# Patient Record
Sex: Male | Born: 1945 | Race: White | Hispanic: No | Marital: Married | Smoking: Former smoker
Health system: Southern US, Community
[De-identification: ages and names within clinical notes are randomized; demographics above are authoritative.]

## PROBLEM LIST (undated history)

## (undated) DIAGNOSIS — H409 Unspecified glaucoma: Secondary | ICD-10-CM

## (undated) DIAGNOSIS — Z5189 Encounter for other specified aftercare: Secondary | ICD-10-CM

## (undated) DIAGNOSIS — H9191 Unspecified hearing loss, right ear: Secondary | ICD-10-CM

## (undated) DIAGNOSIS — K219 Gastro-esophageal reflux disease without esophagitis: Secondary | ICD-10-CM

## (undated) DIAGNOSIS — M1990 Unspecified osteoarthritis, unspecified site: Secondary | ICD-10-CM

## (undated) DIAGNOSIS — M069 Rheumatoid arthritis, unspecified: Secondary | ICD-10-CM

## (undated) DIAGNOSIS — R413 Other amnesia: Secondary | ICD-10-CM

## (undated) DIAGNOSIS — W3400XA Accidental discharge from unspecified firearms or gun, initial encounter: Secondary | ICD-10-CM

## (undated) DIAGNOSIS — I10 Essential (primary) hypertension: Secondary | ICD-10-CM

## (undated) DIAGNOSIS — F419 Anxiety disorder, unspecified: Secondary | ICD-10-CM

## (undated) DIAGNOSIS — J61 Pneumoconiosis due to asbestos and other mineral fibers: Secondary | ICD-10-CM

## (undated) DIAGNOSIS — A150 Tuberculosis of lung: Secondary | ICD-10-CM

## (undated) DIAGNOSIS — E785 Hyperlipidemia, unspecified: Secondary | ICD-10-CM

## (undated) DIAGNOSIS — J45909 Unspecified asthma, uncomplicated: Secondary | ICD-10-CM

## (undated) DIAGNOSIS — J8410 Pulmonary fibrosis, unspecified: Secondary | ICD-10-CM

## (undated) DIAGNOSIS — C61 Malignant neoplasm of prostate: Secondary | ICD-10-CM

## (undated) HISTORY — DX: Unspecified asthma, uncomplicated: J45.909

## (undated) HISTORY — DX: Unspecified glaucoma: H40.9

## (undated) HISTORY — DX: Hyperlipidemia, unspecified: E78.5

## (undated) HISTORY — DX: Gastro-esophageal reflux disease without esophagitis: K21.9

## (undated) HISTORY — PX: PROSTATE BIOPSY: SHX241

## (undated) HISTORY — DX: Unspecified osteoarthritis, unspecified site: M19.90

## (undated) HISTORY — PX: TONSILLECTOMY: SUR1361

## (undated) HISTORY — DX: Malignant neoplasm of prostate: C61

## (undated) HISTORY — PX: ABDOMINAL SURGERY: SHX537

## (undated) HISTORY — DX: Encounter for other specified aftercare: Z51.89

## (undated) HISTORY — DX: Anxiety disorder, unspecified: F41.9

---

## 2006-04-25 DIAGNOSIS — A150 Tuberculosis of lung: Secondary | ICD-10-CM

## 2006-04-25 HISTORY — DX: Tuberculosis of lung: A15.0

## 2013-09-18 ENCOUNTER — Emergency Department (HOSPITAL_BASED_OUTPATIENT_CLINIC_OR_DEPARTMENT_OTHER)
Admission: EM | Admit: 2013-09-18 | Discharge: 2013-09-18 | Disposition: A | Discharge status: Home / Self Care | Payer: Medicare HMO | Attending: Emergency Medicine | Admitting: Emergency Medicine

## 2013-09-18 ENCOUNTER — Emergency Department (HOSPITAL_BASED_OUTPATIENT_CLINIC_OR_DEPARTMENT_OTHER): Payer: Medicare HMO

## 2013-09-18 ENCOUNTER — Encounter (HOSPITAL_BASED_OUTPATIENT_CLINIC_OR_DEPARTMENT_OTHER): Payer: Self-pay | Admitting: Emergency Medicine

## 2013-09-18 DIAGNOSIS — Z8709 Personal history of other diseases of the respiratory system: Secondary | ICD-10-CM | POA: Insufficient documentation

## 2013-09-18 DIAGNOSIS — Z8611 Personal history of tuberculosis: Secondary | ICD-10-CM | POA: Insufficient documentation

## 2013-09-18 DIAGNOSIS — I10 Essential (primary) hypertension: Secondary | ICD-10-CM

## 2013-09-18 DIAGNOSIS — H348122 Central retinal vein occlusion, left eye, stable: Secondary | ICD-10-CM

## 2013-09-18 DIAGNOSIS — H348192 Central retinal vein occlusion, unspecified eye, stable: Secondary | ICD-10-CM | POA: Insufficient documentation

## 2013-09-18 DIAGNOSIS — R0602 Shortness of breath: Secondary | ICD-10-CM | POA: Insufficient documentation

## 2013-09-18 DIAGNOSIS — Z87828 Personal history of other (healed) physical injury and trauma: Secondary | ICD-10-CM | POA: Insufficient documentation

## 2013-09-18 DIAGNOSIS — H547 Unspecified visual loss: Secondary | ICD-10-CM | POA: Insufficient documentation

## 2013-09-18 DIAGNOSIS — Z87891 Personal history of nicotine dependence: Secondary | ICD-10-CM | POA: Insufficient documentation

## 2013-09-18 DIAGNOSIS — H9190 Unspecified hearing loss, unspecified ear: Secondary | ICD-10-CM | POA: Insufficient documentation

## 2013-09-18 DIAGNOSIS — R51 Headache: Secondary | ICD-10-CM | POA: Insufficient documentation

## 2013-09-18 HISTORY — DX: Pneumoconiosis due to asbestos and other mineral fibers: J61

## 2013-09-18 HISTORY — DX: Essential (primary) hypertension: I10

## 2013-09-18 HISTORY — DX: Accidental discharge from unspecified firearms or gun, initial encounter: W34.00XA

## 2013-09-18 HISTORY — DX: Tuberculosis of lung: A15.0

## 2013-09-18 HISTORY — DX: Unspecified hearing loss, right ear: H91.91

## 2013-09-18 LAB — BASIC METABOLIC PANEL
BUN: 24 mg/dL — ABNORMAL HIGH (ref 6–23)
CALCIUM: 9.8 mg/dL (ref 8.4–10.5)
CO2: 24 mEq/L (ref 19–32)
Chloride: 101 mEq/L (ref 96–112)
Creatinine, Ser: 1.5 mg/dL — ABNORMAL HIGH (ref 0.50–1.35)
GFR calc Af Amer: 53 mL/min — ABNORMAL LOW (ref >90)
GFR calc non Af Amer: 46 mL/min — ABNORMAL LOW (ref >90)
Glucose, Bld: 100 mg/dL — ABNORMAL HIGH (ref 70–99)
Potassium: 3.6 mEq/L — ABNORMAL LOW (ref 3.7–5.3)
Sodium: 142 mEq/L (ref 137–147)

## 2013-09-18 LAB — CBC WITH DIFFERENTIAL/PLATELET
BASOS ABS: 0.1 10*3/uL (ref 0.0–0.1)
BASOS PCT: 1 % (ref 0–1)
Eosinophils Absolute: 0.4 10*3/uL (ref 0.0–0.7)
Eosinophils Relative: 4 % (ref 0–5)
HEMATOCRIT: 39.4 % (ref 39.0–52.0)
Hemoglobin: 13.9 g/dL (ref 13.0–17.0)
Lymphocytes Relative: 26 % (ref 12–46)
Lymphs Abs: 2.8 10*3/uL (ref 0.7–4.0)
MCH: 29.9 pg (ref 26.0–34.0)
MCHC: 35.3 g/dL (ref 30.0–36.0)
MCV: 84.7 fL (ref 78.0–100.0)
Monocytes Absolute: 0.8 10*3/uL (ref 0.1–1.0)
Monocytes Relative: 7 % (ref 3–12)
Neutro Abs: 6.9 10*3/uL (ref 1.7–7.7)
Neutrophils Relative %: 63 % (ref 43–77)
Platelets: 285 10*3/uL (ref 150–400)
RBC: 4.65 MIL/uL (ref 4.22–5.81)
RDW: 14.3 % (ref 11.5–15.5)
WBC: 10.9 10*3/uL — ABNORMAL HIGH (ref 4.0–10.5)

## 2013-09-18 MED ORDER — LISINOPRIL 10 MG PO TABS: 10.0000 mg | ORAL_TABLET | Freq: Every day | ORAL | Status: DC

## 2013-09-18 MED ORDER — LISINOPRIL 10 MG PO TABS
10.0000 mg | ORAL_TABLET | Freq: Once | ORAL | Status: AC
  Administered 2013-09-18: 10 mg via ORAL
  Filled 2013-09-18: qty 1

## 2013-09-18 MED ORDER — AMLODIPINE BESYLATE 10 MG PO TABS: 10.0000 mg | ORAL_TABLET | Freq: Every day | ORAL | Status: DC

## 2013-09-18 MED ORDER — TIMOLOL MALEATE 0.5 % OP SOLN
1.0000 [drp] | Freq: Two times a day (BID) | OPHTHALMIC | Status: DC
  Administered 2013-09-18: 1 [drp] via OPHTHALMIC
  Filled 2013-09-18: qty 5

## 2013-09-18 MED ORDER — TETRACAINE HCL 0.5 % OP SOLN
OPHTHALMIC | Status: DC
Start: 2013-09-18 — End: 2013-09-19
  Filled 2013-09-18: qty 2

## 2013-09-18 MED ORDER — AMLODIPINE BESYLATE 5 MG PO TABS
5.0000 mg | ORAL_TABLET | Freq: Once | ORAL | Status: AC
  Administered 2013-09-18: 5 mg via ORAL
  Filled 2013-09-18: qty 1
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
## 2013-09-18 NOTE — Discharge Instructions (Signed)
Arterial Hypertension °Arterial hypertension (high blood pressure) is a condition of elevated pressure in your blood vessels. Hypertension over a long period of time is a risk factor for strokes, heart attacks, and heart failure. It is also the leading cause of kidney (renal) failure.  °CAUSES  °· In Adults -- Over 90% of all hypertension has no known cause. This is called essential or primary hypertension. In the other 10% of people with hypertension, the increase in blood pressure is caused by another disorder. This is called secondary hypertension. Important causes of secondary hypertension are: °· Heavy alcohol use. °· Obstructive sleep apnea. °· Hyperaldosterosim (Conn's syndrome). °· Steroid use. °· Chronic kidney failure. °· Hyperparathyroidism. °· Medications. °· Renal artery stenosis. °· Pheochromocytoma. °· Cushing's disease. °· Coarctation of the aorta. °· Scleroderma renal crisis. °· Licorice (in excessive amounts). °· Drugs (cocaine, methamphetamine). °Your caregiver can explain any items above that apply to you. °· In Children -- Secondary hypertension is more common and should always be considered. °· Pregnancy -- Few women of childbearing age have high blood pressure. However, up to 10% of them develop hypertension of pregnancy. Generally, this will not harm the woman. It may be a sign of 3 complications of pregnancy: preeclampsia, HELLP syndrome, and eclampsia. Follow up and control with medication is necessary. °SYMPTOMS  °· This condition normally does not produce any noticeable symptoms. It is usually found during a routine exam. °· Malignant hypertension is a late problem of high blood pressure. It may have the following symptoms: °· Headaches. °· Blurred vision. °· End-organ damage (this means your kidneys, heart, lungs, and other organs are being damaged). °· Stressful situations can increase the blood pressure. If a person with normal blood pressure has their blood pressure go up while being  seen by their caregiver, this is often termed "white coat hypertension." Its importance is not known. It may be related with eventually developing hypertension or complications of hypertension. °· Hypertension is often confused with mental tension, stress, and anxiety. °DIAGNOSIS  °The diagnosis is made by 3 separate blood pressure measurements. They are taken at least 1 week apart from each other. If there is organ damage from hypertension, the diagnosis may be made without repeat measurements. °Hypertension is usually identified by having blood pressure readings: °· Above 140/90 mmHg measured in both arms, at 3 separate times, over a couple weeks. °· Over 130/80 mmHg should be considered a risk factor and may require treatment in patients with diabetes. °Blood pressure readings over 120/80 mmHg are called "pre-hypertension" even in non-diabetic patients. °To get a true blood pressure measurement, use the following guidelines. Be aware of the factors that can alter blood pressure readings. °· Take measurements at least 1 hour after caffeine. °· Take measurements 30 minutes after smoking and without any stress. This is another reason to quit smoking  it raises your blood pressure. °· Use a proper cuff size. Ask your caregiver if you are not sure about your cuff size. °· Most home blood pressure cuffs are automatic. They will measure systolic and diastolic pressures. The systolic pressure is the pressure reading at the start of sounds. Diastolic pressure is the pressure at which the sounds disappear. If you are elderly, measure pressures in multiple postures. Try sitting, lying or standing. °· Sit at rest for a minimum of 5 minutes before taking measurements. °· You should not be on any medications like decongestants. These are found in many cold medications. °· Record your blood pressure readings and review   them with your caregiver. °If you have hypertension: °· Your caregiver may do tests to be sure you do not have  secondary hypertension (see "causes" above). °· Your caregiver may also look for signs of metabolic syndrome. This is also called Syndrome X or Insulin Resistance Syndrome. You may have this syndrome if you have type 2 diabetes, abdominal obesity, and abnormal blood lipids in addition to hypertension. °· Your caregiver will take your medical and family history and perform a physical exam. °· Diagnostic tests may include blood tests (for glucose, cholesterol, potassium, and kidney function), a urinalysis, or an EKG. Other tests may also be necessary depending on your condition. °PREVENTION  °There are important lifestyle issues that you can adopt to reduce your chance of developing hypertension: °· Maintain a normal weight. °· Limit the amount of salt (sodium) in your diet. °· Exercise often. °· Limit alcohol intake. °· Get enough potassium in your diet. Discuss specific advice with your caregiver. °· Follow a DASH diet (dietary approaches to stop hypertension). This diet is rich in fruits, vegetables, and low-fat dairy products, and avoids certain fats. °PROGNOSIS  °Essential hypertension cannot be cured. Lifestyle changes and medical treatment can lower blood pressure and reduce complications. The prognosis of secondary hypertension depends on the underlying cause. Many people whose hypertension is controlled with medicine or lifestyle changes can live a normal, healthy life.  °RISKS AND COMPLICATIONS  °While high blood pressure alone is not an illness, it often requires treatment due to its short- and long-term effects on many organs. Hypertension increases your risk for: °· CVAs or strokes (cerebrovascular accident). °· Heart failure due to chronically high blood pressure (hypertensive cardiomyopathy). °· Heart attack (myocardial infarction). °· Damage to the retina (hypertensive retinopathy). °· Kidney failure (hypertensive nephropathy). °Your caregiver can explain list items above that apply to you. Treatment  of hypertension can significantly reduce the risk of complications. °TREATMENT  °· For overweight patients, weight loss and regular exercise are recommended. Physical fitness lowers blood pressure. °· Mild hypertension is usually treated with diet and exercise. A diet rich in fruits and vegetables, fat-free dairy products, and foods low in fat and salt (sodium) can help lower blood pressure. Decreasing salt intake decreases blood pressure in a 1/3 of people. °· Stop smoking if you are a smoker. °The steps above are highly effective in reducing blood pressure. While these actions are easy to suggest, they are difficult to achieve. Most patients with moderate or severe hypertension end up requiring medications to bring their blood pressure down to a normal level. There are several classes of medications for treatment. Blood pressure pills (antihypertensives) will lower blood pressure by their different actions. Lowering the blood pressure by 10 mmHg may decrease the risk of complications by as much as 25%. °The goal of treatment is effective blood pressure control. This will reduce your risk for complications. Your caregiver will help you determine the best treatment for you according to your lifestyle. What is excellent treatment for one person, may not be for you. °HOME CARE INSTRUCTIONS  °· Do not smoke. °· Follow the lifestyle changes outlined in the "Prevention" section. °· If you are on medications, follow the directions carefully. Blood pressure medications must be taken as prescribed. Skipping doses reduces their benefit. It also puts you at risk for problems. °· Follow up with your caregiver, as directed. °· If you are asked to monitor your blood pressure at home, follow the guidelines in the "Diagnosis" section above. °SEEK MEDICAL CARE   IF:   You think you are having medication side effects.  You have recurrent headaches or lightheadedness.  You have swelling in your ankles.  You have trouble with  your vision. SEEK IMMEDIATE MEDICAL CARE IF:   You have sudden onset of chest pain or pressure, difficulty breathing, or other symptoms of a heart attack.  You have a severe headache.  You have symptoms of a stroke (such as sudden weakness, difficulty speaking, difficulty walking). MAKE SURE YOU:   Understand these instructions.  Will watch your condition.  Will get help right away if you are not doing well or get worse. Document Released: 04/11/2005 Document Revised: 07/04/2011 Document Reviewed: 11/09/2006 [facility] Patient Information 2014 [place].  Central Retinal Vein Occlusion Central retinal vein occlusion is when the blood supply from the retina is blocked. The retina is the layer of cells at the back of the eye. It enables [place] to see light and color. CAUSES The blockage of this vein is often a complication of:   Glaucoma.  Diabetes mellitus.  High blood pressure (hypertension).  Certain blood disorders. There are many possible reasons for the central retinal vein to become blocked. However, on many occasions, a specific cause is not identified. SYMPTOMS   If the central retinal vein is completely blocked, it causes a gradual, painless, complete or partial loss of vision in one eye.  If only a branch of the vein is blocked, there is just a partial loss of vision. This may show up as a loss of a quarter of a field of vision (quadrant). This type of blockage is referred to as a "branch retinal vein occlusion." TREATMENT  There is no generally accepted medical treatment for this condition. Usually, people with normal retinal vessels and blood supply do well. Sometimes, new retinal vessels will begin to grow in the injured area. When this happens, laser treatments may be helpful. They can decrease bleeding (hemorrhages) or prevent new vessel formation (neovascular), glaucoma and bleeding into the inside of the eye. HOME CARE INSTRUCTIONS  There is no treatment or  change in activities. However, your caregiver may want you to be evaluated for high blood pressure or other medical conditions. If any are present, they may require that you take medications or other treatment. SEEK MEDICAL CARE IF:   You notice any change in your vision.  You have loss of vision off to the side in one eye. SEEK IMMEDIATE MEDICAL CARE IF:   For any sudden change in vision. Document Released: 07/24/2000 Document Revised: 07/04/2011 Document Reviewed: 08/07/2008 [facility] Patient Information 2014 [place], [place].

## 2013-09-18 NOTE — ED Notes (Signed)
Headaches since last week.  Checked bp at home today by family member and it was 279/143. Sts he has been off of his bp med for 1 1/2 years due to loss of insurance.

## 2013-09-18 NOTE — ED Notes (Signed)
Pt states that he forgot to tell the triage nurse that 2 weeks ago he experienced a sudden blindness in his left eye. Pt states he has no vision in his left eye, pupil is non reactive. Pt states he did not mention this problem to anyone until today.

## 2013-09-24 ENCOUNTER — Ambulatory Visit (INDEPENDENT_AMBULATORY_CARE_PROVIDER_SITE_OTHER): Payer: Medicare HMO | Admitting: Physician Assistant

## 2013-09-24 ENCOUNTER — Encounter: Payer: Self-pay | Admitting: Physician Assistant

## 2013-09-24 VITALS — BP 160/104 | HR 73 | Temp 97.7°F | Ht 67.5 in | Wt 164.0 lb

## 2013-09-24 DIAGNOSIS — H409 Unspecified glaucoma: Secondary | ICD-10-CM

## 2013-09-24 DIAGNOSIS — I10 Essential (primary) hypertension: Secondary | ICD-10-CM

## 2013-09-24 DIAGNOSIS — Z1211 Encounter for screening for malignant neoplasm of colon: Secondary | ICD-10-CM

## 2013-09-24 DIAGNOSIS — R351 Nocturia: Secondary | ICD-10-CM

## 2013-09-24 LAB — POCT URINALYSIS DIPSTICK
BILIRUBIN UA: NEGATIVE
Glucose, UA: NEGATIVE
Ketones, UA: NEGATIVE
LEUKOCYTES UA: NEGATIVE
NITRITE UA: NEGATIVE
PH UA: 6.5
Spec Grav, UA: <=1.005
UROBILINOGEN UA: NEGATIVE

## 2013-09-24 MED ORDER — LISINOPRIL 20 MG PO TABS: 20.0000 mg | ORAL_TABLET | Freq: Every day | ORAL | Status: DC

## 2013-09-24 MED ORDER — LISINOPRIL 20 MG PO TABS: 10.0000 mg | ORAL_TABLET | Freq: Every day | ORAL | Status: DC
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
## 2013-09-24 NOTE — Patient Instructions (Addendum)
Please increase lisinopril to 20 mg daily.  This will be two tablets of your current 10 mg prescription.  Once you pick up the new prescription, you will take 1 20 mg tablet daily.  Follow-up in 2 weeks for Medicare Wellness Exam.  Come fasting at that visit so we can obtain blood work. Try using Saw Palmetto to help with nighttime urinary symptoms.

## 2013-09-24 NOTE — Progress Notes (Signed)
Pre visit review using our clinic review tool, if applicable. No additional management support is needed unless otherwise documented below in the visit note. 

## 2013-09-25 ENCOUNTER — Telehealth: Payer: Self-pay | Admitting: Physician Assistant

## 2013-09-25 LAB — URINALYSIS, MICROSCOPIC ONLY
BACTERIA UA: NONE SEEN
CASTS: NONE SEEN
Crystals: NONE SEEN
Squamous Epithelial / LPF: NONE SEEN

## 2013-09-25 NOTE — Telephone Encounter (Signed)
Relevant patient education mailed to patient.  

## 2013-09-26 LAB — CULTURE, URINE COMPREHENSIVE
COLONY COUNT: NO GROWTH
Organism ID, Bacteria: NO GROWTH

## 2013-09-27 DIAGNOSIS — Z1211 Encounter for screening for malignant neoplasm of colon: Secondary | ICD-10-CM | POA: Insufficient documentation

## 2013-09-27 DIAGNOSIS — R351 Nocturia: Secondary | ICD-10-CM | POA: Insufficient documentation

## 2013-09-27 DIAGNOSIS — H409 Unspecified glaucoma: Secondary | ICD-10-CM | POA: Insufficient documentation

## 2013-09-27 DIAGNOSIS — I10 Essential (primary) hypertension: Secondary | ICD-10-CM | POA: Insufficient documentation

## 2013-09-27 NOTE — Assessment & Plan Note (Signed)
Continue Timolol OP. Referral placed to Ophthalmology.

## 2013-09-27 NOTE — Assessment & Plan Note (Signed)
Continue current dose of HCTZ. Increase lisinopril to 20 mg daily.  DASH handout given.  Follow-up 2 weeks for BP recheck and for Medicare Wellness with fasting labs.

## 2013-09-27 NOTE — Assessment & Plan Note (Signed)
Urine dip reveals trace hgb.  Negative for LE or nitirites.  Will send for micro and culture. Patient is returning for [facility].  Will obtain fasting labs to include PSA at that time.  Encourage use of Saw Palmetto to help with symptoms until workup is complete.

## 2013-09-27 NOTE — Assessment & Plan Note (Addendum)
Overdue for repeat screening. Endorses last colonoscopy in 2011 with polyps noted.  Was told to return in 3 years.  Referral placed to [facility]I for screening colonoscopy.

## 2013-10-09 ENCOUNTER — Ambulatory Visit: Payer: Medicare HMO | Admitting: Physician Assistant

## 2013-10-17 ENCOUNTER — Ambulatory Visit (INDEPENDENT_AMBULATORY_CARE_PROVIDER_SITE_OTHER): Payer: Medicare HMO | Admitting: Physician Assistant

## 2013-10-17 ENCOUNTER — Encounter: Payer: Self-pay | Admitting: Physician Assistant

## 2013-10-17 VITALS — BP 124/74 | HR 74 | Temp 98.2°F | Resp 18 | Ht 67.5 in | Wt 163.8 lb

## 2013-10-17 DIAGNOSIS — H9193 Unspecified hearing loss, bilateral: Secondary | ICD-10-CM

## 2013-10-17 DIAGNOSIS — N400 Enlarged prostate without lower urinary tract symptoms: Secondary | ICD-10-CM

## 2013-10-17 DIAGNOSIS — Z136 Encounter for screening for cardiovascular disorders: Secondary | ICD-10-CM

## 2013-10-17 DIAGNOSIS — F329 Major depressive disorder, single episode, unspecified: Secondary | ICD-10-CM

## 2013-10-17 DIAGNOSIS — Z0000 Encounter for general adult medical examination without abnormal findings: Secondary | ICD-10-CM

## 2013-10-17 DIAGNOSIS — I252 Old myocardial infarction: Secondary | ICD-10-CM

## 2013-10-17 DIAGNOSIS — F3289 Other specified depressive episodes: Secondary | ICD-10-CM

## 2013-10-17 DIAGNOSIS — H9190 Unspecified hearing loss, unspecified ear: Secondary | ICD-10-CM

## 2013-10-17 DIAGNOSIS — I10 Essential (primary) hypertension: Secondary | ICD-10-CM

## 2013-10-17 DIAGNOSIS — F32A Depression, unspecified: Secondary | ICD-10-CM

## 2013-10-17 DIAGNOSIS — R972 Elevated prostate specific antigen [PSA]: Secondary | ICD-10-CM

## 2013-10-17 LAB — LIPID PANEL
CHOL/HDL RATIO: 6.6 ratio
Cholesterol: 225 mg/dL — ABNORMAL HIGH (ref 0–200)
HDL: 34 mg/dL — AB (ref >39)
LDL CALC: 143 mg/dL — AB (ref 0–99)
Triglycerides: 238 mg/dL — ABNORMAL HIGH (ref <150)
VLDL: 48 mg/dL — ABNORMAL HIGH (ref 0–40)

## 2013-10-17 LAB — CBC
HCT: 39.3 % (ref 39.0–52.0)
HEMOGLOBIN: 14 g/dL (ref 13.0–17.0)
MCH: 29.7 pg (ref 26.0–34.0)
MCHC: 35.6 g/dL (ref 30.0–36.0)
MCV: 83.4 fL (ref 78.0–100.0)
PLATELETS: 363 10*3/uL (ref 150–400)
RBC: 4.71 MIL/uL (ref 4.22–5.81)
RDW: 15.6 % — AB (ref 11.5–15.5)
WBC: 10.3 10*3/uL (ref 4.0–10.5)

## 2013-10-17 LAB — COMPREHENSIVE METABOLIC PANEL
ALBUMIN: 4.4 g/dL (ref 3.5–5.2)
ALT: 13 U/L (ref 0–53)
AST: 12 U/L (ref 0–37)
Alkaline Phosphatase: 57 U/L (ref 39–117)
BILIRUBIN TOTAL: 0.5 mg/dL (ref 0.2–1.2)
BUN: 23 mg/dL (ref 6–23)
CO2: 28 mEq/L (ref 19–32)
Calcium: 9.8 mg/dL (ref 8.4–10.5)
Chloride: 103 mEq/L (ref 96–112)
Creat: 1.5 mg/dL — ABNORMAL HIGH (ref 0.50–1.35)
GLUCOSE: 94 mg/dL (ref 70–99)
POTASSIUM: 4.3 meq/L (ref 3.5–5.3)
SODIUM: 141 meq/L (ref 135–145)
TOTAL PROTEIN: 7.7 g/dL (ref 6.0–8.3)

## 2013-10-17 MED ORDER — LISINOPRIL-HYDROCHLOROTHIAZIDE 10-12.5 MG PO TABS
1.0000 | ORAL_TABLET | Freq: Every day | ORAL | Status: DC
Start: 2013-10-17 — End: 2013-11-20

## 2013-10-17 MED ORDER — ASPIRIN EC 81 MG PO TBEC: 81.0000 mg | DELAYED_RELEASE_TABLET | Freq: Every day | ORAL | Status: DC

## 2013-10-17 NOTE — Progress Notes (Signed)
Subjective:   Patient here for Medicare annual wellness visit and management of other chronic and acute problems.   Risk factors: Age, Hypertension, Hyperlipidemia  Roster of Physicians Providing Medical Care to Patient: [name], PA-C -- Family Medicine Dr. Phineas Real -- Ophthalmology  Activities of Daily Living  In your present state of health, do you have any difficulty performing the following activities? Preparing food and eating?: No  Bathing yourself: No  Getting dressed: No  Using the toilet:No  Moving around from place to place: No  In the past year have you fallen or had a near fall?:No     Home Safety: Has smoke detector and wears seat belts. No firearms. No excess sun exposure.  Diet and Exercise  Current exercise habits: walks 15 mins 1 x per week. Mow grass.  Dietary issues discussed: healthy diet   Depression Screen  (Note: if answer to either of the following is "Yes", then a more complete depression screening is indicated)  Q1: Over the past two weeks, have you felt down, depressed or hopeless? Feels worried about mother. Denies SI/HI.  Q2: Over the past two weeks, have you felt little interest or pleasure in doing things? no   The following portions of the patient's history were reviewed and updated as appropriate: allergies, current medications, past family history, past medical history, past social history, past surgical history and problem list.    Review of Systems  Review of Systems  Constitutional: Negative for fever and weight loss.  HENT: Negative for ear discharge, ear pain, hearing loss and tinnitus.   Eyes: Positive for blurred vision. Negative for double vision, photophobia and pain.  Respiratory: Negative for cough and shortness of breath.   Cardiovascular: Negative for chest pain and palpitations.  Gastrointestinal: Negative.   Genitourinary: Negative.   Neurological: Negative for dizziness, loss of consciousness and headaches.   Endo/Heme/Allergies: Negative for environmental allergies.  Psychiatric/Behavioral: Negative.     Objective:   Vision: L - legally blind; R - 20/30; B - 20/25 Hearing: Patient cannot hear forced whisper.  Will need formal audiology evaluation. Body mass index: Body mass index is 25.25 kg/(m^2). Cognitive Impairment Assessment: cognition, memory and judgment appear normal.   Physical Exam  Vitals reviewed. Constitutional: He is oriented to person, place, and time and well-developed, well-nourished, and in no distress.  HENT:  Head: Normocephalic and atraumatic.  Right Ear: External ear normal.  Left Ear: External ear normal.  Nose: Nose normal.  Mouth/Throat: Oropharynx is clear and moist. No oropharyngeal exudate.  TM within normal limits bilaterally.  Eyes: Conjunctivae are normal. Pupils are equal, round, and reactive to light.  Neck: Neck supple. No thyromegaly present.  Cardiovascular: Normal rate, regular rhythm, normal heart sounds and intact distal pulses.   Pulmonary/Chest: Effort normal and breath sounds normal. No respiratory distress. He has no wheezes. He has no rales. He exhibits no tenderness.  Abdominal: Soft. Bowel sounds are normal. He exhibits no distension and no mass. There is no tenderness. There is no rebound and no guarding.  Lymphadenopathy:    He has no cervical adenopathy.  Neurological: He is alert and oriented to person, place, and time.  Skin: Skin is warm and dry. No rash noted.  Psychiatric: Affect normal.    Assessment:   (1)Medicare wellness utd on preventive parameters (2)Hypertension (3)Hyperlipidemia (4)Glaucoma (5)Abnormal EKG  Plan:   (1) During the course of the visit the patient was educated and counseled about appropriate screening and preventive services including: Fall  prevention, Diabetes screening, Nutrition counseling.  Will obtain fasting labs to include PSA (2) Continue Prinzide and amlodipine. DASH diet. (3) Continue current  regimen.  Will obtain lipid panel and CMP. 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  Asymptomatic.  BP and lipids controlled.  Start ASA 81 mg. Referral placed to Cardiology.  Patient is aware of when to proceed to ER if indicated.   Patient Instructions (the written plan) was given to the patient.

## 2013-10-17 NOTE — Patient Instructions (Signed)
Please obtain labs.  I will call you with your results. Please take medications as directed.  Start a [employer] daily for prostate health. Make sure to take your baby aspirin.  You will be contacted by Cardiology for an appointment. You need to call GI for your colonoscopy as they have been trying to contact you.  The number is [phone].  Follow-up in 2 weeks for blood pressure recheck.  Preventive Care for Adults A healthy lifestyle and preventive care can promote health and wellness. Preventive health guidelines for men include the following key practices:  A routine yearly physical is a good way to check with your health care Antoinett Dorman about your health and preventative screening. It is a chance to share any concerns and updates on your health and to receive a thorough exam.  Visit your dentist for a routine exam and preventative care every 6 months. Brush your teeth twice a day and floss once a day. Good oral hygiene prevents tooth decay and gum disease.  The frequency of eye exams is based on your age, health, family medical history, use of contact lenses, and other factors. Follow your health care Shaketta Rill's recommendations for frequency of eye exams.  Eat a healthy diet. Foods such as vegetables, fruits, whole grains, low-fat dairy products, and lean protein foods contain the nutrients you need without too many calories. Decrease your intake of foods high in solid fats, added sugars, and salt. Eat the right amount of calories for you.Get information about a proper diet from your health care Aleaha Fickling, if necessary.  Regular physical exercise is one of the most important things you can do for your health. Most adults should get at least 150 minutes of moderate-intensity exercise (any activity that increases your heart rate and causes you to sweat) each week. In addition, most adults need muscle-strengthening exercises on 2 or more days a week.  Maintain a healthy weight. The body mass  index (BMI) is a screening tool to identify possible weight problems. It provides an estimate of body fat based on height and weight. Your health care Tytianna Greenley can find your BMI and can help you achieve or maintain a healthy weight.For adults 20 years and older:  A BMI below 18.5 is considered underweight.  A BMI of 18.5 to 24.9 is normal.  A BMI of 25 to 29.9 is considered overweight.  A BMI of 30 and above is considered obese.  Maintain normal blood lipids and cholesterol levels by exercising and minimizing your intake of saturated fat. Eat a balanced diet with plenty of fruit and vegetables. Blood tests for lipids and cholesterol should begin at age 69 and be repeated every 5 years. If your lipid or cholesterol levels are high, you are over 50, or you are at high risk for heart disease, you may need your cholesterol levels checked more frequently.Ongoing high lipid and cholesterol levels should be treated with medicines if diet and exercise are not working.  If you smoke, find out from your health care Jamaiya Tunnell how to quit. If you do not use tobacco, do not start.  Lung cancer screening is recommended for adults aged 19-80 years who are at high risk for developing lung cancer because of a history of smoking. A yearly low-dose CT scan of the lungs is recommended for people who have at least a 30-pack-year history of smoking and are a current smoker or have quit within the past 15 years. A pack year of smoking is smoking an average of 1  pack of cigarettes a day for 1 year (for example: 1 pack a day for 30 years or 2 packs a day for 15 years). Yearly screening should continue until the smoker has stopped smoking for at least 15 years. Yearly screening should be stopped for people who develop a health problem that would prevent them from having lung cancer treatment.  If you choose to drink alcohol, do not have more than 2 drinks per day. One drink is considered to be 12 ounces (355 mL) of beer, 5  ounces (148 mL) of wine, or 1.5 ounces (44 mL) of liquor.  Avoid use of street drugs. Do not share needles with anyone. Ask for help if you need support or instructions about stopping the use of drugs.  High blood pressure causes heart disease and increases the risk of stroke. Your blood pressure should be checked at least every 1-2 years. Ongoing high blood pressure should be treated with medicines, if weight loss and exercise are not effective.  If you are 50-50 years old, ask your health care Mashanda Ishibashi if you should take aspirin to prevent heart disease.  Diabetes screening involves taking a blood sample to check your fasting blood sugar level. This should be done once every 3 years, after age 6, if you are within normal weight and without risk factors for diabetes. Testing should be considered at a younger age or be carried out more frequently if you are overweight and have at least 1 risk factor for diabetes.  Colorectal cancer can be detected and often prevented. Most routine colorectal cancer screening begins at the age of 45 and continues through age 37. However, your health care Evamaria Detore may recommend screening at an earlier age if you have risk factors for colon cancer. On a yearly basis, your health care Lindsey Hommel may provide home test kits to check for hidden blood in the stool. Use of a small camera at the end of a tube to directly examine the colon (sigmoidoscopy or colonoscopy) can detect the earliest forms of colorectal cancer. Talk to your health care Ranveer Wahlstrom about this at age 55, when routine screening begins. Direct exam of the colon should be repeated every 5-10 years through age 13, unless early forms of precancerous polyps or small growths are found.  People who are at an increased risk for hepatitis B should be screened for this virus. You are considered at high risk for hepatitis B if:  You were born in a country where hepatitis B occurs often. Talk with your health care  Anavey Coombes about which countries are considered high risk.  Your parents were born in a high-risk country and you have not received a shot to protect against hepatitis B (hepatitis B vaccine).  You have HIV or AIDS.  You use needles to inject street drugs.  You live with, or have sex with, someone who has hepatitis B.  You are a man who has sex with other men (MSM).  You get hemodialysis treatment.  You take certain medicines for conditions such as cancer, organ transplantation, and autoimmune conditions.  Hepatitis C blood testing is recommended for all people born from [birth year] through 1965 and any individual with known risks for hepatitis C.  Practice safe sex. Use condoms and avoid high-risk sexual practices to reduce the spread of sexually transmitted infections (STIs). STIs include gonorrhea, chlamydia, syphilis, trichomonas, herpes, HPV, and human immunodeficiency virus (HIV). Herpes, HIV, and HPV are viral illnesses that have no cure. They can result in disability, cancer,  and death.  If you are at risk of being infected with HIV, it is recommended that you take a prescription medicine daily to prevent HIV infection. This is called preexposure prophylaxis (PrEP). You are considered at risk if:  You are a man who has sex with other men (MSM) and have other risk factors.  You are a heterosexual man, are sexually active, and are at increased risk for HIV infection.  You take drugs by injection.  You are sexually active with a partner who has HIV.  Talk with your health care Sreenidhi Ganson about whether you are at high risk of being infected with HIV. If you choose to begin PrEP, you should first be tested for HIV. You should then be tested every 3 months for as long as you are taking PrEP.  A one-time screening for abdominal aortic aneurysm (AAA) and surgical repair of large AAAs by ultrasound are recommended for men ages 54 to 23 years who are current or former smokers.  Healthy men  should no longer receive prostate-specific antigen (PSA) blood tests as part of routine cancer screening. Talk with your health care Jalyah Weinheimer about prostate cancer screening.  Testicular cancer screening is not recommended for adult males who have no symptoms. Screening includes self-exam, a health care Chermaine Schnyder exam, and other screening tests. Consult with your health care Alic Hilburn about any symptoms you have or any concerns you have about testicular cancer.  Use sunscreen. Apply sunscreen liberally and repeatedly throughout the day. You should seek shade when your shadow is shorter than you. Protect yourself by wearing long sleeves, pants, a wide-brimmed hat, and sunglasses year round, whenever you are outdoors.  Once a month, do a whole-body skin exam, using a mirror to look at the skin on your back. Tell your health care Armel Rabbani about new moles, moles that have irregular borders, moles that are larger than a pencil eraser, or moles that have changed in shape or color.  Stay current with required vaccines (immunizations).  Influenza vaccine. All adults should be immunized every year.  Tetanus, diphtheria, and acellular pertussis (Td, Tdap) vaccine. An adult who has not previously received Tdap or who does not know his vaccine status should receive 1 dose of Tdap. This initial dose should be followed by tetanus and diphtheria toxoids (Td) booster doses every 10 years. Adults with an unknown or incomplete history of completing a 3-dose immunization series with Td-containing vaccines should begin or complete a primary immunization series including a Tdap dose. Adults should receive a Td booster every 10 years.  Varicella vaccine. An adult without evidence of immunity to varicella should receive 2 doses or a second dose if he has previously received 1 dose.  Human papillomavirus (HPV) vaccine. Males aged 38-21 years who have not received the vaccine previously should receive the 3-dose series. Males  aged 22-26 years may be immunized. Immunization is recommended through the age of 91 years for any male who has sex with males and did not get any or all doses earlier. Immunization is recommended for any person with an immunocompromised condition through the age of 42 years if he did not get any or all doses earlier. During the 3-dose series, the second dose should be obtained 4-8 weeks after the first dose. The third dose should be obtained 24 weeks after the first dose and 16 weeks after the second dose.  Zoster vaccine. One dose is recommended for adults aged 19 years or older unless certain conditions are present.  Measles, mumps,  and rubella (MMR) vaccine. Adults born before [birth year] generally are considered immune to measles and mumps. Adults born in [birth year] or later should have 1 or more doses of MMR vaccine unless there is a contraindication to the vaccine or there is laboratory evidence of immunity to each of the three diseases. A routine second dose of MMR vaccine should be obtained at least 28 days after the first dose for students attending postsecondary schools, health care workers, or international travelers. People who received inactivated measles vaccine or an unknown type of measles vaccine during 1963-1967 should receive 2 doses of MMR vaccine. People who received inactivated mumps vaccine or an unknown type of mumps vaccine before 1979 and are at high risk for mumps infection should consider immunization with 2 doses of MMR vaccine. Unvaccinated health care workers born before [birth year] who lack laboratory evidence of measles, mumps, or rubella immunity or laboratory confirmation of disease should consider measles and mumps immunization with 2 doses of MMR vaccine or rubella immunization with 1 dose of MMR vaccine.  Pneumococcal 13-valent conjugate (PCV13) vaccine. When indicated, a person who is uncertain of his immunization history and has no record of immunization should receive the PCV13 vaccine.  An adult aged 52 years or older who has certain medical conditions and has not been previously immunized should receive 1 dose of PCV13 vaccine. This PCV13 should be followed with a dose of pneumococcal polysaccharide (PPSV23) vaccine. The PPSV23 vaccine dose should be obtained at least 8 weeks after the dose of PCV13 vaccine. An adult aged 32 years or older who has certain medical conditions and previously received 1 or more doses of PPSV23 vaccine should receive 1 dose of PCV13. The PCV13 vaccine dose should be obtained 1 or more years after the last PPSV23 vaccine dose.  Pneumococcal polysaccharide (PPSV23) vaccine. When PCV13 is also indicated, PCV13 should be obtained first. All adults aged 49 years and older should be immunized. An adult younger than age 29 years who has certain medical conditions should be immunized. Any person who resides in a nursing home or long-term care facility should be immunized. An adult smoker should be immunized. People with an immunocompromised condition and certain other conditions should receive both PCV13 and PPSV23 vaccines. People with human immunodeficiency virus (HIV) infection should be immunized as soon as possible after diagnosis. Immunization during chemotherapy or radiation therapy should be avoided. Routine use of PPSV23 vaccine is not recommended for American Indians, [place] Natives, or people younger than 65 years unless there are medical conditions that require PPSV23 vaccine. When indicated, people who have unknown immunization and have no record of immunization should receive PPSV23 vaccine. One-time revaccination 5 years after the first dose of PPSV23 is recommended for people aged 19-64 years who have chronic kidney failure, nephrotic syndrome, asplenia, or immunocompromised conditions. People who received 1-2 doses of PPSV23 before age 63 years should receive another dose of PPSV23 vaccine at age 33 years or later if at least 5 years have passed since the  previous dose. Doses of PPSV23 are not needed for people immunized with PPSV23 at or after age 79 years.  Meningococcal vaccine. Adults with asplenia or persistent complement component deficiencies should receive 2 doses of quadrivalent meningococcal conjugate (MenACWY-D) vaccine. The doses should be obtained at least 2 months apart. Microbiologists working with certain meningococcal bacteria, [place] recruits, people at risk during an outbreak, and people who travel to or live in countries with a high rate of meningitis should be immunized. A first-year  college student up through age 44 years who is living in a residence hall should receive a dose if he did not receive a dose on or after his 16th birthday. Adults who have certain high-risk conditions should receive one or more doses of vaccine.  Hepatitis A vaccine. Adults who wish to be protected from this disease, have certain high-risk conditions, work with hepatitis A-infected animals, work in hepatitis A research labs, or travel to or work in countries with a high rate of hepatitis A should be immunized. Adults who were previously unvaccinated and who anticipate close contact with an international adoptee during the first 60 days after arrival in the [place] from a country with a high rate of hepatitis A should be immunized.  Hepatitis B vaccine. Adults should be immunized if they wish to be protected from this disease, have certain high-risk conditions, may be exposed to blood or other infectious body fluids, are household contacts or sex partners of hepatitis B positive people, are clients or workers in certain care facilities, or travel to or work in countries with a high rate of hepatitis B.  Haemophilus influenzae type b (Hib) vaccine. A previously unvaccinated person with asplenia or sickle cell disease or having a scheduled splenectomy should receive 1 dose of Hib vaccine. Regardless of previous immunization, a recipient of a  hematopoietic stem cell transplant should receive a 3-dose series 6-12 months after his successful transplant. Hib vaccine is not recommended for adults with HIV infection. Preventive Service / Frequency Ages 75 to 71  Blood pressure check.** / Every 1 to 2 years.  Lipid and cholesterol check.** / Every 5 years beginning at age 27.  Hepatitis C blood test.** / For any individual with known risks for hepatitis C.  Skin self-exam. / Monthly.  Influenza vaccine. / Every year.  Tetanus, diphtheria, and acellular pertussis (Tdap, Td) vaccine.** / Consult your health care Kordae Buonocore. 1 dose of Td every 10 years.  Varicella vaccine.** / Consult your health care Denis Carreon.  HPV vaccine. / 3 doses over 6 months, if 24 or younger.  Measles, mumps, rubella (MMR) vaccine.** / You need at least 1 dose of MMR if you were born in 1957 or later. You may also need a second dose.  Pneumococcal 13-valent conjugate (PCV13) vaccine.** / Consult your health care Louetta Hollingshead.  Pneumococcal polysaccharide (PPSV23) vaccine.** / 1 to 2 doses if you smoke cigarettes or if you have certain conditions.  Meningococcal vaccine.** / 1 dose if you are age 53 to 97 years and a Market researcher living in a residence hall, or have one of several medical conditions. You may also need additional booster doses.  Hepatitis A vaccine.** / Consult your health care Zaray Gatchel.  Hepatitis B vaccine.** / Consult your health care Nysha Koplin.  Haemophilus influenzae type b (Hib) vaccine.** / Consult your health care Tyja Gortney. Ages 94 to 42  Blood pressure check.** / Every 1 to 2 years.  Lipid and cholesterol check.** / Every 5 years beginning at age 66.  Lung cancer screening. / Every year if you are aged 84-80 years and have a 30-pack-year history of smoking and currently smoke or have quit within the past 15 years. Yearly screening is stopped once you have quit smoking for at least 15 years or develop a health problem  that would prevent you from having lung cancer treatment.  Fecal occult blood test (FOBT) of stool. / Every year beginning at age 55 and continuing until age 39. You may not have  to do this test if you get a colonoscopy every 10 years.  Flexible sigmoidoscopy** or colonoscopy.** / Every 5 years for a flexible sigmoidoscopy or every 10 years for a colonoscopy beginning at age 38 and continuing until age 66.  Hepatitis C blood test.** / For all people born from [birth year] through 1966 and any individual with known risks for hepatitis C.  Skin self-exam. / Monthly.  Influenza vaccine. / Every year.  Tetanus, diphtheria, and acellular pertussis (Tdap/Td) vaccine.** / Consult your health care Huzaifa Viney. 1 dose of Td every 10 years.  Varicella vaccine.** / Consult your health care Julizza Sassone.  Zoster vaccine.** / 1 dose for adults aged 28 years or older.  Measles, mumps, rubella (MMR) vaccine.** / You need at least 1 dose of MMR if you were born in 1957 or later. You may also need a second dose.  Pneumococcal 13-valent conjugate (PCV13) vaccine.** / Consult your health care Alaia Lordi.  Pneumococcal polysaccharide (PPSV23) vaccine.** / 1 to 2 doses if you smoke cigarettes or if you have certain conditions.  Meningococcal vaccine.** / Consult your health care Donnica Jarnagin.  Hepatitis A vaccine.** / Consult your health care Paton Crum.  Hepatitis B vaccine.** / Consult your health care Aleksandr Pellow.  Haemophilus influenzae type b (Hib) vaccine.** / Consult your health care Layann Bluett. Ages 84 and over  Blood pressure check.** / Every 1 to 2 years.  Lipid and cholesterol check.**/ Every 5 years beginning at age 95.  Lung cancer screening. / Every year if you are aged 103-80 years and have a 30-pack-year history of smoking and currently smoke or have quit within the past 15 years. Yearly screening is stopped once you have quit smoking for at least 15 years or develop a health problem that would prevent you from  having lung cancer treatment.  Fecal occult blood test (FOBT) of stool. / Every year beginning at age 39 and continuing until age 21. You may not have to do this test if you get a colonoscopy every 10 years.  Flexible sigmoidoscopy** or colonoscopy.** / Every 5 years for a flexible sigmoidoscopy or every 10 years for a colonoscopy beginning at age 78 and continuing until age 24.  Hepatitis C blood test.** / For all people born from [birth year] through 1966 and any individual with known risks for hepatitis C.  Abdominal aortic aneurysm (AAA) screening.** / A one-time screening for ages 75 to 28 years who are current or former smokers.  Skin self-exam. / Monthly.  Influenza vaccine. / Every year.  Tetanus, diphtheria, and acellular pertussis (Tdap/Td) vaccine.** / 1 dose of Td every 10 years.  Varicella vaccine.** / Consult your health care Henlee Donovan.  Zoster vaccine.** / 1 dose for adults aged 47 years or older.  Pneumococcal 13-valent conjugate (PCV13) vaccine.** / Consult your health care Jamela Cumbo.  Pneumococcal polysaccharide (PPSV23) vaccine.** / 1 dose for all adults aged 47 years and older.  Meningococcal vaccine.** / Consult your health care Angellina Ferdinand.  Hepatitis A vaccine.** / Consult your health care Annette Bertelson.  Hepatitis B vaccine.** / Consult your health care Almond Fitzgibbon.  Haemophilus influenzae type b (Hib) vaccine.** / Consult your health care Rommel Hogston. **Family history and personal history of risk and conditions may change your health care Alese Furniss's recommendations. Document Released: 06/07/2001 Document Revised: 04/16/2013 Document Reviewed: 09/06/2010 [facility] Patient Information 2015 [place], [place]. This information is not intended to replace advice given to you by your health care Rajan Burgard. Make sure you discuss any questions you have with your health care Elara Cocke.

## 2013-10-17 NOTE — Progress Notes (Signed)
Pre visit review using our clinic review tool, if applicable. No additional management support is needed unless otherwise documented below in the visit note/SLS  

## 2013-10-18 ENCOUNTER — Telehealth: Payer: Self-pay | Admitting: Physician Assistant

## 2013-10-18 DIAGNOSIS — E785 Hyperlipidemia, unspecified: Secondary | ICD-10-CM

## 2013-10-18 LAB — URINALYSIS, MICROSCOPIC ONLY
Bacteria, UA: NONE SEEN
Casts: NONE SEEN
Crystals: NONE SEEN
Squamous Epithelial / HPF: NONE SEEN

## 2013-10-18 LAB — URINALYSIS, ROUTINE W REFLEX MICROSCOPIC
BILIRUBIN URINE: NEGATIVE
Glucose, UA: NEGATIVE mg/dL
HGB URINE DIPSTICK: NEGATIVE
KETONES UR: NEGATIVE mg/dL
Leukocytes, UA: NEGATIVE
Nitrite: NEGATIVE
PROTEIN: 100 mg/dL — AB
Specific Gravity, Urine: 1.02 (ref 1.005–1.030)
Urobilinogen, UA: 0.2 mg/dL (ref 0.0–1.0)
pH: 6.5 (ref 5.0–8.0)

## 2013-10-18 LAB — TSH: TSH: 2.144 u[IU]/mL (ref 0.350–4.500)

## 2013-10-18 LAB — PSA: PSA: 7.83 ng/mL — ABNORMAL HIGH (ref <=4.00)

## 2013-10-18 MED ORDER — ATORVASTATIN CALCIUM 20 MG PO TABS: 20.0000 mg | ORAL_TABLET | Freq: Every day | ORAL | Status: DC

## 2013-10-18 NOTE — Telephone Encounter (Signed)
Labs are in.  PSA is elevated.  I am sending him to urology for further evaluation.  It is important he sees this specialist. Also his cholesterol and triglycerides are high, increasing his risk of stroke and heart attack.  I would like for him to begin taking atorvastatin (lipitor) 20 mg daily.  Follow-up with Cardiology as scheduled.  They should be contacting him.  Follow-up with me as already scheduled.

## 2013-10-18 NOTE — Telephone Encounter (Signed)
Patient Unavailable; spouse [[name]] informed, understood & agreed/SLS

## 2013-10-22 ENCOUNTER — Telehealth: Payer: Self-pay | Admitting: *Deleted

## 2013-10-22 NOTE — Telephone Encounter (Signed)
Received call from pt's spouse that she was contacted re: her referral to GI for a screening colonoscopy but her husband has not been contacted and GI told her they did not receive his referral. Advised spouse that referral is still open in EPIC and was placed on 09/27/13 and to contact GI to schedule an appt.   Delsa Sale, do you know if pt's insurance requires additional authorization?

## 2013-10-23 ENCOUNTER — Institutional Professional Consult (permissible substitution): Payer: Medicare HMO | Admitting: Cardiology

## 2013-10-23 NOTE — Telephone Encounter (Signed)
Pt [facility] notes, they have contacted pt numerous time and keeps getting hung up on. Pt will need referral, insurance referral can not be done till we know which MD he will see

## 2013-10-23 NOTE — Telephone Encounter (Signed)
Advised spouse to contact GI office on 10/22/13 to arrange appt.

## 2013-10-23 NOTE — Telephone Encounter (Signed)
Joel Valdez, pt was having a problem with his phones and they had them replaced. I appears the original referral we placed is still open in EPIC "ready for initial scheduling" so it does not look like Provider needs to place another referral, correct?Marland Kitchen

## 2013-10-23 NOTE — Telephone Encounter (Signed)
No, the patient will just need to call GI

## 2013-10-31 ENCOUNTER — Ambulatory Visit (INDEPENDENT_AMBULATORY_CARE_PROVIDER_SITE_OTHER): Payer: Medicare HMO | Admitting: Physician Assistant

## 2013-10-31 ENCOUNTER — Encounter: Payer: Self-pay | Admitting: Physician Assistant

## 2013-10-31 VITALS — BP 132/86 | HR 97 | Temp 98.0°F | Resp 16 | Ht 67.5 in | Wt 165.2 lb

## 2013-10-31 DIAGNOSIS — I10 Essential (primary) hypertension: Secondary | ICD-10-CM

## 2013-10-31 DIAGNOSIS — K047 Periapical abscess without sinus: Secondary | ICD-10-CM

## 2013-10-31 DIAGNOSIS — K044 Acute apical periodontitis of pulpal origin: Secondary | ICD-10-CM | POA: Diagnosis not present

## 2013-10-31 MED ORDER — AMOXICILLIN 875 MG PO TABS: 875.0000 mg | ORAL_TABLET | Freq: Two times a day (BID) | ORAL | Status: DC

## 2013-10-31 NOTE — Patient Instructions (Addendum)
Please take antibiotic as directed with food. Use a soft-bristled toothbrush.  Avoid hard foods. Increase fluid intake.  Please schedule an appointment with a dentist.   Follow-up with specialists as directed.  It is very important that you go to the Cardiology and Urology appointments.

## 2013-10-31 NOTE — Progress Notes (Signed)
Pre visit review using our clinic review tool, if applicable. No additional management support is needed unless otherwise documented below in the visit note/SLS  

## 2013-11-03 DIAGNOSIS — K047 Periapical abscess without sinus: Secondary | ICD-10-CM | POA: Insufficient documentation

## 2013-11-03 NOTE — Assessment & Plan Note (Addendum)
Rx Amoxicillin.  Continue dental hygiene.  Switch to a soft-bristled toothbrush.  See dentist.
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
## 2013-11-03 NOTE — Assessment & Plan Note (Signed)
Continue current regimen

## 2013-11-13 ENCOUNTER — Encounter: Payer: Self-pay | Admitting: Cardiology

## 2013-11-13 ENCOUNTER — Ambulatory Visit (INDEPENDENT_AMBULATORY_CARE_PROVIDER_SITE_OTHER): Payer: Commercial Managed Care - HMO | Admitting: Physician Assistant

## 2013-11-13 ENCOUNTER — Encounter: Payer: Self-pay | Admitting: Physician Assistant

## 2013-11-13 ENCOUNTER — Encounter: Payer: Self-pay | Admitting: *Deleted

## 2013-11-13 ENCOUNTER — Ambulatory Visit (INDEPENDENT_AMBULATORY_CARE_PROVIDER_SITE_OTHER): Payer: Commercial Managed Care - HMO | Admitting: Cardiology

## 2013-11-13 VITALS — BP 128/82 | HR 82 | Temp 98.0°F | Resp 16 | Ht 67.5 in | Wt 162.5 lb

## 2013-11-13 VITALS — BP 110/64 | HR 84 | Ht 67.0 in | Wt 162.1 lb

## 2013-11-13 DIAGNOSIS — R9431 Abnormal electrocardiogram [ECG] [EKG]: Secondary | ICD-10-CM | POA: Diagnosis not present

## 2013-11-13 DIAGNOSIS — R0989 Other specified symptoms and signs involving the circulatory and respiratory systems: Secondary | ICD-10-CM | POA: Diagnosis not present

## 2013-11-13 DIAGNOSIS — M26629 Arthralgia of temporomandibular joint, unspecified side: Secondary | ICD-10-CM

## 2013-11-13 DIAGNOSIS — M2669 Other specified disorders of temporomandibular joint: Secondary | ICD-10-CM

## 2013-11-13 DIAGNOSIS — I10 Essential (primary) hypertension: Secondary | ICD-10-CM | POA: Diagnosis not present

## 2013-11-13 DIAGNOSIS — H65192 Other acute nonsuppurative otitis media, left ear: Secondary | ICD-10-CM

## 2013-11-13 DIAGNOSIS — H65199 Other acute nonsuppurative otitis media, unspecified ear: Secondary | ICD-10-CM

## 2013-11-13 MED ORDER — MELOXICAM 7.5 MG PO TABS: 7.5000 mg | ORAL_TABLET | Freq: Every day | ORAL | Status: DC

## 2013-11-13 MED ORDER — AZITHROMYCIN 250 MG PO TABS: ORAL_TABLET | ORAL | Status: DC

## 2013-11-13 NOTE — Patient Instructions (Signed)
Your physician recommends that you schedule a follow-up appointment in: AS NEEDED PENDING TEST RESULTS  Your physician has requested that you have en exercise stress myoview. For further information please visit [email or web address]. Please follow instruction sheet, as given.  Your physician has requested that you have an abdominal aorta duplex. During this test, an ultrasound is used to evaluate the aorta. Allow 30 minutes for this exam. Do not eat after midnight the day before and avoid carbonated beverages

## 2013-11-13 NOTE — Assessment & Plan Note (Signed)
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 He has had occasional indigestion in the past but denies any exertional chest pain. I will arrange a stress nuclear study to exclude ischemia and to rule out previous infarct. If normal we'll not pursue further cardiac evaluation.
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
## 2013-11-13 NOTE — Assessment & Plan Note (Signed)
Patient has occasional dizziness with standing. He may need reduction of his blood pressure medications in the future. He will discuss this with primary care. He has some pain under his left ear and he will also have this evaluated by primary care.

## 2013-11-13 NOTE — Assessment & Plan Note (Signed)
Given age and previous tobacco history will arrange abdominal ultrasound to exclude an aneurysm as recommended by guidelines.

## 2013-11-13 NOTE — Progress Notes (Signed)
Pre visit review using our clinic review tool, if applicable. No additional management support is needed unless otherwise documented below in the visit note/SLS  

## 2013-11-13 NOTE — Patient Instructions (Signed)
For ear infection, take antibiotic as directed.  Stay well hydrated and take a multivitamin.   For TMJ pain, take Meloxicam daily as directed.  Apply Aspercreme topically to affected area.  Apply ice in 15 minute intervals, a few times per day. Avoid gum chewing or chewing on the affected side.
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
## 2013-11-14 NOTE — Assessment & Plan Note (Signed)
Rx Azithromycin.

## 2013-11-14 NOTE — Assessment & Plan Note (Signed)
Rx Mobic to take daily with food.  ICE.  Topical Aspercreme.  Avoid chewing on affected side. No gum chewing.  Call or return to clinic if symptoms are not improving.

## 2013-11-18 ENCOUNTER — Telehealth: Payer: Self-pay | Admitting: Physician Assistant

## 2013-11-18 MED ORDER — AMLODIPINE BESYLATE 10 MG PO TABS: 10.0000 mg | ORAL_TABLET | Freq: Every day | ORAL | Status: DC

## 2013-11-18 NOTE — Telephone Encounter (Signed)
Rx request to pharmacy/[facility]  

## 2013-11-18 NOTE — Telephone Encounter (Signed)
Requesting refill on Amlodipine Besylate 10mg , take 1 tab by mouth daily Please call into [facility] [phone] fax [phone]

## 2013-11-20 ENCOUNTER — Telehealth: Payer: Self-pay | Admitting: *Deleted

## 2013-11-20 DIAGNOSIS — I10 Essential (primary) hypertension: Secondary | ICD-10-CM

## 2013-11-20 MED ORDER — LISINOPRIL-HYDROCHLOROTHIAZIDE 10-12.5 MG PO TABS: 1.0000 | ORAL_TABLET | Freq: Every day | ORAL | Status: DC

## 2013-11-20 NOTE — Telephone Encounter (Signed)
Rx request to pharmacy/[facility]  

## 2013-11-25 ENCOUNTER — Encounter (HOSPITAL_COMMUNITY): Payer: Medicare HMO

## 2013-11-26 ENCOUNTER — Ambulatory Visit (HOSPITAL_BASED_OUTPATIENT_CLINIC_OR_DEPARTMENT_OTHER): Payer: Medicare HMO | Admitting: Cardiology

## 2013-11-26 ENCOUNTER — Ambulatory Visit (HOSPITAL_COMMUNITY)
Admission: RE | Admit: 2013-11-26 | Discharge: 2013-11-26 | Disposition: A | Discharge status: Home / Self Care | Payer: Medicare HMO | Source: Ambulatory Visit | Attending: Internal Medicine | Admitting: Internal Medicine

## 2013-11-26 ENCOUNTER — Encounter (HOSPITAL_COMMUNITY): Payer: Medicare HMO

## 2013-11-26 DIAGNOSIS — I700 Atherosclerosis of aorta: Secondary | ICD-10-CM

## 2013-11-26 DIAGNOSIS — R0989 Other specified symptoms and signs involving the circulatory and respiratory systems: Secondary | ICD-10-CM

## 2013-11-26 DIAGNOSIS — R9431 Abnormal electrocardiogram [ECG] [EKG]: Secondary | ICD-10-CM | POA: Insufficient documentation

## 2013-11-26 MED ORDER — TECHNETIUM TC 99M SESTAMIBI GENERIC - CARDIOLITE
29.8000 | Freq: Once | INTRAVENOUS | Status: AC | PRN
  Administered 2013-11-26: 29.8 via INTRAVENOUS

## 2013-11-26 MED ORDER — TECHNETIUM TC 99M SESTAMIBI GENERIC - CARDIOLITE
10.6000 | Freq: Once | INTRAVENOUS | Status: AC | PRN
  Administered 2013-11-26: 11 via INTRAVENOUS

## 2013-11-26 NOTE — Progress Notes (Signed)
Duplex aorta performed

## 2013-11-26 NOTE — Procedures (Addendum)
[place] [facility] [address] [place] [address] [phone]  Cardiology Nuclear Med Study  Joel Valdez is a 68 y.o. male     MRN : [id number]     DOB: 10-Mar-1946  Procedure Date: 11/26/2013  Nuclear Med Background Indication for Stress Test:  Abnormal EKG History:  No prior NUC MPI for comparison;No cardiac or respiratory history reported. Cardiac Risk Factors: Family History - CAD, History of Smoking, Hypertension, Lipids and ABD bruit  Symptoms:  Dizziness, DOE and Light-Headedness   Nuclear Pre-Procedure Caffeine/Decaff Intake:  9:00pm NPO After: 7:00am   IV Site: R Forearm  IV 0.9% NS with Angio Cath:  22g  Chest Size (in):  44"  IV Started by: Rolene Course, RN  Height: 5\' 7"  (1.702 m)  Cup Size: n/a  BMI:  Body mass index is 25.37 kg/(m^2). Weight:  162 lb (73.483 kg)   Tech Comments:  n/a    Nuclear Med Study 1 or 2 day study: 1 day  Stress Test Type:  Stress  Order Authorizing Provider:  Kirk Ruths, MD   Resting Radionuclide: Technetium 70m Sestamibi  Resting Radionuclide Dose: 10.6 mCi   Stress Radionuclide:  Technetium 3m Sestamibi  Stress Radionuclide Dose: 29.8 mCi           Stress Protocol Rest HR: 66 Stress HR: 131  Rest BP: 113/79 Stress BP: 168/82  Exercise Time (min): 6:21 METS: 7.00   Predicted Max HR: 151 bpm % Max HR: 86.75 bpm Rate Pressure Product: 22008  Dose of Adenosine (mg):  n/a Dose of Lexiscan: n/a mg  Dose of Atropine (mg): n/a Dose of Dobutamine: n/a mcg/kg/min (at max HR)  Stress Test Technologist: Mellody Memos, CCT Nuclear Technologist: Imagene Riches, CNMT   Rest Procedure:  Myocardial perfusion imaging was performed at rest 45 minutes following the intravenous administration of Technetium 14m Sestamibi. Stress Procedure:  The patient performed treadmill exercise using a Bruce  Protocol for 6 minutes 21 seconds. The patient stopped due to leg fatigue, leg, hip and knee  pain.Patient denied any chest pain.  There were no significant ST-T wave changes.  Technetium 69m Sestamibi was injected IV at peak exercise and myocardial perfusion imaging was performed after a brief delay.  Transient Ischemic Dilatation (Normal <1.22):  0.93  QGS EDV:  84 ml QGS ESV:  41 ml LV Ejection Fraction: 52%      Rest ECG: NSR - Normal EKG  Stress ECG: No significant ST segment change suggestive of ischemia. and there is a marked increase in PVC frequency with exercise, occasionally with ventricular couplets. The rhythm normalizes in recovery.  QPS Raw Data Images:  Normal; no motion artifact; normal heart/lung ratio. Stress Images:  There is decreased uptake in the inferior wall. Rest Images:  Comparison with the stress images reveals no significant change. Subtraction (SDS):  There is a fixed defect that is most consistent with a previous infarction. LV Wall Motion:  mild inferior wall hypokinesis, borderline depressed overall LV function, EF 52%  Impression Exercise Capacity:  Fair exercise capacity. BP Response:  Normal blood pressure response. Clinical Symptoms:  There is dyspnea. ECG Impression:  No significant ST segment change suggestive of ischemia. Comparison with Prior Nuclear Study: No previous nuclear study performed   Overall Impression:  Low risk stress nuclear study with a small inferoseptal scar. There is no evidence of reversible ischemia. LV systolic function is borderline reduced. There is marked increase in ventricular ectopy with exercise.Marland Kitchen  Sanda Klein, MD  11/26/2013 12:33 PM

## 2013-11-28 ENCOUNTER — Encounter (HOSPITAL_COMMUNITY): Payer: Medicare HMO

## 2014-01-06 ENCOUNTER — Telehealth: Payer: Self-pay | Admitting: Physician Assistant

## 2014-01-06 DIAGNOSIS — H409 Unspecified glaucoma: Secondary | ICD-10-CM

## 2014-01-06 NOTE — Telephone Encounter (Signed)
See below. Referral has been placed.

## 2014-01-06 NOTE — Telephone Encounter (Signed)
Pt was seen at [facility] retina specialist on 9-9/15, needing referral faxed asap  Fax to [phone], pt has [employer].Joel Valdez

## 2014-01-09 ENCOUNTER — Ambulatory Visit
Admission: RE | Admit: 2014-01-09 | Discharge: 2014-01-09 | Disposition: A | Discharge status: Home / Self Care | Payer: Commercial Managed Care - HMO | Source: Ambulatory Visit | Attending: Ophthalmology | Admitting: Ophthalmology

## 2014-01-09 ENCOUNTER — Other Ambulatory Visit: Payer: Self-pay | Admitting: Ophthalmology

## 2014-01-09 DIAGNOSIS — A150 Tuberculosis of lung: Secondary | ICD-10-CM

## 2014-03-04 ENCOUNTER — Other Ambulatory Visit: Payer: Self-pay | Admitting: Urology

## 2014-03-04 DIAGNOSIS — C61 Malignant neoplasm of prostate: Secondary | ICD-10-CM

## 2014-03-17 ENCOUNTER — Encounter (HOSPITAL_COMMUNITY)
Admission: RE | Admit: 2014-03-17 | Discharge: 2014-03-17 | Disposition: A | Discharge status: Home / Self Care | Payer: Medicare HMO | Source: Ambulatory Visit | Attending: Urology | Admitting: Urology

## 2014-03-17 DIAGNOSIS — C61 Malignant neoplasm of prostate: Secondary | ICD-10-CM | POA: Diagnosis present

## 2014-03-17 MED ORDER — TECHNETIUM TC 99M MEDRONATE IV KIT
26.3000 | PACK | Freq: Once | INTRAVENOUS | Status: AC | PRN
  Administered 2014-03-17: 26.3 via INTRAVENOUS

## 2014-03-25 ENCOUNTER — Other Ambulatory Visit: Payer: Self-pay

## 2014-03-25 DIAGNOSIS — I10 Essential (primary) hypertension: Secondary | ICD-10-CM

## 2014-03-25 MED ORDER — LISINOPRIL-HYDROCHLOROTHIAZIDE 10-12.5 MG PO TABS: 1.0000 | ORAL_TABLET | Freq: Every day | ORAL | Status: DC

## 2014-04-24 ENCOUNTER — Encounter: Payer: Self-pay | Admitting: Radiation Oncology

## 2014-04-24 NOTE — Progress Notes (Signed)
GU Location of Tumor / Histology: prostatic adenocarcinoma  If Prostate Cancer, Gleason Score is (4 + 4) and PSA is (9.58)  Biopsies of prostate (if applicable) revealed:    Past/Anticipated interventions by urology, if any: biopsy and referral to Dr. Tammi Klippel; Dahlstedt believes based on his age and high risk group the patient would be best served by having radiotherapy in combination with androgen deprivation for at least 1 year  Past/Anticipated interventions by medical oncology, if any: no  Weight changes, if any: no  Bowel/Bladder complaints, if any:    Nausea/Vomiting, if any: no  Pain issues, if any:  no  SAFETY ISSUES:  Prior radiation? no  Pacemaker/ICD? no  Possible current pregnancy? no  Is the patient on methotrexate? no  Current Complaints / other details:  68 year old male. Prostatic volume 40.22 cc. IPSS 4 on 03/24/2014. Married with one daughter. Retired.

## 2014-04-28 ENCOUNTER — Ambulatory Visit
Admission: RE | Admit: 2014-04-28 | Discharge: 2014-04-28 | Disposition: A | Discharge status: Home / Self Care | Payer: Commercial Managed Care - HMO | Source: Ambulatory Visit | Attending: Radiation Oncology | Admitting: Radiation Oncology

## 2014-04-28 ENCOUNTER — Encounter: Payer: Self-pay | Admitting: Radiation Oncology

## 2014-04-28 ENCOUNTER — Other Ambulatory Visit: Payer: Self-pay | Admitting: *Deleted

## 2014-04-28 VITALS — BP 119/71 | HR 72 | Resp 16 | Ht 67.0 in | Wt 164.8 lb

## 2014-04-28 DIAGNOSIS — C61 Malignant neoplasm of prostate: Secondary | ICD-10-CM | POA: Diagnosis not present

## 2014-04-28 DIAGNOSIS — Z87891 Personal history of nicotine dependence: Secondary | ICD-10-CM | POA: Insufficient documentation

## 2014-04-28 DIAGNOSIS — E785 Hyperlipidemia, unspecified: Secondary | ICD-10-CM | POA: Diagnosis not present

## 2014-04-28 DIAGNOSIS — Z7982 Long term (current) use of aspirin: Secondary | ICD-10-CM | POA: Diagnosis not present

## 2014-04-28 DIAGNOSIS — I10 Essential (primary) hypertension: Secondary | ICD-10-CM | POA: Insufficient documentation

## 2014-04-28 MED ORDER — AMLODIPINE BESYLATE 10 MG PO TABS: 10.0000 mg | ORAL_TABLET | Freq: Every day | ORAL | Status: DC

## 2014-04-28 NOTE — Progress Notes (Signed)
See progress note under physician encounter. 

## 2014-04-28 NOTE — Addendum Note (Signed)
Addended by: Rockwell Germany on: 04/28/2014 03:10 PM   Modules accepted: Orders
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
## 2014-04-28 NOTE — Progress Notes (Signed)
Retired. Married with two kids. Reports he has lost six pounds over the last month. 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 Reports nocturia x3. Denies dysuria or hematuria. Describes a strong steady urine stream while voiding. Reports occasional difficulty emptying his bladder. Denies urgency. Denies leakage or incontinence.Denies night sweats. Vitals stable.

## 2014-04-28 NOTE — Telephone Encounter (Signed)
Rx request to pharmacy/[facility]  

## 2014-05-06 ENCOUNTER — Encounter: Payer: Self-pay | Admitting: *Deleted

## 2014-05-06 NOTE — Progress Notes (Signed)
[place] Psychosocial Distress Screening Clinical Social Work  Clinical Social Work was referred by distress screening protocol.  The patient scored a 8 on the Psychosocial Distress Thermometer which indicates severe distress. Clinical Social Worker attempted to contact patient by phone to assess for distress and other psychosocial needs. CSW unable to leave voicemail at only phone number listed in patient's demographics.  Currently no follow up appointment scheduled at [facility].  ON[facility]N DISTRESS SCREENING 04/28/2014  Screening Type Initial Screening  Distress experienced in past week (1-10) 8  Family Problem type Partner  Emotional problem type Depression;Nervousness/Anxiety;Adjusting to illness;Isolation/feeling alone;Feeling hopeless;Adjusting to appearance changes  Spiritual/Religous concerns type Facing my mortality;Loss of sense of purpose  Physical Problem type Pain;Sleep/insomnia;Breathing;Changes in urination;Swollen arms/legs  Physician notified of physical symptoms Yes  Referral to clinical psychology No  Referral to clinical social work Yes  Referral to dietition No  Referral to financial advocate No  Referral to support programs No  Referral to palliative care No    Clinical Social Worker follow up needed: Yes.    If yes, follow up plan: CSW will attempt to follow up at a later time by phone.  Polo Riley, MSW, LCSW, OSW-C Clinical Social Worker [facility] [phone]

## 2014-05-20 DIAGNOSIS — H34812 Central retinal vein occlusion, left eye: Secondary | ICD-10-CM | POA: Diagnosis not present

## 2014-05-20 DIAGNOSIS — H211X2 Other vascular disorders of iris and ciliary body, left eye: Secondary | ICD-10-CM | POA: Diagnosis not present

## 2014-05-20 DIAGNOSIS — H3581 Retinal edema: Secondary | ICD-10-CM | POA: Diagnosis not present

## 2014-05-20 DIAGNOSIS — H40052 Ocular hypertension, left eye: Secondary | ICD-10-CM | POA: Diagnosis not present

## 2014-06-03 ENCOUNTER — Other Ambulatory Visit: Payer: Self-pay | Admitting: *Deleted

## 2014-06-03 DIAGNOSIS — I10 Essential (primary) hypertension: Secondary | ICD-10-CM

## 2014-06-03 MED ORDER — LISINOPRIL-HYDROCHLOROTHIAZIDE 10-12.5 MG PO TABS: 1.0000 | ORAL_TABLET | Freq: Every day | ORAL | Status: DC

## 2014-06-03 NOTE — Telephone Encounter (Signed)
Rx request to pharmacy/[facility]  

## 2014-06-25 ENCOUNTER — Ambulatory Visit (INDEPENDENT_AMBULATORY_CARE_PROVIDER_SITE_OTHER): Payer: Commercial Managed Care - HMO | Admitting: Physician Assistant

## 2014-06-25 ENCOUNTER — Encounter: Payer: Self-pay | Admitting: Physician Assistant

## 2014-06-25 VITALS — BP 104/69 | HR 81 | Temp 98.0°F | Resp 16 | Ht 67.0 in | Wt 161.0 lb

## 2014-06-25 DIAGNOSIS — J069 Acute upper respiratory infection, unspecified: Secondary | ICD-10-CM

## 2014-06-25 DIAGNOSIS — I10 Essential (primary) hypertension: Secondary | ICD-10-CM | POA: Diagnosis not present

## 2014-06-25 DIAGNOSIS — B9789 Other viral agents as the cause of diseases classified elsewhere: Secondary | ICD-10-CM

## 2014-06-25 MED ORDER — LISINOPRIL-HYDROCHLOROTHIAZIDE 10-12.5 MG PO TABS: 1.0000 | ORAL_TABLET | Freq: Every day | ORAL | Status: DC

## 2014-06-25 MED ORDER — BENZONATATE 100 MG PO CAPS: 100.0000 mg | ORAL_CAPSULE | Freq: Three times a day (TID) | ORAL | Status: DC | PRN

## 2014-06-25 MED ORDER — AMLODIPINE BESYLATE 10 MG PO TABS: 5.0000 mg | ORAL_TABLET | Freq: Every day | ORAL | Status: DC
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
## 2014-06-25 NOTE — Patient Instructions (Signed)
Please continue medications with the following exception -- the new prescription for amlodipine will be for 5 mg daily.  This will be 1/2 tablet daily.  For your respiratory symptoms, these seem viral in nature.  Stay well hydrated. Use plain Mucinex for congestion.  Use the Tessalon as directed for cough.  Follow-up in 3 months for your physical.  Viral Infections A virus is a type of germ. Viruses can cause:  Minor sore throats.  Aches and pains.  Headaches.  Runny nose.  Rashes.  Watery eyes.  Tiredness.  Coughs.  Loss of appetite.  Feeling sick to your stomach (nausea).  Throwing up (vomiting).  Watery poop (diarrhea). HOME CARE   Only take medicines as told by your doctor.  Drink enough water and fluids to keep your pee (urine) clear or pale yellow. Sports drinks are a good choice.  Get plenty of rest and eat healthy. Soups and broths with crackers or rice are fine. GET HELP RIGHT AWAY IF:   You have a very bad headache.  You have shortness of breath.  You have chest pain or neck pain.  You have an unusual rash.  You cannot stop throwing up.  You have watery poop that does not stop.  You cannot keep fluids down.  You or your child has a temperature by mouth above 102 F (38.9 C), not controlled by medicine.  Your baby is older than 3 months with a rectal temperature of 102 F (38.9 C) or higher.  Your baby is 69 months old or younger with a rectal temperature of 100.4 F (38 C) or higher. MAKE SURE YOU:   Understand these instructions.  Will watch this condition.  Will get help right away if you are not doing well or get worse. Document Released: 03/24/2008 Document Revised: 07/04/2011 Document Reviewed: 08/17/2010 [facility] Patient Information 2015 [place], [place]. This information is not intended to replace advice given to you by your health care provider. Make sure you discuss any questions you have with your health care provider.

## 2014-06-25 NOTE — Progress Notes (Signed)
Pre visit review using our clinic review tool, if applicable. No additional management support is needed unless otherwise documented below in the visit note/SLS  

## 2014-06-25 NOTE — Assessment & Plan Note (Signed)
Continue Lisinopril-HCTZ 10-12.5 daily.  Will decrease Amlodipine to 5 mg daily. Continue DASH diet.  Follow-up 3 months.

## 2014-06-25 NOTE — Assessment & Plan Note (Signed)
Exam unremarkable.  Symptoms are improving.  Supportive measures discussed.  Tessalon per orders.

## 2014-07-22 ENCOUNTER — Other Ambulatory Visit: Payer: Self-pay | Admitting: *Deleted

## 2014-07-22 NOTE — Telephone Encounter (Signed)
Received request from pharmacy for refill on Atorvastatin, last Rx to pharmacy 06.25.15, #90 with 3 refills. Phoned pharmacy to verify that patient should have 90-day supply ready and should not be due for new Rx until June 2016; pharmacy looked at patient's chart and apologized for the early request/SLS

## 2014-08-13 ENCOUNTER — Telehealth: Payer: Self-pay | Admitting: Physician Assistant

## 2014-08-13 DIAGNOSIS — M2560 Stiffness of unspecified joint, not elsewhere classified: Secondary | ICD-10-CM

## 2014-08-13 NOTE — Telephone Encounter (Signed)
Caller name: Burdo,[name] Relation to pt: spouse  Call back number: [phone]  Reason for call:  Spouse requesting a referral for [facility]: Hennie Duos MD

## 2014-08-13 NOTE — Telephone Encounter (Signed)
Done

## 2014-08-13 NOTE — Telephone Encounter (Signed)
Need a diagnosis to attach to referral -- having hand pain still or something new?

## 2014-08-13 NOTE — Telephone Encounter (Signed)
Hands

## 2014-09-19 ENCOUNTER — Other Ambulatory Visit: Payer: Self-pay | Admitting: Physician Assistant

## 2014-09-19 MED ORDER — AMLODIPINE BESYLATE 5 MG PO TABS: 5.0000 mg | ORAL_TABLET | Freq: Every day | ORAL | Status: DC

## 2014-09-24 ENCOUNTER — Telehealth: Payer: Self-pay | Admitting: Physician Assistant

## 2014-09-24 NOTE — Telephone Encounter (Signed)
Pre Visit letter sent  °

## 2014-09-29 DIAGNOSIS — M2550 Pain in unspecified joint: Secondary | ICD-10-CM | POA: Diagnosis not present

## 2014-09-29 DIAGNOSIS — M7989 Other specified soft tissue disorders: Secondary | ICD-10-CM | POA: Diagnosis not present

## 2014-09-29 DIAGNOSIS — R5383 Other fatigue: Secondary | ICD-10-CM | POA: Diagnosis not present

## 2014-09-29 DIAGNOSIS — A150 Tuberculosis of lung: Secondary | ICD-10-CM | POA: Diagnosis not present

## 2014-10-13 ENCOUNTER — Encounter: Payer: Commercial Managed Care - HMO | Admitting: Physician Assistant

## 2014-10-20 ENCOUNTER — Telehealth: Payer: Self-pay | Admitting: Behavioral Health

## 2014-10-20 NOTE — Telephone Encounter (Signed)
Unable to reach patient at time of Pre-Visit Call.  Patient's voicemail has not been activated, therefore a message could not be left for the patient to return the call.

## 2014-10-21 ENCOUNTER — Ambulatory Visit (INDEPENDENT_AMBULATORY_CARE_PROVIDER_SITE_OTHER): Payer: Commercial Managed Care - HMO | Admitting: Physician Assistant

## 2014-10-21 ENCOUNTER — Encounter: Payer: Self-pay | Admitting: Physician Assistant

## 2014-10-21 VITALS — BP 123/74 | HR 74 | Temp 98.0°F | Ht 67.5 in | Wt 155.0 lb

## 2014-10-21 DIAGNOSIS — Z136 Encounter for screening for cardiovascular disorders: Secondary | ICD-10-CM | POA: Diagnosis not present

## 2014-10-21 DIAGNOSIS — C61 Malignant neoplasm of prostate: Secondary | ICD-10-CM

## 2014-10-21 DIAGNOSIS — Z1211 Encounter for screening for malignant neoplasm of colon: Secondary | ICD-10-CM | POA: Diagnosis not present

## 2014-10-21 DIAGNOSIS — I10 Essential (primary) hypertension: Secondary | ICD-10-CM

## 2014-10-21 DIAGNOSIS — F329 Major depressive disorder, single episode, unspecified: Secondary | ICD-10-CM

## 2014-10-21 DIAGNOSIS — Z0000 Encounter for general adult medical examination without abnormal findings: Secondary | ICD-10-CM

## 2014-10-21 DIAGNOSIS — F32A Depression, unspecified: Secondary | ICD-10-CM

## 2014-10-21 LAB — URINALYSIS, ROUTINE W REFLEX MICROSCOPIC
BILIRUBIN URINE: NEGATIVE
Ketones, ur: NEGATIVE
Leukocytes, UA: NEGATIVE
NITRITE: NEGATIVE
PH: 6 (ref 5.0–8.0)
Specific Gravity, Urine: 1.01 (ref 1.000–1.030)
Total Protein, Urine: NEGATIVE
Urine Glucose: NEGATIVE
Urobilinogen, UA: 0.2 (ref 0.0–1.0)

## 2014-10-21 LAB — BASIC METABOLIC PANEL
BUN: 30 mg/dL — ABNORMAL HIGH (ref 6–23)
CHLORIDE: 99 meq/L (ref 96–112)
CO2: 28 meq/L (ref 19–32)
Calcium: 9.7 mg/dL (ref 8.4–10.5)
Creatinine, Ser: 1.45 mg/dL (ref 0.40–1.50)
GFR: 51.14 mL/min — ABNORMAL LOW (ref >60.00)
Glucose, Bld: 94 mg/dL (ref 70–99)
Potassium: 3.8 mEq/L (ref 3.5–5.1)
Sodium: 136 mEq/L (ref 135–145)

## 2014-10-21 LAB — CBC
HCT: 37.9 % — ABNORMAL LOW (ref 39.0–52.0)
Hemoglobin: 12.8 g/dL — ABNORMAL LOW (ref 13.0–17.0)
MCHC: 33.7 g/dL (ref 30.0–36.0)
MCV: 87.9 fl (ref 78.0–100.0)
PLATELETS: 285 10*3/uL (ref 150.0–400.0)
RBC: 4.31 Mil/uL (ref 4.22–5.81)
RDW: 15.1 % (ref 11.5–15.5)
WBC: 12.7 10*3/uL — AB (ref 4.0–10.5)

## 2014-10-21 LAB — LIPID PANEL
Cholesterol: 166 mg/dL (ref 0–200)
HDL: 56.7 mg/dL (ref >39.00)
LDL CALC: 96 mg/dL (ref 0–99)
NONHDL: 109.3
Total CHOL/HDL Ratio: 3
Triglycerides: 67 mg/dL (ref 0.0–149.0)
VLDL: 13.4 mg/dL (ref 0.0–40.0)

## 2014-10-21 LAB — HEPATIC FUNCTION PANEL
ALK PHOS: 55 U/L (ref 39–117)
ALT: 17 U/L (ref 0–53)
AST: 14 U/L (ref 0–37)
Albumin: 4.3 g/dL (ref 3.5–5.2)
BILIRUBIN DIRECT: 0 mg/dL (ref 0.0–0.3)
TOTAL PROTEIN: 7.6 g/dL (ref 6.0–8.3)
Total Bilirubin: 0.5 mg/dL (ref 0.2–1.2)

## 2014-10-21 MED ORDER — ESCITALOPRAM OXALATE 10 MG PO TABS: 10.0000 mg | ORAL_TABLET | Freq: Every day | ORAL | Status: DC

## 2014-10-21 NOTE — Progress Notes (Signed)
Pre visit review using our clinic review tool, if applicable. No additional management support is needed unless otherwise documented below in the visit note. 

## 2014-10-21 NOTE — Patient Instructions (Signed)
Please go to the lab for blood work. I will call you with your results. Continue medications as directed. Start the Lexapro night as directed.  Consider letting me set you up with a counselor.  Follow-up 4-6 weeks.  You will be contacted by a new urologist for a second opinion regarding Prostate Cancer treatment.

## 2014-10-22 NOTE — Progress Notes (Signed)
Subjective:    Joel Valdez is a 69 y.o. male who presents for Medicare Annual/Subsequent preventive examination.   Preventive Screening-Counseling & Management  Tobacco History  Smoking status  . Former Smoker -- 1.00 packs/day for 15 years  . Quit date: 09/25/2006  Smokeless tobacco  . Former Systems developer    Problems Prior to Visit 1. Patient endorses feeling depressed most days of the week over the past several months.  Endorses lack of interest in activities he normally wound enjoy. Notes mild anorexia.  Denies change in sleep. Denies SI/HI. Denies anxiety or panic attack.  2. Patient recently diagnosed with Prostate Cancer by his Urologist. Was told he needed radiation. Is requesting a second opinion regarding treatment options for this diagnosis.  Current Problems (verified) Patient Active Problem List   Diagnosis Date Noted  . Viral URI with cough 06/25/2014  . Prostate cancer 04/28/2014  . Bruit 11/13/2013  . Abnormal electrocardiogram 11/13/2013  . Glaucoma 09/27/2013  . Essential hypertension, benign 09/27/2013  . Nocturia 09/27/2013  . Colon cancer screening 09/27/2013    Medications Prior to Visit Current Outpatient Prescriptions on File Prior to Visit  Medication Sig Dispense Refill  . acetaminophen (TYLENOL) 325 MG tablet Take 650 mg by mouth every 6 (six) hours as needed.    Marland Kitchen amLODipine (NORVASC) 5 MG tablet Take 1 tablet (5 mg total) by mouth daily. 90 tablet 1  . aspirin EC 81 MG tablet Take 1 tablet (81 mg total) by mouth daily. 90 tablet 3  . atorvastatin (LIPITOR) 20 MG tablet Take 1 tablet (20 mg total) by mouth daily. 90 tablet 3  . Cholecalciferol (VITAMIN D3) 5000 UNITS TABS Take 2 each by mouth daily.    Marland Kitchen lisinopril-hydrochlorothiazide (PRINZIDE,ZESTORETIC) 10-12.5 MG per tablet Take 1 tablet by mouth daily. 90 tablet 1   No current facility-administered medications on file prior to visit.    Current Medications (verified) Current Outpatient  Prescriptions  Medication Sig Dispense Refill  . acetaminophen (TYLENOL) 325 MG tablet Take 650 mg by mouth every 6 (six) hours as needed.    Marland Kitchen amLODipine (NORVASC) 5 MG tablet Take 1 tablet (5 mg total) by mouth daily. 90 tablet 1  . aspirin EC 81 MG tablet Take 1 tablet (81 mg total) by mouth daily. 90 tablet 3  . atorvastatin (LIPITOR) 20 MG tablet Take 1 tablet (20 mg total) by mouth daily. 90 tablet 3  . Cholecalciferol (VITAMIN D3) 5000 UNITS TABS Take 2 each by mouth daily.    Marland Kitchen lisinopril-hydrochlorothiazide (PRINZIDE,ZESTORETIC) 10-12.5 MG per tablet Take 1 tablet by mouth daily. 90 tablet 1  . escitalopram (LEXAPRO) 10 MG tablet Take 1 tablet (10 mg total) by mouth daily. 30 tablet 1   No current facility-administered medications for this visit.     Allergies (verified) Review of patient's allergies indicates no known allergies.   PAST HISTORY  Family History Family History  Problem Relation Age of Onset  . Heart disease Mother   . Heart disease Father     Died of MI in his 56s  . Rheum arthritis Father     Social History History  Substance Use Topics  . Smoking status: Former Smoker -- 1.00 packs/day for 15 years    Quit date: 09/25/2006  . Smokeless tobacco: Former Systems developer  . Alcohol Use: No   Are there smokers in your home (other than you)?  No  Risk Factors Current exercise habits: Home exercise routine includes walking 1 hrs per day.  Dietary issues discussed: Body mass index is 23.9 kg/(m^2). Well-balanced diet.   Cardiac risk factors: advanced age (older than 71 for men, 19 for women), dyslipidemia and male gender.  Depression Screen (Note: if answer to either of the following is "Yes", a more complete depression screening is indicated)   Q1: Over the past two weeks, have you felt down, depressed or hopeless? Yes  Q2: Over the past two weeks, have you felt little interest or pleasure in doing things? Yes  Have you lost interest or pleasure in daily life?  No  Do you often feel hopeless? No  Do you cry easily over simple problems? No  Activities of Daily Living In your present state of health, do you have any difficulty performing the following activities?:  Driving? No Managing money?  No Feeding yourself? No Getting from bed to chair? No Climbing a flight of stairs? No Preparing food and eating?: No Bathing or showering? No Getting dressed: No Getting to the toilet? No Using the toilet:No Moving around from place to place: No In the past year have you fallen or had a near fall?:No   Are you sexually active?  No  Do you have more than one partner?  N/A  Hearing Difficulties: Patient wears hearing aid. Do you often ask people to speak up or repeat themselves? Yes Do you experience ringing or noises in your ears? No Do you have difficulty understanding soft or whispered voices? Yes   Do you feel that you have a problem with memory? No  Do you often misplace items? No  Do you feel safe at home?  Yes  Cognitive Testing  Alert? Yes  Normal Appearance?Yes  Oriented to person? Yes  Place? Yes   Time? Yes  Recall of three objects?  Yes  Can perform simple calculations? Yes  Displays appropriate judgment?Yes  Can read the correct time from a watch face?Yes   Advanced Directives have been discussed with the patient? Yes   List the Names of Other Physician/Practitioners you currently use: 1.  Dr.   Stanton Kidney any recent Medical Services you may have received from other than [facility] providers in the past year (date may be approximate).   There is no immunization history on file for this patient.  Screening Tests Health Maintenance  Topic Date Due  . TETANUS/TDAP  10/15/1963  . ZOSTAVAX  10/14/2004  . COLONOSCOPY  10/21/2019 (Originally 10/15/1994)  . Hepatitis C Screening  10/21/2019 (Originally June 15, 1945)  . PNA vac Low Risk Adult (1 of 2 - PCV13) 10/21/2019 (Originally 10/14/2009)  . INFLUENZA VACCINE  11/24/2014    All  answers were reviewed with the patient and necessary referrals were made:  Leeanne Rio, PA-C   10/22/2014   History reviewed: allergies, current medications, past family history, past medical history, past social history, past surgical history and problem list  Review of Systems A comprehensive review of systems was negative except for: Behavioral/Psych: positive for depression    Objective:     Blood pressure 123/74, pulse 74, temperature 98 F (36.7 C), temperature source Oral, height 5' 7.5" (1.715 m), weight 155 lb (70.308 kg), SpO2 100 %. Body mass index is 23.9 kg/(m^2).  General appearance: alert, cooperative, appears stated age and no distress Head: Normocephalic, without obvious abnormality, atraumatic Eyes: conjunctivae/corneas clear. PERRL, EOM's intact. Fundi benign. Ears: normal TM's and external ear canals both ears Nose: Nares normal. Septum midline. Mucosa normal. No drainage or sinus tenderness. Throat: lips, mucosa, and tongue normal; teeth  and gums normal Lungs: clear to auscultation bilaterally Chest wall: no tenderness Heart: regular rate and rhythm, S1, S2 normal, no murmur, click, rub or gallop Abdomen: soft, non-tender; bowel sounds normal; no masses,  no organomegaly Male genitalia: normal, deferred Extremities: extremities normal, atraumatic, no cyanosis or edema Pulses: 2+ and symmetric Skin: Skin color, texture, turgor normal. No rashes or lesions Lymph nodes: Cervical, supraclavicular, and axillary nodes normal. Neurologic: Alert and oriented X 3, normal strength and tone. Normal symmetric reflexes. Normal coordination and gait     Assessment:     (1) Medicare Wellness, Subsequent (2) Annual Physical Examination (3) Screening for Ischemic Heart Disease (4) Colon Cancer Screening (5) Hypertension (6) Prostate Cancer (7) Depression      Plan:     (1) During the course of the visit the patient was educated and counseled about  appropriate screening and preventive services including:    Td vaccine  Screening electrocardiogram  Colorectal cancer screening  Advanced directives: Forms given to patient and wife to review so decisions can be made  Diet review for nutrition referral? Yes ____  Not Indicated _x___  Patient refuses immunizations and colorectal cancer screening against medical advice.  (2) Examination without concerning findings. Will obtain fasting labs today.  (3) EKG reveals sinus ryhthm with rate of 72 bpm. Will obtain lipid panel today.  (4) Declines IFOB, Cologuard or Colonoscopy AMA.  (5) Well controlled. Asymptomatic. Continue current regimen  (6) Referral placed to outside Urology for second opinion regarding treatment options. Patient to obtain records from current Urologist to take to appointment.  (7) Will begin Lexapro 10 mg nightly. Potential side effects discussed with patient and wife. Counseling recommended but patient declines. Handout on [place] counseling services given to patient in case of change on heart. Follow-up 1 month. Return immediately or ER if anything worsens.  Patient Instructions (the written plan) was given to the patient.  Medicare Attestation I have personally reviewed: The patient's medical and social history Their use of alcohol, tobacco or illicit drugs Their current medications and supplements The patient's functional ability including ADLs,fall risks, home safety risks, cognitive, and hearing and visual impairment Diet and physical activities Evidence for depression or mood disorders  The patient's weight, height, BMI, and visual acuity have been recorded in the chart.  I have made referrals, counseling, and provided education to the patient based on review of the above and I have provided the patient with a written personalized care plan for preventive services.     Raiford Noble [place], [place]   10/22/2014

## 2014-10-28 ENCOUNTER — Telehealth: Payer: Self-pay | Admitting: *Deleted

## 2014-10-28 DIAGNOSIS — D72829 Elevated white blood cell count, unspecified: Secondary | ICD-10-CM

## 2014-10-28 NOTE — Telephone Encounter (Signed)
Called and spoke with the pt's wife and informed her of the pt's recent lab results and note.  The wife verbalized understanding and agreed.  Lab appt scheduled for Tues. (11-04-14 @ 10:15am), and future lab ordered and sent.//AB/CMA

## 2014-10-28 NOTE — Telephone Encounter (Signed)
-----   Message from [name], PA-C sent at 10/22/2014  7:50 AM EDT ----- Labs look great overall. WBC count slightly elevated. Want to recheck CBC in 2 weeks. He will be contacted by New Urologist -- absolutely needs to go see them giving recent diagnosis of prostate cancer.

## 2014-10-29 ENCOUNTER — Encounter: Payer: Commercial Managed Care - HMO | Admitting: Physician Assistant

## 2014-11-03 DIAGNOSIS — A150 Tuberculosis of lung: Secondary | ICD-10-CM | POA: Diagnosis not present

## 2014-11-03 DIAGNOSIS — R5383 Other fatigue: Secondary | ICD-10-CM | POA: Diagnosis not present

## 2014-11-03 DIAGNOSIS — M2550 Pain in unspecified joint: Secondary | ICD-10-CM | POA: Diagnosis not present

## 2014-11-03 DIAGNOSIS — M7989 Other specified soft tissue disorders: Secondary | ICD-10-CM | POA: Diagnosis not present

## 2014-11-03 DIAGNOSIS — M0589 Other rheumatoid arthritis with rheumatoid factor of multiple sites: Secondary | ICD-10-CM | POA: Diagnosis not present

## 2014-11-04 ENCOUNTER — Other Ambulatory Visit (INDEPENDENT_AMBULATORY_CARE_PROVIDER_SITE_OTHER): Payer: Commercial Managed Care - HMO

## 2014-11-04 DIAGNOSIS — D72829 Elevated white blood cell count, unspecified: Secondary | ICD-10-CM | POA: Diagnosis not present

## 2014-11-04 LAB — CBC WITH DIFFERENTIAL/PLATELET
Basophils Absolute: 0.1 10*3/uL (ref 0.0–0.1)
Basophils Relative: 0.6 % (ref 0.0–3.0)
Eosinophils Absolute: 0.2 10*3/uL (ref 0.0–0.7)
Eosinophils Relative: 1.7 % (ref 0.0–5.0)
HCT: 36.7 % — ABNORMAL LOW (ref 39.0–52.0)
Hemoglobin: 12.5 g/dL — ABNORMAL LOW (ref 13.0–17.0)
LYMPHS PCT: 21.4 % (ref 12.0–46.0)
Lymphs Abs: 2.5 10*3/uL (ref 0.7–4.0)
MCHC: 34 g/dL (ref 30.0–36.0)
MCV: 86.1 fl (ref 78.0–100.0)
MONOS PCT: 6.3 % (ref 3.0–12.0)
Monocytes Absolute: 0.7 10*3/uL (ref 0.1–1.0)
NEUTROS ABS: 8.3 10*3/uL — AB (ref 1.4–7.7)
NEUTROS PCT: 70 % (ref 43.0–77.0)
PLATELETS: 433 10*3/uL — AB (ref 150.0–400.0)
RBC: 4.26 Mil/uL (ref 4.22–5.81)
RDW: 14.6 % (ref 11.5–15.5)
WBC: 11.9 10*3/uL — ABNORMAL HIGH (ref 4.0–10.5)

## 2014-11-05 ENCOUNTER — Encounter: Payer: Self-pay | Admitting: *Deleted

## 2014-11-06 ENCOUNTER — Telehealth: Payer: Self-pay | Admitting: Physician Assistant

## 2014-11-06 DIAGNOSIS — E785 Hyperlipidemia, unspecified: Secondary | ICD-10-CM

## 2014-11-06 NOTE — Telephone Encounter (Signed)
Caller name: Sharee Pimple from [place] Relation to pt: Call back number: [phone] Pharmacy:  Reason for call:   Requesting atorvastatin refill for patient. Patient has been out for awhile

## 2014-11-07 MED ORDER — ATORVASTATIN CALCIUM 20 MG PO TABS: 20.0000 mg | ORAL_TABLET | Freq: Every day | ORAL | Status: DC

## 2014-11-07 NOTE — Telephone Encounter (Signed)
Rx sent to the pharmacy by e-script.//AB/CMA 

## 2014-11-18 ENCOUNTER — Ambulatory Visit: Payer: Commercial Managed Care - HMO | Admitting: Physician Assistant

## 2014-11-18 DIAGNOSIS — H211X2 Other vascular disorders of iris and ciliary body, left eye: Secondary | ICD-10-CM | POA: Diagnosis not present

## 2014-11-18 DIAGNOSIS — H40052 Ocular hypertension, left eye: Secondary | ICD-10-CM | POA: Diagnosis not present

## 2014-11-18 DIAGNOSIS — H34812 Central retinal vein occlusion, left eye: Secondary | ICD-10-CM | POA: Diagnosis not present

## 2014-11-21 ENCOUNTER — Ambulatory Visit (INDEPENDENT_AMBULATORY_CARE_PROVIDER_SITE_OTHER): Payer: Commercial Managed Care - HMO | Admitting: Physician Assistant

## 2014-11-21 ENCOUNTER — Encounter: Payer: Self-pay | Admitting: Physician Assistant

## 2014-11-21 VITALS — BP 104/56 | HR 71 | Temp 98.1°F | Ht 67.5 in | Wt 153.8 lb

## 2014-11-21 DIAGNOSIS — F329 Major depressive disorder, single episode, unspecified: Secondary | ICD-10-CM | POA: Insufficient documentation

## 2014-11-21 DIAGNOSIS — F32A Depression, unspecified: Secondary | ICD-10-CM

## 2014-11-21 NOTE — Progress Notes (Signed)
Pre visit review using our clinic review tool, if applicable. No additional management support is needed unless otherwise documented below in the visit note. 

## 2014-11-21 NOTE — Patient Instructions (Addendum)
Please call your Rheumatologist regarding the side effects you noted from the Methotrexate.  They will decide where to proceed. Do not take any further doses of medication.  Please take the Lexapro daily as directed. You have to take it daily before it will start to help with symptoms. Follow-up in 2 months after you have been taking daily. It will also hopefully help you get more restful sleep as it will help with stress/anxiety levels.

## 2014-11-21 NOTE — Assessment & Plan Note (Signed)
Reiterated to patient that SSRIs are not "as needed" medications. Patient to begin taking daily as directed. Supportive measures reviewed. Follow-up in 2 months.
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
## 2014-12-09 ENCOUNTER — Telehealth: Payer: Self-pay | Admitting: Physician Assistant

## 2014-12-09 DIAGNOSIS — H409 Unspecified glaucoma: Secondary | ICD-10-CM

## 2014-12-09 NOTE — Telephone Encounter (Signed)
Pt wife called asking for referral to [facility], Ph# [phone], Fax# [phone], for Glaucoma. She said she was told we need to include code [id number]

## 2014-12-10 NOTE — Telephone Encounter (Signed)
Referral placed.

## 2014-12-10 NOTE — Telephone Encounter (Signed)
Please advise.//AB/CMA 

## 2014-12-19 ENCOUNTER — Telehealth: Payer: Self-pay | Admitting: *Deleted

## 2014-12-19 DIAGNOSIS — I10 Essential (primary) hypertension: Secondary | ICD-10-CM

## 2014-12-19 MED ORDER — LISINOPRIL-HYDROCHLOROTHIAZIDE 10-12.5 MG PO TABS: 1.0000 | ORAL_TABLET | Freq: Every day | ORAL | Status: DC

## 2014-12-19 NOTE — Telephone Encounter (Signed)
Rx request to pharmacy/[facility]  

## 2014-12-30 ENCOUNTER — Other Ambulatory Visit: Payer: Self-pay | Admitting: Physician Assistant

## 2014-12-30 MED ORDER — AMLODIPINE BESYLATE 5 MG PO TABS: 2.5000 mg | ORAL_TABLET | Freq: Every day | ORAL | Status: DC

## 2015-01-23 ENCOUNTER — Ambulatory Visit: Payer: Self-pay | Admitting: Physician Assistant

## 2015-01-26 ENCOUNTER — Encounter: Payer: Self-pay | Admitting: Physician Assistant

## 2015-01-26 ENCOUNTER — Ambulatory Visit (INDEPENDENT_AMBULATORY_CARE_PROVIDER_SITE_OTHER): Payer: Commercial Managed Care - HMO | Admitting: Physician Assistant

## 2015-01-26 VITALS — BP 120/76 | HR 73 | Temp 98.1°F | Resp 16 | Ht 68.0 in | Wt 160.2 lb

## 2015-01-26 DIAGNOSIS — F32A Depression, unspecified: Secondary | ICD-10-CM

## 2015-01-26 DIAGNOSIS — M069 Rheumatoid arthritis, unspecified: Secondary | ICD-10-CM

## 2015-01-26 DIAGNOSIS — F329 Major depressive disorder, single episode, unspecified: Secondary | ICD-10-CM

## 2015-01-26 MED ORDER — ESCITALOPRAM OXALATE 20 MG PO TABS: 20.0000 mg | ORAL_TABLET | Freq: Every day | ORAL | Status: DC

## 2015-01-26 MED ORDER — HYDROCODONE-ACETAMINOPHEN 10-325 MG PO TABS: 1.0000 | ORAL_TABLET | Freq: Three times a day (TID) | ORAL | Status: DC | PRN

## 2015-01-26 NOTE — Assessment & Plan Note (Signed)
Rx short course of Norco for pain relief. Patient given number for his specialist to set up follow-up appointment.

## 2015-01-26 NOTE — Progress Notes (Signed)
Pre visit review using our clinic review tool, if applicable. No additional management support is needed unless otherwise documented below in the visit note/SLS  

## 2015-01-26 NOTE — Assessment & Plan Note (Signed)
Will increase Lexapro to 20 mg daily. Follow-up 3 months.
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
## 2015-01-26 NOTE — Patient Instructions (Signed)
Please start the new dose of Lexapro daily. Please continue the Mobic once daily only. Use the Norco given as directed for breakthrough pain.  Please call and schedule a follow-up visit with your Rheumatologist. Their number is [phone]  You will be contacted by another Urologist for assessment.

## 2015-02-25 ENCOUNTER — Ambulatory Visit (INDEPENDENT_AMBULATORY_CARE_PROVIDER_SITE_OTHER): Payer: Commercial Managed Care - HMO | Admitting: Physician Assistant

## 2015-02-25 VITALS — Ht 68.0 in | Wt 158.1 lb

## 2015-02-25 DIAGNOSIS — F329 Major depressive disorder, single episode, unspecified: Secondary | ICD-10-CM

## 2015-02-25 DIAGNOSIS — C61 Malignant neoplasm of prostate: Secondary | ICD-10-CM | POA: Diagnosis not present

## 2015-02-25 DIAGNOSIS — F32A Depression, unspecified: Secondary | ICD-10-CM

## 2015-02-25 DIAGNOSIS — M05741 Rheumatoid arthritis with rheumatoid factor of right hand without organ or systems involvement: Secondary | ICD-10-CM | POA: Diagnosis not present

## 2015-02-25 DIAGNOSIS — M05742 Rheumatoid arthritis with rheumatoid factor of left hand without organ or systems involvement: Secondary | ICD-10-CM

## 2015-02-25 DIAGNOSIS — I10 Essential (primary) hypertension: Secondary | ICD-10-CM

## 2015-02-25 MED ORDER — HYDROCODONE-ACETAMINOPHEN 10-325 MG PO TABS: 1.0000 | ORAL_TABLET | Freq: Three times a day (TID) | ORAL | Status: DC | PRN

## 2015-02-25 NOTE — Patient Instructions (Signed)
Please go to the lab for blood work. Continue medications as directed, including the pain medication. Call and schedule an appointment with your Rheumatologist ASAP. The RA is worsening.  You will be contacted for an urgent referral to Urology for 2nd opinion.

## 2015-02-25 NOTE — Progress Notes (Signed)
Pre visit review using our clinic review tool, if applicable. No additional management support is needed unless otherwise documented below in the visit note/SLS  
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
## 2015-02-26 LAB — BASIC METABOLIC PANEL
BUN: 20 mg/dL (ref 6–23)
CO2: 29 mEq/L (ref 19–32)
Calcium: 10.2 mg/dL (ref 8.4–10.5)
Chloride: 97 mEq/L (ref 96–112)
Creatinine, Ser: 1.45 mg/dL (ref 0.40–1.50)
GFR: 51.08 mL/min — AB (ref >60.00)
GLUCOSE: 93 mg/dL (ref 70–99)
POTASSIUM: 3.9 meq/L (ref 3.5–5.1)
SODIUM: 137 meq/L (ref 135–145)

## 2015-02-26 LAB — CBC
HCT: 40.2 % (ref 39.0–52.0)
Hemoglobin: 13.4 g/dL (ref 13.0–17.0)
MCHC: 33.2 g/dL (ref 30.0–36.0)
MCV: 86.9 fl (ref 78.0–100.0)
Platelets: 334 10*3/uL (ref 150.0–400.0)
RBC: 4.63 Mil/uL (ref 4.22–5.81)
RDW: 15.3 % (ref 11.5–15.5)
WBC: 13.7 10*3/uL — ABNORMAL HIGH (ref 4.0–10.5)

## 2015-03-02 ENCOUNTER — Encounter: Payer: Self-pay | Admitting: Physician Assistant

## 2015-03-02 NOTE — Assessment & Plan Note (Signed)
Well controlled. Asymptomatic °Continue current regimen °

## 2015-03-02 NOTE — Assessment & Plan Note (Signed)
Doing well overall. RA is a big factor due to pain. Will work on pain control. Patient to follow-up with Rheumatologist. Continue Lexapro.

## 2015-03-13 ENCOUNTER — Other Ambulatory Visit: Payer: Self-pay | Admitting: Physician Assistant

## 2015-03-13 ENCOUNTER — Other Ambulatory Visit (INDEPENDENT_AMBULATORY_CARE_PROVIDER_SITE_OTHER): Payer: Commercial Managed Care - HMO

## 2015-03-13 DIAGNOSIS — D72829 Elevated white blood cell count, unspecified: Secondary | ICD-10-CM

## 2015-03-13 LAB — CBC WITH DIFFERENTIAL/PLATELET
BASOS PCT: 1 % (ref 0–1)
Basophils Absolute: 0.1 10*3/uL (ref 0.0–0.1)
EOS ABS: 0.3 10*3/uL (ref 0.0–0.7)
EOS PCT: 3 % (ref 0–5)
HCT: 33.9 % — ABNORMAL LOW (ref 39.0–52.0)
Hemoglobin: 11.2 g/dL — ABNORMAL LOW (ref 13.0–17.0)
LYMPHS ABS: 2.6 10*3/uL (ref 0.7–4.0)
Lymphocytes Relative: 25 % (ref 12–46)
MCH: 27.7 pg (ref 26.0–34.0)
MCHC: 33 g/dL (ref 30.0–36.0)
MCV: 83.9 fL (ref 78.0–100.0)
MONOS PCT: 9 % (ref 3–12)
MPV: 9.5 fL (ref 8.6–12.4)
Monocytes Absolute: 0.9 10*3/uL (ref 0.1–1.0)
NEUTROS PCT: 62 % (ref 43–77)
Neutro Abs: 6.3 10*3/uL (ref 1.7–7.7)
PLATELETS: 432 10*3/uL — AB (ref 150–400)
RBC: 4.04 MIL/uL — ABNORMAL LOW (ref 4.22–5.81)
RDW: 14.4 % (ref 11.5–15.5)
WBC: 10.2 10*3/uL (ref 4.0–10.5)

## 2015-03-16 DIAGNOSIS — C61 Malignant neoplasm of prostate: Secondary | ICD-10-CM | POA: Diagnosis not present

## 2015-03-25 DIAGNOSIS — C61 Malignant neoplasm of prostate: Secondary | ICD-10-CM | POA: Diagnosis not present

## 2015-03-26 ENCOUNTER — Other Ambulatory Visit: Payer: Self-pay | Admitting: Urology

## 2015-03-26 DIAGNOSIS — C61 Malignant neoplasm of prostate: Secondary | ICD-10-CM

## 2015-04-10 ENCOUNTER — Encounter (HOSPITAL_COMMUNITY)
Admission: RE | Admit: 2015-04-10 | Discharge: 2015-04-10 | Disposition: A | Discharge status: Home / Self Care | Payer: Commercial Managed Care - HMO | Source: Ambulatory Visit | Attending: Urology | Admitting: Urology

## 2015-04-10 DIAGNOSIS — C61 Malignant neoplasm of prostate: Secondary | ICD-10-CM | POA: Diagnosis not present

## 2015-04-10 MED ORDER — TECHNETIUM TC 99M MEDRONATE IV KIT
27.2000 | PACK | Freq: Once | INTRAVENOUS | Status: AC | PRN
  Administered 2015-04-10: 27.2 via INTRAVENOUS

## 2015-04-14 ENCOUNTER — Other Ambulatory Visit: Payer: Self-pay | Admitting: Urology

## 2015-04-14 DIAGNOSIS — R590 Localized enlarged lymph nodes: Secondary | ICD-10-CM

## 2015-04-22 ENCOUNTER — Other Ambulatory Visit: Payer: Self-pay | Admitting: Radiology

## 2015-04-23 ENCOUNTER — Ambulatory Visit (HOSPITAL_COMMUNITY)
Admission: RE | Admit: 2015-04-23 | Discharge: 2015-04-23 | Disposition: A | Discharge status: Home / Self Care | Payer: Commercial Managed Care - HMO | Source: Ambulatory Visit | Attending: Urology | Admitting: Urology

## 2015-04-23 ENCOUNTER — Encounter (HOSPITAL_COMMUNITY): Payer: Self-pay

## 2015-04-23 DIAGNOSIS — C775 Secondary and unspecified malignant neoplasm of intrapelvic lymph nodes: Secondary | ICD-10-CM | POA: Insufficient documentation

## 2015-04-23 DIAGNOSIS — C61 Malignant neoplasm of prostate: Secondary | ICD-10-CM | POA: Diagnosis not present

## 2015-04-23 DIAGNOSIS — R590 Localized enlarged lymph nodes: Secondary | ICD-10-CM | POA: Diagnosis not present

## 2015-04-23 DIAGNOSIS — C801 Malignant (primary) neoplasm, unspecified: Secondary | ICD-10-CM | POA: Diagnosis not present

## 2015-04-23 DIAGNOSIS — R591 Generalized enlarged lymph nodes: Secondary | ICD-10-CM | POA: Diagnosis present

## 2015-04-23 LAB — CBC
HEMATOCRIT: 35.4 % — AB (ref 39.0–52.0)
HEMOGLOBIN: 12.1 g/dL — AB (ref 13.0–17.0)
MCH: 28.1 pg (ref 26.0–34.0)
MCHC: 34.2 g/dL (ref 30.0–36.0)
MCV: 82.1 fL (ref 78.0–100.0)
Platelets: 419 10*3/uL — ABNORMAL HIGH (ref 150–400)
RBC: 4.31 MIL/uL (ref 4.22–5.81)
RDW: 14.4 % (ref 11.5–15.5)
WBC: 10.6 10*3/uL — ABNORMAL HIGH (ref 4.0–10.5)

## 2015-04-23 LAB — PROTIME-INR
INR: 1.11 (ref 0.00–1.49)
Prothrombin Time: 14.4 seconds (ref 11.6–15.2)

## 2015-04-23 LAB — APTT: aPTT: 29 seconds (ref 24–37)

## 2015-04-23 MED ORDER — SODIUM CHLORIDE 0.9 % IV SOLN
Freq: Once | INTRAVENOUS | Status: AC
  Administered 2015-04-23: 14:00:00 via INTRAVENOUS

## 2015-04-23 MED ORDER — MIDAZOLAM HCL 2 MG/2ML IJ SOLN
INTRAMUSCULAR | Status: AC
  Filled 2015-04-23: qty 2

## 2015-04-23 MED ORDER — HYDROCODONE-ACETAMINOPHEN 5-325 MG PO TABS
1.0000 | ORAL_TABLET | ORAL | Status: DC | PRN
Start: 2015-04-23 — End: 2015-04-24

## 2015-04-23 MED ORDER — MIDAZOLAM HCL 2 MG/2ML IJ SOLN
INTRAMUSCULAR | Status: AC | PRN
  Administered 2015-04-23 (×2): 0.5 mg via INTRAVENOUS
  Administered 2015-04-23: 1 mg via INTRAVENOUS

## 2015-04-23 MED ORDER — FENTANYL CITRATE (PF) 100 MCG/2ML IJ SOLN
INTRAMUSCULAR | Status: AC
  Filled 2015-04-23: qty 2

## 2015-04-23 MED ORDER — LIDOCAINE HCL 1 % IJ SOLN
INTRAMUSCULAR | Status: AC
  Filled 2015-04-23: qty 20

## 2015-04-23 MED ORDER — FENTANYL CITRATE (PF) 100 MCG/2ML IJ SOLN
INTRAMUSCULAR | Status: AC | PRN
  Administered 2015-04-23: 25 ug via INTRAVENOUS
  Administered 2015-04-23: 50 ug via INTRAVENOUS
  Administered 2015-04-23 (×2): 25 ug via INTRAVENOUS

## 2015-04-23 NOTE — Discharge Instructions (Signed)
Needle Biopsy, Care After °These instructions give you information about caring for yourself after your procedure. Your doctor may also give you more specific instructions. Call your doctor if you have any problems or questions after your procedure. °HOME CARE °· Rest as told by your doctor. °· Take medicines only as told by your doctor. °· There are many different ways to close and cover the biopsy site, including stitches (sutures), skin glue, and adhesive strips. Follow instructions from your doctor about: °¨ How to take care of your biopsy site. °¨ When and how you should change your bandage (dressing). °¨ When you should remove your dressing. °¨ Removing whatever was used to close your biopsy site. °· Check your biopsy site every day for signs of infection. Watch for: °¨ Redness, swelling, or pain. °¨ Fluid, blood, or pus. °GET HELP IF: °· You have a fever. °· You have redness, swelling, or pain at the biopsy site, and it lasts longer than a few days. °· You have fluid, blood, or pus coming from the biopsy site. °· You feel sick to your stomach (nauseous). °· You throw up (vomit). °GET HELP RIGHT AWAY IF: °· You are short of breath. °· You have trouble breathing. °· Your chest hurts. °· You feel dizzy or you pass out (faint). °· You have bleeding that does not stop with pressure or a bandage. °· You cough up blood. °· Your belly (abdomen) hurts. °  °This information is not intended to replace advice given to you by your health care provider. Make sure you discuss any questions you have with your health care provider. °  °Document Released: 03/24/2008 Document Revised: 08/26/2014 Document Reviewed: 04/07/2014 °Elsevier Interactive Patient Education ©2016 [employer] ° °

## 2015-04-23 NOTE — Procedures (Signed)
CT guided core biopsy of right external iliac lymph node. 4 cores obtained and placed in saline.  No immediate complication.

## 2015-04-23 NOTE — Sedation Documentation (Signed)
Patient is resting comfortably. 
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
## 2015-05-11 ENCOUNTER — Other Ambulatory Visit: Payer: Self-pay | Admitting: *Deleted

## 2015-05-11 DIAGNOSIS — R591 Generalized enlarged lymph nodes: Secondary | ICD-10-CM | POA: Diagnosis not present

## 2015-05-11 DIAGNOSIS — C61 Malignant neoplasm of prostate: Secondary | ICD-10-CM | POA: Diagnosis not present

## 2015-05-11 DIAGNOSIS — E291 Testicular hypofunction: Secondary | ICD-10-CM | POA: Diagnosis not present

## 2015-05-11 MED ORDER — AMLODIPINE BESYLATE 5 MG PO TABS: 2.5000 mg | ORAL_TABLET | Freq: Every day | ORAL | Status: DC

## 2015-05-11 NOTE — Telephone Encounter (Signed)
Rx sent to the pharmacy by e-script.//AB/CMA 

## 2015-05-12 ENCOUNTER — Other Ambulatory Visit: Payer: Self-pay | Admitting: Urology

## 2015-05-12 DIAGNOSIS — Z7952 Long term (current) use of systemic steroids: Secondary | ICD-10-CM

## 2015-05-12 DIAGNOSIS — Z8546 Personal history of malignant neoplasm of prostate: Secondary | ICD-10-CM

## 2015-05-21 DIAGNOSIS — A150 Tuberculosis of lung: Secondary | ICD-10-CM | POA: Diagnosis not present

## 2015-05-21 DIAGNOSIS — M0579 Rheumatoid arthritis with rheumatoid factor of multiple sites without organ or systems involvement: Secondary | ICD-10-CM | POA: Diagnosis not present

## 2015-05-21 DIAGNOSIS — C61 Malignant neoplasm of prostate: Secondary | ICD-10-CM | POA: Diagnosis not present

## 2015-06-04 ENCOUNTER — Ambulatory Visit
Admission: RE | Admit: 2015-06-04 | Discharge: 2015-06-04 | Disposition: A | Discharge status: Home / Self Care | Payer: Commercial Managed Care - HMO | Source: Ambulatory Visit | Attending: Urology | Admitting: Urology

## 2015-06-04 DIAGNOSIS — Z8546 Personal history of malignant neoplasm of prostate: Secondary | ICD-10-CM

## 2015-06-04 DIAGNOSIS — Z13820 Encounter for screening for osteoporosis: Secondary | ICD-10-CM | POA: Diagnosis not present

## 2015-06-04 DIAGNOSIS — Z7952 Long term (current) use of systemic steroids: Secondary | ICD-10-CM

## 2015-06-11 DIAGNOSIS — M0579 Rheumatoid arthritis with rheumatoid factor of multiple sites without organ or systems involvement: Secondary | ICD-10-CM | POA: Diagnosis not present

## 2015-06-11 DIAGNOSIS — C61 Malignant neoplasm of prostate: Secondary | ICD-10-CM | POA: Diagnosis not present

## 2015-06-11 DIAGNOSIS — A150 Tuberculosis of lung: Secondary | ICD-10-CM | POA: Diagnosis not present

## 2015-06-11 DIAGNOSIS — Z79899 Other long term (current) drug therapy: Secondary | ICD-10-CM | POA: Diagnosis not present

## 2015-06-12 DIAGNOSIS — Z0000 Encounter for general adult medical examination without abnormal findings: Secondary | ICD-10-CM | POA: Diagnosis not present

## 2015-06-12 DIAGNOSIS — R591 Generalized enlarged lymph nodes: Secondary | ICD-10-CM | POA: Diagnosis not present

## 2015-06-12 DIAGNOSIS — Z79899 Other long term (current) drug therapy: Secondary | ICD-10-CM | POA: Diagnosis not present

## 2015-06-12 DIAGNOSIS — E291 Testicular hypofunction: Secondary | ICD-10-CM | POA: Diagnosis not present

## 2015-06-12 DIAGNOSIS — C61 Malignant neoplasm of prostate: Secondary | ICD-10-CM | POA: Diagnosis not present

## 2015-06-26 DIAGNOSIS — C61 Malignant neoplasm of prostate: Secondary | ICD-10-CM | POA: Diagnosis not present

## 2015-06-26 DIAGNOSIS — Z5111 Encounter for antineoplastic chemotherapy: Secondary | ICD-10-CM | POA: Diagnosis not present

## 2015-06-29 ENCOUNTER — Other Ambulatory Visit: Payer: Self-pay | Admitting: Physician Assistant

## 2015-06-29 MED ORDER — AMLODIPINE BESYLATE 2.5 MG PO TABS: 2.5000 mg | ORAL_TABLET | Freq: Every day | ORAL | Status: DC

## 2015-06-30 ENCOUNTER — Other Ambulatory Visit: Payer: Self-pay | Admitting: Physician Assistant

## 2015-06-30 ENCOUNTER — Other Ambulatory Visit: Payer: Self-pay | Admitting: *Deleted

## 2015-06-30 DIAGNOSIS — I10 Essential (primary) hypertension: Secondary | ICD-10-CM

## 2015-06-30 MED ORDER — LISINOPRIL-HYDROCHLOROTHIAZIDE 10-12.5 MG PO TABS: 1.0000 | ORAL_TABLET | Freq: Every day | ORAL | Status: DC

## 2015-06-30 MED ORDER — ESCITALOPRAM OXALATE 20 MG PO TABS: 10.0000 mg | ORAL_TABLET | Freq: Every day | ORAL | Status: DC

## 2015-06-30 NOTE — Telephone Encounter (Signed)
Rx's sent to the pharmacy by e-script.//AB/CMA 

## 2015-08-10 DIAGNOSIS — Z79899 Other long term (current) drug therapy: Secondary | ICD-10-CM | POA: Diagnosis not present

## 2015-08-10 DIAGNOSIS — C61 Malignant neoplasm of prostate: Secondary | ICD-10-CM | POA: Diagnosis not present

## 2015-08-10 DIAGNOSIS — M0579 Rheumatoid arthritis with rheumatoid factor of multiple sites without organ or systems involvement: Secondary | ICD-10-CM | POA: Diagnosis not present

## 2015-08-10 DIAGNOSIS — A150 Tuberculosis of lung: Secondary | ICD-10-CM | POA: Diagnosis not present

## 2015-08-10 DIAGNOSIS — Z7952 Long term (current) use of systemic steroids: Secondary | ICD-10-CM | POA: Diagnosis not present

## 2015-08-19 DIAGNOSIS — C61 Malignant neoplasm of prostate: Secondary | ICD-10-CM | POA: Diagnosis not present

## 2015-08-26 DIAGNOSIS — R591 Generalized enlarged lymph nodes: Secondary | ICD-10-CM | POA: Diagnosis not present

## 2015-08-26 DIAGNOSIS — Z79899 Other long term (current) drug therapy: Secondary | ICD-10-CM | POA: Diagnosis not present

## 2015-08-26 DIAGNOSIS — C61 Malignant neoplasm of prostate: Secondary | ICD-10-CM | POA: Diagnosis not present

## 2015-08-26 DIAGNOSIS — E291 Testicular hypofunction: Secondary | ICD-10-CM | POA: Diagnosis not present

## 2015-08-26 DIAGNOSIS — Z0000 Encounter for general adult medical examination without abnormal findings: Secondary | ICD-10-CM | POA: Diagnosis not present

## 2015-08-28 ENCOUNTER — Telehealth: Payer: Self-pay | Admitting: Physician Assistant

## 2015-08-28 DIAGNOSIS — Z7689 Persons encountering health services in other specified circumstances: Secondary | ICD-10-CM

## 2015-08-28 NOTE — Telephone Encounter (Signed)
Referrals pending/SLS 05/05

## 2015-08-28 NOTE — Telephone Encounter (Signed)
Caller name: [name] Relationship to patient: sister Can be reached: [phone] Pharmacy:  Reason for call: Pt will need new referrals/auth from [facility] for Urology Dr. Luberta Robertson, next OV 10/21/15, last OV 08/26/15  Also needs referral/auth from [facility] for Rheumatology Dr. Amil Amen, next OV 10/12/15

## 2015-08-30 NOTE — Telephone Encounter (Signed)
Signed.

## 2015-09-09 DIAGNOSIS — C61 Malignant neoplasm of prostate: Secondary | ICD-10-CM | POA: Diagnosis not present

## 2015-09-09 DIAGNOSIS — A150 Tuberculosis of lung: Secondary | ICD-10-CM | POA: Diagnosis not present

## 2015-09-09 DIAGNOSIS — Z79899 Other long term (current) drug therapy: Secondary | ICD-10-CM | POA: Diagnosis not present

## 2015-09-09 DIAGNOSIS — M0579 Rheumatoid arthritis with rheumatoid factor of multiple sites without organ or systems involvement: Secondary | ICD-10-CM | POA: Diagnosis not present

## 2015-09-09 DIAGNOSIS — R05 Cough: Secondary | ICD-10-CM | POA: Diagnosis not present

## 2015-09-09 DIAGNOSIS — Z7952 Long term (current) use of systemic steroids: Secondary | ICD-10-CM | POA: Diagnosis not present

## 2015-09-15 ENCOUNTER — Ambulatory Visit (INDEPENDENT_AMBULATORY_CARE_PROVIDER_SITE_OTHER): Payer: Commercial Managed Care - HMO | Admitting: Physician Assistant

## 2015-09-15 ENCOUNTER — Encounter: Payer: Self-pay | Admitting: Physician Assistant

## 2015-09-15 ENCOUNTER — Ambulatory Visit: Payer: Commercial Managed Care - HMO | Admitting: Physician Assistant

## 2015-09-15 VITALS — BP 100/60 | HR 75 | Temp 98.2°F | Ht 68.0 in | Wt 164.0 lb

## 2015-09-15 DIAGNOSIS — J189 Pneumonia, unspecified organism: Secondary | ICD-10-CM | POA: Diagnosis not present

## 2015-09-15 LAB — BASIC METABOLIC PANEL
BUN: 26 mg/dL — ABNORMAL HIGH (ref 6–23)
CALCIUM: 9.7 mg/dL (ref 8.4–10.5)
CO2: 28 mEq/L (ref 19–32)
Chloride: 101 mEq/L (ref 96–112)
Creatinine, Ser: 1.28 mg/dL (ref 0.40–1.50)
GFR: 58.9 mL/min — ABNORMAL LOW (ref >60.00)
GLUCOSE: 83 mg/dL (ref 70–99)
Potassium: 3.7 mEq/L (ref 3.5–5.1)
SODIUM: 137 meq/L (ref 135–145)

## 2015-09-15 LAB — CBC WITH DIFFERENTIAL/PLATELET
Basophils Absolute: 0.1 10*3/uL (ref 0.0–0.1)
Basophils Relative: 0.5 % (ref 0.0–3.0)
EOS PCT: 2.6 % (ref 0.0–5.0)
Eosinophils Absolute: 0.4 10*3/uL (ref 0.0–0.7)
HCT: 35.6 % — ABNORMAL LOW (ref 39.0–52.0)
HEMOGLOBIN: 11.8 g/dL — AB (ref 13.0–17.0)
LYMPHS PCT: 20.5 % (ref 12.0–46.0)
Lymphs Abs: 2.9 10*3/uL (ref 0.7–4.0)
MCHC: 33.2 g/dL (ref 30.0–36.0)
MCV: 86.6 fl (ref 78.0–100.0)
MONOS PCT: 7 % (ref 3.0–12.0)
Monocytes Absolute: 1 10*3/uL (ref 0.1–1.0)
Neutro Abs: 10 10*3/uL — ABNORMAL HIGH (ref 1.4–7.7)
Neutrophils Relative %: 69.4 % (ref 43.0–77.0)
Platelets: 395 10*3/uL (ref 150.0–400.0)
RBC: 4.11 Mil/uL — AB (ref 4.22–5.81)
RDW: 15.2 % (ref 11.5–15.5)
WBC: 14.4 10*3/uL — AB (ref 4.0–10.5)

## 2015-09-15 LAB — URINALYSIS, ROUTINE W REFLEX MICROSCOPIC
BILIRUBIN URINE: NEGATIVE
HGB URINE DIPSTICK: NEGATIVE
Ketones, ur: NEGATIVE
LEUKOCYTES UA: NEGATIVE
Nitrite: NEGATIVE
RBC / HPF: NONE SEEN (ref 0–?)
Specific Gravity, Urine: 1.01 (ref 1.000–1.030)
Total Protein, Urine: NEGATIVE
Urine Glucose: NEGATIVE
Urobilinogen, UA: 0.2 (ref 0.0–1.0)
WBC, UA: NONE SEEN (ref 0–?)
pH: 6 (ref 5.0–8.0)

## 2015-09-15 NOTE — Progress Notes (Signed)
Pre visit review using our clinic review tool, if applicable. No additional management support is needed unless otherwise documented below in the visit note. 

## 2015-09-15 NOTE — Patient Instructions (Signed)
I have reviewed your x-ray results from Rheumatology. No evidence of acute pneumonia. Shows chronic changes from previous lung history. I do feel they were right to treat as a pneumonia. Your exam today is great. Lungs sound clear.   Please stay well hydrated and get plenty of rest. Use mucinex to help with any lingering congestion. See how you do over the next 3-4 days. If symptoms are not continuing to resolve, please let me know.  We will recheck labs today to include rechecking kidney function.
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
## 2015-09-17 ENCOUNTER — Telehealth: Payer: Self-pay | Admitting: Physician Assistant

## 2015-09-17 NOTE — Telephone Encounter (Signed)
Caller name; Carlis Abbott (noted on DPR) Relation to [id number]  Call back [phone]    Reason for call:   Sister requesting most recent labs results please send to [facility]. [facility] and Dr. Leigh Aurora, Rheumatologist office.

## 2015-09-17 NOTE — Telephone Encounter (Signed)
Labs faxed as requested

## 2015-09-28 ENCOUNTER — Encounter: Payer: Self-pay | Admitting: Physician Assistant

## 2015-09-28 ENCOUNTER — Ambulatory Visit (INDEPENDENT_AMBULATORY_CARE_PROVIDER_SITE_OTHER): Payer: Commercial Managed Care - HMO | Admitting: Physician Assistant

## 2015-09-28 VITALS — BP 102/62 | HR 76 | Temp 98.2°F | Resp 16 | Ht 68.0 in | Wt 166.2 lb

## 2015-09-28 DIAGNOSIS — D72829 Elevated white blood cell count, unspecified: Secondary | ICD-10-CM | POA: Diagnosis not present

## 2015-09-28 LAB — CBC WITH DIFFERENTIAL/PLATELET
Basophils Absolute: 0.1 10*3/uL (ref 0.0–0.1)
Basophils Relative: 0.6 % (ref 0.0–3.0)
EOS ABS: 0.4 10*3/uL (ref 0.0–0.7)
Eosinophils Relative: 2.9 % (ref 0.0–5.0)
HCT: 33.1 % — ABNORMAL LOW (ref 39.0–52.0)
Hemoglobin: 11.1 g/dL — ABNORMAL LOW (ref 13.0–17.0)
LYMPHS ABS: 3.1 10*3/uL (ref 0.7–4.0)
Lymphocytes Relative: 22.8 % (ref 12.0–46.0)
MCHC: 33.4 g/dL (ref 30.0–36.0)
MCV: 87 fl (ref 78.0–100.0)
MONO ABS: 1.2 10*3/uL — AB (ref 0.1–1.0)
MONOS PCT: 8.6 % (ref 3.0–12.0)
NEUTROS ABS: 8.9 10*3/uL — AB (ref 1.4–7.7)
NEUTROS PCT: 65.1 % (ref 43.0–77.0)
PLATELETS: 390 10*3/uL (ref 150.0–400.0)
RBC: 3.81 Mil/uL — AB (ref 4.22–5.81)
RDW: 15.8 % — ABNORMAL HIGH (ref 11.5–15.5)
WBC: 13.7 10*3/uL — ABNORMAL HIGH (ref 4.0–10.5)

## 2015-09-28 NOTE — Patient Instructions (Signed)
Please go to the lab for blood work. I will cal you with your results.  Please continue chronic medications as directed but decrease lipitor to 1/2 tablet every other day. See how this helps with muscle/joint aches.  Continue your probiotic. Eat a bland diet. Let me know if nausea is not improving.

## 2015-09-28 NOTE — Progress Notes (Signed)
Pre visit review using our clinic review tool, if applicable. No additional management support is needed unless otherwise documented below in the visit note/SLS  
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
## 2015-10-02 ENCOUNTER — Other Ambulatory Visit: Payer: Self-pay | Admitting: Physician Assistant

## 2015-10-02 MED ORDER — AMLODIPINE BESYLATE 2.5 MG PO TABS: 2.5000 mg | ORAL_TABLET | Freq: Every day | ORAL | Status: DC

## 2015-10-12 DIAGNOSIS — C61 Malignant neoplasm of prostate: Secondary | ICD-10-CM | POA: Diagnosis not present

## 2015-10-12 DIAGNOSIS — M0579 Rheumatoid arthritis with rheumatoid factor of multiple sites without organ or systems involvement: Secondary | ICD-10-CM | POA: Diagnosis not present

## 2015-10-12 DIAGNOSIS — Z7952 Long term (current) use of systemic steroids: Secondary | ICD-10-CM | POA: Diagnosis not present

## 2015-10-12 DIAGNOSIS — A150 Tuberculosis of lung: Secondary | ICD-10-CM | POA: Diagnosis not present

## 2015-10-12 DIAGNOSIS — Z79899 Other long term (current) drug therapy: Secondary | ICD-10-CM | POA: Diagnosis not present

## 2015-10-12 DIAGNOSIS — R05 Cough: Secondary | ICD-10-CM | POA: Diagnosis not present

## 2015-10-21 DIAGNOSIS — C61 Malignant neoplasm of prostate: Secondary | ICD-10-CM | POA: Diagnosis not present

## 2015-10-28 DIAGNOSIS — C61 Malignant neoplasm of prostate: Secondary | ICD-10-CM | POA: Diagnosis not present

## 2015-10-30 ENCOUNTER — Ambulatory Visit (INDEPENDENT_AMBULATORY_CARE_PROVIDER_SITE_OTHER): Payer: Commercial Managed Care - HMO | Admitting: Physician Assistant

## 2015-10-30 ENCOUNTER — Ambulatory Visit (HOSPITAL_BASED_OUTPATIENT_CLINIC_OR_DEPARTMENT_OTHER)
Admission: RE | Admit: 2015-10-30 | Discharge: 2015-10-30 | Disposition: A | Discharge status: Home / Self Care | Payer: Commercial Managed Care - HMO | Source: Ambulatory Visit | Attending: Physician Assistant | Admitting: Physician Assistant

## 2015-10-30 ENCOUNTER — Encounter: Payer: Self-pay | Admitting: Physician Assistant

## 2015-10-30 VITALS — BP 131/77 | HR 87 | Temp 98.3°F | Resp 20 | Ht 68.0 in | Wt 172.2 lb

## 2015-10-30 DIAGNOSIS — E785 Hyperlipidemia, unspecified: Secondary | ICD-10-CM | POA: Diagnosis not present

## 2015-10-30 DIAGNOSIS — M25552 Pain in left hip: Secondary | ICD-10-CM | POA: Insufficient documentation

## 2015-10-30 MED ORDER — ATORVASTATIN CALCIUM 20 MG PO TABS: 20.0000 mg | ORAL_TABLET | ORAL | Status: DC

## 2015-10-30 MED ORDER — HYDROCODONE-ACETAMINOPHEN 10-325 MG PO TABS: 1.0000 | ORAL_TABLET | Freq: Three times a day (TID) | ORAL | Status: DC | PRN

## 2015-10-30 NOTE — Progress Notes (Signed)
Pre visit review using our clinic review tool, if applicable. No additional management support is needed unless otherwise documented below in the visit note/SLS  

## 2015-10-30 NOTE — Patient Instructions (Signed)
  Take the pain medication as directed. Topical OTC lidocaine patch will also offer some relief. The shingles rash is healing over and I do not feel it is related to current pain, giving the pain occurs with motion. No heavy lifting or overexertion.   We will call with CT results once available.
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
## 2015-11-03 ENCOUNTER — Other Ambulatory Visit: Payer: Self-pay | Admitting: Urology

## 2015-11-03 DIAGNOSIS — C61 Malignant neoplasm of prostate: Secondary | ICD-10-CM

## 2015-11-04 ENCOUNTER — Ambulatory Visit (INDEPENDENT_AMBULATORY_CARE_PROVIDER_SITE_OTHER): Payer: Commercial Managed Care - HMO | Admitting: Physician Assistant

## 2015-11-04 ENCOUNTER — Encounter: Payer: Self-pay | Admitting: Physician Assistant

## 2015-11-04 VITALS — BP 126/80 | HR 68 | Temp 98.1°F | Resp 16 | Ht 68.0 in | Wt 174.0 lb

## 2015-11-04 DIAGNOSIS — I10 Essential (primary) hypertension: Secondary | ICD-10-CM

## 2015-11-04 DIAGNOSIS — Z0000 Encounter for general adult medical examination without abnormal findings: Secondary | ICD-10-CM

## 2015-11-04 DIAGNOSIS — E785 Hyperlipidemia, unspecified: Secondary | ICD-10-CM | POA: Diagnosis not present

## 2015-11-04 LAB — CBC
HCT: 34.3 % — ABNORMAL LOW (ref 39.0–52.0)
Hemoglobin: 11.5 g/dL — ABNORMAL LOW (ref 13.0–17.0)
MCHC: 33.4 g/dL (ref 30.0–36.0)
MCV: 89.8 fl (ref 78.0–100.0)
PLATELETS: 365 10*3/uL (ref 150.0–400.0)
RBC: 3.82 Mil/uL — ABNORMAL LOW (ref 4.22–5.81)
RDW: 18.3 % — AB (ref 11.5–15.5)
WBC: 13.7 10*3/uL — ABNORMAL HIGH (ref 4.0–10.5)

## 2015-11-04 LAB — COMPREHENSIVE METABOLIC PANEL
ALT: 16 U/L (ref 0–53)
AST: 13 U/L (ref 0–37)
Albumin: 3.9 g/dL (ref 3.5–5.2)
Alkaline Phosphatase: 39 U/L (ref 39–117)
BILIRUBIN TOTAL: 0.4 mg/dL (ref 0.2–1.2)
BUN: 34 mg/dL — ABNORMAL HIGH (ref 6–23)
CALCIUM: 9.4 mg/dL (ref 8.4–10.5)
CHLORIDE: 96 meq/L (ref 96–112)
CO2: 30 meq/L (ref 19–32)
Creatinine, Ser: 1.31 mg/dL (ref 0.40–1.50)
GFR: 57.32 mL/min — AB (ref >60.00)
GLUCOSE: 86 mg/dL (ref 70–99)
Potassium: 3.4 mEq/L — ABNORMAL LOW (ref 3.5–5.1)
Sodium: 144 mEq/L (ref 135–145)
Total Protein: 6.6 g/dL (ref 6.0–8.3)

## 2015-11-04 LAB — URINALYSIS, ROUTINE W REFLEX MICROSCOPIC
Bilirubin Urine: NEGATIVE
Ketones, ur: NEGATIVE
Leukocytes, UA: NEGATIVE
Nitrite: NEGATIVE
PH: 5.5 (ref 5.0–8.0)
SPECIFIC GRAVITY, URINE: 1.02 (ref 1.000–1.030)
TOTAL PROTEIN, URINE-UPE24: NEGATIVE
URINE GLUCOSE: NEGATIVE
Urobilinogen, UA: 0.2 (ref 0.0–1.0)
WBC, UA: NONE SEEN (ref 0–?)

## 2015-11-04 LAB — LIPID PANEL
CHOL/HDL RATIO: 3
Cholesterol: 193 mg/dL (ref 0–200)
HDL: 65.3 mg/dL (ref >39.00)
LDL CALC: 101 mg/dL — AB (ref 0–99)
NONHDL: 127.39
TRIGLYCERIDES: 130 mg/dL (ref 0.0–149.0)
VLDL: 26 mg/dL (ref 0.0–40.0)

## 2015-11-04 MED ORDER — ATORVASTATIN CALCIUM 20 MG PO TABS: 20.0000 mg | ORAL_TABLET | ORAL | Status: DC

## 2015-11-04 NOTE — Patient Instructions (Signed)
Please go to the lab for blood work.   Our office will call you with your results unless you have chosen to receive results via MyChart.  If your blood work is normal we will follow-up each year for physicals and as scheduled for chronic medical problems.  If anything is abnormal we will treat accordingly and get you in for a follow-up.  Please continue medications as directed.  Preventive Care for Adults, Male A healthy lifestyle and preventive care can promote health and wellness. Preventive health guidelines for men include the following key practices:  A routine yearly physical is a good way to check with your health care provider about your health and preventative screening. It is a chance to share any concerns and updates on your health and to receive a thorough exam.  Visit your dentist for a routine exam and preventative care every 6 months. Brush your teeth twice a day and floss once a day. Good oral hygiene prevents tooth decay and gum disease.  The frequency of eye exams is based on your age, health, family medical history, use of contact lenses, and other factors. Follow your health care provider's recommendations for frequency of eye exams.  Eat a healthy diet. Foods such as vegetables, fruits, whole grains, low-fat dairy products, and lean protein foods contain the nutrients you need without too many calories. Decrease your intake of foods high in solid fats, added sugars, and salt. Eat the right amount of calories for you.Get information about a proper diet from your health care provider, if necessary.  Regular physical exercise is one of the most important things you can do for your health. Most adults should get at least 150 minutes of moderate-intensity exercise (any activity that increases your heart rate and causes you to sweat) each week. In addition, most adults need muscle-strengthening exercises on 2 or more days a week.  Maintain a healthy weight. The body mass index  (BMI) is a screening tool to identify possible weight problems. It provides an estimate of body fat based on height and weight. Your health care provider can find your BMI and can help you achieve or maintain a healthy weight.For adults 20 years and older:  A BMI below 18.5 is considered underweight.  A BMI of 18.5 to 24.9 is normal.  A BMI of 25 to 29.9 is considered overweight.  A BMI of 30 and above is considered obese.  Maintain normal blood lipids and cholesterol levels by exercising and minimizing your intake of saturated fat. Eat a balanced diet with plenty of fruit and vegetables. Blood tests for lipids and cholesterol should begin at age 20 and be repeated every 5 years. If your lipid or cholesterol levels are high, you are over 50, or you are at high risk for heart disease, you may need your cholesterol levels checked more frequently.Ongoing high lipid and cholesterol levels should be treated with medicines if diet and exercise are not working.  If you smoke, find out from your health care provider how to quit. If you do not use tobacco, do not start.  Lung cancer screening is recommended for adults aged 55-80 years who are at high risk for developing lung cancer because of a history of smoking. A yearly low-dose CT scan of the lungs is recommended for people who have at least a 30-pack-year history of smoking and are a current smoker or have quit within the past 15 years. A pack year of smoking is smoking an average of 1 pack   of cigarettes a day for 1 year (for example: 1 pack a day for 30 years or 2 packs a day for 15 years). Yearly screening should continue until the smoker has stopped smoking for at least 15 years. Yearly screening should be stopped for people who develop a health problem that would prevent them from having lung cancer treatment.  If you choose to drink alcohol, do not have more than 2 drinks per day. One drink is considered to be 12 ounces (355 mL) of beer, 5 ounces  (148 mL) of wine, or 1.5 ounces (44 mL) of liquor.  Avoid use of street drugs. Do not share needles with anyone. Ask for help if you need support or instructions about stopping the use of drugs.  High blood pressure causes heart disease and increases the risk of stroke. Your blood pressure should be checked at least every 1-2 years. Ongoing high blood pressure should be treated with medicines, if weight loss and exercise are not effective.  If you are 45-79 years old, ask your health care provider if you should take aspirin to prevent heart disease.  Diabetes screening is done by taking a blood sample to check your blood glucose level after you have not eaten for a certain period of time (fasting). If you are not overweight and you do not have risk factors for diabetes, you should be screened once every 3 years starting at age 45. If you are overweight or obese and you are 40-70 years of age, you should be screened for diabetes every year as part of your cardiovascular risk assessment.  Colorectal cancer can be detected and often prevented. Most routine colorectal cancer screening begins at the age of 50 and continues through age 75. However, your health care provider may recommend screening at an earlier age if you have risk factors for colon cancer. On a yearly basis, your health care provider may provide home test kits to check for hidden blood in the stool. Use of a small camera at the end of a tube to directly examine the colon (sigmoidoscopy or colonoscopy) can detect the earliest forms of colorectal cancer. Talk to your health care provider about this at age 50, when routine screening begins. Direct exam of the colon should be repeated every 5-10 years through age 75, unless early forms of precancerous polyps or small growths are found.  People who are at an increased risk for hepatitis B should be screened for this virus. You are considered at high risk for hepatitis B if:  You were born in a  country where hepatitis B occurs often. Talk with your health care provider about which countries are considered high risk.  Your parents were born in a high-risk country and you have not received a shot to protect against hepatitis B (hepatitis B vaccine).  You have HIV or AIDS.  You use needles to inject street drugs.  You live with, or have sex with, someone who has hepatitis B.  You are a man who has sex with other men (MSM).  You get hemodialysis treatment.  You take certain medicines for conditions such as cancer, organ transplantation, and autoimmune conditions.  Hepatitis C blood testing is recommended for all people born from 1945 through 1965 and any individual with known risks for hepatitis C.  Practice safe sex. Use condoms and avoid high-risk sexual practices to reduce the spread of sexually transmitted infections (STIs). STIs include gonorrhea, chlamydia, syphilis, trichomonas, herpes, HPV, and human immunodeficiency virus (HIV). Herpes,   HIV, and HPV are viral illnesses that have no cure. They can result in disability, cancer, and death.  If you are a man who has sex with other men, you should be screened at least once per year for:  HIV.  Urethral, rectal, and pharyngeal infection of gonorrhea, chlamydia, or both.  If you are at risk of being infected with HIV, it is recommended that you take a prescription medicine daily to prevent HIV infection. This is called preexposure prophylaxis (PrEP). You are considered at risk if:  You are a man who has sex with other men (MSM) and have other risk factors.  You are a heterosexual man, are sexually active, and are at increased risk for HIV infection.  You take drugs by injection.  You are sexually active with a partner who has HIV.  Talk with your health care provider about whether you are at high risk of being infected with HIV. If you choose to begin PrEP, you should first be tested for HIV. You should then be tested  every 3 months for as long as you are taking PrEP.  A one-time screening for abdominal aortic aneurysm (AAA) and surgical repair of large AAAs by ultrasound are recommended for men ages 1 to 33 years who are current or former smokers.  Healthy men should no longer receive prostate-specific antigen (PSA) blood tests as part of routine cancer screening. Talk with your health care provider about prostate cancer screening.  Testicular cancer screening is not recommended for adult males who have no symptoms. Screening includes self-exam, a health care provider exam, and other screening tests. Consult with your health care provider about any symptoms you have or any concerns you have about testicular cancer.  Use sunscreen. Apply sunscreen liberally and repeatedly throughout the day. You should seek shade when your shadow is shorter than you. Protect yourself by wearing long sleeves, pants, a wide-brimmed hat, and sunglasses year round, whenever you are outdoors.  Once a month, do a whole-body skin exam, using a mirror to look at the skin on your back. Tell your health care provider about new moles, moles that have irregular borders, moles that are larger than a pencil eraser, or moles that have changed in shape or color.  Stay current with required vaccines (immunizations).  Influenza vaccine. All adults should be immunized every year.  Tetanus, diphtheria, and acellular pertussis (Td, Tdap) vaccine. An adult who has not previously received Tdap or who does not know his vaccine status should receive 1 dose of Tdap. This initial dose should be followed by tetanus and diphtheria toxoids (Td) booster doses every 10 years. Adults with an unknown or incomplete history of completing a 3-dose immunization series with Td-containing vaccines should begin or complete a primary immunization series including a Tdap dose. Adults should receive a Td booster every 10 years.  Varicella vaccine. An adult without  evidence of immunity to varicella should receive 2 doses or a second dose if he has previously received 1 dose.  Human papillomavirus (HPV) vaccine. Males aged 11-21 years who have not received the vaccine previously should receive the 3-dose series. Males aged 22-26 years may be immunized. Immunization is recommended through the age of 49 years for any male who has sex with males and did not get any or all doses earlier. Immunization is recommended for any person with an immunocompromised condition through the age of 54 years if he did not get any or all doses earlier. During the 3-dose series, the second  dose should be obtained 4-8 weeks after the first dose. The third dose should be obtained 24 weeks after the first dose and 16 weeks after the second dose.  Zoster vaccine. One dose is recommended for adults aged 20 years or older unless certain conditions are present.  Measles, mumps, and rubella (MMR) vaccine. Adults born before 40 generally are considered immune to measles and mumps. Adults born in [birth year] or later should have 1 or more doses of MMR vaccine unless there is a contraindication to the vaccine or there is laboratory evidence of immunity to each of the three diseases. A routine second dose of MMR vaccine should be obtained at least 28 days after the first dose for students attending postsecondary schools, health care workers, or international travelers. People who received inactivated measles vaccine or an unknown type of measles vaccine during 1963-1967 should receive 2 doses of MMR vaccine. People who received inactivated mumps vaccine or an unknown type of mumps vaccine before 1979 and are at high risk for mumps infection should consider immunization with 2 doses of MMR vaccine. Unvaccinated health care workers born before [birth year] who lack laboratory evidence of measles, mumps, or rubella immunity or laboratory confirmation of disease should consider measles and mumps immunization with 2 doses  of MMR vaccine or rubella immunization with 1 dose of MMR vaccine.  Pneumococcal 13-valent conjugate (PCV13) vaccine. When indicated, a person who is uncertain of his immunization history and has no record of immunization should receive the PCV13 vaccine. All adults 39 years of age and older should receive this vaccine. An adult aged 35 years or older who has certain medical conditions and has not been previously immunized should receive 1 dose of PCV13 vaccine. This PCV13 should be followed with a dose of pneumococcal polysaccharide (PPSV23) vaccine. Adults who are at high risk for pneumococcal disease should obtain the PPSV23 vaccine at least 8 weeks after the dose of PCV13 vaccine. Adults older than 71 years of age who have normal immune system function should obtain the PPSV23 vaccine dose at least 1 year after the dose of PCV13 vaccine.  Pneumococcal polysaccharide (PPSV23) vaccine. When PCV13 is also indicated, PCV13 should be obtained first. All adults aged 66 years and older should be immunized. An adult younger than age 26 years who has certain medical conditions should be immunized. Any person who resides in a nursing home or long-term care facility should be immunized. An adult smoker should be immunized. People with an immunocompromised condition and certain other conditions should receive both PCV13 and PPSV23 vaccines. People with human immunodeficiency virus (HIV) infection should be immunized as soon as possible after diagnosis. Immunization during chemotherapy or radiation therapy should be avoided. Routine use of PPSV23 vaccine is not recommended for American Indians, [place] Natives, or people younger than 65 years unless there are medical conditions that require PPSV23 vaccine. When indicated, people who have unknown immunization and have no record of immunization should receive PPSV23 vaccine. One-time revaccination 5 years after the first dose of PPSV23 is recommended for people aged 19-64  years who have chronic kidney failure, nephrotic syndrome, asplenia, or immunocompromised conditions. People who received 1-2 doses of PPSV23 before age 84 years should receive another dose of PPSV23 vaccine at age 63 years or later if at least 5 years have passed since the previous dose. Doses of PPSV23 are not needed for people immunized with PPSV23 at or after age 50 years.  Meningococcal vaccine. Adults with asplenia or persistent complement component deficiencies  should receive 2 doses of quadrivalent meningococcal conjugate (MenACWY-D) vaccine. The doses should be obtained at least 2 months apart. Microbiologists working with certain meningococcal bacteria, [employer] recruits, people at risk during an outbreak, and people who travel to or live in countries with a high rate of meningitis should be immunized. A first-year college student up through age 78 years who is living in a residence hall should receive a dose if he did not receive a dose on or after his 16th birthday. Adults who have certain high-risk conditions should receive one or more doses of vaccine.  Hepatitis A vaccine. Adults who wish to be protected from this disease, have chronic liver disease, work with hepatitis A-infected animals, work in hepatitis A research labs, or travel to or work in countries with a high rate of hepatitis A should be immunized. Adults who were previously unvaccinated and who anticipate close contact with an international adoptee during the first 60 days after arrival in the [place] from a country with a high rate of hepatitis A should be immunized.  Hepatitis B vaccine. Adults should be immunized if they wish to be protected from this disease, are under age 29 years and have diabetes, have chronic liver disease, have had more than one sex partner in the past 6 months, may be exposed to blood or other infectious body fluids, are household contacts or sex partners of hepatitis B positive people, are clients or  workers in certain care facilities, or travel to or work in countries with a high rate of hepatitis B.  Haemophilus influenzae type b (Hib) vaccine. A previously unvaccinated person with asplenia or sickle cell disease or having a scheduled splenectomy should receive 1 dose of Hib vaccine. Regardless of previous immunization, a recipient of a hematopoietic stem cell transplant should receive a 3-dose series 6-12 months after his successful transplant. Hib vaccine is not recommended for adults with HIV infection. Preventive Service / Frequency Ages 41 to 42  Blood pressure check.** / Every 3-5 years.  Lipid and cholesterol check.** / Every 5 years beginning at age 73.  Hepatitis C blood test.** / For any individual with known risks for hepatitis C.  Skin self-exam. / Monthly.  Influenza vaccine. / Every year.  Tetanus, diphtheria, and acellular pertussis (Tdap, Td) vaccine.** / Consult your health care provider. 1 dose of Td every 10 years.  Varicella vaccine.** / Consult your health care provider.  HPV vaccine. / 3 doses over 6 months, if 90 or younger.  Measles, mumps, rubella (MMR) vaccine.** / You need at least 1 dose of MMR if you were born in 1957 or later. You may also need a second dose.  Pneumococcal 13-valent conjugate (PCV13) vaccine.** / Consult your health care provider.  Pneumococcal polysaccharide (PPSV23) vaccine.** / 1 to 2 doses if you smoke cigarettes or if you have certain conditions.  Meningococcal vaccine.** / 1 dose if you are age 77 to 27 years and a Market researcher living in a residence hall, or have one of several medical conditions. You may also need additional booster doses.  Hepatitis A vaccine.** / Consult your health care provider.  Hepatitis B vaccine.** / Consult your health care provider.  Haemophilus influenzae type b (Hib) vaccine.** / Consult your health care provider. Ages 63 to 50  Blood pressure check.** / Every year.  Lipid and  cholesterol check.** / Every 5 years beginning at age 50.  Lung cancer screening. / Every year if you are aged 51-80 years and  have a 30-pack-year history of smoking and currently smoke or have quit within the past 15 years. Yearly screening is stopped once you have quit smoking for at least 15 years or develop a health problem that would prevent you from having lung cancer treatment.  Fecal occult blood test (FOBT) of stool. / Every year beginning at age 20 and continuing until age 27. You may not have to do this test if you get a colonoscopy every 10 years.  Flexible sigmoidoscopy** or colonoscopy.** / Every 5 years for a flexible sigmoidoscopy or every 10 years for a colonoscopy beginning at age 73 and continuing until age 82.  Hepatitis C blood test.** / For all people born from [birth year] through 1965 and any individual with known risks for hepatitis C.  Skin self-exam. / Monthly.  Influenza vaccine. / Every year.  Tetanus, diphtheria, and acellular pertussis (Tdap/Td) vaccine.** / Consult your health care provider. 1 dose of Td every 10 years.  Varicella vaccine.** / Consult your health care provider.  Zoster vaccine.** / 1 dose for adults aged 17 years or older.  Measles, mumps, rubella (MMR) vaccine.** / You need at least 1 dose of MMR if you were born in 1957 or later. You may also need a second dose.  Pneumococcal 13-valent conjugate (PCV13) vaccine.** / Consult your health care provider.  Pneumococcal polysaccharide (PPSV23) vaccine.** / 1 to 2 doses if you smoke cigarettes or if you have certain conditions.  Meningococcal vaccine.** / Consult your health care provider.  Hepatitis A vaccine.** / Consult your health care provider.  Hepatitis B vaccine.** / Consult your health care provider.  Haemophilus influenzae type b (Hib) vaccine.** / Consult your health care provider. Ages 27 and over  Blood pressure check.** / Every year.  Lipid and cholesterol check.**/ Every 5 years  beginning at age 14.  Lung cancer screening. / Every year if you are aged 57-80 years and have a 30-pack-year history of smoking and currently smoke or have quit within the past 15 years. Yearly screening is stopped once you have quit smoking for at least 15 years or develop a health problem that would prevent you from having lung cancer treatment.  Fecal occult blood test (FOBT) of stool. / Every year beginning at age 36 and continuing until age 33. You may not have to do this test if you get a colonoscopy every 10 years.  Flexible sigmoidoscopy** or colonoscopy.** / Every 5 years for a flexible sigmoidoscopy or every 10 years for a colonoscopy beginning at age 30 and continuing until age 38.  Hepatitis C blood test.** / For all people born from [birth year] through 1965 and any individual with known risks for hepatitis C.  Abdominal aortic aneurysm (AAA) screening.** / A one-time screening for ages 30 to 47 years who are current or former smokers.  Skin self-exam. / Monthly.  Influenza vaccine. / Every year.  Tetanus, diphtheria, and acellular pertussis (Tdap/Td) vaccine.** / 1 dose of Td every 10 years.  Varicella vaccine.** / Consult your health care provider.  Zoster vaccine.** / 1 dose for adults aged 35 years or older.  Pneumococcal 13-valent conjugate (PCV13) vaccine.** / 1 dose for all adults aged 1 years and older.  Pneumococcal polysaccharide (PPSV23) vaccine.** / 1 dose for all adults aged 63 years and older.  Meningococcal vaccine.** / Consult your health care provider.  Hepatitis A vaccine.** / Consult your health care provider.  Hepatitis B vaccine.** / Consult your health care provider.  Haemophilus influenzae type b (  Hib) vaccine.** / Consult your health care provider. **Family history and personal history of risk and conditions may change your health care provider's recommendations.   This information is not intended to replace advice given to you by your health care  provider. Make sure you discuss any questions you have with your health care provider.   Document Released: 06/07/2001 Document Revised: 05/02/2014 Document Reviewed: 09/06/2010 [employer] Interactive Patient Education 2016 [employer]        
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
## 2015-11-05 NOTE — Addendum Note (Signed)
Addended by: Tasia Catchings on: 11/05/2015 12:24 PM   Modules accepted: Miquel Dunn

## 2015-11-13 ENCOUNTER — Encounter (HOSPITAL_COMMUNITY)
Admission: RE | Admit: 2015-11-13 | Discharge: 2015-11-13 | Disposition: A | Discharge status: Home / Self Care | Payer: Commercial Managed Care - HMO | Source: Ambulatory Visit | Attending: Urology | Admitting: Urology

## 2015-11-13 DIAGNOSIS — C61 Malignant neoplasm of prostate: Secondary | ICD-10-CM | POA: Insufficient documentation

## 2015-11-13 MED ORDER — TECHNETIUM TC 99M MEDRONATE IV KIT: 22.1000 | PACK | Freq: Once | INTRAVENOUS | Status: DC | PRN

## 2015-11-16 ENCOUNTER — Other Ambulatory Visit: Payer: Commercial Managed Care - HMO

## 2015-12-14 DIAGNOSIS — Z7952 Long term (current) use of systemic steroids: Secondary | ICD-10-CM | POA: Diagnosis not present

## 2015-12-14 DIAGNOSIS — C61 Malignant neoplasm of prostate: Secondary | ICD-10-CM | POA: Diagnosis not present

## 2015-12-14 DIAGNOSIS — Z79899 Other long term (current) drug therapy: Secondary | ICD-10-CM | POA: Diagnosis not present

## 2015-12-14 DIAGNOSIS — R202 Paresthesia of skin: Secondary | ICD-10-CM | POA: Diagnosis not present

## 2015-12-14 DIAGNOSIS — A150 Tuberculosis of lung: Secondary | ICD-10-CM | POA: Diagnosis not present

## 2015-12-14 DIAGNOSIS — M0579 Rheumatoid arthritis with rheumatoid factor of multiple sites without organ or systems involvement: Secondary | ICD-10-CM | POA: Diagnosis not present

## 2015-12-30 ENCOUNTER — Telehealth: Payer: Self-pay | Admitting: *Deleted

## 2015-12-30 DIAGNOSIS — I10 Essential (primary) hypertension: Secondary | ICD-10-CM

## 2015-12-30 MED ORDER — LISINOPRIL-HYDROCHLOROTHIAZIDE 10-12.5 MG PO TABS: 1.0000 | ORAL_TABLET | Freq: Every day | ORAL | 1 refills | Status: DC

## 2015-12-30 NOTE — Telephone Encounter (Signed)
Rx request to pharmacy/[facility]  

## 2016-02-15 ENCOUNTER — Telehealth: Payer: Self-pay | Admitting: Physician Assistant

## 2016-02-15 DIAGNOSIS — M05742 Rheumatoid arthritis with rheumatoid factor of left hand without organ or systems involvement: Principal | ICD-10-CM

## 2016-02-15 DIAGNOSIS — M0579 Rheumatoid arthritis with rheumatoid factor of multiple sites without organ or systems involvement: Secondary | ICD-10-CM | POA: Diagnosis not present

## 2016-02-15 DIAGNOSIS — Z79899 Other long term (current) drug therapy: Secondary | ICD-10-CM | POA: Diagnosis not present

## 2016-02-15 DIAGNOSIS — M5416 Radiculopathy, lumbar region: Secondary | ICD-10-CM | POA: Diagnosis not present

## 2016-02-15 DIAGNOSIS — C61 Malignant neoplasm of prostate: Secondary | ICD-10-CM | POA: Diagnosis not present

## 2016-02-15 DIAGNOSIS — Z7952 Long term (current) use of systemic steroids: Secondary | ICD-10-CM | POA: Diagnosis not present

## 2016-02-15 DIAGNOSIS — A150 Tuberculosis of lung: Secondary | ICD-10-CM | POA: Diagnosis not present

## 2016-02-15 DIAGNOSIS — M05741 Rheumatoid arthritis with rheumatoid factor of right hand without organ or systems involvement: Secondary | ICD-10-CM

## 2016-02-15 DIAGNOSIS — R202 Paresthesia of skin: Secondary | ICD-10-CM | POA: Diagnosis not present

## 2016-02-15 NOTE — Telephone Encounter (Signed)
Patient is at Rheumatology now [Dr. Amil Amen and Insurance is requiring new updated certification per pt's spouse [[name]]/SLS 10/23

## 2016-02-15 NOTE — Telephone Encounter (Signed)
Referral placed by provider/SLS 10/23

## 2016-02-15 NOTE — Telephone Encounter (Signed)
Caller name: Carlis Abbott  Relation to [id number]  Call back number: [phone]    Reason for call:    requesting referral to rheumatologist

## 2016-02-22 ENCOUNTER — Telehealth: Payer: Self-pay | Admitting: Physician Assistant

## 2016-02-22 DIAGNOSIS — C61 Malignant neoplasm of prostate: Secondary | ICD-10-CM | POA: Diagnosis not present

## 2016-02-22 NOTE — Telephone Encounter (Signed)
Referral has been placed. They should be able to call and schedule a follow-up appointment.

## 2016-02-22 NOTE — Telephone Encounter (Signed)
Spoke with spouse, [name], to verify if pt needed Referral to [facility] in [place] or [place]; as he has had both in the past year. Per [name], Referral needed to Dr. Diona Fanti at [facility] in [place] for Insurance purposes/SLS 10/30

## 2016-02-22 NOTE — Telephone Encounter (Signed)
Relation to [id number] Call back [phone]   Reason for call:  Patient requesting a referral to [facility] patient has a scheduled prostate check appotiment for 03/02/16.

## 2016-02-23 NOTE — Telephone Encounter (Signed)
Patient has appointment 03/02/16.SLS 10/31

## 2016-03-02 ENCOUNTER — Other Ambulatory Visit: Payer: Self-pay | Admitting: *Deleted

## 2016-03-02 DIAGNOSIS — R351 Nocturia: Secondary | ICD-10-CM | POA: Diagnosis not present

## 2016-03-02 DIAGNOSIS — Z79899 Other long term (current) drug therapy: Secondary | ICD-10-CM | POA: Diagnosis not present

## 2016-03-02 DIAGNOSIS — C61 Malignant neoplasm of prostate: Secondary | ICD-10-CM | POA: Diagnosis not present

## 2016-03-02 DIAGNOSIS — E291 Testicular hypofunction: Secondary | ICD-10-CM | POA: Diagnosis not present

## 2016-03-02 MED ORDER — AMLODIPINE BESYLATE 2.5 MG PO TABS: 2.5000 mg | ORAL_TABLET | Freq: Every day | ORAL | 1 refills | Status: DC

## 2016-03-02 MED ORDER — ESCITALOPRAM OXALATE 20 MG PO TABS: 10.0000 mg | ORAL_TABLET | Freq: Every day | ORAL | 3 refills | Status: DC

## 2016-03-02 NOTE — Progress Notes (Signed)
Per fax request from pharmacy, Rx sent/SLS 11/08

## 2016-03-28 DIAGNOSIS — M0579 Rheumatoid arthritis with rheumatoid factor of multiple sites without organ or systems involvement: Secondary | ICD-10-CM | POA: Diagnosis not present

## 2016-04-01 ENCOUNTER — Other Ambulatory Visit: Payer: Self-pay | Admitting: Emergency Medicine

## 2016-04-01 MED ORDER — ESCITALOPRAM OXALATE 20 MG PO TABS: 10.0000 mg | ORAL_TABLET | Freq: Every day | ORAL | 1 refills | Status: DC

## 2016-04-11 ENCOUNTER — Ambulatory Visit (INDEPENDENT_AMBULATORY_CARE_PROVIDER_SITE_OTHER): Payer: Commercial Managed Care - HMO | Admitting: Physician Assistant

## 2016-04-11 ENCOUNTER — Encounter: Payer: Self-pay | Admitting: Physician Assistant

## 2016-04-11 VITALS — BP 130/80 | HR 74 | Temp 98.4°F | Resp 16 | Ht 68.0 in | Wt 182.0 lb

## 2016-04-11 DIAGNOSIS — F32A Depression, unspecified: Secondary | ICD-10-CM

## 2016-04-11 DIAGNOSIS — I10 Essential (primary) hypertension: Secondary | ICD-10-CM | POA: Diagnosis not present

## 2016-04-11 DIAGNOSIS — F329 Major depressive disorder, single episode, unspecified: Secondary | ICD-10-CM

## 2016-04-11 DIAGNOSIS — E785 Hyperlipidemia, unspecified: Secondary | ICD-10-CM | POA: Diagnosis not present

## 2016-04-11 LAB — COMPREHENSIVE METABOLIC PANEL
ALBUMIN: 4.6 g/dL (ref 3.5–5.2)
ALK PHOS: 48 U/L (ref 39–117)
ALT: 26 U/L (ref 0–53)
AST: 16 U/L (ref 0–37)
BILIRUBIN TOTAL: 0.4 mg/dL (ref 0.2–1.2)
BUN: 21 mg/dL (ref 6–23)
CALCIUM: 10.2 mg/dL (ref 8.4–10.5)
CHLORIDE: 99 meq/L (ref 96–112)
CO2: 31 mEq/L (ref 19–32)
CREATININE: 1.31 mg/dL (ref 0.40–1.50)
GFR: 57.25 mL/min — ABNORMAL LOW (ref >60.00)
Glucose, Bld: 109 mg/dL — ABNORMAL HIGH (ref 70–99)
Potassium: 4.4 mEq/L (ref 3.5–5.1)
SODIUM: 140 meq/L (ref 135–145)
TOTAL PROTEIN: 7 g/dL (ref 6.0–8.3)

## 2016-04-11 MED ORDER — AMLODIPINE BESYLATE 2.5 MG PO TABS: 2.5000 mg | ORAL_TABLET | Freq: Every day | ORAL | 1 refills | Status: DC

## 2016-04-11 MED ORDER — LISINOPRIL-HYDROCHLOROTHIAZIDE 10-12.5 MG PO TABS: 1.0000 | ORAL_TABLET | Freq: Every day | ORAL | 1 refills | Status: DC

## 2016-04-11 MED ORDER — HYDROCODONE-ACETAMINOPHEN 10-325 MG PO TABS: 1.0000 | ORAL_TABLET | Freq: Three times a day (TID) | ORAL | 0 refills | Status: DC | PRN

## 2016-04-11 NOTE — Progress Notes (Signed)
Pre visit review using our clinic review tool, if applicable. No additional management support is needed unless otherwise documented below in the visit note. 
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
## 2016-04-11 NOTE — Patient Instructions (Signed)
Please continue medications as directed. Stop by the lab for blood work. I will call you with your results.  Follow-up will be based on results.

## 2016-04-12 ENCOUNTER — Encounter: Payer: Self-pay | Admitting: Emergency Medicine

## 2016-04-17 DIAGNOSIS — E785 Hyperlipidemia, unspecified: Secondary | ICD-10-CM | POA: Insufficient documentation

## 2016-04-17 NOTE — Assessment & Plan Note (Signed)
BP stable. Asymptomatic. Will check labs today. Continue current regimen.

## 2016-04-17 NOTE — Assessment & Plan Note (Signed)
Doing very well with current medication regimen.  Will continue current treatment. FU 6 months.

## 2016-05-17 DIAGNOSIS — M0579 Rheumatoid arthritis with rheumatoid factor of multiple sites without organ or systems involvement: Secondary | ICD-10-CM | POA: Diagnosis not present

## 2016-05-17 DIAGNOSIS — M79605 Pain in left leg: Secondary | ICD-10-CM | POA: Diagnosis not present

## 2016-05-17 DIAGNOSIS — A150 Tuberculosis of lung: Secondary | ICD-10-CM | POA: Diagnosis not present

## 2016-05-17 DIAGNOSIS — C61 Malignant neoplasm of prostate: Secondary | ICD-10-CM | POA: Diagnosis not present

## 2016-05-17 DIAGNOSIS — M5416 Radiculopathy, lumbar region: Secondary | ICD-10-CM | POA: Diagnosis not present

## 2016-05-17 DIAGNOSIS — R202 Paresthesia of skin: Secondary | ICD-10-CM | POA: Diagnosis not present

## 2016-05-17 DIAGNOSIS — Z7952 Long term (current) use of systemic steroids: Secondary | ICD-10-CM | POA: Diagnosis not present

## 2016-05-17 DIAGNOSIS — Z79899 Other long term (current) drug therapy: Secondary | ICD-10-CM | POA: Diagnosis not present

## 2016-05-17 DIAGNOSIS — Z6829 Body mass index (BMI) 29.0-29.9, adult: Secondary | ICD-10-CM | POA: Diagnosis not present

## 2016-06-22 DIAGNOSIS — H524 Presbyopia: Secondary | ICD-10-CM | POA: Diagnosis not present

## 2016-06-22 DIAGNOSIS — H4089 Other specified glaucoma: Secondary | ICD-10-CM | POA: Diagnosis not present

## 2016-06-22 DIAGNOSIS — H40052 Ocular hypertension, left eye: Secondary | ICD-10-CM | POA: Diagnosis not present

## 2016-06-29 DIAGNOSIS — C61 Malignant neoplasm of prostate: Secondary | ICD-10-CM | POA: Diagnosis not present

## 2016-07-06 DIAGNOSIS — C61 Malignant neoplasm of prostate: Secondary | ICD-10-CM | POA: Diagnosis not present

## 2016-07-06 DIAGNOSIS — E291 Testicular hypofunction: Secondary | ICD-10-CM | POA: Diagnosis not present

## 2016-07-06 DIAGNOSIS — C775 Secondary and unspecified malignant neoplasm of intrapelvic lymph nodes: Secondary | ICD-10-CM | POA: Diagnosis not present

## 2016-07-06 DIAGNOSIS — Z79899 Other long term (current) drug therapy: Secondary | ICD-10-CM | POA: Diagnosis not present

## 2016-07-06 DIAGNOSIS — R351 Nocturia: Secondary | ICD-10-CM | POA: Diagnosis not present

## 2016-07-18 ENCOUNTER — Other Ambulatory Visit: Payer: Self-pay | Admitting: Physician Assistant

## 2016-08-16 DIAGNOSIS — Z7952 Long term (current) use of systemic steroids: Secondary | ICD-10-CM | POA: Diagnosis not present

## 2016-08-16 DIAGNOSIS — M5416 Radiculopathy, lumbar region: Secondary | ICD-10-CM | POA: Diagnosis not present

## 2016-08-16 DIAGNOSIS — R202 Paresthesia of skin: Secondary | ICD-10-CM | POA: Diagnosis not present

## 2016-08-16 DIAGNOSIS — Z79899 Other long term (current) drug therapy: Secondary | ICD-10-CM | POA: Diagnosis not present

## 2016-08-16 DIAGNOSIS — A150 Tuberculosis of lung: Secondary | ICD-10-CM | POA: Diagnosis not present

## 2016-08-16 DIAGNOSIS — Z6828 Body mass index (BMI) 28.0-28.9, adult: Secondary | ICD-10-CM | POA: Diagnosis not present

## 2016-08-16 DIAGNOSIS — M0579 Rheumatoid arthritis with rheumatoid factor of multiple sites without organ or systems involvement: Secondary | ICD-10-CM | POA: Diagnosis not present

## 2016-08-16 DIAGNOSIS — C61 Malignant neoplasm of prostate: Secondary | ICD-10-CM | POA: Diagnosis not present

## 2016-08-16 DIAGNOSIS — R351 Nocturia: Secondary | ICD-10-CM | POA: Diagnosis not present

## 2016-08-17 DIAGNOSIS — H4089 Other specified glaucoma: Secondary | ICD-10-CM | POA: Diagnosis not present

## 2016-08-17 DIAGNOSIS — H40051 Ocular hypertension, right eye: Secondary | ICD-10-CM | POA: Diagnosis not present

## 2016-08-29 DIAGNOSIS — C61 Malignant neoplasm of prostate: Secondary | ICD-10-CM | POA: Diagnosis not present

## 2016-08-29 DIAGNOSIS — N401 Enlarged prostate with lower urinary tract symptoms: Secondary | ICD-10-CM | POA: Diagnosis not present

## 2016-08-29 DIAGNOSIS — R351 Nocturia: Secondary | ICD-10-CM | POA: Diagnosis not present

## 2016-10-07 ENCOUNTER — Other Ambulatory Visit: Payer: Self-pay | Admitting: Physician Assistant

## 2016-10-20 ENCOUNTER — Other Ambulatory Visit: Payer: Self-pay | Admitting: Physician Assistant

## 2016-10-20 DIAGNOSIS — I10 Essential (primary) hypertension: Secondary | ICD-10-CM

## 2016-11-04 DIAGNOSIS — C61 Malignant neoplasm of prostate: Secondary | ICD-10-CM | POA: Diagnosis not present

## 2016-11-11 DIAGNOSIS — N401 Enlarged prostate with lower urinary tract symptoms: Secondary | ICD-10-CM | POA: Diagnosis not present

## 2016-11-11 DIAGNOSIS — C775 Secondary and unspecified malignant neoplasm of intrapelvic lymph nodes: Secondary | ICD-10-CM | POA: Diagnosis not present

## 2016-11-11 DIAGNOSIS — C61 Malignant neoplasm of prostate: Secondary | ICD-10-CM | POA: Diagnosis not present

## 2016-11-11 DIAGNOSIS — R351 Nocturia: Secondary | ICD-10-CM | POA: Diagnosis not present

## 2016-11-22 DIAGNOSIS — R351 Nocturia: Secondary | ICD-10-CM | POA: Diagnosis not present

## 2016-11-22 DIAGNOSIS — M0579 Rheumatoid arthritis with rheumatoid factor of multiple sites without organ or systems involvement: Secondary | ICD-10-CM | POA: Diagnosis not present

## 2016-11-22 DIAGNOSIS — Z6827 Body mass index (BMI) 27.0-27.9, adult: Secondary | ICD-10-CM | POA: Diagnosis not present

## 2016-11-22 DIAGNOSIS — Z7952 Long term (current) use of systemic steroids: Secondary | ICD-10-CM | POA: Diagnosis not present

## 2016-11-22 DIAGNOSIS — Z79899 Other long term (current) drug therapy: Secondary | ICD-10-CM | POA: Diagnosis not present

## 2016-11-22 DIAGNOSIS — M5416 Radiculopathy, lumbar region: Secondary | ICD-10-CM | POA: Diagnosis not present

## 2016-11-22 DIAGNOSIS — A150 Tuberculosis of lung: Secondary | ICD-10-CM | POA: Diagnosis not present

## 2016-11-22 DIAGNOSIS — C61 Malignant neoplasm of prostate: Secondary | ICD-10-CM | POA: Diagnosis not present

## 2016-11-22 DIAGNOSIS — R202 Paresthesia of skin: Secondary | ICD-10-CM | POA: Diagnosis not present

## 2016-12-21 DIAGNOSIS — H40051 Ocular hypertension, right eye: Secondary | ICD-10-CM | POA: Diagnosis not present

## 2016-12-21 DIAGNOSIS — H4089 Other specified glaucoma: Secondary | ICD-10-CM | POA: Diagnosis not present

## 2017-02-21 ENCOUNTER — Other Ambulatory Visit: Payer: Self-pay | Admitting: Physician Assistant

## 2017-02-21 DIAGNOSIS — E663 Overweight: Secondary | ICD-10-CM | POA: Diagnosis not present

## 2017-02-21 DIAGNOSIS — E785 Hyperlipidemia, unspecified: Secondary | ICD-10-CM

## 2017-02-21 DIAGNOSIS — A150 Tuberculosis of lung: Secondary | ICD-10-CM | POA: Diagnosis not present

## 2017-02-21 DIAGNOSIS — Z7952 Long term (current) use of systemic steroids: Secondary | ICD-10-CM | POA: Diagnosis not present

## 2017-02-21 DIAGNOSIS — C61 Malignant neoplasm of prostate: Secondary | ICD-10-CM | POA: Diagnosis not present

## 2017-02-21 DIAGNOSIS — R413 Other amnesia: Secondary | ICD-10-CM | POA: Diagnosis not present

## 2017-02-21 DIAGNOSIS — M7062 Trochanteric bursitis, left hip: Secondary | ICD-10-CM | POA: Diagnosis not present

## 2017-02-21 DIAGNOSIS — M0579 Rheumatoid arthritis with rheumatoid factor of multiple sites without organ or systems involvement: Secondary | ICD-10-CM | POA: Diagnosis not present

## 2017-02-21 DIAGNOSIS — M5416 Radiculopathy, lumbar region: Secondary | ICD-10-CM | POA: Diagnosis not present

## 2017-02-21 DIAGNOSIS — Z79899 Other long term (current) drug therapy: Secondary | ICD-10-CM | POA: Diagnosis not present

## 2017-02-24 ENCOUNTER — Other Ambulatory Visit: Payer: Self-pay | Admitting: Physician Assistant

## 2017-02-24 DIAGNOSIS — E785 Hyperlipidemia, unspecified: Secondary | ICD-10-CM

## 2017-03-13 ENCOUNTER — Encounter: Payer: Self-pay | Admitting: Physician Assistant

## 2017-03-13 ENCOUNTER — Other Ambulatory Visit: Payer: Self-pay

## 2017-03-13 ENCOUNTER — Ambulatory Visit: Payer: Medicare HMO | Admitting: Physician Assistant

## 2017-03-13 VITALS — BP 108/62 | HR 70 | Temp 98.2°F | Resp 14 | Ht 68.0 in | Wt 173.0 lb

## 2017-03-13 DIAGNOSIS — M05742 Rheumatoid arthritis with rheumatoid factor of left hand without organ or systems involvement: Secondary | ICD-10-CM | POA: Diagnosis not present

## 2017-03-13 DIAGNOSIS — F32A Depression, unspecified: Secondary | ICD-10-CM

## 2017-03-13 DIAGNOSIS — I10 Essential (primary) hypertension: Secondary | ICD-10-CM

## 2017-03-13 DIAGNOSIS — F329 Major depressive disorder, single episode, unspecified: Secondary | ICD-10-CM | POA: Diagnosis not present

## 2017-03-13 DIAGNOSIS — M05741 Rheumatoid arthritis with rheumatoid factor of right hand without organ or systems involvement: Secondary | ICD-10-CM

## 2017-03-13 DIAGNOSIS — E785 Hyperlipidemia, unspecified: Secondary | ICD-10-CM

## 2017-03-13 DIAGNOSIS — C61 Malignant neoplasm of prostate: Secondary | ICD-10-CM

## 2017-03-13 LAB — LIPID PANEL
Cholesterol: 230 mg/dL — ABNORMAL HIGH (ref 0–200)
HDL: 42.7 mg/dL (ref >39.00)
NonHDL: 187.29
Total CHOL/HDL Ratio: 5
Triglycerides: 237 mg/dL — ABNORMAL HIGH (ref 0.0–149.0)
VLDL: 47.4 mg/dL — ABNORMAL HIGH (ref 0.0–40.0)

## 2017-03-13 LAB — COMPREHENSIVE METABOLIC PANEL
ALBUMIN: 4.4 g/dL (ref 3.5–5.2)
ALK PHOS: 56 U/L (ref 39–117)
ALT: 14 U/L (ref 0–53)
AST: 13 U/L (ref 0–37)
BILIRUBIN TOTAL: 0.5 mg/dL (ref 0.2–1.2)
BUN: 23 mg/dL (ref 6–23)
CO2: 31 mEq/L (ref 19–32)
Calcium: 10 mg/dL (ref 8.4–10.5)
Chloride: 94 mEq/L — ABNORMAL LOW (ref 96–112)
Creatinine, Ser: 1.29 mg/dL (ref 0.40–1.50)
GFR: 58.12 mL/min — AB (ref >60.00)
Glucose, Bld: 85 mg/dL (ref 70–99)
Potassium: 3.8 mEq/L (ref 3.5–5.1)
Sodium: 134 mEq/L — ABNORMAL LOW (ref 135–145)
TOTAL PROTEIN: 7 g/dL (ref 6.0–8.3)

## 2017-03-13 LAB — LDL CHOLESTEROL, DIRECT: Direct LDL: 151 mg/dL

## 2017-03-13 MED ORDER — HYDROCODONE-ACETAMINOPHEN 10-325 MG PO TABS: 1.0000 | ORAL_TABLET | Freq: Three times a day (TID) | ORAL | 0 refills | Status: DC | PRN

## 2017-03-13 NOTE — Patient Instructions (Signed)
Please go to the lab today for blood work.  I will call you with your results. We will alter treatment regimen(s) if indicated by your results.   Please let us set you up for a bursa injection.  This would be very beneficial for the pain. I am giving a one-time script for pain medicine to help until you can have injection.

## 2017-03-13 NOTE — Assessment & Plan Note (Signed)
Followed by Rheumatology. Currently on prednisone and once weekly MTX. CMP today.

## 2017-03-13 NOTE — Progress Notes (Signed)
Pre visit review using our clinic review tool, if applicable. No additional management support is needed unless otherwise documented below in the visit note. 

## 2017-03-13 NOTE — Assessment & Plan Note (Signed)
Doing very well. Continue current regimen.  

## 2017-03-13 NOTE — Assessment & Plan Note (Signed)
Followed by Urology. No new complaints. Continue care as directed by specialist.

## 2017-03-13 NOTE — Assessment & Plan Note (Signed)
BP normotensive. Asymptomatic. Continue current regimen. Will check CMP today. 
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
## 2017-03-13 NOTE — Assessment & Plan Note (Signed)
Taking statin and ASA as directed. Dietary and exercise recommendations reviewed.  Fasting lipids and CMP today.

## 2017-03-14 ENCOUNTER — Other Ambulatory Visit: Payer: Self-pay | Admitting: Emergency Medicine

## 2017-03-14 DIAGNOSIS — E782 Mixed hyperlipidemia: Secondary | ICD-10-CM

## 2017-03-14 MED ORDER — ATORVASTATIN CALCIUM 20 MG PO TABS: 20.0000 mg | ORAL_TABLET | Freq: Every day | ORAL | 1 refills | Status: DC

## 2017-03-24 DIAGNOSIS — C61 Malignant neoplasm of prostate: Secondary | ICD-10-CM | POA: Diagnosis not present

## 2017-03-29 DIAGNOSIS — C775 Secondary and unspecified malignant neoplasm of intrapelvic lymph nodes: Secondary | ICD-10-CM | POA: Diagnosis not present

## 2017-03-29 DIAGNOSIS — C61 Malignant neoplasm of prostate: Secondary | ICD-10-CM | POA: Diagnosis not present
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
## 2017-04-10 ENCOUNTER — Ambulatory Visit (INDEPENDENT_AMBULATORY_CARE_PROVIDER_SITE_OTHER): Payer: Medicare HMO | Admitting: Physician Assistant

## 2017-04-10 ENCOUNTER — Other Ambulatory Visit: Payer: Self-pay

## 2017-04-10 ENCOUNTER — Encounter: Payer: Self-pay | Admitting: Physician Assistant

## 2017-04-10 VITALS — BP 108/68 | HR 79 | Temp 98.3°F | Resp 14 | Ht 67.5 in | Wt 173.0 lb

## 2017-04-10 DIAGNOSIS — Z0000 Encounter for general adult medical examination without abnormal findings: Secondary | ICD-10-CM

## 2017-04-10 DIAGNOSIS — I10 Essential (primary) hypertension: Secondary | ICD-10-CM | POA: Diagnosis not present

## 2017-04-10 DIAGNOSIS — E785 Hyperlipidemia, unspecified: Secondary | ICD-10-CM | POA: Diagnosis not present

## 2017-04-10 DIAGNOSIS — Z23 Encounter for immunization: Secondary | ICD-10-CM

## 2017-04-10 DIAGNOSIS — C61 Malignant neoplasm of prostate: Secondary | ICD-10-CM | POA: Diagnosis not present

## 2017-04-10 LAB — LIPID PANEL
CHOL/HDL RATIO: 3
Cholesterol: 145 mg/dL (ref 0–200)
HDL: 44.8 mg/dL (ref >39.00)
NONHDL: 100.43
TRIGLYCERIDES: 204 mg/dL — AB (ref 0.0–149.0)
VLDL: 40.8 mg/dL — AB (ref 0.0–40.0)

## 2017-04-10 LAB — CBC WITH DIFFERENTIAL/PLATELET
BASOS PCT: 0.9 % (ref 0.0–3.0)
Basophils Absolute: 0.1 10*3/uL (ref 0.0–0.1)
EOS ABS: 0.4 10*3/uL (ref 0.0–0.7)
Eosinophils Relative: 3.4 % (ref 0.0–5.0)
HEMATOCRIT: 37.1 % — AB (ref 39.0–52.0)
Hemoglobin: 12.2 g/dL — ABNORMAL LOW (ref 13.0–17.0)
LYMPHS PCT: 17.9 % (ref 12.0–46.0)
Lymphs Abs: 2.4 10*3/uL (ref 0.7–4.0)
MCHC: 32.9 g/dL (ref 30.0–36.0)
MCV: 95.8 fl (ref 78.0–100.0)
MONO ABS: 0.9 10*3/uL (ref 0.1–1.0)
Monocytes Relative: 6.5 % (ref 3.0–12.0)
Neutro Abs: 9.4 10*3/uL — ABNORMAL HIGH (ref 1.4–7.7)
Neutrophils Relative %: 71.3 % (ref 43.0–77.0)
PLATELETS: 440 10*3/uL — AB (ref 150.0–400.0)
RBC: 3.87 Mil/uL — ABNORMAL LOW (ref 4.22–5.81)
RDW: 15.2 % (ref 11.5–15.5)
WBC: 13.1 10*3/uL — ABNORMAL HIGH (ref 4.0–10.5)

## 2017-04-10 LAB — HEMOGLOBIN A1C: Hgb A1c MFr Bld: 5.8 % (ref 4.6–6.5)

## 2017-04-10 LAB — COMPREHENSIVE METABOLIC PANEL
ALT: 13 U/L (ref 0–53)
AST: 10 U/L (ref 0–37)
Albumin: 4.4 g/dL (ref 3.5–5.2)
Alkaline Phosphatase: 53 U/L (ref 39–117)
BUN: 23 mg/dL (ref 6–23)
CALCIUM: 9.7 mg/dL (ref 8.4–10.5)
CO2: 30 meq/L (ref 19–32)
CREATININE: 1.27 mg/dL (ref 0.40–1.50)
Chloride: 97 mEq/L (ref 96–112)
GFR: 59.17 mL/min — ABNORMAL LOW (ref >60.00)
Glucose, Bld: 94 mg/dL (ref 70–99)
POTASSIUM: 3.8 meq/L (ref 3.5–5.1)
Sodium: 136 mEq/L (ref 135–145)
Total Bilirubin: 0.5 mg/dL (ref 0.2–1.2)
Total Protein: 7.2 g/dL (ref 6.0–8.3)

## 2017-04-10 LAB — LDL CHOLESTEROL, DIRECT: Direct LDL: 86 mg/dL

## 2017-04-10 NOTE — Patient Instructions (Signed)
Please go to the lab for blood work.   Our office will call you with your results unless you have chosen to receive results via MyChart.  If your blood work is normal we will follow-up each year for physicals and as scheduled for chronic medical problems.  If anything is abnormal we will treat accordingly and get you in for a follow-up.   Preventive Care 72 Years and Older, Male Preventive care refers to lifestyle choices and visits with your health care provider that can promote health and wellness. What does preventive care include?  A yearly physical exam. This is also called an annual well check.  Dental exams once or twice a year.  Routine eye exams. Ask your health care provider how often you should have your eyes checked.  Personal lifestyle choices, including: ? Daily care of your teeth and gums. ? Regular physical activity. ? Eating a healthy diet. ? Avoiding tobacco and drug use. ? Limiting alcohol use. ? Practicing safe sex. ? Taking low doses of aspirin every day. ? Taking vitamin and mineral supplements as recommended by your health care provider. What happens during an annual well check? The services and screenings done by your health care provider during your annual well check will depend on your age, overall health, lifestyle risk factors, and family history of disease. Counseling Your health care provider may ask you questions about your:  Alcohol use.  Tobacco use.  Drug use.  Emotional well-being.  Home and relationship well-being.  Sexual activity.  Eating habits.  History of falls.  Memory and ability to understand (cognition).  Work and work Statistician.  Screening You may have the following tests or measurements:  Height, weight, and BMI.  Blood pressure.  Lipid and cholesterol levels. These may be checked every 5 years, or more frequently if you are over 13 years old.  Skin check.  Lung cancer screening. You may have this  screening every year starting at age 32 if you have a 30-pack-year history of smoking and currently smoke or have quit within the past 15 years.  Fecal occult blood test (FOBT) of the stool. You may have this test every year starting at age 50.  Flexible sigmoidoscopy or colonoscopy. You may have a sigmoidoscopy every 5 years or a colonoscopy every 10 years starting at age 57.  Prostate cancer screening. Recommendations will vary depending on your family history and other risks.  Hepatitis C blood test.  Hepatitis B blood test.  Sexually transmitted disease (STD) testing.  Diabetes screening. This is done by checking your blood sugar (glucose) after you have not eaten for a while (fasting). You may have this done every 1-3 years.  Abdominal aortic aneurysm (AAA) screening. You may need this if you are a current or former smoker.  Osteoporosis. You may be screened starting at age 71 if you are at high risk.  Talk with your health care provider about your test results, treatment options, and if necessary, the need for more tests. Vaccines Your health care provider may recommend certain vaccines, such as:  Influenza vaccine. This is recommended every year.  Tetanus, diphtheria, and acellular pertussis (Tdap, Td) vaccine. You may need a Td booster every 10 years.  Varicella vaccine. You may need this if you have not been vaccinated.  Zoster vaccine. You may need this after age 43.  Measles, mumps, and rubella (MMR) vaccine. You may need at least one dose of MMR if you were born in 1957 or later. You  may also need a second dose.  Pneumococcal 13-valent conjugate (PCV13) vaccine. One dose is recommended after age 50.  Pneumococcal polysaccharide (PPSV23) vaccine. One dose is recommended after age 67.  Meningococcal vaccine. You may need this if you have certain conditions.  Hepatitis A vaccine. You may need this if you have certain conditions or if you travel or work in places where  you may be exposed to hepatitis A.  Hepatitis B vaccine. You may need this if you have certain conditions or if you travel or work in places where you may be exposed to hepatitis B.  Haemophilus influenzae type b (Hib) vaccine. You may need this if you have certain risk factors.  Talk to your health care provider about which screenings and vaccines you need and how often you need them. This information is not intended to replace advice given to you by your health care provider. Make sure you discuss any questions you have with your health care provider. Document Released: 05/08/2015 Document Revised: 12/30/2015 Document Reviewed: 02/10/2015 Elsevier Interactive Patient Education  2017 [employer]. .

## 2017-04-11 ENCOUNTER — Encounter: Payer: Medicare HMO | Admitting: Physician Assistant

## 2017-04-12 ENCOUNTER — Other Ambulatory Visit: Payer: Self-pay | Admitting: Physician Assistant

## 2017-04-12 ENCOUNTER — Encounter: Payer: Self-pay | Admitting: General Practice

## 2017-04-14 DIAGNOSIS — C61 Malignant neoplasm of prostate: Secondary | ICD-10-CM | POA: Diagnosis not present

## 2017-04-14 DIAGNOSIS — Z5111 Encounter for antineoplastic chemotherapy: Secondary | ICD-10-CM | POA: Diagnosis not present

## 2017-05-02 ENCOUNTER — Telehealth: Payer: Self-pay | Admitting: Physician Assistant

## 2017-05-02 NOTE — Telephone Encounter (Signed)
Received update on last colonoscopy via a letter from patient's sister that comes to each appointment with him. Seems he had prior abnormal colonoscopy 5 years ago and did not follow-up. Is due for repeat colonoscopy. Please see if he is willing to let us place.  I would also ask him to have her added to his DPR if he would like so that we can discuss medical care with her. It seems that they rely on her to help keep up with things discussed at appointment but do not have her on DPR.

## 2017-05-03 NOTE — Telephone Encounter (Signed)
Patient was here in the office with family who had an appointment, so I discussed the notes below with him.    Patient states that he will think about the colonoscopy, that he does not want to agree to one just yet.    I stressed the need for this as his last one was abnormal - he stated understanding and said he would let us know.  Also discussed DPR with patient.  At this time he does not want [place] to speak to anyone regarding his medical care except for him.    He states that if this changes he will update a DPR the next time he has an appointment, but for now he does not want [place] to share his information with anyone.   Routed back to provider just so he is aware.

## 2017-05-16 DIAGNOSIS — A150 Tuberculosis of lung: Secondary | ICD-10-CM | POA: Diagnosis not present

## 2017-05-16 DIAGNOSIS — C61 Malignant neoplasm of prostate: Secondary | ICD-10-CM | POA: Diagnosis not present

## 2017-05-16 DIAGNOSIS — Z7952 Long term (current) use of systemic steroids: Secondary | ICD-10-CM | POA: Diagnosis not present

## 2017-05-16 DIAGNOSIS — M7062 Trochanteric bursitis, left hip: Secondary | ICD-10-CM | POA: Diagnosis not present

## 2017-05-16 DIAGNOSIS — Z79899 Other long term (current) drug therapy: Secondary | ICD-10-CM | POA: Diagnosis not present

## 2017-05-16 DIAGNOSIS — R413 Other amnesia: Secondary | ICD-10-CM | POA: Diagnosis not present

## 2017-05-16 DIAGNOSIS — M5416 Radiculopathy, lumbar region: Secondary | ICD-10-CM | POA: Diagnosis not present

## 2017-05-16 DIAGNOSIS — Z6827 Body mass index (BMI) 27.0-27.9, adult: Secondary | ICD-10-CM | POA: Diagnosis not present

## 2017-05-16 DIAGNOSIS — M0579 Rheumatoid arthritis with rheumatoid factor of multiple sites without organ or systems involvement: Secondary | ICD-10-CM | POA: Diagnosis not present

## 2017-06-21 ENCOUNTER — Other Ambulatory Visit: Payer: Self-pay | Admitting: Physician Assistant

## 2017-06-21 DIAGNOSIS — I10 Essential (primary) hypertension: Secondary | ICD-10-CM

## 2017-06-21 DIAGNOSIS — H2522 Age-related cataract, morgagnian type, left eye: Secondary | ICD-10-CM | POA: Diagnosis not present

## 2017-06-21 DIAGNOSIS — H40051 Ocular hypertension, right eye: Secondary | ICD-10-CM | POA: Diagnosis not present

## 2017-06-21 DIAGNOSIS — H4089 Other specified glaucoma: Secondary | ICD-10-CM | POA: Diagnosis not present

## 2017-06-21 DIAGNOSIS — H2511 Age-related nuclear cataract, right eye: Secondary | ICD-10-CM | POA: Diagnosis not present

## 2017-07-26 DIAGNOSIS — C61 Malignant neoplasm of prostate: Secondary | ICD-10-CM | POA: Diagnosis not present

## 2017-08-02 DIAGNOSIS — C61 Malignant neoplasm of prostate: Secondary | ICD-10-CM | POA: Diagnosis not present

## 2017-08-22 DIAGNOSIS — M7062 Trochanteric bursitis, left hip: Secondary | ICD-10-CM | POA: Diagnosis not present

## 2017-08-22 DIAGNOSIS — R413 Other amnesia: Secondary | ICD-10-CM | POA: Diagnosis not present

## 2017-08-22 DIAGNOSIS — M5416 Radiculopathy, lumbar region: Secondary | ICD-10-CM | POA: Diagnosis not present

## 2017-08-22 DIAGNOSIS — Z7952 Long term (current) use of systemic steroids: Secondary | ICD-10-CM | POA: Diagnosis not present

## 2017-08-22 DIAGNOSIS — Z6827 Body mass index (BMI) 27.0-27.9, adult: Secondary | ICD-10-CM | POA: Diagnosis not present

## 2017-08-22 DIAGNOSIS — Z79899 Other long term (current) drug therapy: Secondary | ICD-10-CM | POA: Diagnosis not present

## 2017-08-22 DIAGNOSIS — M0579 Rheumatoid arthritis with rheumatoid factor of multiple sites without organ or systems involvement: Secondary | ICD-10-CM | POA: Diagnosis not present

## 2017-08-22 DIAGNOSIS — C61 Malignant neoplasm of prostate: Secondary | ICD-10-CM | POA: Diagnosis not present

## 2017-08-22 DIAGNOSIS — A150 Tuberculosis of lung: Secondary | ICD-10-CM | POA: Diagnosis not present

## 2017-10-13 ENCOUNTER — Other Ambulatory Visit: Payer: Self-pay | Admitting: Physician Assistant

## 2017-10-31 ENCOUNTER — Other Ambulatory Visit: Payer: Self-pay | Admitting: Physician Assistant

## 2017-10-31 DIAGNOSIS — E782 Mixed hyperlipidemia: Secondary | ICD-10-CM

## 2017-11-03 ENCOUNTER — Other Ambulatory Visit: Payer: Self-pay | Admitting: Physician Assistant

## 2017-11-08 DIAGNOSIS — N401 Enlarged prostate with lower urinary tract symptoms: Secondary | ICD-10-CM | POA: Diagnosis not present

## 2017-11-08 DIAGNOSIS — C775 Secondary and unspecified malignant neoplasm of intrapelvic lymph nodes: Secondary | ICD-10-CM | POA: Diagnosis not present

## 2017-11-08 DIAGNOSIS — R351 Nocturia: Secondary | ICD-10-CM | POA: Diagnosis not present

## 2017-11-08 DIAGNOSIS — C61 Malignant neoplasm of prostate: Secondary | ICD-10-CM | POA: Diagnosis not present

## 2017-11-29 DIAGNOSIS — C61 Malignant neoplasm of prostate: Secondary | ICD-10-CM | POA: Diagnosis not present

## 2017-11-29 DIAGNOSIS — M5416 Radiculopathy, lumbar region: Secondary | ICD-10-CM | POA: Diagnosis not present

## 2017-11-29 DIAGNOSIS — E663 Overweight: Secondary | ICD-10-CM | POA: Diagnosis not present

## 2017-11-29 DIAGNOSIS — Z79899 Other long term (current) drug therapy: Secondary | ICD-10-CM | POA: Diagnosis not present

## 2017-11-29 DIAGNOSIS — R413 Other amnesia: Secondary | ICD-10-CM | POA: Diagnosis not present

## 2017-11-29 DIAGNOSIS — M0579 Rheumatoid arthritis with rheumatoid factor of multiple sites without organ or systems involvement: Secondary | ICD-10-CM | POA: Diagnosis not present

## 2017-11-29 DIAGNOSIS — M7062 Trochanteric bursitis, left hip: Secondary | ICD-10-CM | POA: Diagnosis not present

## 2017-11-29 DIAGNOSIS — Z7952 Long term (current) use of systemic steroids: Secondary | ICD-10-CM | POA: Diagnosis not present

## 2017-11-29 DIAGNOSIS — A150 Tuberculosis of lung: Secondary | ICD-10-CM | POA: Diagnosis not present

## 2017-11-30 ENCOUNTER — Other Ambulatory Visit: Payer: Self-pay | Admitting: Physician Assistant

## 2017-11-30 DIAGNOSIS — Z7952 Long term (current) use of systemic steroids: Secondary | ICD-10-CM

## 2017-12-27 DIAGNOSIS — M0579 Rheumatoid arthritis with rheumatoid factor of multiple sites without organ or systems involvement: Secondary | ICD-10-CM | POA: Diagnosis not present

## 2018-01-02 ENCOUNTER — Other Ambulatory Visit: Payer: Self-pay | Admitting: Physician Assistant

## 2018-01-02 DIAGNOSIS — I10 Essential (primary) hypertension: Secondary | ICD-10-CM

## 2018-01-02 MED ORDER — LISINOPRIL-HYDROCHLOROTHIAZIDE 10-12.5 MG PO TABS
1.0000 | ORAL_TABLET | Freq: Every day | ORAL | 0 refills | Status: DC
Start: 2018-01-02 — End: 2018-01-29

## 2018-01-02 NOTE — Telephone Encounter (Signed)
Copied from [place] [id number]. Topic: Quick Communication - Rx Refill/Question >> Jan 02, 2018 10:40 AM Reyne Dumas L wrote: Medication:  folic acid (FOLVITE) 1 MG tablet lisinopril-hydrochlorothiazide (PRINZIDE,ZESTORETIC) 10-12.5 MG tablet  Has the patient contacted their pharmacy? No - new pharmacy for pt (Agent: If no, request that the patient contact the pharmacy for the refill.) (Agent: If yes, when and what did the pharmacy advise?)  Preferred Pharmacy (with phone number or street name): [place], [place], [place] - [address] [id number] (Phone) [phone] (Fax)  Agent: Please be advised that RX refills may take up to 3 business days. We ask that you follow-up with your pharmacy.

## 2018-01-02 NOTE — Telephone Encounter (Signed)
Folic acid refill Last refill:11/21/14 by historical provider Last OV:04/10/17 UVQ:QUIVHO Pharmacy: [facility], [place], [place] - [address] [phone] (Phone) [phone] (Fax)

## 2018-01-02 NOTE — Telephone Encounter (Signed)
Pt's wife reports pt has no folic acid left and only one lisinopril left.

## 2018-01-15 ENCOUNTER — Other Ambulatory Visit: Payer: Self-pay

## 2018-01-15 ENCOUNTER — Telehealth: Payer: Self-pay | Admitting: Physician Assistant

## 2018-01-15 MED ORDER — ESCITALOPRAM OXALATE 20 MG PO TABS: 10.0000 mg | ORAL_TABLET | Freq: Every day | ORAL | 1 refills | Status: DC

## 2018-01-15 NOTE — Telephone Encounter (Unsigned)
Copied from [place] [id number]. Topic: Quick Communication - See Telephone Encounter >> Jan 15, 2018  2:34 PM Hewitt Shorts wrote:   [phone] linda -pt sister and is own [place]   Pt is needing a refill on generic lexopro

## 2018-01-16 ENCOUNTER — Other Ambulatory Visit: Payer: Self-pay | Admitting: Physician Assistant

## 2018-01-16 NOTE — Telephone Encounter (Signed)
Prednisone refill Last Refill: 03/03/2016 historical provider Last OV: 04/10/17 PCP: [place]: [facility]. In [facility]  Not filled as previous refill was through historical provider.

## 2018-01-16 NOTE — Telephone Encounter (Signed)
Copied from [place] [id number]. Topic: Quick Communication - Rx Refill/Question >> Jan 16, 2018  9:58 AM Oliver Pila B wrote: Medication: predniSONE (DELTASONE) 5 MG tablet [[id number]]   Has the patient contacted their pharmacy? Yes.   (Agent: If no, request that the patient contact the pharmacy for the refill.) (Agent: If yes, when and what did the pharmacy advise?)  Preferred Pharmacy (with phone number or street name): [facility]  Agent: Please be advised that RX refills may take up to 3 business days. We ask that you follow-up with your pharmacy.

## 2018-01-17 NOTE — Telephone Encounter (Signed)
LM with family to call.  Patient requested refill of prednisone.  This has not been filled since 2017.   If patient needs this medication he needs to make an appointment with [facility].  I called to discuss and he was not available. If he or his wife call back okay for [facility] to discuss/schedule appt.

## 2018-01-17 NOTE — Telephone Encounter (Signed)
Wife called back and states pt has an appt on 10/07 with [facility].  He can wait until then. Offered to move pt's appt to a sooner date and time, but she declined, stating this appt will be fine. Nothing further needed.

## 2018-01-24 DIAGNOSIS — M0579 Rheumatoid arthritis with rheumatoid factor of multiple sites without organ or systems involvement: Secondary | ICD-10-CM | POA: Diagnosis not present

## 2018-01-24 DIAGNOSIS — Z79899 Other long term (current) drug therapy: Secondary | ICD-10-CM | POA: Diagnosis not present

## 2018-01-29 ENCOUNTER — Ambulatory Visit (INDEPENDENT_AMBULATORY_CARE_PROVIDER_SITE_OTHER): Payer: Medicare HMO | Admitting: Physician Assistant

## 2018-01-29 ENCOUNTER — Ambulatory Visit
Admission: RE | Admit: 2018-01-29 | Discharge: 2018-01-29 | Disposition: A | Discharge status: Home / Self Care | Payer: Medicare HMO | Source: Ambulatory Visit | Attending: Physician Assistant | Admitting: Physician Assistant

## 2018-01-29 ENCOUNTER — Encounter: Payer: Self-pay | Admitting: Physician Assistant

## 2018-01-29 ENCOUNTER — Other Ambulatory Visit: Payer: Self-pay

## 2018-01-29 VITALS — BP 98/60 | HR 70 | Temp 98.3°F | Resp 14 | Ht 66.0 in | Wt 173.0 lb

## 2018-01-29 DIAGNOSIS — Z7952 Long term (current) use of systemic steroids: Secondary | ICD-10-CM

## 2018-01-29 DIAGNOSIS — E785 Hyperlipidemia, unspecified: Secondary | ICD-10-CM | POA: Diagnosis not present

## 2018-01-29 DIAGNOSIS — I10 Essential (primary) hypertension: Secondary | ICD-10-CM | POA: Diagnosis not present

## 2018-01-29 DIAGNOSIS — R413 Other amnesia: Secondary | ICD-10-CM

## 2018-01-29 DIAGNOSIS — Z1211 Encounter for screening for malignant neoplasm of colon: Secondary | ICD-10-CM

## 2018-01-29 DIAGNOSIS — M85851 Other specified disorders of bone density and structure, right thigh: Secondary | ICD-10-CM | POA: Diagnosis not present

## 2018-01-29 MED ORDER — LISINOPRIL 10 MG PO TABS: 10.0000 mg | ORAL_TABLET | Freq: Every day | ORAL | 1 refills | Status: DC
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
## 2018-01-29 NOTE — Patient Instructions (Addendum)
Please stop the combination lisinopril-hctz. Take the new prescription for the lisinopril only.  Continue the amlodipine.  Keep hydrated and keep a well-balanced diet.   Start the diet below to help with loose stools. This seems consistent with a viral stomach bug that is resolving. Immodium if needed for loose stools but this should calm down in the next 48-72 hours. If not, please call me.   I am checking some labs today to further assess the memory changes.  We will be setting you up with Neurology.  You will be contacted regarding the Cologuard test.   Please go to the lab today for blood work.  I will call you with your results. We will alter treatment regimen(s) if indicated by your results.    Food Choices to Help Relieve Diarrhea, Adult When you have diarrhea, the foods you eat and your eating habits are very important. Choosing the right foods and drinks can help:  Relieve diarrhea.  Replace lost fluids and nutrients.  Prevent dehydration.  What general guidelines should I follow? Relieving diarrhea  Choose foods with less than 2 g or .07 oz. of fiber per serving.  Limit fats to less than 8 tsp (38 g or 1.34 oz.) a day.  Avoid the following: ? Foods and beverages sweetened with high-fructose corn syrup, honey, or sugar alcohols such as xylitol, sorbitol, and mannitol. ? Foods that contain a lot of fat or sugar. ? Fried, greasy, or spicy foods. ? High-fiber grains, breads, and cereals. ? Raw fruits and vegetables.  Eat foods that are rich in probiotics. These foods include dairy products such as yogurt and fermented milk products. They help increase healthy bacteria in the stomach and intestines (gastrointestinal tract, or GI tract).  If you have lactose intolerance, avoid dairy products. These may make your diarrhea worse.  Take medicine to help stop diarrhea (antidiarrheal medicine) only as told by your health care provider. Replacing nutrients  Eat small meals  or snacks every 3-4 hours.  Eat bland foods, such as white rice, toast, or baked potato, until your diarrhea starts to get better. Gradually reintroduce nutrient-rich foods as tolerated or as told by your health care provider. This includes: ? Well-cooked protein foods. ? Peeled, seeded, and soft-cooked fruits and vegetables. ? Low-fat dairy products.  Take vitamin and mineral supplements as told by your health care provider. Preventing dehydration   Start by sipping water or a special solution to prevent dehydration (oral rehydration solution, ORS). Urine that is clear or pale yellow means that you are getting enough fluid.  Try to drink at least 8-10 cups of fluid each day to help replace lost fluids.  You may add other liquids in addition to water, such as clear juice or decaffeinated sports drinks, as tolerated or as told by your health care provider.  Avoid drinks with caffeine, such as coffee, tea, or soft drinks.  Avoid alcohol. What foods are recommended? The items listed may not be a complete list. Talk with your health care provider about what dietary choices are best for you. Grains White rice. White, [place], or pita breads (fresh or toasted), including plain rolls, buns, or bagels. White pasta. Saltine, soda, or graham crackers. Pretzels. Low-fiber cereal. Cooked cereals made with water (such as cornmeal, farina, or cream cereals). Plain muffins. Matzo. Melba toast. Zwieback. Vegetables Potatoes (without the skin). Most well-cooked and canned vegetables without skins or seeds. Tender lettuce. Fruits Apple sauce. Fruits canned in juice. Cooked apricots, cherries, grapefruit, peaches, pears,  or plums. Fresh bananas and cantaloupe. Meats and other protein foods Baked or boiled chicken. Eggs. Tofu. Fish. Seafood. Smooth nut butters. Ground or well-cooked tender beef, ham, veal, lamb, pork, or poultry. Dairy Plain yogurt, kefir, and unsweetened liquid yogurt. Lactose-free milk,  buttermilk, skim milk, or soy milk. Low-fat or nonfat hard cheese. Beverages Water. Low-calorie sports drinks. Fruit juices without pulp. Strained tomato and vegetable juices. Decaffeinated teas. Sugar-free beverages not sweetened with sugar alcohols. Oral rehydration solutions, if approved by your health care provider. Seasoning and other foods Bouillon, broth, or soups made from recommended foods. What foods are not recommended? The items listed may not be a complete list. Talk with your health care provider about what dietary choices are best for you. Grains Whole grain, whole wheat, bran, or rye breads, rolls, pastas, and crackers. Wild or brown rice. Whole grain or bran cereals. Barley. Oats and oatmeal. Corn tortillas or taco shells. Granola. Popcorn. Vegetables Raw vegetables. Fried vegetables. Cabbage, broccoli, Brussels sprouts, artichokes, baked beans, beet greens, corn, kale, legumes, peas, sweet potatoes, and yams. Potato skins. Cooked spinach and cabbage. Fruits Dried fruit, including raisins and dates. Raw fruits. Stewed or dried prunes. Canned fruits with syrup. Meat and other protein foods Fried or fatty meats. Deli meats. Chunky nut butters. Nuts and seeds. Beans and lentils. Joel Valdez. Hot dogs. Sausage. Dairy High-fat cheeses. Whole milk, chocolate milk, and beverages made with milk, such as milk shakes. Half-and-half. Cream. sour cream. Ice cream. Beverages Caffeinated beverages (such as coffee, tea, soda, or energy drinks). Alcoholic beverages. Fruit juices with pulp. Prune juice. Soft drinks sweetened with high-fructose corn syrup or sugar alcohols. High-calorie sports drinks. Fats and oils Butter. Cream sauces. Margarine. Salad oils. Plain salad dressings. Olives. Avocados. Mayonnaise. Sweets and desserts Sweet rolls, doughnuts, and sweet breads. Sugar-free desserts sweetened with sugar alcohols such as xylitol and sorbitol. Seasoning and other foods Honey. Hot sauce. Chili  powder. Gravy. Cream-based or milk-based soups. Pancakes and waffles. Summary  When you have diarrhea, the foods you eat and your eating habits are very important.  Make sure you get at least 8-10 cups of fluid each day, or enough to keep your urine clear or pale yellow.  Eat bland foods and gradually reintroduce healthy, nutrient-rich foods as tolerated, or as told by your health care provider.  Avoid high-fiber, fried, greasy, or spicy foods. This information is not intended to replace advice given to you by your health care provider. Make sure you discuss any questions you have with your health care provider. Document Released: 07/02/2003 Document Revised: 04/08/2016 Document Reviewed: 04/08/2016 Elsevier Interactive Patient Education  [employer].

## 2018-01-30 ENCOUNTER — Other Ambulatory Visit: Payer: Self-pay | Admitting: Physician Assistant

## 2018-01-30 LAB — COMPREHENSIVE METABOLIC PANEL
AG Ratio: 1.5 (calc) (ref 1.0–2.5)
ALBUMIN MSPROF: 3.8 g/dL (ref 3.6–5.1)
ALT: 23 U/L (ref 9–46)
AST: 16 U/L (ref 10–35)
Alkaline phosphatase (APISO): 57 U/L (ref 40–115)
BILIRUBIN TOTAL: 0.3 mg/dL (ref 0.2–1.2)
BUN/Creatinine Ratio: 14 (calc) (ref 6–22)
BUN: 21 mg/dL (ref 7–25)
CALCIUM: 9 mg/dL (ref 8.6–10.3)
CHLORIDE: 99 mmol/L (ref 98–110)
CO2: 24 mmol/L (ref 20–32)
Creat: 1.55 mg/dL — ABNORMAL HIGH (ref 0.70–1.18)
GLOBULIN: 2.6 g/dL (ref 1.9–3.7)
Glucose, Bld: 116 mg/dL — ABNORMAL HIGH (ref 65–99)
POTASSIUM: 4.1 mmol/L (ref 3.5–5.3)
SODIUM: 136 mmol/L (ref 135–146)
TOTAL PROTEIN: 6.4 g/dL (ref 6.1–8.1)

## 2018-01-30 LAB — LIPID PANEL
Cholesterol: 138 mg/dL (ref <200)
HDL: 31 mg/dL — ABNORMAL LOW (ref >40)
LDL CHOLESTEROL (CALC): 74 mg/dL
Non-HDL Cholesterol (Calc): 107 mg/dL (calc) (ref <130)
Total CHOL/HDL Ratio: 4.5 (calc) (ref <5.0)
Triglycerides: 254 mg/dL — ABNORMAL HIGH (ref <150)

## 2018-01-30 LAB — TSH: TSH: 1.09 mIU/L (ref 0.40–4.50)

## 2018-01-30 LAB — RPR: RPR Ser Ql: NONREACTIVE

## 2018-01-30 LAB — VITAMIN B12: VITAMIN B 12: 622 pg/mL (ref 200–1100)

## 2018-01-30 LAB — EXTRA LAV TOP TUBE

## 2018-01-30 NOTE — Telephone Encounter (Signed)
Copied from [place] [id number]. Topic: Quick Communication - Rx Refill/Question >> Jan 30, 2018 11:16 AM Keene Breath wrote: Medication: predniSONE (DELTASONE) 5 MG tablet  Patient called to request refill for the above medication.  CB# [id number]  Preferred Pharmacy (with phone number or street name): [facility], [place], [place] - [address] [phone] (Phone) [phone] (Fax)

## 2018-01-30 NOTE — Telephone Encounter (Signed)
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   Requested Prescriptions  Pending Prescriptions Disp Refills   predniSONE (DELTASONE) 5 MG tablet      Sig: Take 1 tablet (5 mg total) by mouth daily.     Not Delegated - Endocrinology:  Oral Corticosteroids Failed - 01/30/2018 11:20 AM      Failed - This refill cannot be delegated      Passed - Last BP in normal range    BP Readings from Last 1 Encounters:  01/29/18 98/60         Passed - Valid encounter within last 6 months    Recent Outpatient Visits          Yesterday Memory loss   [facility] [place], [place]   9 months ago Visit for preventive health examination   [facility] [place], [place]   10 months ago Hyperlipidemia, unspecified hyperlipidemia type   [facility] [place] [facility], [place]   1 year ago Hyperlipidemia, unspecified hyperlipidemia type   [facility] [place], Luanna Cole, [place]   2 years ago Visit for preventive health examination   [facility], [place]      Future Appointments            In 2 weeks [name], PA-C [facility] [place], [place]

## 2018-01-31 ENCOUNTER — Other Ambulatory Visit: Payer: Self-pay | Admitting: Physician Assistant

## 2018-01-31 DIAGNOSIS — R7989 Other specified abnormal findings of blood chemistry: Secondary | ICD-10-CM

## 2018-01-31 DIAGNOSIS — R413 Other amnesia: Secondary | ICD-10-CM

## 2018-02-05 ENCOUNTER — Other Ambulatory Visit (INDEPENDENT_AMBULATORY_CARE_PROVIDER_SITE_OTHER): Payer: Medicare HMO

## 2018-02-05 ENCOUNTER — Telehealth: Payer: Self-pay | Admitting: Physician Assistant

## 2018-02-05 DIAGNOSIS — R7989 Other specified abnormal findings of blood chemistry: Secondary | ICD-10-CM | POA: Diagnosis not present

## 2018-02-05 LAB — BASIC METABOLIC PANEL
BUN: 17 mg/dL (ref 6–23)
CO2: 29 mEq/L (ref 19–32)
CREATININE: 1.2 mg/dL (ref 0.40–1.50)
Calcium: 9.3 mg/dL (ref 8.4–10.5)
Chloride: 103 mEq/L (ref 96–112)
GFR: 63.02 mL/min (ref >60.00)
Glucose, Bld: 154 mg/dL — ABNORMAL HIGH (ref 70–99)
POTASSIUM: 3.7 meq/L (ref 3.5–5.1)
Sodium: 139 mEq/L (ref 135–145)

## 2018-02-05 NOTE — Telephone Encounter (Signed)
Copied from [place] [id number]. Topic: General - Other >> Feb 05, 2018 12:08 PM Lennox Solders wrote:  Reason for CRM: pt sister rosselle who is on DPR is calling and would like to know the reason for her brother having more blood work. Pt had lab drawn today. Rosselle would like to know if the previous blood work was abnormal

## 2018-02-05 NOTE — Telephone Encounter (Signed)
Spoke with patient's sister regarding previous labs as to why we redrew labs today. She was advised that his creatinine level was mildly elevated, so therefore we rechecked a BMP.  She stated verbal understanding and appreciated my call back.

## 2018-02-14 DIAGNOSIS — Z79899 Other long term (current) drug therapy: Secondary | ICD-10-CM | POA: Diagnosis not present

## 2018-02-14 DIAGNOSIS — C775 Secondary and unspecified malignant neoplasm of intrapelvic lymph nodes: Secondary | ICD-10-CM | POA: Diagnosis not present

## 2018-02-14 DIAGNOSIS — C61 Malignant neoplasm of prostate: Secondary | ICD-10-CM | POA: Diagnosis not present

## 2018-02-15 ENCOUNTER — Other Ambulatory Visit: Payer: Self-pay | Admitting: Urology

## 2018-02-15 DIAGNOSIS — C61 Malignant neoplasm of prostate: Secondary | ICD-10-CM

## 2018-02-19 ENCOUNTER — Encounter: Payer: Self-pay | Admitting: Physician Assistant

## 2018-02-19 ENCOUNTER — Ambulatory Visit (INDEPENDENT_AMBULATORY_CARE_PROVIDER_SITE_OTHER): Payer: Medicare HMO | Admitting: Physician Assistant

## 2018-02-19 ENCOUNTER — Other Ambulatory Visit: Payer: Self-pay

## 2018-02-19 VITALS — BP 100/60 | HR 80 | Temp 97.9°F | Resp 14 | Ht 66.0 in | Wt 173.0 lb

## 2018-02-19 DIAGNOSIS — B029 Zoster without complications: Secondary | ICD-10-CM | POA: Diagnosis not present

## 2018-02-19 DIAGNOSIS — I10 Essential (primary) hypertension: Secondary | ICD-10-CM | POA: Diagnosis not present

## 2018-02-19 LAB — CBC WITH DIFFERENTIAL/PLATELET
BASOS ABS: 0.1 10*3/uL (ref 0.0–0.1)
Basophils Relative: 0.9 % (ref 0.0–3.0)
Eosinophils Absolute: 0.5 10*3/uL (ref 0.0–0.7)
Eosinophils Relative: 3.6 % (ref 0.0–5.0)
HCT: 35.2 % — ABNORMAL LOW (ref 39.0–52.0)
Hemoglobin: 11.9 g/dL — ABNORMAL LOW (ref 13.0–17.0)
Lymphocytes Relative: 18.3 % (ref 12.0–46.0)
Lymphs Abs: 2.7 10*3/uL (ref 0.7–4.0)
MCHC: 33.8 g/dL (ref 30.0–36.0)
MCV: 94.9 fl (ref 78.0–100.0)
MONOS PCT: 5.7 % (ref 3.0–12.0)
Monocytes Absolute: 0.8 10*3/uL (ref 0.1–1.0)
NEUTROS ABS: 10.4 10*3/uL — AB (ref 1.4–7.7)
NEUTROS PCT: 71.5 % (ref 43.0–77.0)
PLATELETS: 411 10*3/uL — AB (ref 150.0–400.0)
RBC: 3.71 Mil/uL — ABNORMAL LOW (ref 4.22–5.81)
RDW: 15.8 % — AB (ref 11.5–15.5)
WBC: 14.5 10*3/uL — ABNORMAL HIGH (ref 4.0–10.5)

## 2018-02-19 MED ORDER — VALACYCLOVIR HCL 1 G PO TABS: 1000.0000 mg | ORAL_TABLET | Freq: Three times a day (TID) | ORAL | 0 refills | Status: DC

## 2018-02-19 NOTE — Patient Instructions (Addendum)
Please go to the lab today for blood work.  I will call you with your results. We will alter treatment regimen(s) if indicated by your results.   Check BP once every other day -- sometimes in the morning after medication (at least 1 hr after taking), sometimes in the afternoon. I will be calling middle of next week to review these with you.  We will make further changes at that time.   Start the Valtrex taking as directed. Keep the area covered.  Tylenol or ibuprofen for mild to moderate pain. If pain worsens, let me know.    Shingles Shingles is an infection that causes a painful skin rash and fluid-filled blisters. Shingles is caused by the same virus that causes chickenpox. Shingles only develops in people who:  Have had chickenpox.  Have gotten the chickenpox vaccine. (This is rare.)  The first symptoms of shingles may be itching, tingling, or pain in an area on your skin. A rash will follow in a few days or weeks. The rash is usually on one side of the body in a bandlike or beltlike pattern. Over time, the rash turns into fluid-filled blisters that break open, scab over, and dry up. Medicines may:  Help you manage pain.  Help you recover more quickly.  Help to prevent long-term problems.  Follow these instructions at home: Medicines  Take medicines only as told by your doctor.  Apply an anti-itch or numbing cream to the affected area as told by your doctor. Blister and Rash Care  Take a cool bath or put cool compresses on the area of the rash or blisters as told by your doctor. This may help with pain and itching.  Keep your rash covered with a loose bandage (dressing). Wear loose-fitting clothing.  Keep your rash and blisters clean with mild soap and cool water or as told by your doctor.  Check your rash every day for signs of infection. These include redness, swelling, and pain that lasts or gets worse.  Do not pick your blisters.  Do not scratch your  rash. General instructions  Rest as told by your doctor.  Keep all follow-up visits as told by your doctor. This is important.  Until your blisters scab over, your infection can cause chickenpox in people who have never had it or been vaccinated against it. To prevent this from happening, avoid touching other people or being around other people, especially: ? Babies. ? Pregnant women. ? Children who have eczema. ? Elderly people who have transplants. ? People who have chronic illnesses, such as leukemia or AIDS. Contact a doctor if:  Your pain does not get better with medicine.  Your pain does not get better after the rash heals.  Your rash looks infected. Signs of infection include: ? Redness. ? Swelling. ? Pain that lasts or gets worse. Get help right away if:  The rash is on your face or nose.  You have pain in your face, pain around your eye area, or loss of feeling on one side of your face.  You have ear pain or you have ringing in your ear.  You have loss of taste.  Your condition gets worse. This information is not intended to replace advice given to you by your health care provider. Make sure you discuss any questions you have with your health care provider. Document Released: 09/28/2007 Document Revised: 12/06/2015 Document Reviewed: 01/21/2014 Elsevier Interactive Patient Education  [employer].
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
## 2018-02-23 ENCOUNTER — Telehealth: Payer: Self-pay

## 2018-02-23 NOTE — Telephone Encounter (Signed)
Advised PCP that unable to add on additonal labs.  Patient has been contacted and scheduled for lab visit for redraw

## 2018-02-26 ENCOUNTER — Other Ambulatory Visit (INDEPENDENT_AMBULATORY_CARE_PROVIDER_SITE_OTHER): Payer: Medicare HMO

## 2018-02-26 DIAGNOSIS — D649 Anemia, unspecified: Secondary | ICD-10-CM | POA: Diagnosis not present

## 2018-02-26 LAB — IRON: Iron: 64 ug/dL (ref 42–165)

## 2018-02-26 LAB — B12 AND FOLATE PANEL: Vitamin B-12: 440 pg/mL (ref 211–911)

## 2018-02-28 ENCOUNTER — Encounter (HOSPITAL_COMMUNITY)
Admission: RE | Admit: 2018-02-28 | Discharge: 2018-02-28 | Disposition: A | Discharge status: Home / Self Care | Payer: Medicare HMO | Source: Ambulatory Visit | Attending: Urology | Admitting: Urology

## 2018-02-28 ENCOUNTER — Encounter: Payer: Self-pay | Admitting: Urology

## 2018-02-28 DIAGNOSIS — K5730 Diverticulosis of large intestine without perforation or abscess without bleeding: Secondary | ICD-10-CM | POA: Diagnosis not present

## 2018-02-28 DIAGNOSIS — C61 Malignant neoplasm of prostate: Secondary | ICD-10-CM | POA: Diagnosis not present

## 2018-02-28 MED ORDER — TECHNETIUM TC 99M MEDRONATE IV KIT
20.2000 | PACK | Freq: Once | INTRAVENOUS | Status: AC | PRN
  Administered 2018-02-28: 20.2 via INTRAVENOUS

## 2018-03-01 DIAGNOSIS — C61 Malignant neoplasm of prostate: Secondary | ICD-10-CM | POA: Diagnosis not present

## 2018-03-01 DIAGNOSIS — Z79899 Other long term (current) drug therapy: Secondary | ICD-10-CM | POA: Diagnosis not present

## 2018-03-01 DIAGNOSIS — M5416 Radiculopathy, lumbar region: Secondary | ICD-10-CM | POA: Diagnosis not present

## 2018-03-01 DIAGNOSIS — N183 Chronic kidney disease, stage 3 (moderate): Secondary | ICD-10-CM | POA: Diagnosis not present

## 2018-03-01 DIAGNOSIS — Z7952 Long term (current) use of systemic steroids: Secondary | ICD-10-CM | POA: Diagnosis not present

## 2018-03-01 DIAGNOSIS — M0579 Rheumatoid arthritis with rheumatoid factor of multiple sites without organ or systems involvement: Secondary | ICD-10-CM | POA: Diagnosis not present

## 2018-03-01 DIAGNOSIS — M7062 Trochanteric bursitis, left hip: Secondary | ICD-10-CM | POA: Diagnosis not present

## 2018-03-01 DIAGNOSIS — R413 Other amnesia: Secondary | ICD-10-CM | POA: Diagnosis not present

## 2018-03-01 DIAGNOSIS — A150 Tuberculosis of lung: Secondary | ICD-10-CM | POA: Diagnosis not present

## 2018-03-06 ENCOUNTER — Other Ambulatory Visit: Payer: Self-pay | Admitting: Physician Assistant

## 2018-03-29 ENCOUNTER — Ambulatory Visit (INDEPENDENT_AMBULATORY_CARE_PROVIDER_SITE_OTHER): Payer: Medicare HMO | Admitting: Neurology

## 2018-03-29 ENCOUNTER — Encounter: Payer: Self-pay | Admitting: Neurology

## 2018-03-29 DIAGNOSIS — R413 Other amnesia: Secondary | ICD-10-CM | POA: Diagnosis not present

## 2018-03-30 DIAGNOSIS — R413 Other amnesia: Secondary | ICD-10-CM | POA: Insufficient documentation
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
## 2018-04-02 ENCOUNTER — Telehealth: Payer: Self-pay | Admitting: Neurology

## 2018-04-02 ENCOUNTER — Telehealth: Payer: Self-pay | Admitting: Physician Assistant

## 2018-04-02 NOTE — Telephone Encounter (Signed)
Received notification from [employer] that patient has not returned completed Cologuard kit. Will you call patient to assess these and encourage him to complete and return ASAP? Thank you.

## 2018-04-02 NOTE — Telephone Encounter (Signed)
Patient reminded to send in Cologuard Testing. He states that he will send in kit.   Doloris Hall,  LPN

## 2018-04-02 NOTE — Telephone Encounter (Signed)
[facility] Rossetti Josem Kaufmann: [id number] (exp. 04/02/18 to 05/02/18) order sent to GI. They will reach out to the pt to schedule.

## 2018-04-03 ENCOUNTER — Other Ambulatory Visit: Payer: Self-pay | Admitting: Physician Assistant

## 2018-04-23 ENCOUNTER — Other Ambulatory Visit: Payer: Self-pay | Admitting: Neurology

## 2018-04-23 ENCOUNTER — Other Ambulatory Visit: Payer: Self-pay | Admitting: Physician Assistant

## 2018-04-26 ENCOUNTER — Ambulatory Visit
Admission: RE | Admit: 2018-04-26 | Discharge: 2018-04-26 | Disposition: A | Discharge status: Home / Self Care | Payer: Medicare HMO | Source: Ambulatory Visit | Attending: Neurology | Admitting: Neurology

## 2018-04-26 DIAGNOSIS — R413 Other amnesia: Secondary | ICD-10-CM | POA: Diagnosis not present

## 2018-04-30 ENCOUNTER — Telehealth: Payer: Self-pay | Admitting: *Deleted

## 2018-04-30 NOTE — Telephone Encounter (Signed)
-----   Message from Penni Bombard, MD sent at 04/30/2018  2:18 PM EST ----- Atrophy and chronic small vessel ischemic disease noted. No acute or major findings. Please call patient. Continue current plan. -VRP

## 2018-04-30 NOTE — Telephone Encounter (Signed)
Message left requesting a return call

## 2018-04-30 NOTE — Telephone Encounter (Signed)
Spoke to patient's wife on [place].  She is aware of his MRI results.  States the patient's sister would like to come to his follow up and she is going to have her call back to schedule his next appointment.

## 2018-04-30 NOTE — Telephone Encounter (Signed)
LM with male who answered phone. Pt. not available. Wife (on [place]) will be home in 30 min. She will call for MRI results when she gets home/fim

## 2018-04-30 NOTE — Telephone Encounter (Signed)
Pts sister called to schedule pts appt, stating he is needing an afternoon. Scheduled for the 1st afternoon with Dr. Krista Blue 07/30/18 and added to a wait list.

## 2018-05-01 DIAGNOSIS — M0579 Rheumatoid arthritis with rheumatoid factor of multiple sites without organ or systems involvement: Secondary | ICD-10-CM | POA: Diagnosis not present

## 2018-05-09 DIAGNOSIS — Z1212 Encounter for screening for malignant neoplasm of rectum: Secondary | ICD-10-CM | POA: Diagnosis not present

## 2018-05-09 DIAGNOSIS — Z1211 Encounter for screening for malignant neoplasm of colon: Secondary | ICD-10-CM | POA: Diagnosis not present

## 2018-05-09 LAB — COLOGUARD

## 2018-05-10 NOTE — Telephone Encounter (Addendum)
I called and spoke to the patient's sister, Hilda Blades on [place],  to offer an earlier appt time.  She had a conflict with a vet appt at the time that was available.  Says her dog is sick (possible surgery) and she had to decline the appt.  She was very appreciative for the offer of being seen sooner.  We will call back if we see something else open.

## 2018-05-12 LAB — COLOGUARD: Cologuard: POSITIVE — AB

## 2018-05-14 ENCOUNTER — Other Ambulatory Visit: Payer: Self-pay

## 2018-05-14 DIAGNOSIS — Z1211 Encounter for screening for malignant neoplasm of colon: Secondary | ICD-10-CM

## 2018-05-14 DIAGNOSIS — R195 Other fecal abnormalities: Secondary | ICD-10-CM

## 2018-05-14 NOTE — Progress Notes (Signed)
Spoke with patients wife, [name].  Informed of positive Cologuard testing and GI referral placed.  Wife verbalized understanding.   Roderic Ovens, RN

## 2018-05-16 ENCOUNTER — Encounter: Payer: Self-pay | Admitting: Gastroenterology

## 2018-05-22 ENCOUNTER — Ambulatory Visit (AMBULATORY_SURGERY_CENTER): Payer: Self-pay | Admitting: *Deleted

## 2018-05-22 ENCOUNTER — Other Ambulatory Visit: Payer: Self-pay

## 2018-05-22 VITALS — Ht 66.0 in | Wt 166.0 lb

## 2018-05-22 DIAGNOSIS — R195 Other fecal abnormalities: Secondary | ICD-10-CM

## 2018-05-22 MED ORDER — PEG 3350-KCL-NA BICARB-NACL 420 G PO SOLR: 4000.0000 mL | Freq: Once | ORAL | 0 refills | Status: AC

## 2018-05-22 NOTE — Progress Notes (Signed)
No egg or soy allergy known to patient  No issues with past sedation with any surgeries  or procedures, no intubation problems  No diet pills per patient No home 02 use per patient  No blood thinners per patient  Pt denies issues with constipation  No A fib or A flutter  EMMI video offered and declined 

## 2018-05-23 ENCOUNTER — Encounter: Payer: Self-pay | Admitting: Gastroenterology

## 2018-05-23 NOTE — Telephone Encounter (Signed)
Called to offer earlier appt (05/24/2018) but patient's sister, Hilda Blades, is in the hospital and will not be able to make this appt.

## 2018-05-24 ENCOUNTER — Other Ambulatory Visit: Payer: Self-pay | Admitting: Physician Assistant

## 2018-05-24 DIAGNOSIS — E782 Mixed hyperlipidemia: Secondary | ICD-10-CM

## 2018-06-05 ENCOUNTER — Ambulatory Visit (AMBULATORY_SURGERY_CENTER): Payer: Medicare Other | Admitting: Gastroenterology

## 2018-06-05 ENCOUNTER — Encounter: Payer: Self-pay | Admitting: Gastroenterology

## 2018-06-05 VITALS — BP 138/78 | HR 79 | Temp 97.3°F | Resp 11 | Ht 66.0 in | Wt 169.0 lb

## 2018-06-05 DIAGNOSIS — D128 Benign neoplasm of rectum: Secondary | ICD-10-CM | POA: Diagnosis not present

## 2018-06-05 DIAGNOSIS — D120 Benign neoplasm of cecum: Secondary | ICD-10-CM

## 2018-06-05 DIAGNOSIS — R195 Other fecal abnormalities: Secondary | ICD-10-CM

## 2018-06-05 DIAGNOSIS — D122 Benign neoplasm of ascending colon: Secondary | ICD-10-CM

## 2018-06-05 DIAGNOSIS — Z1211 Encounter for screening for malignant neoplasm of colon: Secondary | ICD-10-CM | POA: Diagnosis not present

## 2018-06-05 DIAGNOSIS — Z86010 Personal history of colonic polyps: Secondary | ICD-10-CM | POA: Diagnosis not present

## 2018-06-05 DIAGNOSIS — K635 Polyp of colon: Secondary | ICD-10-CM

## 2018-06-05 MED ORDER — SODIUM CHLORIDE 0.9 % IV SOLN: 500.0000 mL | Freq: Once | INTRAVENOUS | Status: DC

## 2018-06-05 MED ORDER — METRONIDAZOLE 500 MG PO TABS: 500.0000 mg | ORAL_TABLET | Freq: Two times a day (BID) | ORAL | 0 refills | Status: DC

## 2018-06-05 MED ORDER — CIPROFLOXACIN HCL 500 MG PO TABS: 500.0000 mg | ORAL_TABLET | Freq: Two times a day (BID) | ORAL | 0 refills | Status: DC

## 2018-06-05 NOTE — Progress Notes (Signed)
Pt's states no medical or surgical changes since previsit or office visit. 

## 2018-06-05 NOTE — Op Note (Addendum)
[facility] Patient Name: Joel Valdez Procedure Date: 06/05/2018 10:01 AM MRN: [id number] Endoscopist: Justice Britain , MD Age: 73 Referring MD:  Date of Birth: [birth year] Gender: Male Account #: [id number] Procedure:                Colonoscopy Indications:              Surveillance: Personal history of colonic polyps                            (unknown histology) on last colonoscopy more than 5                            years ago Medicines:                Monitored Anesthesia Care Procedure:                Pre-Anesthesia Assessment:                           - Prior to the procedure, a History and Physical                            was performed, and patient medications and                            allergies were reviewed. The patient's tolerance of                            previous anesthesia was also reviewed. The risks                            and benefits of the procedure and the sedation                            options and risks were discussed with the patient.                            All questions were answered, and informed consent                            was obtained. Prior Anticoagulants: The patient has                            taken no previous anticoagulant or antiplatelet                            agents. ASA Grade Assessment: III - A patient with                            severe systemic disease. After reviewing the risks                            and benefits, the patient was deemed in  satisfactory condition to undergo the procedure.                           After obtaining informed consent, the colonoscope                            was passed under direct vision. Throughout the                            procedure, the patient's blood pressure, pulse, and                            oxygen saturations were monitored continuously. The                            Colonoscope was introduced through the anus  and                            advanced to the 5 cm into the ileum. The                            colonoscopy was performed without difficulty. The                            patient tolerated the procedure. The quality of the                            bowel preparation was excellent. The terminal                            ileum, ileocecal valve, appendiceal orifice, and                            rectum were photographed. Scope In: 10:08:06 AM Scope Out: 10:31:46 AM Scope Withdrawal Time: 0 hours 19 minutes 24 seconds  Total Procedure Duration: 0 hours 23 minutes 40 seconds  Findings:                 The digital rectal exam findings include                            non-thrombosed external hemorrhoids. Pertinent                            negatives include no palpable rectal lesions.                           The terminal ileum and ileocecal valve appeared                            normal.                           A 4 mm polypoid lesion was found at the appendiceal  orifice - not clear if this is adenomatous tissue.                            The lesion was sessile. No bleeding was present.                            The polypoid lesion was removed with a piecemeal                            technique using a cold biopsy forceps. Resection                            and retrieval were complete.                           A 2 mm polyp was found in the ascending colon. The                            polyp was sessile. The polyp was removed with a                            cold biopsy forceps. Resection and retrieval were                            complete.                           Three sessile polyps were found in the rectum. The                            polyps were 1 to 7 mm in size. These polyps were                            removed with a cold snare. Resection and retrieval                            were complete. The larger polyp after resection                             oozed for more than 5-minutes. For hemostasis, one                            hemostatic clip was successfully placed (MR                            conditional). There was no bleeding at the end of                            the procedure.                           Many small and large-mouthed diverticula were found  in the recto-sigmoid colon, sigmoid colon and                            descending colon.                           A few small-mouthed diverticula were found in the                            sigmoid colon. Erythema was seen in association                            with the diverticular opening. Not clear                            diverticulitis but very inflammed.                           Normal mucosa was found in the entire colon                            otherwise.                           Non-bleeding non-thrombosed external and internal                            hemorrhoids were found during retroflexion, during                            perianal exam and during digital exam. The                            hemorrhoids were Grade I (internal hemorrhoids that                            do not prolapse). Complications:            No immediate complications. Estimated Blood Loss:     Estimated blood loss was minimal. Impression:               - Non-thrombosed external hemorrhoids found on                            digital rectal exam.                           - The examined portion of the ileum was normal.                           - Polypoid lesion at the appendiceal orifice.                            Complete removal was accomplished.                           - One 2 mm polyp  in the ascending colon, removed                            with a cold biopsy forceps. Resected and retrieved.                           - Three 1 to 7 mm polyps in the rectum, removed                            with a cold snare.  Resected and retrieved. Clip (MR                            conditional) was placed on one of the oozing sites.                           - Diverticulosis in the recto-sigmoid colon, in the                            sigmoid colon and in the descending colon.                           - Diverticulosis in the sigmoid colon. Erythema was                            seen in association with the diverticular opening                            of these few diverticula - concerning for possible                            diverticulitis.                           - Normal mucosa in the entire examined colon                            otherwise.                           - Non-bleeding non-thrombosed external and internal                            hemorrhoids. Recommendation:           - The patient will be observed post-procedure,                            until all discharge criteria are met.                           - Discharge patient to home.                           - Patient has a contact number available for  emergencies. The signs and symptoms of potential                            delayed complications were discussed with the                            patient. Return to normal activities tomorrow.                            Written discharge instructions were provided to the                            patient.                           - High fiber diet.                           - Use fiber, for example Citrucel, Fibercon, Konsyl                            or Metamucil.                           - Continue present medications.                           - Await pathology results.                           - Repeat colonoscopy in 3 - 5 years for                            surveillance based on pathology results and                            findings of adenomatous tissue.                           - If there is adenomatous tissue on the appendiceal                             orifice, I would proceed with a 1-year follow up                            colonoscopy to ensure in the setting of piecemeal                            removal that there is no residual or recurrent                            tissue (based on pathology).                           - Will plan to give Ciprofloxacin 500 BID & Flagyl  500 mg BID x 5-days to treat possible                            diverticulitis, though unlikely based on patient's                            symptoms prior to procedure.                           - The findings and recommendations were discussed                            with the patient.                           - The findings and recommendations were discussed                            with the patient's family. Justice Britain, MD 06/05/2018 10:41:36 AM

## 2018-06-05 NOTE — Progress Notes (Signed)
Report given to PACU, vss 

## 2018-06-05 NOTE — Patient Instructions (Addendum)
YOU HAD AN ENDOSCOPIC PROCEDURE TODAY AT [facility]:   Refer to the procedure report that was given to you for any specific questions about what was found during the examination.  If the procedure report does not answer your questions, please call your gastroenterologist to clarify.  If you requested that your care partner not be given the details of your procedure findings, then the procedure report has been included in a sealed envelope for you to review at your convenience later.  YOU SHOULD EXPECT: Some feelings of bloating in the abdomen. Passage of more gas than usual.  Walking can help get rid of the air that was put into your GI tract during the procedure and reduce the bloating. If you had a lower endoscopy (such as a colonoscopy or flexible sigmoidoscopy) you may notice spotting of blood in your stool or on the toilet paper. If you underwent a bowel prep for your procedure, you may not have a normal bowel movement for a few days.  Please Note:  You might notice some irritation and congestion in your nose or some drainage.  This is from the oxygen used during your procedure.  There is no need for concern and it should clear up in a day or so.  SYMPTOMS TO REPORT IMMEDIATELY:   Following lower endoscopy (colonoscopy or flexible sigmoidoscopy):  Excessive amounts of blood in the stool  Significant tenderness or worsening of abdominal pains  Swelling of the abdomen that is new, acute  Fever of 100F or higher   For urgent or emergent issues, a gastroenterologist can be reached at any hour by calling [phone].   DIET:  We do recommend a small meal at first, but then you may proceed to your regular diet.  Drink plenty of fluids but you should avoid alcoholic beverages for 24 hours.  ACTIVITY:  You should plan to take it easy for the rest of today and you should NOT DRIVE or use heavy machinery until tomorrow (because of the sedation medicines used during the test).     FOLLOW UP: Our staff will call the number listed on your records the next business day following your procedure to check on you and address any questions or concerns that you may have regarding the information given to you following your procedure. If we do not reach you, we will leave a message.  However, if you are feeling well and you are not experiencing any problems, there is no need to return our call.  We will assume that you have returned to your regular daily activities without incident.  If any biopsies were taken you will be contacted by phone or by letter within the next 1-3 weeks.  Please call us at [phone] if you have not heard about the biopsies in 3 weeks.    SIGNATURES/CONFIDENTIALITY: You and/or your care partner have signed paperwork which will be entered into your electronic medical record.  These signatures attest to the fact that that the information above on your After Visit Summary has been reviewed and is understood.  Full responsibility of the confidentiality of this discharge information lies with you and/or your care-partner.    Handouts were given to your care partner on polyps, hemorrhoids, diverticulosis, and a high fiber diet with liberal fluid intake. Two antibiotics were sent to [facility] in [place], [place].  Please pick up Rxs.   Use over the counter Fiber Supplement with a liberal amount of fluids. You may  resume your current medications today. Await biopsy results. Please call if any questions or concerns.   

## 2018-06-06 ENCOUNTER — Telehealth: Payer: Self-pay | Admitting: Gastroenterology

## 2018-06-06 ENCOUNTER — Telehealth: Payer: Self-pay | Admitting: *Deleted

## 2018-06-06 NOTE — Telephone Encounter (Signed)
The patient has been notified that we will call when the path report has resulted all questions answered.

## 2018-06-06 NOTE — Telephone Encounter (Signed)
Sister called wanting to know if follow up visit will be scheduled after Dr Rush Landmark gets test results

## 2018-06-06 NOTE — Telephone Encounter (Signed)
  Follow up Call-  Call back number 06/05/2018  Post procedure Call Back phone  # [phone]  Permission to leave phone message Yes  Some recent data might be hidden     Patient questions:  Do you have a fever, pain , or abdominal swelling? No. Pain Score  0 *  Have you tolerated food without any problems? Yes.    Have you been able to return to your normal activities? Yes.    Do you have any questions about your discharge instructions: Diet   No. Medications  No. Follow up visit  No.  Do you have questions or concerns about your Care? No.  Actions: * If pain score is 4 or above: No action needed, pain <4.

## 2018-06-09 ENCOUNTER — Encounter: Payer: Self-pay | Admitting: Gastroenterology

## 2018-06-19 NOTE — Telephone Encounter (Signed)
Joel Valdez. [place] [address]  Dear Mr. Muzquiz,  The pathology from your colonoscopy has returned. The tissue at the appendix orifice returned as benign colonic tissue and there is no evidence of polyp tissue there that is good news. The other polyps that were removed returned as precancerous tubular adenomas.  This means that they had the potential to change into cancer over time had they not been removed.  They have been removed.  I recommend you have a repeat colonoscopy in 3 years to determine if you have developed any new polyps and to screen for colorectal cancer.   If you develop any new rectal bleeding, abdominal pain or significant bowel habit changes, please contact us before then at Dept: [phone].  Please call us if you have persistent problems or have questions about your condition that have not been fully answered at this time.  Sincerely,  Irving Copas., MD

## 2018-06-19 NOTE — Telephone Encounter (Signed)
Pt called to discuss path results.  Letter was mailed 2.21.20 but the pt stated he has not yet received the letter.

## 2018-06-19 NOTE — Telephone Encounter (Signed)
The patient has been notified of this information and all questions answered.

## 2018-06-22 DIAGNOSIS — H40051 Ocular hypertension, right eye: Secondary | ICD-10-CM | POA: Diagnosis not present

## 2018-06-22 DIAGNOSIS — H2511 Age-related nuclear cataract, right eye: Secondary | ICD-10-CM | POA: Diagnosis not present

## 2018-06-22 DIAGNOSIS — H2522 Age-related cataract, morgagnian type, left eye: Secondary | ICD-10-CM | POA: Diagnosis not present

## 2018-06-22 DIAGNOSIS — H4089 Other specified glaucoma: Secondary | ICD-10-CM | POA: Diagnosis not present

## 2018-06-27 ENCOUNTER — Other Ambulatory Visit: Payer: Self-pay | Admitting: Physician Assistant

## 2018-06-27 DIAGNOSIS — B029 Zoster without complications: Secondary | ICD-10-CM

## 2018-07-13 ENCOUNTER — Other Ambulatory Visit: Payer: Self-pay | Admitting: Physician Assistant

## 2018-07-24 ENCOUNTER — Other Ambulatory Visit: Payer: Self-pay

## 2018-07-24 ENCOUNTER — Telehealth (INDEPENDENT_AMBULATORY_CARE_PROVIDER_SITE_OTHER): Payer: Medicare Other | Admitting: Gastroenterology

## 2018-07-24 DIAGNOSIS — Z86010 Personal history of colonic polyps: Secondary | ICD-10-CM | POA: Diagnosis not present

## 2018-07-24 DIAGNOSIS — K5730 Diverticulosis of large intestine without perforation or abscess without bleeding: Secondary | ICD-10-CM

## 2018-07-24 NOTE — Patient Instructions (Signed)
If you are age 74 or older, your body mass index should be between 23-30. Your There is no height or weight on file to calculate BMI. If this is out of the aforementioned range listed, please consider follow up with your Primary Care Provider.  If you are age 64 or younger, your body mass index should be between 19-25. Your There is no height or weight on file to calculate BMI. If this is out of the aformentioned range listed, please consider follow up with your Primary Care Provider.     Thank you for choosing me and [facility].  Dr. Mansouraty   
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
## 2018-07-27 ENCOUNTER — Telehealth: Payer: Self-pay | Admitting: Neurology

## 2018-07-27 NOTE — Telephone Encounter (Signed)
Pt's sister Linda/DPR said her brother rec'd a call about setting up webex for the appt on 4/16. She said he would not be able to set up webex and does not always know how to answer questions well either. She is not able to go to his house since they are trying to keep distancing. She is wanting to know if it would be ok to push the appt out. She states he is not having any issues and no decline in memory. Please call to her advise on Monday. She is aware the clinic is closed on Fridays.

## 2018-07-30 ENCOUNTER — Encounter: Payer: Self-pay | Admitting: Physician Assistant

## 2018-07-30 ENCOUNTER — Ambulatory Visit: Payer: Self-pay | Admitting: Physician Assistant

## 2018-07-30 ENCOUNTER — Other Ambulatory Visit: Payer: Self-pay

## 2018-07-30 ENCOUNTER — Ambulatory Visit: Payer: Medicare HMO | Admitting: Neurology

## 2018-07-30 ENCOUNTER — Inpatient Hospital Stay (HOSPITAL_COMMUNITY)
Admission: EM | Admit: 2018-07-30 | Discharge: 2018-08-15 | DRG: 208 | Disposition: A | Discharge status: Home Health | Payer: Medicare Other | Attending: Internal Medicine | Admitting: Internal Medicine

## 2018-07-30 ENCOUNTER — Emergency Department (HOSPITAL_COMMUNITY): Payer: Medicare Other

## 2018-07-30 ENCOUNTER — Encounter (HOSPITAL_COMMUNITY): Payer: Self-pay

## 2018-07-30 ENCOUNTER — Ambulatory Visit (INDEPENDENT_AMBULATORY_CARE_PROVIDER_SITE_OTHER): Payer: Medicare Other | Admitting: Physician Assistant

## 2018-07-30 DIAGNOSIS — J1289 Other viral pneumonia: Secondary | ICD-10-CM | POA: Diagnosis not present

## 2018-07-30 DIAGNOSIS — I248 Other forms of acute ischemic heart disease: Secondary | ICD-10-CM | POA: Diagnosis present

## 2018-07-30 DIAGNOSIS — Z8261 Family history of arthritis: Secondary | ICD-10-CM | POA: Diagnosis not present

## 2018-07-30 DIAGNOSIS — F329 Major depressive disorder, single episode, unspecified: Secondary | ICD-10-CM | POA: Diagnosis present

## 2018-07-30 DIAGNOSIS — J61 Pneumoconiosis due to asbestos and other mineral fibers: Secondary | ICD-10-CM | POA: Diagnosis not present

## 2018-07-30 DIAGNOSIS — J939 Pneumothorax, unspecified: Secondary | ICD-10-CM | POA: Diagnosis not present

## 2018-07-30 DIAGNOSIS — E785 Hyperlipidemia, unspecified: Secondary | ICD-10-CM | POA: Diagnosis present

## 2018-07-30 DIAGNOSIS — Z23 Encounter for immunization: Secondary | ICD-10-CM

## 2018-07-30 DIAGNOSIS — Z978 Presence of other specified devices: Secondary | ICD-10-CM | POA: Diagnosis not present

## 2018-07-30 DIAGNOSIS — J4522 Mild intermittent asthma with status asthmaticus: Secondary | ICD-10-CM | POA: Diagnosis not present

## 2018-07-30 DIAGNOSIS — F419 Anxiety disorder, unspecified: Secondary | ICD-10-CM | POA: Diagnosis present

## 2018-07-30 DIAGNOSIS — Z7952 Long term (current) use of systemic steroids: Secondary | ICD-10-CM

## 2018-07-30 DIAGNOSIS — E876 Hypokalemia: Secondary | ICD-10-CM | POA: Diagnosis not present

## 2018-07-30 DIAGNOSIS — I952 Hypotension due to drugs: Secondary | ICD-10-CM | POA: Diagnosis not present

## 2018-07-30 DIAGNOSIS — R0902 Hypoxemia: Secondary | ICD-10-CM | POA: Diagnosis not present

## 2018-07-30 DIAGNOSIS — R6521 Severe sepsis with septic shock: Secondary | ICD-10-CM | POA: Diagnosis not present

## 2018-07-30 DIAGNOSIS — K219 Gastro-esophageal reflux disease without esophagitis: Secondary | ICD-10-CM | POA: Diagnosis present

## 2018-07-30 DIAGNOSIS — H409 Unspecified glaucoma: Secondary | ICD-10-CM | POA: Diagnosis present

## 2018-07-30 DIAGNOSIS — B349 Viral infection, unspecified: Secondary | ICD-10-CM

## 2018-07-30 DIAGNOSIS — R05 Cough: Secondary | ICD-10-CM | POA: Diagnosis not present

## 2018-07-30 DIAGNOSIS — Z79818 Long term (current) use of other agents affecting estrogen receptors and estrogen levels: Secondary | ICD-10-CM | POA: Diagnosis not present

## 2018-07-30 DIAGNOSIS — C61 Malignant neoplasm of prostate: Secondary | ICD-10-CM | POA: Diagnosis present

## 2018-07-30 DIAGNOSIS — H9191 Unspecified hearing loss, right ear: Secondary | ICD-10-CM | POA: Diagnosis not present

## 2018-07-30 DIAGNOSIS — J9621 Acute and chronic respiratory failure with hypoxia: Secondary | ICD-10-CM | POA: Diagnosis present

## 2018-07-30 DIAGNOSIS — J440 Chronic obstructive pulmonary disease with acute lower respiratory infection: Secondary | ICD-10-CM | POA: Diagnosis present

## 2018-07-30 DIAGNOSIS — J9601 Acute respiratory failure with hypoxia: Secondary | ICD-10-CM

## 2018-07-30 DIAGNOSIS — Z20828 Contact with and (suspected) exposure to other viral communicable diseases: Secondary | ICD-10-CM | POA: Diagnosis present

## 2018-07-30 DIAGNOSIS — J9690 Respiratory failure, unspecified, unspecified whether with hypoxia or hypercapnia: Secondary | ICD-10-CM

## 2018-07-30 DIAGNOSIS — Z8249 Family history of ischemic heart disease and other diseases of the circulatory system: Secondary | ICD-10-CM | POA: Diagnosis not present

## 2018-07-30 DIAGNOSIS — J8410 Pulmonary fibrosis, unspecified: Secondary | ICD-10-CM | POA: Diagnosis not present

## 2018-07-30 DIAGNOSIS — R7989 Other specified abnormal findings of blood chemistry: Secondary | ICD-10-CM

## 2018-07-30 DIAGNOSIS — Z888 Allergy status to other drugs, medicaments and biological substances status: Secondary | ICD-10-CM

## 2018-07-30 DIAGNOSIS — J189 Pneumonia, unspecified organism: Secondary | ICD-10-CM | POA: Diagnosis not present

## 2018-07-30 DIAGNOSIS — R0602 Shortness of breath: Secondary | ICD-10-CM

## 2018-07-30 DIAGNOSIS — Z9289 Personal history of other medical treatment: Secondary | ICD-10-CM

## 2018-07-30 DIAGNOSIS — G9340 Encephalopathy, unspecified: Secondary | ICD-10-CM | POA: Diagnosis not present

## 2018-07-30 DIAGNOSIS — I10 Essential (primary) hypertension: Secondary | ICD-10-CM | POA: Diagnosis present

## 2018-07-30 DIAGNOSIS — R0603 Acute respiratory distress: Secondary | ICD-10-CM

## 2018-07-30 DIAGNOSIS — R918 Other nonspecific abnormal finding of lung field: Secondary | ICD-10-CM | POA: Diagnosis not present

## 2018-07-30 DIAGNOSIS — T4275XA Adverse effect of unspecified antiepileptic and sedative-hypnotic drugs, initial encounter: Secondary | ICD-10-CM | POA: Diagnosis not present

## 2018-07-30 DIAGNOSIS — A419 Sepsis, unspecified organism: Secondary | ICD-10-CM | POA: Diagnosis not present

## 2018-07-30 DIAGNOSIS — Z87891 Personal history of nicotine dependence: Secondary | ICD-10-CM

## 2018-07-30 DIAGNOSIS — J95811 Postprocedural pneumothorax: Secondary | ICD-10-CM | POA: Diagnosis not present

## 2018-07-30 DIAGNOSIS — M069 Rheumatoid arthritis, unspecified: Secondary | ICD-10-CM | POA: Diagnosis present

## 2018-07-30 DIAGNOSIS — E875 Hyperkalemia: Secondary | ICD-10-CM | POA: Diagnosis not present

## 2018-07-30 DIAGNOSIS — I499 Cardiac arrhythmia, unspecified: Secondary | ICD-10-CM | POA: Diagnosis not present

## 2018-07-30 DIAGNOSIS — F32A Depression, unspecified: Secondary | ICD-10-CM | POA: Diagnosis present

## 2018-07-30 DIAGNOSIS — M545 Low back pain: Secondary | ICD-10-CM | POA: Diagnosis present

## 2018-07-30 DIAGNOSIS — R0689 Other abnormalities of breathing: Secondary | ICD-10-CM | POA: Diagnosis not present

## 2018-07-30 DIAGNOSIS — J9691 Respiratory failure, unspecified with hypoxia: Secondary | ICD-10-CM | POA: Diagnosis not present

## 2018-07-30 HISTORY — DX: Other amnesia: R41.3

## 2018-07-30 LAB — RESPIRATORY PANEL BY PCR

## 2018-07-30 LAB — URINALYSIS, ROUTINE W REFLEX MICROSCOPIC
Bacteria, UA: NONE SEEN
Bilirubin Urine: NEGATIVE
Glucose, UA: NEGATIVE mg/dL
Ketones, ur: NEGATIVE mg/dL
Leukocytes,Ua: NEGATIVE
Nitrite: NEGATIVE
Protein, ur: NEGATIVE mg/dL
Specific Gravity, Urine: 1.015 (ref 1.005–1.030)
pH: 5 (ref 5.0–8.0)

## 2018-07-30 LAB — BASIC METABOLIC PANEL
Anion gap: 11 (ref 5–15)
BUN: 25 mg/dL — ABNORMAL HIGH (ref 8–23)
CO2: 19 mmol/L — ABNORMAL LOW (ref 22–32)
Calcium: 8.7 mg/dL — ABNORMAL LOW (ref 8.9–10.3)
Chloride: 103 mmol/L (ref 98–111)
Creatinine, Ser: 1.23 mg/dL (ref 0.61–1.24)
GFR calc Af Amer: >60 mL/min (ref >60)
GFR calc non Af Amer: 58 mL/min — ABNORMAL LOW (ref >60)
Glucose, Bld: 103 mg/dL — ABNORMAL HIGH (ref 70–99)
Potassium: 4.3 mmol/L (ref 3.5–5.1)
Sodium: 133 mmol/L — ABNORMAL LOW (ref 135–145)

## 2018-07-30 LAB — CBC WITH DIFFERENTIAL/PLATELET
Abs Immature Granulocytes: 0.08 10*3/uL — ABNORMAL HIGH (ref 0.00–0.07)
Basophils Absolute: 0.1 10*3/uL (ref 0.0–0.1)
Basophils Relative: 1 %
Eosinophils Absolute: 0.3 10*3/uL (ref 0.0–0.5)
Eosinophils Relative: 2 %
HCT: 35.9 % — ABNORMAL LOW (ref 39.0–52.0)
Hemoglobin: 11.9 g/dL — ABNORMAL LOW (ref 13.0–17.0)
Immature Granulocytes: 1 %
Lymphocytes Relative: 6 %
Lymphs Abs: 0.8 10*3/uL (ref 0.7–4.0)
MCH: 29.7 pg (ref 26.0–34.0)
MCHC: 33.1 g/dL (ref 30.0–36.0)
MCV: 89.5 fL (ref 80.0–100.0)
Monocytes Absolute: 0.6 10*3/uL (ref 0.1–1.0)
Monocytes Relative: 5 %
Neutro Abs: 10 10*3/uL — ABNORMAL HIGH (ref 1.7–7.7)
Neutrophils Relative %: 85 %
Platelets: 392 10*3/uL (ref 150–400)
RBC: 4.01 MIL/uL — ABNORMAL LOW (ref 4.22–5.81)
RDW: 16.1 % — ABNORMAL HIGH (ref 11.5–15.5)
WBC: 11.9 10*3/uL — ABNORMAL HIGH (ref 4.0–10.5)
nRBC: 0 % (ref 0.0–0.2)

## 2018-07-30 LAB — LACTIC ACID, PLASMA
Lactic Acid, Venous: 1.5 mmol/L (ref 0.5–1.9)
Lactic Acid, Venous: 3.7 mmol/L (ref 0.5–1.9)

## 2018-07-30 LAB — TROPONIN I
Troponin I: 0.04 ng/mL (ref <0.03)
Troponin I: 0.04 ng/mL (ref <0.03)

## 2018-07-30 LAB — LACTATE DEHYDROGENASE: LDH: 221 U/L — ABNORMAL HIGH (ref 98–192)

## 2018-07-30 LAB — FERRITIN: Ferritin: 167 ng/mL (ref 24–336)

## 2018-07-30 LAB — INFLUENZA PANEL BY PCR (TYPE A & B)
Influenza A By PCR: NEGATIVE
Influenza B By PCR: NEGATIVE

## 2018-07-30 LAB — PROCALCITONIN: Procalcitonin: <0.1 ng/mL

## 2018-07-30 LAB — C-REACTIVE PROTEIN: CRP: 10.6 mg/dL — ABNORMAL HIGH (ref <1.0)

## 2018-07-30 LAB — BRAIN NATRIURETIC PEPTIDE: B Natriuretic Peptide: 94 pg/mL (ref 0.0–100.0)

## 2018-07-30 LAB — SEDIMENTATION RATE: Sed Rate: 61 mm/hr — ABNORMAL HIGH (ref 0–16)

## 2018-07-30 LAB — STREP PNEUMONIAE URINARY ANTIGEN: Strep Pneumo Urinary Antigen: NEGATIVE

## 2018-07-30 LAB — PROTIME-INR
INR: 1.1 (ref 0.8–1.2)
Prothrombin Time: 14.1 seconds (ref 11.4–15.2)

## 2018-07-30 LAB — D-DIMER, QUANTITATIVE: D-Dimer, Quant: 3.47 ug/mL-FEU — ABNORMAL HIGH (ref 0.00–0.50)

## 2018-07-30 MED ORDER — VITAMIN C 500 MG PO TABS
500.0000 mg | ORAL_TABLET | Freq: Every day | ORAL | Status: DC
  Administered 2018-07-30 – 2018-07-31 (×2): 500 mg via ORAL
  Filled 2018-07-30 (×2): qty 1

## 2018-07-30 MED ORDER — GUAIFENESIN-DM 100-10 MG/5ML PO SYRP
10.0000 mL | ORAL_SOLUTION | ORAL | Status: DC | PRN
  Administered 2018-07-31 – 2018-08-05 (×5): 10 mL via ORAL
  Filled 2018-07-30 (×5): qty 10

## 2018-07-30 MED ORDER — PREDNISONE 10 MG PO TABS
10.0000 mg | ORAL_TABLET | Freq: Every day | ORAL | Status: DC
  Administered 2018-07-30 – 2018-08-09 (×11): 10 mg via ORAL
  Filled 2018-07-30 (×12): qty 1

## 2018-07-30 MED ORDER — ALBUTEROL SULFATE (2.5 MG/3ML) 0.083% IN NEBU: 2.5000 mg | INHALATION_SOLUTION | Freq: Once | RESPIRATORY_TRACT | Status: DC

## 2018-07-30 MED ORDER — ENOXAPARIN SODIUM 40 MG/0.4ML ~~LOC~~ SOLN
40.0000 mg | SUBCUTANEOUS | Status: DC
  Administered 2018-07-31 – 2018-08-15 (×15): 40 mg via SUBCUTANEOUS
  Filled 2018-07-30 (×16): qty 0.4

## 2018-07-30 MED ORDER — HEPARIN SODIUM (PORCINE) 5000 UNIT/ML IJ SOLN
5000.0000 [IU] | Freq: Three times a day (TID) | INTRAMUSCULAR | Status: DC
  Administered 2018-07-30: 15:00:00 5000 [IU] via SUBCUTANEOUS
  Filled 2018-07-30: qty 1

## 2018-07-30 MED ORDER — HYDROXYCHLOROQUINE SULFATE 200 MG PO TABS
200.0000 mg | ORAL_TABLET | Freq: Two times a day (BID) | ORAL | Status: DC
  Administered 2018-07-31 – 2018-08-01 (×3): 200 mg via ORAL
  Filled 2018-07-30 (×2): qty 1

## 2018-07-30 MED ORDER — IPRATROPIUM-ALBUTEROL 20-100 MCG/ACT IN AERS
1.0000 | INHALATION_SPRAY | Freq: Four times a day (QID) | RESPIRATORY_TRACT | Status: DC
  Administered 2018-07-31 – 2018-08-01 (×6): 1 via RESPIRATORY_TRACT
  Filled 2018-07-30 (×2): qty 4

## 2018-07-30 MED ORDER — VALACYCLOVIR HCL 500 MG PO TABS
1000.0000 mg | ORAL_TABLET | Freq: Three times a day (TID) | ORAL | Status: DC
  Administered 2018-07-30 – 2018-08-01 (×5): 1000 mg via ORAL
  Filled 2018-07-30 (×8): qty 2

## 2018-07-30 MED ORDER — ZINC SULFATE 220 (50 ZN) MG PO CAPS: 220.0000 mg | ORAL_CAPSULE | Freq: Every day | ORAL | Status: DC

## 2018-07-30 MED ORDER — AMLODIPINE BESYLATE 5 MG PO TABS
2.5000 mg | ORAL_TABLET | Freq: Every day | ORAL | Status: DC
  Administered 2018-07-30 – 2018-07-31 (×2): 2.5 mg via ORAL
  Filled 2018-07-30 (×2): qty 1

## 2018-07-30 MED ORDER — HYDROXYCHLOROQUINE SULFATE 200 MG PO TABS
400.0000 mg | ORAL_TABLET | Freq: Two times a day (BID) | ORAL | Status: AC
  Administered 2018-07-30 – 2018-07-31 (×2): 400 mg via ORAL
  Filled 2018-07-30 (×3): qty 2

## 2018-07-30 MED ORDER — LORAZEPAM 1 MG PO TABS
1.0000 mg | ORAL_TABLET | Freq: Once | ORAL | Status: AC
  Administered 2018-07-30: 1 mg via ORAL
  Filled 2018-07-30: qty 1

## 2018-07-30 MED ORDER — ALBUTEROL SULFATE HFA 108 (90 BASE) MCG/ACT IN AERS: 8.0000 | INHALATION_SPRAY | RESPIRATORY_TRACT | Status: DC

## 2018-07-30 MED ORDER — ATORVASTATIN CALCIUM 10 MG PO TABS
10.0000 mg | ORAL_TABLET | Freq: Every day | ORAL | Status: DC
  Administered 2018-07-30 – 2018-08-01 (×3): 10 mg via ORAL
  Filled 2018-07-30 (×2): qty 1

## 2018-07-30 MED ORDER — SODIUM CHLORIDE 0.9 % IV SOLN
INTRAVENOUS | Status: DC
  Administered 2018-07-30 (×2): via INTRAVENOUS

## 2018-07-30 MED ORDER — VITAMIN C 500 MG PO TABS: 500.0000 mg | ORAL_TABLET | Freq: Every day | ORAL | Status: DC

## 2018-07-30 MED ORDER — ONDANSETRON HCL 4 MG PO TABS: 4.0000 mg | ORAL_TABLET | Freq: Four times a day (QID) | ORAL | Status: DC | PRN

## 2018-07-30 MED ORDER — ZINC SULFATE 220 (50 ZN) MG PO CAPS
220.0000 mg | ORAL_CAPSULE | Freq: Every day | ORAL | Status: DC
  Administered 2018-07-30 – 2018-08-01 (×3): 220 mg via ORAL
  Filled 2018-07-30 (×3): qty 1

## 2018-07-30 MED ORDER — SODIUM CHLORIDE 0.9 % IV SOLN
500.0000 mg | INTRAVENOUS | Status: DC
  Administered 2018-07-31 – 2018-08-08 (×9): 500 mg via INTRAVENOUS
  Filled 2018-07-30 (×9): qty 500

## 2018-07-30 MED ORDER — SODIUM CHLORIDE 0.9 % IV SOLN
INTRAVENOUS | Status: DC
  Administered 2018-07-30: 14:00:00 via INTRAVENOUS

## 2018-07-30 MED ORDER — ACETAMINOPHEN 325 MG PO TABS: 650.0000 mg | ORAL_TABLET | Freq: Four times a day (QID) | ORAL | Status: DC | PRN

## 2018-07-30 MED ORDER — ALBUTEROL SULFATE HFA 108 (90 BASE) MCG/ACT IN AERS
8.0000 | INHALATION_SPRAY | RESPIRATORY_TRACT | Status: AC
  Administered 2018-07-30: 14:00:00 8 via RESPIRATORY_TRACT
  Filled 2018-07-30: qty 6.7

## 2018-07-30 MED ORDER — ESCITALOPRAM OXALATE 20 MG PO TABS
10.0000 mg | ORAL_TABLET | Freq: Every day | ORAL | Status: DC
  Administered 2018-07-30 – 2018-08-15 (×17): 10 mg via ORAL
  Filled 2018-07-30 (×17): qty 1

## 2018-07-30 MED ORDER — FOLIC ACID 1 MG PO TABS
1.0000 mg | ORAL_TABLET | Freq: Every day | ORAL | Status: DC
  Administered 2018-07-30 – 2018-08-01 (×3): 1 mg via ORAL
  Filled 2018-07-30 (×3): qty 1

## 2018-07-30 MED ORDER — TIMOLOL MALEATE 0.5 % OP SOLN
1.0000 [drp] | Freq: Every day | OPHTHALMIC | Status: DC
  Administered 2018-07-31 – 2018-08-15 (×16): 1 [drp] via OPHTHALMIC
  Filled 2018-07-30: qty 5

## 2018-07-30 MED ORDER — HYDROXYCHLOROQUINE SULFATE 200 MG PO TABS
200.0000 mg | ORAL_TABLET | Freq: Two times a day (BID) | ORAL | Status: DC
  Filled 2018-07-30 (×6): qty 1

## 2018-07-30 MED ORDER — ONDANSETRON HCL 4 MG/2ML IJ SOLN: 4.0000 mg | Freq: Four times a day (QID) | INTRAMUSCULAR | Status: DC | PRN

## 2018-07-30 MED ORDER — LATANOPROST 0.005 % OP SOLN
1.0000 [drp] | Freq: Every day | OPHTHALMIC | Status: DC
  Administered 2018-07-30 – 2018-08-14 (×15): 1 [drp] via OPHTHALMIC
  Filled 2018-07-30 (×2): qty 2.5

## 2018-07-30 MED ORDER — MOMETASONE FURO-FORMOTEROL FUM 200-5 MCG/ACT IN AERO
2.0000 | INHALATION_SPRAY | Freq: Two times a day (BID) | RESPIRATORY_TRACT | Status: DC
  Administered 2018-07-31 – 2018-08-15 (×27): 2 via RESPIRATORY_TRACT
  Filled 2018-07-30 (×2): qty 8.8

## 2018-07-30 MED ORDER — IPRATROPIUM-ALBUTEROL 0.5-2.5 (3) MG/3ML IN SOLN: 3.0000 mL | Freq: Once | RESPIRATORY_TRACT | Status: DC

## 2018-07-30 MED ORDER — LEVOFLOXACIN IN D5W 750 MG/150ML IV SOLN
750.0000 mg | INTRAVENOUS | Status: DC
  Administered 2018-07-30: 14:00:00 750 mg via INTRAVENOUS
  Filled 2018-07-30: qty 150

## 2018-07-30 MED ORDER — HYDROCOD POLST-CPM POLST ER 10-8 MG/5ML PO SUER
5.0000 mL | Freq: Two times a day (BID) | ORAL | Status: DC | PRN
  Administered 2018-08-03 – 2018-08-05 (×2): 5 mL via ORAL
  Filled 2018-07-30 (×2): qty 5

## 2018-07-30 MED ORDER — PNEUMOCOCCAL VAC POLYVALENT 25 MCG/0.5ML IJ INJ
0.5000 mL | INJECTION | INTRAMUSCULAR | Status: AC
  Administered 2018-07-31: 13:00:00 0.5 mL via INTRAMUSCULAR
  Filled 2018-07-30 (×2): qty 0.5
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
## 2018-07-30 NOTE — ED Notes (Signed)
Date and time results received: 07/30/18 1511 (use smartphrase ".now" to insert current time)  Test: lactic acid Critical Value: 3.7  Name of Provider Notified: Dr. Dyann Kief  Orders Received? Or Actions Taken?: none at this time

## 2018-07-30 NOTE — Progress Notes (Signed)
I have discussed the procedure for the virtual visit with the patient who has given consent to proceed with assessment and treatment.   Shannah Conteh S Caelen Higinbotham, CMA     

## 2018-07-30 NOTE — ED Notes (Signed)
[facility] left unit with patient

## 2018-07-30 NOTE — Telephone Encounter (Signed)
Recommend telephone encounter. Could be bronchitis but need to rule out concern for COVID. Please schedule.

## 2018-07-30 NOTE — ED Triage Notes (Signed)
SOB started a week ago and has progressively gotten worse. Dry cough. O2 sats 86% on RA. NSR on monitor. Non rebreather on patient with 98%. 20 G Left AC.

## 2018-07-30 NOTE — Progress Notes (Signed)
Virtual Visit via Telephone Note I connected with Dominica Severin L. Applin on 07/30/18 at 11:00 AM EDT by telephone and verified that I am speaking with the correct person using two identifiers.   I discussed the limitations, risks, security and privacy concerns of performing an evaluation and management service by telephone and the availability of in person appointments. I also discussed with the patient that there may be a patient responsible charge related to this service. The patient expressed understanding and agreed to proceed.  History of Present Illness: Patient presents via phone c/o about 1 week of gradually worsening dry cough without chest congestion, sinus pain, ear pain. Notes some mild nasal congestion. Denies wheezing but notes some chest tightness. Notes feeling winded at rest even without talking. Denies recent travel or sick contact. Denies fever. Denies being out of the house since symptom onset. Has been visiting a friend the week prior who has been sick but not sure what is going on with them.   Observations/Objective: SOB with talking. Worsening during HPI. Has to have wife finish conversation. Had them apply Pulse ox to him with stable pulse but O2 at 80%. Declines letting them take him to ER. Discussed this was needed ASAP. Called EMS personally and sending them to home since he refuses to go on his own. Concern for COVID. They want to give him a neb treatment of wife's but recommend they avoid this due to potential aerosolizing of infection.  Assessment and Plan: Called 911 and routed them to patient home. Instructions on what to do and not to do reviewed with wife. Avoiding use of her neb for him due to concern of aerosolizing potential COVID.  Follow Up Instructions:   I discussed the assessment and treatment plan with the patient. The patient was provided an opportunity to ask questions and all were answered. The patient agreed with the plan and demonstrated an understanding of  the instructions.   The patient was advised to call back or seek an in-person evaluation if the symptoms worsen or if the condition fails to improve as anticipated.   Leeanne Rio, PA-C

## 2018-07-30 NOTE — Telephone Encounter (Signed)
Wife reports pt.'s pt has had a cough x 1-2 weeks, chills, weakness. No fever. Has some shortness of breath that is constant. Drinking and eating "ok" per wife.Feels like "he has bronchitis or pneumonia." Spoke with Levada Dy in the practice and will forward triage for review.  Answer Assessment - Initial Assessment Questions 1. ONSET: "When did the cough begin?"      1-2 weeks ago 2. SEVERITY: "How bad is the cough today?"      Mild 3. RESPIRATORY DISTRESS: "Describe your breathing."      Some trouble breathing 4. FEVER: "Do you have a fever?" If so, ask: "What is your temperature, how was it measured, and when did it start?"     No fever 5. HEMOPTYSIS: "Are you coughing up any blood?" If so ask: "How much?" (flecks, streaks, tablespoons, etc.)     No 6. TREATMENT: "What have you done so far to treat the cough?" (e.g., meds, fluids, humidifier)     No 7. CARDIAC HISTORY: "Do you have any history of heart disease?" (e.g., heart attack, congestive heart failure)      No 8. LUNG HISTORY: "Do you have any history of lung disease?"  (e.g., pulmonary embolus, asthma, emphysema)     No 9. PE RISK FACTORS: "Do you have a history of blood clots?" (or: recent major surgery, recent prolonged travel, bedridden)     No 10. OTHER SYMPTOMS: "Do you have any other symptoms? (e.g., runny nose, wheezing, chest pain)       History of TB 11. PREGNANCY: "Is there any chance you are pregnant?" "When was your last menstrual period?"       N/A 12. TRAVEL: "Have you traveled out of the country in the last month?" (e.g., travel history, exposures)       No  Protocols used: COUGH - ACUTE NON-PRODUCTIVE-A-AH

## 2018-07-30 NOTE — ED Notes (Signed)
Date and time results received: 07/30/18 1315 (use smartphrase ".now" to insert current time)  Test: Troponin Critical Value: 0.04  Name of Provider Notified: Dr. Thurnell Garbe  Orders Received? Or Actions Taken?: new orders in place

## 2018-07-30 NOTE — ED Notes (Signed)
[phone] family

## 2018-07-30 NOTE — Telephone Encounter (Signed)
Thank you Katie!
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
## 2018-07-30 NOTE — Telephone Encounter (Signed)
Patient has been scheduled for telephone call 07/30/18 @ 11 AM

## 2018-07-30 NOTE — ED Notes (Signed)
Pt now on 6L O2 [place]

## 2018-07-31 LAB — CBC WITH DIFFERENTIAL/PLATELET
Abs Immature Granulocytes: 0.08 10*3/uL — ABNORMAL HIGH (ref 0.00–0.07)
Basophils Absolute: 0 10*3/uL (ref 0.0–0.1)
Basophils Relative: 0 %
Eosinophils Absolute: 0.1 10*3/uL (ref 0.0–0.5)
Eosinophils Relative: 2 %
HCT: 34 % — ABNORMAL LOW (ref 39.0–52.0)
Hemoglobin: 11 g/dL — ABNORMAL LOW (ref 13.0–17.0)
Immature Granulocytes: 1 %
Lymphocytes Relative: 13 %
Lymphs Abs: 1.2 10*3/uL (ref 0.7–4.0)
MCH: 30.1 pg (ref 26.0–34.0)
MCHC: 32.4 g/dL (ref 30.0–36.0)
MCV: 92.9 fL (ref 80.0–100.0)
Monocytes Absolute: 0.9 10*3/uL (ref 0.1–1.0)
Monocytes Relative: 10 %
Neutro Abs: 6.9 10*3/uL (ref 1.7–7.7)
Neutrophils Relative %: 74 %
Platelets: 308 10*3/uL (ref 150–400)
RBC: 3.66 MIL/uL — ABNORMAL LOW (ref 4.22–5.81)
RDW: 16.2 % — ABNORMAL HIGH (ref 11.5–15.5)
WBC: 9.2 10*3/uL (ref 4.0–10.5)
nRBC: 0 % (ref 0.0–0.2)

## 2018-07-31 LAB — COMPREHENSIVE METABOLIC PANEL
ALT: 20 U/L (ref 0–44)
AST: 16 U/L (ref 15–41)
Albumin: 2.6 g/dL — ABNORMAL LOW (ref 3.5–5.0)
Alkaline Phosphatase: 47 U/L (ref 38–126)
Anion gap: 9 (ref 5–15)
BUN: 24 mg/dL — ABNORMAL HIGH (ref 8–23)
CO2: 19 mmol/L — ABNORMAL LOW (ref 22–32)
Calcium: 8.4 mg/dL — ABNORMAL LOW (ref 8.9–10.3)
Chloride: 106 mmol/L (ref 98–111)
Creatinine, Ser: 1.24 mg/dL (ref 0.61–1.24)
GFR calc Af Amer: >60 mL/min (ref >60)
GFR calc non Af Amer: 57 mL/min — ABNORMAL LOW (ref >60)
Glucose, Bld: 104 mg/dL — ABNORMAL HIGH (ref 70–99)
Potassium: 4.4 mmol/L (ref 3.5–5.1)
Sodium: 134 mmol/L — ABNORMAL LOW (ref 135–145)
Total Bilirubin: 0.2 mg/dL — ABNORMAL LOW (ref 0.3–1.2)
Total Protein: 5.9 g/dL — ABNORMAL LOW (ref 6.5–8.1)

## 2018-07-31 LAB — CK: Total CK: 28 U/L — ABNORMAL LOW (ref 49–397)

## 2018-07-31 LAB — BLOOD GAS, ARTERIAL
Acid-base deficit: 4.6 mmol/L — ABNORMAL HIGH (ref 0.0–2.0)
Bicarbonate: 18.6 mmol/L — ABNORMAL LOW (ref 20.0–28.0)
O2 Content: 6 L/min
O2 Saturation: 91.6 %
Patient temperature: 98.6
pCO2 arterial: 29.9 mmHg — ABNORMAL LOW (ref 32.0–48.0)
pH, Arterial: 7.411 (ref 7.350–7.450)
pO2, Arterial: 64.1 mmHg — ABNORMAL LOW (ref 83.0–108.0)

## 2018-07-31 LAB — C-REACTIVE PROTEIN: CRP: 9.8 mg/dL — ABNORMAL HIGH (ref <1.0)

## 2018-07-31 LAB — URINE CULTURE: Culture: NO GROWTH

## 2018-07-31 LAB — MAGNESIUM: Magnesium: 2.1 mg/dL (ref 1.7–2.4)

## 2018-07-31 MED ORDER — VITAMIN C 500 MG PO TABS
500.0000 mg | ORAL_TABLET | Freq: Two times a day (BID) | ORAL | Status: DC
  Administered 2018-07-31 – 2018-08-01 (×3): 500 mg via ORAL
  Filled 2018-07-31 (×2): qty 1

## 2018-07-31 MED ORDER — SODIUM CHLORIDE 0.9 % IV SOLN
1.0000 g | INTRAVENOUS | Status: DC
  Administered 2018-07-31 – 2018-08-09 (×10): 1 g via INTRAVENOUS
  Filled 2018-07-31 (×7): qty 1
  Filled 2018-07-31: qty 10
  Filled 2018-07-31 (×2): qty 1

## 2018-07-31 NOTE — Progress Notes (Signed)
Pt transferred to ICU/SD @ 2230

## 2018-07-31 NOTE — Plan of Care (Signed)
Pt still requiring 5 LPM .

## 2018-07-31 NOTE — Progress Notes (Signed)
PROGRESS NOTE    Joel Valdez  MEQ:[id number] DOB: Sep 19, 1945 DOA: 07/30/2018 PCP: [name], PA-C   Brief Narrative:72 y.o. male with a past medical history significant for anxiety/depression, asthma, gastroesophageal reflux disease, hypertension, hyperlipidemia, history of prostate cancer (currently receiving Lupron) and rheumatoid arthritis (chronically on a steroids and methotrexate); who presented to the hospital secondary to general malaise, chills, shortness of breath and cough that have been present for 1 week now.  Patient expressed no fever, no chest pain, no nausea, no vomiting, no hemoptysis, no melena, no hematochezia, no focal weakness or any sick contacts.  She reported trying his best to adhere to quarentine process. Due to his ongoing symptoms he was evaluated through telemedicine by PCP and advised to come to the emergency department for COVID-19 rule out and further evaluation and management.  Of note, he reported intermittent use of albuterol nebulizer at home; without improvement in symptoms. At time EMS arrived to his house and evaluated him O2 sat was 86% on RA.  In the ED chest x-ray demonstrated multifocal pneumonia with perihilar infiltrate; he has a temperature of 99.5, Respiratory rate of 26-28; elevated WBCs at 11.9; elevated sed rate at 61, LDH 221; d-dimer 3.47 and mild elevation of troponin of 0.04.  Influenza follow-up PCR was negative.  And the rest of admitting blood work as per COVID-19 protocol pending.  Patient received IV fluids, started on antibiotics and cultures taken. [facility] contacted to admit patient for further evaluation and management.  Assessment & Plan:   Principal Problem:   Pneumonia due to COVID-19 virus Active Problems:   Glaucoma   Essential hypertension, benign   Prostate cancer ([place])   Depression   Rheumatoid arthritis involving both hands ([place])   Essential hypertension   1 acute hypoxic respiratory failure in the setting of  asthma most likely secondary to COVID-19.  COVID-19 is pending at this time.  Chest x-ray showed bilateral left greater than right interstitial and airspace lung opacities consistent with multifocal pneumonia.  Patient reports being adherent to quarantine process and not having any sick contacts.  He is on 6 L of oxygen saturation 93%.  Patient did not have oxygen prior to admission to the hospital.  He has been started on azithromycin and Plaquenil.  I will add Rocephin to cover community-acquired pneumonia.  Continue MDI.  He was able to speak to me this morning without any respiratory distress.  Continue Combivent and Dulera.  Abnormal labs include elevated LDH of 221 CRP of 10.6 procalcitonin less than 0.10 d-dimer 3.47 sed rate 61.  Continue Tussionex, Dulera Robitussin and Combivent influenza panel respiratory virus panel and strep pneumo negative.  2 hypertension stable on Norvasc.  3 depression continue Lexapro  4 hyperlipidemia continue statins  5 rheumatoid arthritis continue prednisone  6 elevated troponin denies any chest pain no EKG changes of acute coronary syndrome  7 prostate cancer on Lupron at home every 3 months.  8 glaucoma continue Timoptic eyedrops  DVT prophylaxis:  Lovenox  code Status: Full code Family Communication: Discussed with his wife Kendell Sagraves over the phone Disposition Plan: Anticipate discharge back home once medically stable. Consults called: None   Estimated body mass index is 25.82 kg/m as calculated from the following:   Height as of this encounter: 5\' 6"  (1.676 m).   Weight as of this encounter: 72.6 kg.     Subjective: Patient resting in bed reports that he is not feeling well complains of generalized weakness and shortness  of breath and cough  Objective: Vitals:   07/31/18 0400 07/31/18 0500 07/31/18 0520 07/31/18 0600  BP: (!) 119/58 (!) 120/59  (!) 96/47  Pulse: 68 68  71  Resp: (!) 22 (!) 23  (!) 31  Temp: (!) 96.9 F (36.1 C)      TempSrc: Oral     SpO2: 96% 94% (!) 54% 93%  Weight:      Height:        Intake/Output Summary (Last 24 hours) at 07/31/2018 0819 Last data filed at 07/31/2018 0000 Gross per 24 hour  Intake 357.97 ml  Output 350 ml  Net 7.97 ml   Filed Weights   07/30/18 1210  Weight: 72.6 kg    Examination:  General exam: Appears calm and comfortable  Respiratory system: scattered wheezing  to auscultation. Respiratory effort normal. Cardiovascular system: S1 & S2 heard, RRR. No JVD, murmurs, rubs, gallops or clicks. No pedal edema. Gastrointestinal system: Abdomen is nondistended, soft and nontender. No organomegaly or masses felt. Normal bowel sounds heard. Central nervous system: Alert and oriented. No focal neurological deficits. Extremities: Symmetric 5 x 5 power. Skin: No rashes, lesions or ulcers Psychiatry: Judgement and insight appear normal. Mood & affect appropriate.     Data Reviewed: I have personally reviewed following labs and imaging studies  CBC: Recent Labs  Lab 07/30/18 1221 07/31/18 0500  WBC 11.9* 9.2  NEUTROABS 10.0* 6.9  HGB 11.9* 11.0*  HCT 35.9* 34.0*  MCV 89.5 92.9  PLT 392 539   Basic Metabolic Panel: Recent Labs  Lab 07/30/18 1221 07/31/18 0500  NA 133* 134*  K 4.3 4.4  CL 103 106  CO2 19* 19*  GLUCOSE 103* 104*  BUN 25* 24*  CREATININE 1.23 1.24  CALCIUM 8.7* 8.4*  MG  --  2.1   GFR: Estimated Creatinine Clearance: 47.9 mL/min (by C-G formula based on SCr of 1.24 mg/dL). Liver Function Tests: Recent Labs  Lab 07/31/18 0500  AST 16  ALT 20  ALKPHOS 47  BILITOT 0.2*  PROT 5.9*  ALBUMIN 2.6*   No results for input(s): LIPASE, AMYLASE in the last 168 hours. No results for input(s): AMMONIA in the last 168 hours. Coagulation Profile: Recent Labs  Lab 07/30/18 1221  INR 1.1   Cardiac Enzymes: Recent Labs  Lab 07/30/18 1221 07/30/18 1412 07/31/18 0500  CKTOTAL  --   --  28*  TROPONINI 0.04* 0.04*  --    BNP (last 3  results) No results for input(s): PROBNP in the last 8760 hours. HbA1C: No results for input(s): HGBA1C in the last 72 hours. CBG: No results for input(s): GLUCAP in the last 168 hours. Lipid Profile: No results for input(s): CHOL, HDL, LDLCALC, TRIG, CHOLHDL, LDLDIRECT in the last 72 hours. Thyroid Function Tests: No results for input(s): TSH, T4TOTAL, FREET4, T3FREE, THYROIDAB in the last 72 hours. Anemia Panel: Recent Labs    07/30/18 1412  FERRITIN 167   Sepsis Labs: Recent Labs  Lab 07/30/18 1221 07/30/18 1412  PROCALCITON  --  <0.10  LATICACIDVEN 1.5 3.7*    Recent Results (from the past 240 hour(s))  Culture, blood (routine x 2)     Status: None (Preliminary result)   Collection Time: 07/30/18 12:21 PM  Result Value Ref Range Status   Specimen Description BLOOD RIGHT ANTECUBITAL DRAWN BY RN  Final   Special Requests   Final    BOTTLES DRAWN AEROBIC AND ANAEROBIC Blood Culture adequate volume   Culture   Final  NO GROWTH < 24 HOURS Performed at [facility], [address] [place], [place] [address]    Report Status PENDING  Incomplete  Culture, blood (routine x 2)     Status: None (Preliminary result)   Collection Time: 07/30/18  1:00 PM  Result Value Ref Range Status   Specimen Description BLOOD LEFT ANTECUBITAL  Final   Special Requests   Final    BOTTLES DRAWN AEROBIC AND ANAEROBIC Blood Culture adequate volume   Culture   Final    NO GROWTH < 24 HOURS Performed at [facility], [address] [place], [place] [address]    Report Status PENDING  Incomplete  Respiratory Panel by PCR     Status: None   Collection Time: 07/30/18  2:16 PM  Result Value Ref Range Status   Adenovirus NOT DETECTED NOT DETECTED Final   Coronavirus 229E NOT DETECTED NOT DETECTED Final    Comment: (NOTE) The Coronavirus on the Respiratory Panel, DOES NOT test for the novel  Coronavirus (2019 nCoV)    Coronavirus HKU1 NOT DETECTED NOT DETECTED Final   Coronavirus NL63 NOT  DETECTED NOT DETECTED Final   Coronavirus OC43 NOT DETECTED NOT DETECTED Final   Metapneumovirus NOT DETECTED NOT DETECTED Final   Rhinovirus / Enterovirus NOT DETECTED NOT DETECTED Final   Influenza A NOT DETECTED NOT DETECTED Final   Influenza B NOT DETECTED NOT DETECTED Final   Parainfluenza Virus 1 NOT DETECTED NOT DETECTED Final   Parainfluenza Virus 2 NOT DETECTED NOT DETECTED Final   Parainfluenza Virus 3 NOT DETECTED NOT DETECTED Final   Parainfluenza Virus 4 NOT DETECTED NOT DETECTED Final   Respiratory Syncytial Virus NOT DETECTED NOT DETECTED Final   Bordetella pertussis NOT DETECTED NOT DETECTED Final   Chlamydophila pneumoniae NOT DETECTED NOT DETECTED Final   Mycoplasma pneumoniae NOT DETECTED NOT DETECTED Final    Comment: Performed at [facility], [place]. [address], [place], [place] [address]         Radiology Studies: Dg Chest Port 1 View  Result Date: 07/30/2018 CLINICAL DATA:  Sob and cough x 1 week. No fever at this time. EXAM: PORTABLE CHEST 1 VIEW COMPARISON:  01/09/2014 FINDINGS: There are bilateral interstitial and airspace opacities, with airspace opacity most prominent in the peripheral left lung. There are additional chronic areas of scarring in both upper lobes stable from the prior exam. No convincing pleural effusion.  No pneumothorax. Cardiac silhouette is normal in size. No mediastinal or hilar masses. Skeletal structures are grossly intact. IMPRESSION: 1. Bilateral, left greater than right, interstitial and airspace lung opacities consistent with multifocal pneumonia. Electronically Signed   By: Lajean Manes M.D.   On: 07/30/2018 12:55        Scheduled Meds: . amLODipine  2.5 mg Oral Daily  . atorvastatin  10 mg Oral QHS  . enoxaparin (LOVENOX) injection  40 mg Subcutaneous Q24H  . escitalopram  10 mg Oral Daily  . folic acid  1 mg Oral Daily  . hydroxychloroquine  200 mg Oral BID  . Ipratropium-Albuterol  1 puff Inhalation Q6H  .  latanoprost  1 drop Both Eyes QHS  . mometasone-formoterol  2 puff Inhalation BID  . pneumococcal 23 valent vaccine  0.5 mL Intramuscular Tomorrow-1000  . predniSONE  10 mg Oral Daily  . timolol  1 drop Both Eyes Daily  . valACYclovir  1,000 mg Oral TID  . vitamin C  500 mg Oral Daily  . zinc sulfate  220 mg  Oral Daily   Continuous Infusions: . sodium chloride Stopped (07/30/18 2311)  . sodium chloride 75 mL/hr at 07/30/18 2311  . azithromycin       LOS: 1 day     Georgette Shell, MD [facility]  If 7PM-7AM, please contact night-coverage [email or web address] Password [facility] 07/31/2018, 8:19 AM

## 2018-08-01 ENCOUNTER — Inpatient Hospital Stay (HOSPITAL_COMMUNITY): Payer: Medicare Other

## 2018-08-01 DIAGNOSIS — J1289 Other viral pneumonia: Secondary | ICD-10-CM

## 2018-08-01 LAB — C-REACTIVE PROTEIN: CRP: 7.9 mg/dL — ABNORMAL HIGH (ref <1.0)

## 2018-08-01 LAB — BLOOD GAS, ARTERIAL
Acid-base deficit: 3.4 mmol/L — ABNORMAL HIGH (ref 0.0–2.0)
Bicarbonate: 20.5 mmol/L (ref 20.0–28.0)
Drawn by: 270211
FIO2: 1
MECHVT: 380 mL
O2 Saturation: 99.9 %
PEEP: 15 cmH2O
Patient temperature: 98.6
RATE: 30 resp/min
pCO2 arterial: 34.5 mmHg (ref 32.0–48.0)
pH, Arterial: 7.391 (ref 7.350–7.450)
pO2, Arterial: 258 mmHg — ABNORMAL HIGH (ref 83.0–108.0)

## 2018-08-01 LAB — CBC WITH DIFFERENTIAL/PLATELET
Abs Immature Granulocytes: 0.12 10*3/uL — ABNORMAL HIGH (ref 0.00–0.07)
Basophils Absolute: 0 10*3/uL (ref 0.0–0.1)
Basophils Relative: 0 %
Eosinophils Absolute: 0.1 10*3/uL (ref 0.0–0.5)
Eosinophils Relative: 1 %
HCT: 33.1 % — ABNORMAL LOW (ref 39.0–52.0)
Hemoglobin: 10.5 g/dL — ABNORMAL LOW (ref 13.0–17.0)
Immature Granulocytes: 1 %
Lymphocytes Relative: 10 %
Lymphs Abs: 1.2 10*3/uL (ref 0.7–4.0)
MCH: 29.2 pg (ref 26.0–34.0)
MCHC: 31.7 g/dL (ref 30.0–36.0)
MCV: 91.9 fL (ref 80.0–100.0)
Monocytes Absolute: 0.9 10*3/uL (ref 0.1–1.0)
Monocytes Relative: 8 %
Neutro Abs: 9.3 10*3/uL — ABNORMAL HIGH (ref 1.7–7.7)
Neutrophils Relative %: 80 %
Platelets: 322 10*3/uL (ref 150–400)
RBC: 3.6 MIL/uL — ABNORMAL LOW (ref 4.22–5.81)
RDW: 16.1 % — ABNORMAL HIGH (ref 11.5–15.5)
WBC: 11.5 10*3/uL — ABNORMAL HIGH (ref 4.0–10.5)
nRBC: 0 % (ref 0.0–0.2)

## 2018-08-01 LAB — MAGNESIUM: Magnesium: 2.2 mg/dL (ref 1.7–2.4)

## 2018-08-01 LAB — COMPREHENSIVE METABOLIC PANEL
ALT: 20 U/L (ref 0–44)
AST: 13 U/L — ABNORMAL LOW (ref 15–41)
Albumin: 2.9 g/dL — ABNORMAL LOW (ref 3.5–5.0)
Alkaline Phosphatase: 47 U/L (ref 38–126)
Anion gap: 8 (ref 5–15)
BUN: 19 mg/dL (ref 8–23)
CO2: 20 mmol/L — ABNORMAL LOW (ref 22–32)
Calcium: 8.6 mg/dL — ABNORMAL LOW (ref 8.9–10.3)
Chloride: 108 mmol/L (ref 98–111)
Creatinine, Ser: 1.01 mg/dL (ref 0.61–1.24)
GFR calc Af Amer: >60 mL/min (ref >60)
GFR calc non Af Amer: >60 mL/min (ref >60)
Glucose, Bld: 108 mg/dL — ABNORMAL HIGH (ref 70–99)
Potassium: 3.7 mmol/L (ref 3.5–5.1)
Sodium: 136 mmol/L (ref 135–145)
Total Bilirubin: 0.6 mg/dL (ref 0.3–1.2)
Total Protein: 6.1 g/dL — ABNORMAL LOW (ref 6.5–8.1)

## 2018-08-01 LAB — LEGIONELLA PNEUMOPHILA SEROGP 1 UR AG: L. pneumophila Serogp 1 Ur Ag: NEGATIVE

## 2018-08-01 LAB — TRIGLYCERIDES: Triglycerides: 168 mg/dL — ABNORMAL HIGH (ref <150)

## 2018-08-01 LAB — CK: Total CK: 19 U/L — ABNORMAL LOW (ref 49–397)

## 2018-08-01 MED ORDER — ATORVASTATIN CALCIUM 10 MG PO TABS
10.0000 mg | ORAL_TABLET | Freq: Every day | ORAL | Status: DC
  Administered 2018-08-02 – 2018-08-14 (×13): 10 mg
  Filled 2018-08-01 (×14): qty 1

## 2018-08-01 MED ORDER — SODIUM CHLORIDE 0.9 % IV SOLN
INTRAVENOUS | Status: DC | PRN
  Administered 2018-08-01: 1000 mL via INTRAVENOUS
  Administered 2018-08-03: 22:00:00 250 mL via INTRAVENOUS
  Administered 2018-08-09: 08:00:00 50 mL via INTRAVENOUS

## 2018-08-01 MED ORDER — FENTANYL CITRATE (PF) 100 MCG/2ML IJ SOLN
50.0000 ug | Freq: Once | INTRAMUSCULAR | Status: DC
  Filled 2018-08-01: qty 2

## 2018-08-01 MED ORDER — CHLORHEXIDINE GLUCONATE 0.12% ORAL RINSE (MEDLINE KIT)
15.0000 mL | Freq: Two times a day (BID) | OROMUCOSAL | Status: DC
  Administered 2018-08-02 – 2018-08-14 (×20): 15 mL via OROMUCOSAL

## 2018-08-01 MED ORDER — ORAL CARE MOUTH RINSE
15.0000 mL | OROMUCOSAL | Status: DC
  Administered 2018-08-01 – 2018-08-03 (×15): 15 mL via OROMUCOSAL

## 2018-08-01 MED ORDER — ZINC SULFATE 220 (50 ZN) MG PO CAPS
220.0000 mg | ORAL_CAPSULE | Freq: Every day | ORAL | Status: DC
  Administered 2018-08-02 – 2018-08-03 (×2): 220 mg
  Filled 2018-08-01 (×2): qty 1

## 2018-08-01 MED ORDER — IPRATROPIUM-ALBUTEROL 0.5-2.5 (3) MG/3ML IN SOLN
3.0000 mL | Freq: Four times a day (QID) | RESPIRATORY_TRACT | Status: DC
  Administered 2018-08-01 – 2018-08-03 (×6): 3 mL via RESPIRATORY_TRACT
  Filled 2018-08-01 (×7): qty 3

## 2018-08-01 MED ORDER — FOLIC ACID 1 MG PO TABS
1.0000 mg | ORAL_TABLET | Freq: Every day | ORAL | Status: DC
  Administered 2018-08-02 – 2018-08-03 (×2): 1 mg
  Filled 2018-08-01 (×2): qty 1

## 2018-08-01 MED ORDER — MIDAZOLAM HCL 2 MG/2ML IJ SOLN
1.0000 mg | INTRAMUSCULAR | Status: DC | PRN
  Filled 2018-08-01 (×2): qty 2

## 2018-08-01 MED ORDER — FUROSEMIDE 10 MG/ML IJ SOLN
40.0000 mg | Freq: Once | INTRAMUSCULAR | Status: AC
  Administered 2018-08-01: 09:00:00 40 mg via INTRAVENOUS
  Filled 2018-08-01: qty 4

## 2018-08-01 MED ORDER — ACETAMINOPHEN 160 MG/5ML PO SOLN: 650.0000 mg | Freq: Four times a day (QID) | ORAL | Status: DC | PRN

## 2018-08-01 MED ORDER — NOREPINEPHRINE BITARTRATE 1 MG/ML IV SOLN
0.0000 ug/min | INTRAVENOUS | Status: DC
  Administered 2018-08-01: 2 ug/min via INTRAVENOUS
  Administered 2018-08-02: 5 ug/min via INTRAVENOUS
  Filled 2018-08-01 (×3): qty 4

## 2018-08-01 MED ORDER — SODIUM CHLORIDE 0.9% FLUSH: 10.0000 mL | INTRAVENOUS | Status: DC | PRN

## 2018-08-01 MED ORDER — MIDAZOLAM HCL 2 MG/2ML IJ SOLN
INTRAMUSCULAR | Status: AC
  Administered 2018-08-01: 16:00:00 2 mg
  Filled 2018-08-01: qty 2

## 2018-08-01 MED ORDER — CHLORHEXIDINE GLUCONATE 0.12% ORAL RINSE (MEDLINE KIT)
15.0000 mL | Freq: Two times a day (BID) | OROMUCOSAL | Status: DC
  Administered 2018-08-01 – 2018-08-03 (×4): 15 mL via OROMUCOSAL

## 2018-08-01 MED ORDER — FENTANYL CITRATE (PF) 100 MCG/2ML IJ SOLN: 200.0000 ug | Freq: Once | INTRAMUSCULAR | Status: DC

## 2018-08-01 MED ORDER — ETOMIDATE 2 MG/ML IV SOLN: 20.0000 mg | Freq: Once | INTRAVENOUS | Status: DC

## 2018-08-01 MED ORDER — ORAL CARE MOUTH RINSE: 15.0000 mL | OROMUCOSAL | Status: DC

## 2018-08-01 MED ORDER — PROPOFOL 1000 MG/100ML IV EMUL
INTRAVENOUS | Status: AC
  Filled 2018-08-01: qty 100

## 2018-08-01 MED ORDER — PROPOFOL 1000 MG/100ML IV EMUL
0.0000 ug/kg/min | INTRAVENOUS | Status: DC
  Administered 2018-08-01: 20 ug/kg/min via INTRAVENOUS
  Administered 2018-08-01: 18:00:00 5 ug/kg/min via INTRAVENOUS
  Filled 2018-08-01: qty 100

## 2018-08-01 MED ORDER — MIDAZOLAM HCL 2 MG/2ML IJ SOLN: 2.0000 mg | Freq: Once | INTRAMUSCULAR | Status: DC

## 2018-08-01 MED ORDER — VALACYCLOVIR HCL 500 MG PO TABS
1000.0000 mg | ORAL_TABLET | Freq: Three times a day (TID) | ORAL | Status: DC
  Administered 2018-08-02 – 2018-08-03 (×4): 1000 mg
  Filled 2018-08-01 (×4): qty 2
  Filled 2018-08-01: qty 1
  Filled 2018-08-01: qty 2

## 2018-08-01 MED ORDER — MIDAZOLAM HCL 2 MG/2ML IJ SOLN
1.0000 mg | INTRAMUSCULAR | Status: DC | PRN
  Administered 2018-08-02 (×2): 1 mg via INTRAVENOUS
  Filled 2018-08-01 (×2): qty 2

## 2018-08-01 MED ORDER — ORAL CARE MOUTH RINSE: 15.0000 mL | Freq: Two times a day (BID) | OROMUCOSAL | Status: DC

## 2018-08-01 MED ORDER — VITAMIN C 500 MG PO TABS
500.0000 mg | ORAL_TABLET | Freq: Two times a day (BID) | ORAL | Status: DC
  Administered 2018-08-02 – 2018-08-03 (×3): 500 mg
  Filled 2018-08-01 (×4): qty 1

## 2018-08-01 MED ORDER — FENTANYL BOLUS VIA INFUSION
25.0000 ug | INTRAVENOUS | Status: DC | PRN
  Administered 2018-08-01 – 2018-08-02 (×2): 25 ug via INTRAVENOUS
  Filled 2018-08-01: qty 25

## 2018-08-01 MED ORDER — VECURONIUM BROMIDE 10 MG IV SOLR: 60.0000 ug | Freq: Once | INTRAVENOUS | Status: DC

## 2018-08-01 MED ORDER — DOCUSATE SODIUM 50 MG/5ML PO LIQD: 100.0000 mg | Freq: Two times a day (BID) | ORAL | Status: DC | PRN

## 2018-08-01 MED ORDER — HYDROXYCHLOROQUINE SULFATE 200 MG PO TABS
200.0000 mg | ORAL_TABLET | Freq: Two times a day (BID) | ORAL | Status: AC
  Administered 2018-08-02 – 2018-08-04 (×5): 200 mg
  Filled 2018-08-01 (×6): qty 1

## 2018-08-01 MED ORDER — SODIUM CHLORIDE 0.9% FLUSH
10.0000 mL | Freq: Two times a day (BID) | INTRAVENOUS | Status: DC
  Administered 2018-08-01: 10 mL
  Administered 2018-08-02: 21:00:00 30 mL
  Administered 2018-08-03 – 2018-08-08 (×9): 10 mL
  Administered 2018-08-08: 30 mL
  Administered 2018-08-09 – 2018-08-13 (×10): 10 mL

## 2018-08-01 MED ORDER — FENTANYL 2500MCG IN NS 250ML (10MCG/ML) PREMIX INFUSION
25.0000 ug/h | INTRAVENOUS | Status: DC
  Administered 2018-08-01: 225 ug/h via INTRAVENOUS
  Administered 2018-08-02: 175 ug/h via INTRAVENOUS
  Filled 2018-08-01 (×3): qty 250

## 2018-08-01 MED ORDER — CHLORHEXIDINE GLUCONATE CLOTH 2 % EX PADS
6.0000 | MEDICATED_PAD | Freq: Every day | CUTANEOUS | Status: DC
  Administered 2018-08-02 – 2018-08-08 (×7): 6 via TOPICAL

## 2018-08-01 NOTE — Progress Notes (Signed)
PROGRESS NOTE    Joel Valdez  VOH:[id number] DOB: Aug 29, 1945 DOA: 07/30/2018 PCP: Joel Jeans, PA-C   Brief Narrative:73 y.o.malewith a past medical history significant for anxiety/depression, asthma, gastroesophageal reflux disease, hypertension, hyperlipidemia, history of prostate cancer (currently receiving Lupron) and rheumatoid arthritis (chronically on a steroids and methotrexate); who presented to the hospital secondary to general malaise, chills, shortness of breath and cough that have been present for 1 week now. Patient expressed no fever, no chest pain, no nausea, no vomiting, no hemoptysis, no melena, no hematochezia, no focal weakness or any sick contacts. She reported trying his best to adhere to quarentine process. Due to his ongoing symptoms he was evaluated through telemedicine by PCP and advised to come to the emergency department forCOVID-19 rule out and further evaluation and management.  Of note, he reported intermittent use of albuterol nebulizer at home; without improvement in symptoms. At time EMS arrived to his house and evaluated him O2 sat was 86% on RA.  In the ED chest x-ray demonstrated multifocal pneumonia with perihilar infiltrate; he has a temperature of 99.5,Respiratory rate of 26-28;elevated WBCsat 11.9;elevatedsed rate at 61, LDH 221;d-dimer 3.47 and mild elevation of troponin of 0.04.Influenza follow-up PCR was negative. And the rest of admitting blood work as per COVID-19 protocol pending.  Patient received IV fluids, started on antibiotics and cultures taken.TR[facility] contacted to admit patient for further evaluation and management.  Assessment & Plan:   Principal Problem:   Pneumonia due to COVID-19 virus Active Problems:   Glaucoma   Essential hypertension, benign   Prostate cancer ([place])   Depression   Rheumatoid arthritis involving both hands ([place])   Essential hypertension   #1 acute hypoxic respiratory failure presumed to be  secondary to COVID-19.  As of this morning this test is still pending.  Patient was on 6 L of oxygen until this morning when his oxygen requirement started increasing with periods of hypoxia in the 70s and his oxygen was increased to 10 L by nasal cannula.  Continue Plaquenil Rocephin and azithromycin.  His potassium is 3.7 magnesium 2.2 will replete the potassium to keep it above 4.  EKG QT 448 as of 07/31/2018.  Influenza panel negative.  Appreciate PCCM assistance.  #2 rheumatoid arthritis continue prednisone hold methotrexate  #3 hypertension patient's blood pressure has been soft Norvasc DC'd.  #4 depression continue Lexapro  #5 elevated troponin secondary to demand no cardiac changes.  #6 prostate cancer on Lupron  #7 glaucoma continue eyedrops   DVT prophylaxis: Lovenox  code Status:Full code Family Communication: Discussed with his wife Joel Valdez over the phone Disposition Plan:Anticipate discharge back home once medically stable. Consults called:PCCM    Estimated body mass index is 25.82 kg/m as calculated from the following:   Height as of this encounter: 5\' 6"  (1.676 m).   Weight as of this encounter: 72.6 kg.    Subjective:  Patient resting in bed continuously coughing when I saw him this morning with oxygen saturation dipping to the 70s on 5 L Objective: Vitals:   08/01/18 0912 08/01/18 1000 08/01/18 1100 08/01/18 1157  BP: (!) 127/57 (!) 114/57 127/73   Pulse: 67 87 91 86  Resp: (!) 25 (!) 22 (!) 38 (!) 26  Temp:      TempSrc:      SpO2: 100% 94% 93% 94%  Weight:      Height:        Intake/Output Summary (Last 24 hours) at 08/01/2018 1200 Last data filed  at 08/01/2018 1154 Gross per 24 hour  Intake 1542.59 ml  Output 2025 ml  Net -482.41 ml   Filed Weights   07/30/18 1210  Weight: 72.6 kg    Examination:  General exam: Appears calm and comfortable  Respiratory system: Wheezing to auscultation. Respiratory effort normal. Cardiovascular  system: S1 & S2 heard, RRR. No JVD, murmurs, rubs, gallops or clicks. No pedal edema. Gastrointestinal system: Abdomen is nondistended, soft and nontender. No organomegaly or masses felt. Normal bowel sounds heard. Central nervous system: Alert and oriented. No focal neurological deficits. Extremities: Symmetric 5 x 5 power. Skin: No rashes, lesions or ulcers Psychiatry: Judgement and insight appear normal. Mood & affect appropriate.     Data Reviewed: I have personally reviewed following labs and imaging studies  CBC: Recent Labs  Lab 07/30/18 1221 07/31/18 0500 08/01/18 0318  WBC 11.9* 9.2 11.5*  NEUTROABS 10.0* 6.9 9.3*  HGB 11.9* 11.0* 10.5*  HCT 35.9* 34.0* 33.1*  MCV 89.5 92.9 91.9  PLT 392 308 101   Basic Metabolic Panel: Recent Labs  Lab 07/30/18 1221 07/31/18 0500 08/01/18 0318  NA 133* 134* 136  K 4.3 4.4 3.7  CL 103 106 108  CO2 19* 19* 20*  GLUCOSE 103* 104* 108*  BUN 25* 24* 19  CREATININE 1.23 1.24 1.01  CALCIUM 8.7* 8.4* 8.6*  MG  --  2.1 2.2   GFR: Estimated Creatinine Clearance: 58.8 mL/min (by C-G formula based on SCr of 1.01 mg/dL). Liver Function Tests: Recent Labs  Lab 07/31/18 0500 08/01/18 0318  AST 16 13*  ALT 20 20  ALKPHOS 47 47  BILITOT 0.2* 0.6  PROT 5.9* 6.1*  ALBUMIN 2.6* 2.9*   No results for input(s): LIPASE, AMYLASE in the last 168 hours. No results for input(s): AMMONIA in the last 168 hours. Coagulation Profile: Recent Labs  Lab 07/30/18 1221  INR 1.1   Cardiac Enzymes: Recent Labs  Lab 07/30/18 1221 07/30/18 1412 07/31/18 0500 08/01/18 0318  CKTOTAL  --   --  28* 19*  TROPONINI 0.04* 0.04*  --   --    BNP (last 3 results) No results for input(s): PROBNP in the last 8760 hours. HbA1C: No results for input(s): HGBA1C in the last 72 hours. CBG: No results for input(s): GLUCAP in the last 168 hours. Lipid Profile: No results for input(s): CHOL, HDL, LDLCALC, TRIG, CHOLHDL, LDLDIRECT in the last 72 hours.  Thyroid Function Tests: No results for input(s): TSH, T4TOTAL, FREET4, T3FREE, THYROIDAB in the last 72 hours. Anemia Panel: Recent Labs    07/30/18 1412  FERRITIN 167   Sepsis Labs: Recent Labs  Lab 07/30/18 1221 07/30/18 1412  PROCALCITON  --  <0.10  LATICACIDVEN 1.5 3.7*    Recent Results (from the past 240 hour(s))  Culture, blood (routine x 2)     Status: None (Preliminary result)   Collection Time: 07/30/18 12:21 PM  Result Value Ref Range Status   Specimen Description BLOOD RIGHT ANTECUBITAL DRAWN BY RN  Final   Special Requests   Final    BOTTLES DRAWN AEROBIC AND ANAEROBIC Blood Culture adequate volume   Culture   Final    NO GROWTH 2 DAYS Performed at [facility], [address] [place],  [address]    Report Status PENDING  Incomplete  Urine culture     Status: None   Collection Time: 07/30/18 12:24 PM  Result Value Ref Range Status   Specimen Description   Final    URINE, RANDOM  Performed at [facility], [address], [place], [place] [address]    Special Requests   Final    NONE Performed at [facility], [address] [place], [place] [address]    Culture   Final    NO GROWTH Performed at [facility], [place] [address], [place], [place] [address]    Report Status 07/31/2018 FINAL  Final  Culture, blood (routine x 2)     Status: None (Preliminary result)   Collection Time: 07/30/18  1:00 PM  Result Value Ref Range Status   Specimen Description BLOOD LEFT ANTECUBITAL  Final   Special Requests   Final    BOTTLES DRAWN AEROBIC AND ANAEROBIC Blood Culture adequate volume   Culture   Final    NO GROWTH 2 DAYS Performed at [facility], [address], [place], [place] [address]    Report Status PENDING  Incomplete  Respiratory Panel by PCR     Status: None   Collection Time: 07/30/18  2:16 PM  Result Value Ref Range Status   Adenovirus NOT DETECTED NOT DETECTED Final   Coronavirus 229E NOT DETECTED NOT DETECTED Final    Comment:  (NOTE) The Coronavirus on the Respiratory Panel, DOES NOT test for the novel  Coronavirus (2019 nCoV)    Coronavirus HKU1 NOT DETECTED NOT DETECTED Final   Coronavirus NL63 NOT DETECTED NOT DETECTED Final   Coronavirus OC43 NOT DETECTED NOT DETECTED Final   Metapneumovirus NOT DETECTED NOT DETECTED Final   Rhinovirus / Enterovirus NOT DETECTED NOT DETECTED Final   Influenza A NOT DETECTED NOT DETECTED Final   Influenza B NOT DETECTED NOT DETECTED Final   Parainfluenza Virus 1 NOT DETECTED NOT DETECTED Final   Parainfluenza Virus 2 NOT DETECTED NOT DETECTED Final   Parainfluenza Virus 3 NOT DETECTED NOT DETECTED Final   Parainfluenza Virus 4 NOT DETECTED NOT DETECTED Final   Respiratory Syncytial Virus NOT DETECTED NOT DETECTED Final   Bordetella pertussis NOT DETECTED NOT DETECTED Final   Chlamydophila pneumoniae NOT DETECTED NOT DETECTED Final   Mycoplasma pneumoniae NOT DETECTED NOT DETECTED Final    Comment: Performed at [facility], [place] [address] [place], [place] [address]         Radiology Studies: Dg Chest Port 1 View  Result Date: 07/30/2018 CLINICAL DATA:  Sob and cough x 1 week. No fever at this time. EXAM: PORTABLE CHEST 1 VIEW COMPARISON:  01/09/2014 FINDINGS: There are bilateral interstitial and airspace opacities, with airspace opacity most prominent in the peripheral left lung. There are additional chronic areas of scarring in both upper lobes stable from the prior exam. No convincing pleural effusion.  No pneumothorax. Cardiac silhouette is normal in size. No mediastinal or hilar masses. Skeletal structures are grossly intact. IMPRESSION: 1. Bilateral, left greater than right, interstitial and airspace lung opacities consistent with multifocal pneumonia. Electronically Signed   By: Lajean Manes M.D.   On: 07/30/2018 12:55        Scheduled Meds: . atorvastatin  10 mg Oral QHS  . enoxaparin (LOVENOX) injection  40 mg Subcutaneous Q24H  . escitalopram  10  mg Oral Daily  . folic acid  1 mg Oral Daily  . hydroxychloroquine  200 mg Oral BID  . Ipratropium-Albuterol  1 puff Inhalation Q6H  . latanoprost  1 drop Both Eyes QHS  . mouth rinse  15 mL Mouth Rinse BID  . mometasone-formoterol  2 puff Inhalation BID  . predniSONE  10 mg Oral  Daily  . timolol  1 drop Both Eyes Daily  . valACYclovir  1,000 mg Oral TID  . vitamin C  500 mg Oral BID  . zinc sulfate  220 mg Oral Daily   Continuous Infusions: . sodium chloride 10 mL/hr at 08/01/18 1154  . azithromycin Stopped (07/31/18 1627)  . cefTRIAXone (ROCEPHIN)  IV Stopped (08/01/18 1016)     LOS: 2 days     Georgette Shell, MD [facility]  If 7PM-7AM, please contact night-coverage [email or web address] Password [email or web address] 08/01/2018, 12:00 PM

## 2018-08-01 NOTE — Procedures (Signed)
Central Venous Catheter Insertion Procedure Note Jermel L. Dehart [id number] 1946/03/25  Procedure: Insertion of Central Venous Catheter Indications: Assessment of intravascular volume and Drug and/or fluid administration  Procedure Details Consent: Risks of procedure as well as the alternatives and risks of each were explained to the (patient/caregiver).  Consent for procedure obtained. Time Out: Verified patient identification, verified procedure, site/side was marked, verified correct patient position, special equipment/implants available, medications/allergies/relevent history reviewed, required imaging and test results available.  Performed  Maximum sterile technique was used including antiseptics, cap, gloves, gown, hand hygiene, mask and sheet. Skin prep: Chlorhexidine; local anesthetic administered A antimicrobial bonded/coated triple lumen catheter was placed in the left internal jugular vein using the Seldinger technique.  Evaluation Blood flow good Complications: No apparent complications Patient did tolerate procedure well. Chest X-ray ordered to verify placement.  CXR: pending.  Gabrian Hoque 08/01/2018, 5:51 PM

## 2018-08-01 NOTE — Procedures (Signed)
Intubation Procedure Note Joel Valdez [id number] 11/03/1945  Procedure: Intubation Indications: Airway protection and maintenance  Procedure Details Consent: Risks of procedure as well as the alternatives and risks of each were explained to the (patient/caregiver).  Consent for procedure obtained. Time Out: Verified patient identification, verified procedure, site/side was marked, verified correct patient position, special equipment/implants available, medications/allergies/relevent history reviewed, required imaging and test results available.  Performed  Maximum sterile technique was used including cap, gloves, gown, hand hygiene and mask.  Glide scope, 1 attempt  Evaluation Hemodynamic Status: BP stable throughout; O2 sats: stable throughout Patient's Current Condition: stable Complications: No apparent complications Patient did tolerate procedure well. Chest X-ray ordered to verify placement.  CXR: pending.   Joel Valdez 08/01/2018

## 2018-08-01 NOTE — Progress Notes (Signed)
PCCM INTERVAL PROGRESS NOTE  Called the patient's wife [name] to inform her that Dequon had agreed to mechanical ventilation and had since been intubated. Also informed her of his decision to change code status to DNR in the event of cardiac arrest.   Georgann Housekeeper, AGACNP-BC [place] Pager [phone] or [phone]  08/01/2018 6:42 PM

## 2018-08-01 NOTE — Progress Notes (Signed)
In regards to daily EKG, reported to charge nurse for instruction for evaluation of QTc and QTintervals. Instructed to call tele monitoring for intervals. Spoke with tele monitoring personnel and requested intervals and strip attachment.

## 2018-08-01 NOTE — Progress Notes (Signed)
[facility] Progress Note Patient Name: Joel Valdez. Mian DOB: 1945-07-09 MRN: [id number]   Date of Service  08/01/2018  HPI/Events of Note  Notified of hypotension due to increasing sedation requirement  eICU Interventions   Ordered norepinephrine to maintain MAP >65     Intervention Category Major Interventions: Hypotension - evaluation and management  Judd Lien 08/01/2018, 11:32 PM

## 2018-08-01 NOTE — Progress Notes (Signed)
PCCM progress note  Patient lay prone for approximately 2 hours with good O2 sats however he refused to lay on his stomach anymore and turned supine Started to desat.  Decision made to intubate. Central line placed A-line attempted in radial but unable to thread wire.  Was able to place a line in the right femoral however it stopped measuring waveform after a couple of hours. As he has good blood pressure we will hold off on further attempts at A-line Follow chest x-ray, ABG.  CODE STATUS again discussed with patient before intubation.  He confirms okay to intubate but no CPR/defibrillation.  The patient is critically ill with multiple organ system failure and requires high complexity decision making for assessment and support, frequent evaluation and titration of therapies, advanced monitoring, review of radiographic studies and interpretation of complex data.   Additional ritical Care Time devoted to patient care services, exclusive of separately billable procedures, described in this note is 60 minutes.   Marshell Garfinkel MD [facility] Pager [phone] If no answer call 336 [phone] 08/01/2018, 5:48 PM

## 2018-08-01 NOTE — Procedures (Signed)
Chest Tube Insertion Procedure Note  Indications:  Clinically significant Pneumothorax  Pre-operative Diagnosis: Pneumothorax  Post-operative Diagnosis: Pneumothorax  Procedure Details  Informed consent was obtained for the procedure, including sedation.  Risks of lung perforation, hemorrhage, arrhythmia, and adverse drug reaction were discussed.   After sterile skin prep, using standard technique, a wayne catheter [place] tube was placed in the left mid axillary line  Estimated Blood Loss:  Minimal         Specimens:  None              Complications:  None; patient tolerated the procedure well.         Disposition: ICU - intubated and critically ill.         Condition: stable  Marshell Garfinkel MD [facility] 08/01/2018, 7:08 PM
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
## 2018-08-01 NOTE — Procedures (Signed)
Arterial Catheter Insertion Procedure Note Joel Valdez [id number] 02-Jul-1945  Procedure: Insertion of Arterial Catheter  Indications: Blood pressure monitoring and Frequent blood sampling  Procedure Details Consent: Risks of procedure as well as the alternatives and risks of each were explained to the (patient/caregiver).  Consent for procedure obtained. Time Out: Verified patient identification, verified procedure, site/side was marked, verified correct patient position, special equipment/implants available, medications/allergies/relevent history reviewed, required imaging and test results available.  Performed  Maximum sterile technique was used including antiseptics, cap, gloves, gown, hand hygiene, mask and sheet. Skin prep: Chlorhexidine; local anesthetic administered 24 gauge catheter was inserted into right femoral artery using the Seldinger technique. ULTRASOUND GUIDANCE USED: NO Evaluation Blood flow good; BP tracing good. Complications: No apparent complications.   Kaitlin Ardito 08/01/2018

## 2018-08-02 ENCOUNTER — Telehealth: Payer: Self-pay | Admitting: Physician Assistant

## 2018-08-02 ENCOUNTER — Telehealth: Payer: Self-pay

## 2018-08-02 DIAGNOSIS — R6521 Severe sepsis with septic shock: Secondary | ICD-10-CM

## 2018-08-02 DIAGNOSIS — A419 Sepsis, unspecified organism: Secondary | ICD-10-CM

## 2018-08-02 DIAGNOSIS — E876 Hypokalemia: Secondary | ICD-10-CM

## 2018-08-02 DIAGNOSIS — G9340 Encephalopathy, unspecified: Secondary | ICD-10-CM

## 2018-08-02 LAB — CBC WITH DIFFERENTIAL/PLATELET
Abs Immature Granulocytes: 0.22 10*3/uL — ABNORMAL HIGH (ref 0.00–0.07)
Basophils Absolute: 0.1 10*3/uL (ref 0.0–0.1)
Basophils Relative: 0 %
Eosinophils Absolute: 0.2 10*3/uL (ref 0.0–0.5)
Eosinophils Relative: 1 %
HCT: 35.8 % — ABNORMAL LOW (ref 39.0–52.0)
Hemoglobin: 11.3 g/dL — ABNORMAL LOW (ref 13.0–17.0)
Immature Granulocytes: 1 %
Lymphocytes Relative: 14 %
Lymphs Abs: 2.6 10*3/uL (ref 0.7–4.0)
MCH: 29.4 pg (ref 26.0–34.0)
MCHC: 31.6 g/dL (ref 30.0–36.0)
MCV: 93.2 fL (ref 80.0–100.0)
Monocytes Absolute: 1.6 10*3/uL — ABNORMAL HIGH (ref 0.1–1.0)
Monocytes Relative: 8 %
Neutro Abs: 14.3 10*3/uL — ABNORMAL HIGH (ref 1.7–7.7)
Neutrophils Relative %: 76 %
Platelets: 421 10*3/uL — ABNORMAL HIGH (ref 150–400)
RBC: 3.84 MIL/uL — ABNORMAL LOW (ref 4.22–5.81)
RDW: 16.3 % — ABNORMAL HIGH (ref 11.5–15.5)
WBC: 18.9 10*3/uL — ABNORMAL HIGH (ref 4.0–10.5)
nRBC: 0 % (ref 0.0–0.2)

## 2018-08-02 LAB — COMPREHENSIVE METABOLIC PANEL
ALT: 20 U/L (ref 0–44)
AST: 13 U/L — ABNORMAL LOW (ref 15–41)
Albumin: 3 g/dL — ABNORMAL LOW (ref 3.5–5.0)
Alkaline Phosphatase: 51 U/L (ref 38–126)
Anion gap: 11 (ref 5–15)
BUN: 26 mg/dL — ABNORMAL HIGH (ref 8–23)
CO2: 21 mmol/L — ABNORMAL LOW (ref 22–32)
Calcium: 8.8 mg/dL — ABNORMAL LOW (ref 8.9–10.3)
Chloride: 103 mmol/L (ref 98–111)
Creatinine, Ser: 1.48 mg/dL — ABNORMAL HIGH (ref 0.61–1.24)
GFR calc Af Amer: 54 mL/min — ABNORMAL LOW (ref >60)
GFR calc non Af Amer: 46 mL/min — ABNORMAL LOW (ref >60)
Glucose, Bld: 103 mg/dL — ABNORMAL HIGH (ref 70–99)
Potassium: 3.4 mmol/L — ABNORMAL LOW (ref 3.5–5.1)
Sodium: 135 mmol/L (ref 135–145)
Total Bilirubin: 0.6 mg/dL (ref 0.3–1.2)
Total Protein: 6.3 g/dL — ABNORMAL LOW (ref 6.5–8.1)

## 2018-08-02 LAB — GLUCOSE, CAPILLARY: Glucose-Capillary: 106 mg/dL — ABNORMAL HIGH (ref 70–99)

## 2018-08-02 LAB — CK: Total CK: 33 U/L — ABNORMAL LOW (ref 49–397)

## 2018-08-02 LAB — FERRITIN: Ferritin: 177 ng/mL (ref 24–336)

## 2018-08-02 LAB — NOVEL CORONAVIRUS, NAA (HOSP ORDER, SEND-OUT TO REF LAB; TAT 18-24 HRS): SARS-CoV-2, NAA: NOT DETECTED

## 2018-08-02 LAB — C-REACTIVE PROTEIN: CRP: 9.4 mg/dL — ABNORMAL HIGH (ref <1.0)

## 2018-08-02 LAB — LACTATE DEHYDROGENASE: LDH: 225 U/L — ABNORMAL HIGH (ref 98–192)

## 2018-08-02 LAB — MAGNESIUM: Magnesium: 2.1 mg/dL (ref 1.7–2.4)

## 2018-08-02 LAB — MRSA PCR SCREENING: MRSA by PCR: NEGATIVE

## 2018-08-02 MED ORDER — VITAL HIGH PROTEIN PO LIQD: 1000.0000 mL | ORAL | Status: DC

## 2018-08-02 MED ORDER — VITAL AF 1.2 CAL PO LIQD
1000.0000 mL | ORAL | Status: DC
  Administered 2018-08-02: 16:00:00 1000 mL

## 2018-08-02 MED ORDER — OXYCODONE HCL 5 MG/5ML PO SOLN
5.0000 mg | ORAL | Status: DC
  Administered 2018-08-02 – 2018-08-03 (×7): 5 mg
  Filled 2018-08-02 (×7): qty 5

## 2018-08-02 MED ORDER — POTASSIUM CHLORIDE 20 MEQ/15ML (10%) PO SOLN
40.0000 meq | Freq: Three times a day (TID) | ORAL | Status: AC
  Administered 2018-08-02 (×2): 40 meq
  Filled 2018-08-02 (×2): qty 30

## 2018-08-02 MED ORDER — FAMOTIDINE 40 MG/5ML PO SUSR
20.0000 mg | Freq: Every day | ORAL | Status: DC
  Administered 2018-08-03: 20 mg
  Filled 2018-08-02: qty 2.5

## 2018-08-02 NOTE — Progress Notes (Signed)
[facility] Progress Note Patient Name: Joel Valdez. Pallone DOB: [birth year] MRN: [id number]   Date of Service  08/02/2018  HPI/Events of Note  Patient oliguric with >450 cc on bladder scan  eICU Interventions  Foley catheter ordered to be inserted     Intervention Category Intermediate Interventions: Oliguria - evaluation and management  Judd Lien 08/02/2018, 2:27 AM

## 2018-08-02 NOTE — Telephone Encounter (Signed)
Spoke with patient's wife "Joel Valdez" to see how she and Joel Valdez are doing today as they were exposed to Joel Valdez via patient. She notes they are both still doing well. Afebrile and without any new onset respiratory symptoms or GI symptoms.  Verified with Joel Valdez that they have been contacted and updated by Hospitalist on Joel Valdez's status. Offered answers to any potential lingering questions. Reason for chest tube reiterated to them. Offered as much comfort as possible to them while remaining factual. Cell phone number given to Joel Valdez to contact me for any further questions or if I can be of emotional support in this time.   Leeanne Rio, PA-C

## 2018-08-02 NOTE — Progress Notes (Signed)
Pt Covid resutls came back negative. RN paged Infectious Disease Rayvon Char to see if we could discontinue the Covid precautions. He wanted to leave the patient on airborne and contact precautions due to still being on the ventilator and there is still no known cause of illness. RN notified ICU Multimedia programmer.

## 2018-08-02 NOTE — Progress Notes (Signed)
Initial Nutrition Assessment  RD working remotely.   DOCUMENTATION CODES:   Not applicable  INTERVENTION:  - if patient to remain intubated >/= 24 hours, recommend OGT placement. - TF recommendation: Vital AF 1.2 @ 60 ml/hr which with kcal from current propofol rate will provide 1785 kcal (105% estimated kcal need), 108 grams protein, and 1168 ml free water. - free water flush, if desired, to be per MD/NP.    NUTRITION DIAGNOSIS:   Inadequate oral intake related to inability to eat as evidenced by NPO status.  GOAL:   Patient will meet greater than or equal to 90% of their needs  MONITOR:   Vent status, Labs, Weight trends  REASON FOR ASSESSMENT:   Ventilator  ASSESSMENT:   73 y.o. male with a past medical history significant for anxiety/depression, asthma, GERD, HTN, hyperlipidemia, history of prostate cancer (currently receiving Lupron) and rheumatoid arthritis (chronically on a steroids and methotrexate). He presented to the ED due to general malaise, chills, SOB, and cough x1 week. Patient denied fever, chest pain, N/V, hemoptysis, melena, hematochezia, or focal weakness. He reported that he had been around a friend who was sick ~1 week ago. Due to ongoing symptoms he was evaluated through telemedicine by PCP and advised to go to the ED for COVID-19 rule out and further evaluation and management. In the ED, CXR demonstrated multifocal pneumonia with perihilar infiltrate and he had a temperature of 99.5 degrees. Influenza follow-up PCR was negative.  BMI indicates overweight; appropriate for age. Patient was admitted on 4/6 and intubated 4/8 at ~5:50 PM. Per review of LDA avatar and imaging, no OGT/NGT in place at this time. Per chart review, current weight is 160 lb and weight on 2/11 was 169 lb. This indicates 9 lb weight loss (5.3% body weight) in the past 2 months; not significant for time frame.   Per notes, acute respiratory failure with PNA and concern for COVID-19  (pending results), worsening respiratory distress with need of 5L O2 initially but requiring 10L on 4/8 so PCCM consulted. Patient subsequently intubated. He is a limited code: no CPR or defib.    Patient is currently intubated on ventilator support MV: 11.2 L/min as of 9:31 AM Temp (24hrs), Avg:98.1 F (36.7 C), Min:97.4 F (36.3 C), Max:98.7 F (37.1 C) Propofol: 2.18 ml/hr (57 kcal)  Medications reviewed; 1 mg folvite per tube/day, 10 mg deltasone/day, 500 mg ascorbic acid per tube BID, 220 mg zinc sulfate per tube/day.  Labs reviewed; K: 3.4 mmol/l, BUN: 26 mg/dl, creatinine: 1.48 mg/dl, Ca: 8.8 mg/dl, GFR: 46 ml/min.  Drips; fentanyl @ 175 mcg/hr, levo @ 4 mcg/min, propofol 2 5 mcg/kg/min.     NUTRITION - FOCUSED PHYSICAL EXAM:  Unable to assess at this time.   Diet Order:   Diet Order            Diet NPO time specified  Diet effective now              EDUCATION NEEDS:   No education needs have been identified at this time  Skin:  Skin Assessment: Reviewed RN Assessment  Last BM:  4/8  Height:   Ht Readings from Last 1 Encounters:  08/01/18 5\' 6"  (1.676 m)    Weight:   Wt Readings from Last 1 Encounters:  07/30/18 72.6 kg    Ideal Body Weight:  64.54 kg  BMI:  Body mass index is 25.82 kg/m.  Estimated Nutritional Needs:   Kcal:  1693 kcal  Protein:  87-109  grams  Fluid:  >/= 1.8 L/day     Jarome Matin, MS, RD, LDN, [facility] Inpatient Clinical Dietitian Pager # [phone] After hours/weekend pager # [phone]

## 2018-08-02 NOTE — Telephone Encounter (Signed)
Copied from [place] [id number]. Topic: General - Inquiry >> Aug 02, 2018  1:01 PM Vernona Rieger wrote: Reason for CRM: Patient's sister, [name] called and said he is currently in the hospital and she wants to talk with Elyn Aquas, [place]

## 2018-08-02 NOTE — Consult Note (Signed)
NAME:  Joel Valdez. Dines, MRN:  [id number], DOB:  1945-11-09, LOS: 3 ADMISSION DATE:  07/30/2018, CONSULTATION DATE:  08/01/2018 REFERRING MD: Kathreen Cosier MD  , CHIEF COMPLAINT: Acute hypoxic respiratory failure  Brief History   73 year old with anxiety/depression, asthma, rheumatoid arthritis chronically on steroids and methotrexate.  Presenting with cough, shortness of breath.  Sent to the ED for further evaluation.  COVID 19 pending.  On morning of 4/8 O2 requirements escalated from 5 L O2 to 10 L.  PCCM called for evaluation  Past Medical History  Anxiety/depression, asthma, gastroesophageal reflux disease, hypertension, hyperlipidemia, history of prostate cancer (currently receiving Lupron) and rheumatoid arthritis (chronically on a steroids and methotrexate);   Significant Hospital Events   4/8- Admit  Consults:  4/8-PCCM  Procedures:    Significant Diagnostic Tests:    Micro Data:    Antimicrobials:  Ceftriaxone 4/7 > Azithromycin 4/7>   Interim history/subjective:  No events overnight, no new complaints  Objective   Blood pressure (!) 101/57, pulse (!) 57, temperature (!) 97.4 F (36.3 C), temperature source Axillary, resp. rate (!) 30, height 5\' 6"  (1.676 m), weight 72.6 kg, SpO2 92 %.    Vent Mode: PRVC FiO2 (%):  [30 %-100 %] 30 % Set Rate:  [18 bmp-30 bmp] 30 bmp Vt Set:  [380 mL] 380 mL PEEP:  [8 cmH20-15 cmH20] 8 cmH20 Plateau Pressure:  [20 cmH20-26 cmH20] 20 cmH20   Intake/Output Summary (Last 24 hours) at 08/02/2018 1131 Last data filed at 08/02/2018 0900 Gross per 24 hour  Intake 712.36 ml  Output 945 ml  Net -232.64 ml   Filed Weights   07/30/18 1210  Weight: 72.6 kg    Examination: Gen:      Sedate, not following commands HEENT:  [place]/AT, PERRL, EOM-spontaneous and ETT in place Neck:     Supple, -LAN Lungs:    Coarse BS diffusely CV:         RRR, Nl S1/S2 and -M/R/G Abd:      Soft, NT, ND and +BS Ext:    -edema and -tenderness Skin:       Warm  and dry; no rash Neuro:    Arousable, sedate, moving all ext to command Psych:   Sedate, unable to assess   I reviewed CXR myself, ETT is in a good position, infiltrate noted  Resolved Hospital Problem list     Assessment & Plan:  73 year old with multiple medical comorbidities, immunosuppressed on steroids and methotrexate.  Presenting with acute respiratory failure bilateral infiltrates.    Respiratory failure Maintain on full vent support Decreased FiO2 to 30 and PEEP to 8 May begin weaning once at 30 and 5 Monitor closely for intubation needs We will start anti IL5 if COVID-19 test is positive Continue ceftriaxone, azithromycin, Plaquenil until COVID-19 test is back Hold further diureses for now given hemodynamics  Rheumatoid arthritis Continue baseline prednisone On methotrexate as an outpatient, hold for now  Goals of care Discussed with patient who was alert and fully able to participate He would like intubation if needed No CPR/defib in case of cardiac arrest.   Hypokalemia: Replace K BMET in AM  FEN: Nutrition to begin TF  No family to update  Best practice:  Diet: PO Pain/Anxiety/Delirium protocol (if indicated): NA VAP protocol (if indicated): NA DVT prophylaxis: Lovenox GI prophylaxis: NA Glucose control: Monitor sugars Mobility: Bed Code Status: Limited code. No CPR/Defib Family Communication: Patient updated Disposition: ICU  Labs   CBC: Recent Labs  Lab  07/30/18 1221 07/31/18 0500 08/01/18 0318 08/02/18 0350  WBC 11.9* 9.2 11.5* 18.9*  NEUTROABS 10.0* 6.9 9.3* 14.3*  HGB 11.9* 11.0* 10.5* 11.3*  HCT 35.9* 34.0* 33.1* 35.8*  MCV 89.5 92.9 91.9 93.2  PLT 392 308 322 240*   Basic Metabolic Panel: Recent Labs  Lab 07/30/18 1221 07/31/18 0500 08/01/18 0318 08/02/18 0350  NA 133* 134* 136 135  K 4.3 4.4 3.7 3.4*  CL 103 106 108 103  CO2 19* 19* 20* 21*  GLUCOSE 103* 104* 108* 103*  BUN 25* 24* 19 26*  CREATININE 1.23 1.24 1.01  1.48*  CALCIUM 8.7* 8.4* 8.6* 8.8*  MG  --  2.1 2.2 2.1   GFR: Estimated Creatinine Clearance: 40.1 mL/min (A) (by C-G formula based on SCr of 1.48 mg/dL (H)). Recent Labs  Lab 07/30/18 1221 07/30/18 1412 07/31/18 0500 08/01/18 0318 08/02/18 0350  PROCALCITON  --  <0.10  --   --   --   WBC 11.9*  --  9.2 11.5* 18.9*  LATICACIDVEN 1.5 3.7*  --   --   --     Liver Function Tests: Recent Labs  Lab 07/31/18 0500 08/01/18 0318 08/02/18 0350  AST 16 13* 13*  ALT 20 20 20   ALKPHOS 47 47 51  BILITOT 0.2* 0.6 0.6  PROT 5.9* 6.1* 6.3*  ALBUMIN 2.6* 2.9* 3.0*   No results for input(s): LIPASE, AMYLASE in the last 168 hours. No results for input(s): AMMONIA in the last 168 hours.  ABG    Component Value Date/Time   PHART 7.391 08/01/2018 1725   PCO2ART 34.5 08/01/2018 1725   PO2ART 258 (H) 08/01/2018 1725   HCO3 20.5 08/01/2018 1725   ACIDBASEDEF 3.4 (H) 08/01/2018 1725   O2SAT 99.9 08/01/2018 1725     Coagulation Profile: Recent Labs  Lab 07/30/18 1221  INR 1.1    Cardiac Enzymes: Recent Labs  Lab 07/30/18 1221 07/30/18 1412 07/31/18 0500 08/01/18 0318 08/02/18 0350  CKTOTAL  --   --  28* 19* 33*  TROPONINI 0.04* 0.04*  --   --   --     HbA1C: Hgb A1c MFr Bld  Date/Time Value Ref Range Status  04/10/2017 09:41 AM 5.8 4.6 - 6.5 % Final    Comment:    Glycemic Control Guidelines for People with Diabetes:Non Diabetic:  <6%Goal of Therapy: <7%Additional Action Suggested:  >8%     CBG: No results for input(s): GLUCAP in the last 168 hours.  The patient is critically ill with multiple organ systems failure and requires high complexity decision making for assessment and support, frequent evaluation and titration of therapies, application of advanced monitoring technologies and extensive interpretation of multiple databases.   Critical Care Time devoted to patient care services described in this note is  32  Minutes. This time reflects time of care of this  signee Dr Jennet Maduro. This critical care time does not reflect procedure time, or teaching time or supervisory time of PA/NP/Med student/Med Resident etc but could involve care discussion time.  Rush Farmer, M.D. [facility] Pulmonary/Critical Care Medicine. Pager: [phone]. After hours pager: [phone].

## 2018-08-02 NOTE — Progress Notes (Signed)
NUTRITION NOTE RD working remotely.  Consult received for TF initiation and management. Patient seen remotely by this RD earlier today. Will order TF per recommendation provided in that note:   - TF recommendation: Vital AF 1.2 @ 60 ml/hr which with kcal from current propofol rate will provide 1785 kcal (105% estimated kcal need), 108 grams protein, and 1168 ml free water. - free water flush, if desired, to be per MD/NP.    Estimated Nutritional Needs:  Kcal:  1693 kcal Protein:  87-109 grams Fluid:  >/= 1.8 L/day   Jarome Matin, MS, RD, LDN, [facility] Inpatient Clinical Dietitian Pager # [phone] After hours/weekend pager # [phone]

## 2018-08-02 NOTE — Telephone Encounter (Signed)
Called and spoke with patient's sister, [name] to answer questions.  Cell number given as office will be closed until Monday.

## 2018-08-03 ENCOUNTER — Inpatient Hospital Stay (HOSPITAL_COMMUNITY): Payer: Medicare Other

## 2018-08-03 LAB — COMPREHENSIVE METABOLIC PANEL
ALT: 9 U/L (ref 0–44)
AST: 21 U/L (ref 15–41)
Albumin: 2.5 g/dL — ABNORMAL LOW (ref 3.5–5.0)
Alkaline Phosphatase: 40 U/L (ref 38–126)
Anion gap: 6 (ref 5–15)
BUN: 31 mg/dL — ABNORMAL HIGH (ref 8–23)
CO2: 23 mmol/L (ref 22–32)
Calcium: 8.4 mg/dL — ABNORMAL LOW (ref 8.9–10.3)
Chloride: 108 mmol/L (ref 98–111)
Creatinine, Ser: 1.19 mg/dL (ref 0.61–1.24)
GFR calc Af Amer: >60 mL/min (ref >60)
GFR calc non Af Amer: >60 mL/min (ref >60)
Glucose, Bld: 93 mg/dL (ref 70–99)
Potassium: 5.8 mmol/L — ABNORMAL HIGH (ref 3.5–5.1)
Sodium: 137 mmol/L (ref 135–145)
Total Bilirubin: 1.3 mg/dL — ABNORMAL HIGH (ref 0.3–1.2)
Total Protein: 5.7 g/dL — ABNORMAL LOW (ref 6.5–8.1)

## 2018-08-03 LAB — BLOOD GAS, ARTERIAL
Acid-base deficit: 1.8 mmol/L (ref 0.0–2.0)
Bicarbonate: 23.7 mmol/L (ref 20.0–28.0)
Drawn by: 232811
FIO2: 40
MECHVT: 380 mL
O2 Saturation: 98.1 %
PEEP: 8 cmH2O
Patient temperature: 97.8
RATE: 30 resp/min
pCO2 arterial: 45.2 mmHg (ref 32.0–48.0)
pH, Arterial: 7.337 — ABNORMAL LOW (ref 7.350–7.450)
pO2, Arterial: 105 mmHg (ref 83.0–108.0)

## 2018-08-03 LAB — C-REACTIVE PROTEIN: CRP: 12.1 mg/dL — ABNORMAL HIGH (ref <1.0)

## 2018-08-03 LAB — CBC WITH DIFFERENTIAL/PLATELET
Abs Immature Granulocytes: 0.07 10*3/uL (ref 0.00–0.07)
Basophils Absolute: 0.1 10*3/uL (ref 0.0–0.1)
Basophils Relative: 1 %
Eosinophils Absolute: 0.5 10*3/uL (ref 0.0–0.5)
Eosinophils Relative: 4 %
HCT: 31.6 % — ABNORMAL LOW (ref 39.0–52.0)
Hemoglobin: 9.8 g/dL — ABNORMAL LOW (ref 13.0–17.0)
Immature Granulocytes: 1 %
Lymphocytes Relative: 10 %
Lymphs Abs: 1.2 10*3/uL (ref 0.7–4.0)
MCH: 29.5 pg (ref 26.0–34.0)
MCHC: 31 g/dL (ref 30.0–36.0)
MCV: 95.2 fL (ref 80.0–100.0)
Monocytes Absolute: 1.3 10*3/uL — ABNORMAL HIGH (ref 0.1–1.0)
Monocytes Relative: 11 %
Neutro Abs: 9 10*3/uL — ABNORMAL HIGH (ref 1.7–7.7)
Neutrophils Relative %: 73 %
Platelets: 302 10*3/uL (ref 150–400)
RBC: 3.32 MIL/uL — ABNORMAL LOW (ref 4.22–5.81)
RDW: 16.5 % — ABNORMAL HIGH (ref 11.5–15.5)
WBC: 12 10*3/uL — ABNORMAL HIGH (ref 4.0–10.5)
nRBC: 0 % (ref 0.0–0.2)

## 2018-08-03 LAB — MAGNESIUM: Magnesium: 2.3 mg/dL (ref 1.7–2.4)

## 2018-08-03 LAB — GLUCOSE, CAPILLARY
Glucose-Capillary: 106 mg/dL — ABNORMAL HIGH (ref 70–99)
Glucose-Capillary: 124 mg/dL — ABNORMAL HIGH (ref 70–99)
Glucose-Capillary: 131 mg/dL — ABNORMAL HIGH (ref 70–99)
Glucose-Capillary: 144 mg/dL — ABNORMAL HIGH (ref 70–99)
Glucose-Capillary: 98 mg/dL (ref 70–99)

## 2018-08-03 LAB — PHOSPHORUS: Phosphorus: 3.1 mg/dL (ref 2.5–4.6)

## 2018-08-03 LAB — CK: Total CK: 94 U/L (ref 49–397)

## 2018-08-03 MED ORDER — IPRATROPIUM-ALBUTEROL 0.5-2.5 (3) MG/3ML IN SOLN: 3.0000 mL | Freq: Four times a day (QID) | RESPIRATORY_TRACT | Status: DC

## 2018-08-03 MED ORDER — FOLIC ACID 1 MG PO TABS
1.0000 mg | ORAL_TABLET | Freq: Every day | ORAL | Status: DC
  Administered 2018-08-04 – 2018-08-15 (×12): 1 mg via ORAL
  Filled 2018-08-03 (×12): qty 1

## 2018-08-03 MED ORDER — ACETAMINOPHEN 160 MG/5ML PO SOLN
650.0000 mg | Freq: Four times a day (QID) | ORAL | Status: DC | PRN
  Administered 2018-08-04 – 2018-08-07 (×4): 650 mg via ORAL
  Filled 2018-08-03 (×5): qty 20.3

## 2018-08-03 MED ORDER — VITAMIN C 500 MG PO TABS
500.0000 mg | ORAL_TABLET | Freq: Two times a day (BID) | ORAL | Status: DC
  Administered 2018-08-03 – 2018-08-15 (×24): 500 mg via ORAL
  Filled 2018-08-03 (×24): qty 1

## 2018-08-03 MED ORDER — IPRATROPIUM-ALBUTEROL 20-100 MCG/ACT IN AERS
2.0000 | INHALATION_SPRAY | Freq: Four times a day (QID) | RESPIRATORY_TRACT | Status: DC
  Administered 2018-08-03 – 2018-08-10 (×29): 2 via RESPIRATORY_TRACT
  Filled 2018-08-03: qty 4

## 2018-08-03 MED ORDER — IPRATROPIUM-ALBUTEROL 20-100 MCG/ACT IN AERS
2.0000 | INHALATION_SPRAY | Freq: Four times a day (QID) | RESPIRATORY_TRACT | Status: DC
  Filled 2018-08-03: qty 4

## 2018-08-03 MED ORDER — FAMOTIDINE 40 MG/5ML PO SUSR
20.0000 mg | Freq: Every day | ORAL | Status: DC
  Administered 2018-08-04 – 2018-08-06 (×3): 20 mg via ORAL
  Filled 2018-08-03 (×3): qty 2.5

## 2018-08-03 MED ORDER — ZINC SULFATE 220 (50 ZN) MG PO CAPS
220.0000 mg | ORAL_CAPSULE | Freq: Every day | ORAL | Status: DC
  Administered 2018-08-04 – 2018-08-15 (×12): 220 mg via ORAL
  Filled 2018-08-03 (×12): qty 1

## 2018-08-03 MED ORDER — ORAL CARE MOUTH RINSE
15.0000 mL | Freq: Two times a day (BID) | OROMUCOSAL | Status: DC
  Administered 2018-08-04 – 2018-08-14 (×16): 15 mL via OROMUCOSAL

## 2018-08-03 NOTE — Progress Notes (Signed)
Wasted 50cc fentanyl gtt in sink with Willis Modena, RN.

## 2018-08-03 NOTE — Progress Notes (Signed)
SLP Cancellation Note  Patient Details Name: Joel Valdez. Joel Valdez MRN: [id number] DOB: 07-30-1945   Cancelled treatment:    Per discussion with Richardson Landry Minor, PPCM, SLP swallow evaluation is not warranted.  Orders were D/Cd.  Daimion Adamcik L. Tivis Ringer, [place] CCC/SLP Acute Rehabilitation Services Office number [phone] Pager [phone]        Juan Quam Laurice 08/03/2018, 1:18 PM

## 2018-08-03 NOTE — Progress Notes (Signed)
NAME:  Joel Valdez, MRN:  [id number], DOB:  [birth year], LOS: 4 ADMISSION DATE:  07/30/2018, CONSULTATION DATE:  08/01/2018 REFERRING MD: Kathreen Cosier MD  , CHIEF COMPLAINT: Acute hypoxic respiratory failure  Brief History   73 year old with anxiety/depression, asthma, rheumatoid arthritis chronically on steroids and methotrexate.  Presenting with cough, shortness of breath.  Sent to the ED for further evaluation.  COVID 19 pending.  On morning of 4/8 O2 requirements escalated from 5 L O2 to 10 L.  PCCM called for evaluation  Past Medical History  Anxiety/depression, asthma, gastroesophageal reflux disease, hypertension, hyperlipidemia, history of prostate cancer (currently receiving Lupron) and rheumatoid arthritis (chronically on a steroids and methotrexate);   Significant Hospital Events   4/8- Admit  Consults:  4/8-PCCM  Procedures:  4/8 et>>4/10 4/8 lijcvl>> 4/8 left ct>>  Significant Diagnostic Tests:    Micro Data:   07/30/2018 COVID-19 negative although ID is concerned that may be a false negative therefore continue isolation and airborne precautions Antimicrobials:  Ceftriaxone 4/7 > Azithromycin 4/7>   Interim history/subjective:  Plan for extubation on 08/02/72  Objective   Blood pressure (!) 114/56, pulse 87, temperature (!) 97.5 F (36.4 C), temperature source Oral, resp. rate (!) 26, height 5\' 6"  (1.676 m), weight 69.7 kg, SpO2 96 %.    Vent Mode: PRVC FiO2 (%):  [30 %-40 %] 30 % Set Rate:  [30 bmp] 30 bmp Vt Set:  [380 mL] 380 mL PEEP:  [8 cmH20] 8 cmH20 Plateau Pressure:  [17 cmH20-19 cmH20] 19 cmH20   Intake/Output Summary (Last 24 hours) at 08/03/2018 1104 Last data filed at 08/03/2018 1100 Gross per 24 hour  Intake 2320.33 ml  Output 765 ml  Net 1555.33 ml   Filed Weights   07/30/18 1210 08/03/18 0500  Weight: 72.6 kg 69.7 kg    Examination: General: Elderly male no acute distress HEENT: Endotracheal tube and gastric tube in place Neuro:  Follows commands moves all extremities nods heads interacts appropriately  CV: s1s2 rrr, no m/r/g PULM: even/non-labored, lungs bilaterally decreased in the bases, left [place] pneumothorax tube in place without any overt indications of air leak  ZT:IWPY, non-tender, bsx4 active  Extremities: warm/dry, negative edema  Skin: no rashes or lesions   73 no chest x-ray  Resolved Hospital Problem list     Assessment & Plan:  73 year old with multiple medical comorbidities, immunosuppressed on steroids and methotrexate.  Presenting with acute respiratory failure bilateral infiltrates.    Respiratory failure 08/03/2018 meets criteria for extubation He is COVID-19 negative Continue oxygen and bronchodilators as needed Continue ceftriaxone and Zithromax and will DC Plaquenil Once extubated and stabilized to go out of the unit. Transfer his care to the [facility] effective 08/04/2018 Holding diuresis on 08/02/72 due to soft blood pressures  Left PNX  Plan Monitor serial chest x-ray Monitor chest tube for next 24 hours if no air leak will discontinue on 08/04/2018 Pulmonary critical care will continue to follow for chest tube until resolved.   Rheumatoid arthritis Continue baseline prednisone Will restart regular trach site near future  Goals of care No CPR or defibrillation in case of cardiac arrest.   Hypokalemia: Recent Labs  Lab 08/01/18 0318 08/02/18 0350 08/03/18 0500  K 3.7 3.4* 5.8*    Now noted to be hyperkalemic 08/03/2018 questionable's hemolytic Monitor potassium No further potassium supplements at this time No Lasix at this time due to marginal blood pressure   FEN:  08/03/2018 discontinue tube feedings due to the fact  he is to be extubated Regular diet  No family to update  Best practice:  Diet: PO Pain/Anxiety/Delirium protocol (if indicated): NA VAP protocol (if indicated): NA DVT prophylaxis: Lovenox GI prophylaxis: NA Glucose control:  Monitor sugars Mobility: Bed Code Status: Limited code. No CPR/Defib Family Communication: 08/03/2018 patient updated at bedside Disposition: ICU  Labs   CBC: Recent Labs  Lab 07/30/18 1221 07/31/18 0500 08/01/18 0318 08/02/18 0350 08/03/18 0500  WBC 11.9* 9.2 11.5* 18.9* 12.0*  NEUTROABS 10.0* 6.9 9.3* 14.3* 9.0*  HGB 11.9* 11.0* 10.5* 11.3* 9.8*  HCT 35.9* 34.0* 33.1* 35.8* 31.6*  MCV 89.5 92.9 91.9 93.2 95.2  PLT 392 308 322 421* 093   Basic Metabolic Panel: Recent Labs  Lab 07/30/18 1221 07/31/18 0500 08/01/18 0318 08/02/18 0350 08/03/18 0500  NA 133* 134* 136 135 137  K 4.3 4.4 3.7 3.4* 5.8*  CL 103 106 108 103 108  CO2 19* 19* 20* 21* 23  GLUCOSE 103* 104* 108* 103* 93  BUN 25* 24* 19 26* 31*  CREATININE 1.23 1.24 1.01 1.48* 1.19  CALCIUM 8.7* 8.4* 8.6* 8.8* 8.4*  MG  --  2.1 2.2 2.1 2.3  PHOS  --   --   --   --  3.1   GFR: Estimated Creatinine Clearance: 49.9 mL/min (by C-G formula based on SCr of 1.19 mg/dL). Recent Labs  Lab 07/30/18 1221 07/30/18 1412 07/31/18 0500 08/01/18 0318 08/02/18 0350 08/03/18 0500  PROCALCITON  --  <0.10  --   --   --   --   WBC 11.9*  --  9.2 11.5* 18.9* 12.0*  LATICACIDVEN 1.5 3.7*  --   --   --   --     Liver Function Tests: Recent Labs  Lab 07/31/18 0500 08/01/18 0318 08/02/18 0350 08/03/18 0500  AST 16 13* 13* 21  ALT 20 20 20 9   ALKPHOS 47 47 51 40  BILITOT 0.2* 0.6 0.6 1.3*  PROT 5.9* 6.1* 6.3* 5.7*  ALBUMIN 2.6* 2.9* 3.0* 2.5*   No results for input(s): LIPASE, AMYLASE in the last 168 hours. No results for input(s): AMMONIA in the last 168 hours.  ABG    Component Value Date/Time   PHART 7.337 (L) 08/03/2018 0300   PCO2ART 45.2 08/03/2018 0300   PO2ART 105 08/03/2018 0300   HCO3 23.7 08/03/2018 0300   ACIDBASEDEF 1.8 08/03/2018 0300   O2SAT 98.1 08/03/2018 0300     Coagulation Profile: Recent Labs  Lab 07/30/18 1221  INR 1.1    Cardiac Enzymes: Recent Labs  Lab 07/30/18 1221  07/30/18 1412 07/31/18 0500 08/01/18 0318 08/02/18 0350 08/03/18 0500  CKTOTAL  --   --  28* 19* 33* 94  TROPONINI 0.04* 0.04*  --   --   --   --     HbA1C: Hgb A1c MFr Bld  Date/Time Value Ref Range Status  04/10/2017 09:41 AM 5.8 4.6 - 6.5 % Final    Comment:    Glycemic Control Guidelines for People with Diabetes:Non Diabetic:  <6%Goal of Therapy: <7%Additional Action Suggested:  >8%     CBG: Recent Labs  Lab 08/02/18 1522 08/02/18 2124 08/03/18 0032 08/03/18 0520 08/03/18 0747  GLUCAP 106* 131* 144* 98 106*    App cct 45 min   Richardson Landry Minor ACNP Maryanna Shape PCCM Pager [phone] till 1 pm If no answer page [phone] 08/03/2018, 11:04 AM

## 2018-08-03 NOTE — Plan of Care (Signed)
Pt intubated and sedated. Unable to assess if education has been successful.

## 2018-08-03 NOTE — Procedures (Signed)
Extubation Procedure Note  Patient Details:   Name: Joel Valdez. Koon DOB: [birth year] MRN: [id number]   Airway Documentation:    Vent end date: 08/03/18 Vent end time: 1130   Evaluation  O2 sats: stable throughout Complications: No apparent complications Patient did tolerate procedure well. Bilateral Breath Sounds: Diminished   Yes  Gonzella Lex 08/03/2018, 11:41 AM

## 2018-08-03 NOTE — Evaluation (Signed)
Physical Therapy Evaluation Patient Details Name: Joel Valdez MRN: [id number] DOB: 1946/03/18 Today's Date: 08/03/2018   History of Present Illness  Pt admitted 2* SOB and cough and intubated 07/31/08 and extubated 08/02/08.  Pt  with hx of RA, asthma, anxiety/depression, and deafness R ear  Clinical Impression  Pt admitted as above and presenting with functional mobility limitations 2* generalized weakness and ambulatory balance deficits.  Pt should progress to dc home with family assist.    Follow Up Recommendations No PT follow up    Equipment Recommendations  None recommended by PT    Recommendations for Other Services       Precautions / Restrictions Precautions Precautions: Fall Precaution Comments: Chest tube in place Restrictions Weight Bearing Restrictions: No      Mobility  Bed Mobility Overal bed mobility: Needs Assistance Bed Mobility: Supine to Sit;Sit to Supine     Supine to sit: Min assist Sit to supine: Min assist      Transfers Overall transfer level: Needs assistance Equipment used: Rolling walker (2 wheeled) Transfers: Sit to/from Stand Sit to Stand: Min assist;+2 safety/equipment         General transfer comment: cues for safe transition position and min assist for balance  Ambulation/Gait Ambulation/Gait assistance: Min assist;+2 safety/equipment Gait Distance (Feet): 75 Feet Assistive device: Rolling walker (2 wheeled) Gait Pattern/deviations: Step-through pattern;Decreased step length - right;Decreased step length - left;Shuffle;Trunk flexed Gait velocity: decr   General Gait Details: cues for posture and position from RW; min assist for balance  Stairs            Wheelchair Mobility    Modified Rankin (Stroke Patients Only)       Balance Overall balance assessment: Needs assistance Sitting-balance support: No upper extremity supported;Feet supported Sitting balance-Leahy Scale: Fair     Standing balance support:  Bilateral upper extremity supported Standing balance-Leahy Scale: Poor                               Pertinent Vitals/Pain Pain Assessment: No/denies pain    Home Living Family/patient expects to be discharged to:: Private residence Living Arrangements: Spouse/significant other;Other relatives Available Help at Discharge: Family Type of Home: House Home Access: Stairs to enter   [employer] of Steps: 3 Home Layout: One level Home Equipment: [place] - 2 wheels;Cane - single point      Prior Function Level of Independence: Independent               Hand Dominance        Extremity/Trunk Assessment   Upper Extremity Assessment Upper Extremity Assessment: Generalized weakness    Lower Extremity Assessment Lower Extremity Assessment: Generalized weakness       Communication   Communication: HOH  Cognition Arousal/Alertness: Awake/alert Behavior During Therapy: WFL for tasks assessed/performed Overall Cognitive Status: Within Functional Limits for tasks assessed                                        General Comments      Exercises     Assessment/Plan    PT Assessment Patient needs continued PT services  PT Problem List Decreased strength;Decreased activity tolerance;Decreased balance;Decreased mobility;Decreased knowledge of use of DME       PT Treatment Interventions DME instruction;Gait training;Stair training;Functional mobility training;Therapeutic activities;Therapeutic exercise;Patient/family education    PT Goals (  Current goals can be found in the Care Plan section)  Acute Rehab PT Goals Patient Stated Goal: I need to walk PT Goal Formulation: With patient Time For Goal Achievement: 08/17/18 Potential to Achieve Goals: Good    Frequency Min 3X/week   Barriers to discharge        Co-evaluation               AM-PAC PT "6 Clicks" Mobility  Outcome Measure Help needed turning from your back to  your side while in a flat bed without using bedrails?: None Help needed moving from lying on your back to sitting on the side of a flat bed without using bedrails?: A Little Help needed moving to and from a bed to a chair (including a wheelchair)?: A Little Help needed standing up from a chair using your arms (e.g., wheelchair or bedside chair)?: A Little Help needed to walk in hospital room?: A Little Help needed climbing 3-5 steps with a railing? : A Lot 6 Click Score: 18    End of Session Equipment Utilized During Treatment: Gait belt;Oxygen Activity Tolerance: Patient tolerated treatment well Patient left: in bed;with call bell/phone within reach;with bed alarm set Nurse Communication: Mobility status PT Visit Diagnosis: Difficulty in walking, not elsewhere classified (R26.2);Muscle weakness (generalized) (M62.81)    Time: 3546-5681 PT Time Calculation (min) (ACUTE ONLY): 50 min   Charges:   PT Evaluation $PT Eval Low Complexity: 1 Low PT Treatments $Gait Training: 8-22 mins        [place] Pager [phone] Office [phone]   Evana Runnels 08/03/2018, 5:28 PM

## 2018-08-04 ENCOUNTER — Inpatient Hospital Stay (HOSPITAL_COMMUNITY): Payer: Medicare Other

## 2018-08-04 DIAGNOSIS — J939 Pneumothorax, unspecified: Secondary | ICD-10-CM

## 2018-08-04 DIAGNOSIS — J189 Pneumonia, unspecified organism: Principal | ICD-10-CM

## 2018-08-04 LAB — CULTURE, BLOOD (ROUTINE X 2)
Culture: NO GROWTH
Culture: NO GROWTH
Special Requests: ADEQUATE
Special Requests: ADEQUATE

## 2018-08-04 LAB — CBC WITH DIFFERENTIAL/PLATELET
Abs Immature Granulocytes: 0.09 10*3/uL — ABNORMAL HIGH (ref 0.00–0.07)
Basophils Absolute: 0.1 10*3/uL (ref 0.0–0.1)
Basophils Relative: 0 %
Eosinophils Absolute: 0.3 10*3/uL (ref 0.0–0.5)
Eosinophils Relative: 2 %
HCT: 31.7 % — ABNORMAL LOW (ref 39.0–52.0)
Hemoglobin: 9.8 g/dL — ABNORMAL LOW (ref 13.0–17.0)
Immature Granulocytes: 1 %
Lymphocytes Relative: 14 %
Lymphs Abs: 1.8 10*3/uL (ref 0.7–4.0)
MCH: 29.1 pg (ref 26.0–34.0)
MCHC: 30.9 g/dL (ref 30.0–36.0)
MCV: 94.1 fL (ref 80.0–100.0)
Monocytes Absolute: 1.2 10*3/uL — ABNORMAL HIGH (ref 0.1–1.0)
Monocytes Relative: 9 %
Neutro Abs: 9.2 10*3/uL — ABNORMAL HIGH (ref 1.7–7.7)
Neutrophils Relative %: 74 %
Platelets: 313 10*3/uL (ref 150–400)
RBC: 3.37 MIL/uL — ABNORMAL LOW (ref 4.22–5.81)
RDW: 15.9 % — ABNORMAL HIGH (ref 11.5–15.5)
WBC: 12.7 10*3/uL — ABNORMAL HIGH (ref 4.0–10.5)
nRBC: 0 % (ref 0.0–0.2)

## 2018-08-04 LAB — COMPREHENSIVE METABOLIC PANEL
ALT: 15 U/L (ref 0–44)
AST: 14 U/L — ABNORMAL LOW (ref 15–41)
Albumin: 2.8 g/dL — ABNORMAL LOW (ref 3.5–5.0)
Alkaline Phosphatase: 43 U/L (ref 38–126)
Anion gap: 9 (ref 5–15)
BUN: 25 mg/dL — ABNORMAL HIGH (ref 8–23)
CO2: 22 mmol/L (ref 22–32)
Calcium: 8.8 mg/dL — ABNORMAL LOW (ref 8.9–10.3)
Chloride: 104 mmol/L (ref 98–111)
Creatinine, Ser: 1.05 mg/dL (ref 0.61–1.24)
GFR calc Af Amer: >60 mL/min (ref >60)
GFR calc non Af Amer: >60 mL/min (ref >60)
Glucose, Bld: 90 mg/dL (ref 70–99)
Potassium: 3.8 mmol/L (ref 3.5–5.1)
Sodium: 135 mmol/L (ref 135–145)
Total Bilirubin: 0.4 mg/dL (ref 0.3–1.2)
Total Protein: 6.1 g/dL — ABNORMAL LOW (ref 6.5–8.1)

## 2018-08-04 LAB — C-REACTIVE PROTEIN: CRP: 10 mg/dL — ABNORMAL HIGH (ref <1.0)

## 2018-08-04 LAB — MAGNESIUM: Magnesium: 2.1 mg/dL (ref 1.7–2.4)

## 2018-08-04 LAB — CK: Total CK: 56 U/L (ref 49–397)

## 2018-08-04 LAB — PHOSPHORUS: Phosphorus: 2.8 mg/dL (ref 2.5–4.6)

## 2018-08-04 MED ORDER — TRAMADOL HCL 50 MG PO TABS
50.0000 mg | ORAL_TABLET | Freq: Four times a day (QID) | ORAL | Status: DC | PRN
  Administered 2018-08-04 – 2018-08-15 (×19): 50 mg via ORAL
  Filled 2018-08-04 (×19): qty 1

## 2018-08-04 NOTE — Progress Notes (Signed)
Dr. Kyung Bacca notified of need for stronger pain meds as well chest tube being removed by critical care team. Pt stable an placed on high flow nasal cannula by bedside nurse Kim per md order. Pt sating 96 percent on 14l high flow nasal cannula.

## 2018-08-04 NOTE — Progress Notes (Signed)
Pt de-sat to 70's. New monitoring equipment applied, wave form good, pt stated he did not feel short of breath. Changed to non rebreather, sats increased to 100, placed back on [place] 4L and sats back in the 70's. Pt having thick sputum, new suction set up, decongestant given as well as inhaler. Chest tube tubing seemed to have a clot. Milked tubing until able to free, manipulating tubing to clear fluid. Pt improved.  Placed back on 4L [place] and waited in room 10 minutes and he remained stable in the upper 90's. Will leave on [place] and continue to monitor. Hoyle Barr, RN

## 2018-08-04 NOTE — Progress Notes (Addendum)
Pt stable overall though did get short of breath with exertion with physical therapy and occupational therapy. Pt did desat to 84 percent on 15l of high flow [place] and became pale. Pt placed back on non rebreather due to this shortness of breath overall. Pt 02 sat rose back up to 100 percent and breathing pattern improved overall.

## 2018-08-04 NOTE — Progress Notes (Signed)
Pt wife updated on plan of care an overall pt condition. No changes needed to overall plan of care. Pt remains up on chair.

## 2018-08-04 NOTE — Plan of Care (Signed)
  Problem: Clinical Measurements: Goal: Will remain free from infection Outcome: Progressing   Problem: Clinical Measurements: Goal: Diagnostic test results will improve Outcome: Progressing   Problem: Clinical Measurements: Goal: Respiratory complications will improve Outcome: Progressing   Problem: Clinical Measurements: Goal: Cardiovascular complication will be avoided Outcome: Progressing   Problem: Activity: Goal: Risk for activity intolerance will decrease Outcome: Progressing   Problem: Nutrition: Goal: Adequate nutrition will be maintained Outcome: Progressing

## 2018-08-04 NOTE — Evaluation (Signed)
Occupational Therapy Evaluation Patient Details Name: Joel Valdez. Macomber MRN: [id number] DOB: 14-Feb-1946 Today's Date: 08/04/2018    History of Present Illness Pt admitted 2* SOB with acute respiratory failure and intubated 07/31/08 and extubated 08/02/08.  Pt  with hx of RA, asthma, anxiety/depression, and deafness R ear   Clinical Impression   This 73 year old man was admitted for the above.  At baseline, he is independent with adls.  He needs extensive assistance for LB adls and transfers at this time due to pain and cardiopulmonary system.  Will follow in acute setting with the goals listed below.  Do not anticipate he will need follow up OT after acute stay; will update if he doesn't progress as quickly as anticipated   Follow Up Recommendations  Supervision/Assistance - 24 hour    Equipment Recommendations  (to be further assessed:  has high commode; tub)    Recommendations for Other Services       Precautions / Restrictions Precautions Precautions: Fall Precaution Comments: CT removed Restrictions Weight Bearing Restrictions: No      Mobility Bed Mobility               General bed mobility comments: oob  Transfers   Equipment used: 1 person hand held assist   Sit to Stand: Min assist         General transfer comment: assist to steady    Balance                                           ADL either performed or assessed with clinical judgement   ADL Overall ADL's : Needs assistance/impaired Eating/Feeding: Independent   Grooming: Oral care;Minimal assistance   Upper Body Bathing: Minimal assistance   Lower Body Bathing: Maximal assistance   Upper Body Dressing : Moderate assistance   Lower Body Dressing: Total assistance                 General ADL Comments: stood with min A twice; sats down to 80-81% with standing second time     Vision         Perception     Praxis      Pertinent Vitals/Pain Pain Assessment:  Faces Faces Pain Scale: Hurts little more Pain Location: bil shoulders and L side Pain Descriptors / Indicators: Aching Pain Intervention(s): Limited activity within patient's tolerance;Monitored during session;Repositioned;RN gave pain meds during session     Hand Dominance     Extremity/Trunk Assessment Upper Extremity Assessment Upper Extremity Assessment: Generalized weakness;RUE deficits/detail RUE Deficits / Details: R shoulder limited by pain.  Only moving 20 FF AROM; tolerated to 79 AAROM           Communication Communication Communication: HOH   Cognition Arousal/Alertness: Awake/alert Behavior During Therapy: WFL for tasks assessed/performed Overall Cognitive Status: Within Functional Limits for tasks assessed                                     General Comments  fatiques quickly; very motivated    Exercises Exercises: (instructed in shoulder retraction)   Shoulder Instructions      Home Living Family/patient expects to be discharged to:: Private residence Living Arrangements: Spouse/significant other;Other relatives Available Help at Discharge: Family  Bathroom Shower/Tub: Teacher, early years/pre: Handicapped height     Home Equipment: Grab bars - tub/shower;Walker - 2 wheels;Cane - single point          Prior Functioning/Environment Level of Independence: Independent                 OT Problem List: Decreased strength;Decreased activity tolerance;Impaired balance (sitting and/or standing);Decreased knowledge of use of DME or AE;Cardiopulmonary status limiting activity;Pain      OT Treatment/Interventions: Self-care/ADL training;Therapeutic exercise;Energy conservation;Therapeutic activities;DME and/or AE instruction;Patient/family education;Balance training    OT Goals(Current goals can be found in the care plan section) Acute Rehab OT Goals Patient Stated Goal: I need to walk OT Goal  Formulation: With patient Time For Goal Achievement: 08/18/18 Potential to Achieve Goals: Good ADL Goals Pt Will Transfer to Toilet: with min guard assist;ambulating(high commode) Pt Will Perform Tub/Shower Transfer: Tub transfer;with min guard assist;ambulating(with dme as needed) Additional ADL Goal #1: pt will initiate at least one rest break during adls for energy conservation and need only SU for UB/min A for LB Additional ADL Goal #2: pt will be independent with AAROM exercise program for RUE/level one theraband for LUE  OT Frequency: Min 2X/week   Barriers to D/C:            Co-evaluation              AM-PAC OT "6 Clicks" Daily Activity     Outcome Measure Help from another person eating meals?: None Help from another person taking care of personal grooming?: A Little Help from another person toileting, which includes using toliet, bedpan, or urinal?: A Lot Help from another person bathing (including washing, rinsing, drying)?: A Lot Help from another person to put on and taking off regular upper body clothing?: A Lot Help from another person to put on and taking off regular lower body clothing?: Total 6 Click Score: 14   End of Session    Activity Tolerance: Patient tolerated treatment well Patient left: in chair;with call bell/phone within reach;with nursing/sitter in room  OT Visit Diagnosis: Unsteadiness on feet (R26.81);Muscle weakness (generalized) (M62.81)                Time: 8372-9021 OT Time Calculation (min): 25 min Charges:  OT General Charges $OT Visit: 1 Visit OT Evaluation $OT Eval Moderate Complexity: 1 Mod OT Treatments $Self Care/Home Management : 8-22 mins  Lesle Chris, OTR/L Acute Rehabilitation Services [phone] [facility] pager [phone] office 08/04/2018  [place] 08/04/2018, 4:07 PM

## 2018-08-04 NOTE — Progress Notes (Signed)
NAME:  Joel Valdez. Ybanez, MRN:  [id number], DOB:  25-Feb-1946, LOS: 5 ADMISSION DATE:  07/30/2018, CONSULTATION DATE:  08/01/2018 REFERRING MD: Kathreen Cosier MD  , CHIEF COMPLAINT: Acute hypoxic respiratory failure  Brief History   73 year old with anxiety/depression, asthma, rheumatoid arthritis chronically on steroids and methotrexate.  Presenting with cough, shortness of breath.  Sent to the ED for further evaluation.  COVID 19 pending. Admitted with [facility].   On 4/8 patient with increased oxygen requirements and respiratory distress. PCCM asked to evaluate. Transferred to ICU for intubation. Post-Central Line placement with Left Pneumothorax. Chest Tube placed.   Past Medical History  Anxiety/depression, asthma, gastroesophageal reflux disease, hypertension, hyperlipidemia, history of prostate cancer (currently receiving Lupron) and rheumatoid arthritis (chronically on a steroids and methotrexate);   Significant Hospital Events   4/8- Admit  Consults:  4/8-PCCM  Procedures:  4/8 et > 4/10 4/8 lijcvl >>  4/8 left ct > 4/11   Significant Diagnostic Tests:    Micro Data:  07/30/2018 COVID-19 negative although ID is concerned that may be a false negative therefore continue isolation and airborne precautions  Antimicrobials:  Ceftriaxone 4/7 > Azithromycin 4/7>   Interim history/subjective:  No events overnight. This AM with periods of hypoxia without distress.   Objective   Blood pressure 122/62, pulse 69, temperature (!) 97.5 F (36.4 C), temperature source Axillary, resp. rate 17, height 5\' 6"  (1.676 m), weight 58.2 kg, SpO2 99 %.    FiO2 (%):  [100 %] 100 %   Intake/Output Summary (Last 24 hours) at 08/04/2018 1415 Last data filed at 08/04/2018 1349 Gross per 24 hour  Intake 1738.74 ml  Output 2325 ml  Net -586.26 ml   Filed Weights   07/30/18 1210 08/03/18 0500 08/04/18 0317  Weight: 72.6 kg 69.7 kg 58.2 kg    Examination: General: Elderly male, no distress, sitting in  bed  HEENT: Dry MM  Neuro: Alert, oriented, follows commands  CV: RRR, no MRG  PULM: Diminished breath sounds, no wheeze/crackles, left chest tube in place   GI: soft, non-tender, active bowel sounds Extremities: -edema  Skin: warm, dry   Resolved Hospital Problem list     Assessment & Plan:  73 year old with multiple medical comorbidities, immunosuppressed on steroids and methotrexate.  Presenting with acute respiratory failure bilateral infiltrates.    Left Pneumothorax  Plan Chest Tube without air-leak No Recurrence on CXR  Monitor serial chest x-ray D/C Chest Tube   PCCM will sign off at this time  Labs   CBC: Recent Labs  Lab 07/31/18 0500 08/01/18 0318 08/02/18 0350 08/03/18 0500 08/04/18 0305  WBC 9.2 11.5* 18.9* 12.0* 12.7*  NEUTROABS 6.9 9.3* 14.3* 9.0* 9.2*  HGB 11.0* 10.5* 11.3* 9.8* 9.8*  HCT 34.0* 33.1* 35.8* 31.6* 31.7*  MCV 92.9 91.9 93.2 95.2 94.1  PLT 308 322 421* 302 248   Basic Metabolic Panel: Recent Labs  Lab 07/31/18 0500 08/01/18 0318 08/02/18 0350 08/03/18 0500 08/04/18 0305  NA 134* 136 135 137 135  K 4.4 3.7 3.4* 5.8* 3.8  CL 106 108 103 108 104  CO2 19* 20* 21* 23 22  GLUCOSE 104* 108* 103* 93 90  BUN 24* 19 26* 31* 25*  CREATININE 1.24 1.01 1.48* 1.19 1.05  CALCIUM 8.4* 8.6* 8.8* 8.4* 8.8*  MG 2.1 2.2 2.1 2.3 2.1  PHOS  --   --   --  3.1 2.8   GFR: Estimated Creatinine Clearance: 51.6 mL/min (by C-G formula based on SCr of 1.05  mg/dL). Recent Labs  Lab 07/30/18 1221 07/30/18 1412  08/01/18 0318 08/02/18 0350 08/03/18 0500 08/04/18 0305  PROCALCITON  --  <0.10  --   --   --   --   --   WBC 11.9*  --    < > 11.5* 18.9* 12.0* 12.7*  LATICACIDVEN 1.5 3.7*  --   --   --   --   --    < > = values in this interval not displayed.    Liver Function Tests: Recent Labs  Lab 07/31/18 0500 08/01/18 0318 08/02/18 0350 08/03/18 0500 08/04/18 0305  AST 16 13* 13* 21 14*  ALT 20 20 20 9 15   ALKPHOS 47 47 51 40 43   BILITOT 0.2* 0.6 0.6 1.3* 0.4  PROT 5.9* 6.1* 6.3* 5.7* 6.1*  ALBUMIN 2.6* 2.9* 3.0* 2.5* 2.8*   No results for input(s): LIPASE, AMYLASE in the last 168 hours. No results for input(s): AMMONIA in the last 168 hours.  ABG    Component Value Date/Time   PHART 7.337 (L) 08/03/2018 0300   PCO2ART 45.2 08/03/2018 0300   PO2ART 105 08/03/2018 0300   HCO3 23.7 08/03/2018 0300   ACIDBASEDEF 1.8 08/03/2018 0300   O2SAT 98.1 08/03/2018 0300     Coagulation Profile: Recent Labs  Lab 07/30/18 1221  INR 1.1    Cardiac Enzymes: Recent Labs  Lab 07/30/18 1221 07/30/18 1412 07/31/18 0500 08/01/18 0318 08/02/18 0350 08/03/18 0500 08/04/18 0305  CKTOTAL  --   --  28* 19* 33* 94 56  TROPONINI 0.04* 0.04*  --   --   --   --   --     HbA1C: Hgb A1c MFr Bld  Date/Time Value Ref Range Status  04/10/2017 09:41 AM 5.8 4.6 - 6.5 % Final    Comment:    Glycemic Control Guidelines for People with Diabetes:Non Diabetic:  <6%Goal of Therapy: <7%Additional Action Suggested:  >8%     CBG: Recent Labs  Lab 08/02/18 2124 08/03/18 0032 08/03/18 0520 08/03/18 0747 08/03/18 1107  GLUCAP 131* 144* 98 106* 124*     Hayden Pedro, AGACNP-BC [facility]  PCCM Pgr: [phone]

## 2018-08-04 NOTE — Progress Notes (Signed)
Physical Therapy Treatment Patient Details Name: Joel Valdez. Joel Valdez MRN: [id number] DOB: 05-Mar-1946 Today's Date: 08/04/2018    History of Present Illness Pt admitted 2* SOB with acute respiratory failure and intubated 07/31/08 and extubated 08/02/08.  Pt  with hx of RA, asthma, anxiety/depression, and deafness R ear    PT Comments    Pt continues motivated but limited this date by ease of fatigue and increased SOB with exertion - pt on 12l HFNC.   Follow Up Recommendations  No PT follow up     Equipment Recommendations  None recommended by PT    Recommendations for Other Services       Precautions / Restrictions Precautions Precautions: Fall Precaution Comments: CT removed Restrictions Weight Bearing Restrictions: No    Mobility  Bed Mobility Overal bed mobility: Needs Assistance Bed Mobility: Supine to Sit     Supine to sit: Min assist     General bed mobility comments: oob  Transfers Overall transfer level: Needs assistance Equipment used: Rolling walker (2 wheeled) Transfers: Sit to/from Stand Sit to Stand: Min assist         General transfer comment: assist to steady  Ambulation/Gait Ambulation/Gait assistance: Min assist;+2 safety/equipment Gait Distance (Feet): 26 Feet Assistive device: Rolling walker (2 wheeled) Gait Pattern/deviations: Step-through pattern;Decreased step length - right;Decreased step length - left;Shuffle;Trunk flexed Gait velocity: decr   General Gait Details: cues for posture and position from RW; min assist for balance   Stairs             Wheelchair Mobility    Modified Rankin (Stroke Patients Only)       Balance Overall balance assessment: Needs assistance Sitting-balance support: No upper extremity supported;Feet supported Sitting balance-Leahy Scale: Fair     Standing balance support: Bilateral upper extremity supported Standing balance-Leahy Scale: Poor                              Cognition  Arousal/Alertness: Awake/alert Behavior During Therapy: WFL for tasks assessed/performed Overall Cognitive Status: Within Functional Limits for tasks assessed                                        Exercises General Exercises - Lower Extremity Ankle Circles/Pumps: AROM;Both;15 reps;Supine    General Comments General comments (skin integrity, edema, etc.): fatigues quickly      Pertinent Vitals/Pain Pain Assessment: Faces Faces Pain Scale: Hurts little more Pain Location: bil shoulders and L side Pain Descriptors / Indicators: Aching Pain Intervention(s): Limited activity within patient's tolerance;Monitored during session    Home Living Family/patient expects to be discharged to:: Private residence Living Arrangements: Spouse/significant other;Other relatives Available Help at Discharge: Family         Home Equipment: Grab bars - tub/shower;Walker - 2 wheels;Cane - single point      Prior Function Level of Independence: Independent          PT Goals (current goals can now be found in the care plan section) Acute Rehab PT Goals Patient Stated Goal: I need to walk PT Goal Formulation: With patient Time For Goal Achievement: 08/17/18 Potential to Achieve Goals: Good Progress towards PT goals: Not progressing toward goals - comment(increased fatigue and SOB with exertion)    Frequency    Min 3X/week      PT Plan Current plan remains appropriate  Co-evaluation              AM-PAC PT "6 Clicks" Mobility   Outcome Measure  Help needed turning from your back to your side while in a flat bed without using bedrails?: None Help needed moving from lying on your back to sitting on the side of a flat bed without using bedrails?: A Little Help needed moving to and from a bed to a chair (including a wheelchair)?: A Little Help needed standing up from a chair using your arms (e.g., wheelchair or bedside chair)?: A Little Help needed to walk in  hospital room?: A Little Help needed climbing 3-5 steps with a railing? : A Lot 6 Click Score: 18    End of Session Equipment Utilized During Treatment: Gait belt;Oxygen Activity Tolerance: Patient limited by fatigue Patient left: in chair;with call bell/phone within reach;with chair alarm set Nurse Communication: Mobility status PT Visit Diagnosis: Difficulty in walking, not elsewhere classified (R26.2);Muscle weakness (generalized) (M62.81)     Time: 8144-8185 PT Time Calculation (min) (ACUTE ONLY): 35 min  Charges:  $Gait Training: 8-22 mins $Therapeutic Activity: 8-22 mins                     [place] Pager [phone] Office [phone]    Angla Delahunt 08/04/2018, 4:28 PM

## 2018-08-04 NOTE — Progress Notes (Addendum)
PROGRESS NOTE  Joel Valdez KGY:[id number] DOB: 25-Jun-1945 DOA: 07/30/2018 PCP: [name], PA-C  HPI/Recap of past 41 hours: 73 year old male with multiple comorbidities and immunosuppression which includes prostate cancer rheumatoid arthritis, chronic steroid and methotrexate therapy, gastroesophageal reflux essential hypertension glaucoma prostate cancer, currently receiving Lupron injection, depression.  Who was admitted with respiratory failure with bilateral infiltrate been ruled out for COVID-19 pneumonia status post intubation extubated August 03, 2018   subjective: Patient seen and examined at intensive care unit.  Complaining of back pain.  Wants something stronger for pain relief .nurse reported: Pt stable overall though did get short of breath with exertion with physical therapy and occupational therapy. Pt did desat to 84 percent on 15l of high flow [place] and became pale. Pt placed back on non rebreather due to this shortness of breath overall. Pt 02 sat rose back up to 100 percent and breathing pattern improved overall.   Full PPE donned and doffed as stipulated by CDC and health department  Assessment/Plan: Principal Problem:   Pneumonia due to COVID-19 virus Active Problems:   Glaucoma   Essential hypertension, benign   Prostate cancer ([place])   Depression   Rheumatoid arthritis involving both hands ([place])   Essential hypertension  #1 acute toxic on chronic respiratory failure status post intubation.  Patient currently on high flow oxygen  2.  Rheumatoid arthritis we will continue his prednisone his methotrexate is on hold for now.  3.  Hypokalemia that has been replaced  4.  Pneumonia on identified etiology, with ruled out his COVID-19 is negative but this could be false negative we will continue ceftriaxone azithromycin he has completed his Plaquenil we will continue his ZINC for 2 weeks and vitamin C.  Infectious disease will need to reevaluate to see if this was a  false negative COVID-19  5.  Left pneumothorax resolved his chest tube was removed we will follow-up on the x-ray  6.  History of prostate cancer Lupron is on hold for right now  7.  Low back pain.  Patient complains of pain is not controlled with Tylenol.  So I will add tramadol as needed.  Addendum:Dr. Tommy Medal called about pt negative Covid test. He feels pt needs to stay on precautions due to pt overall condition and presentation. He does not want to retest pt    Code Status: Partial code: Patient would like to have intubation but no CPR /defibrillation in case of  cardiac arrest  Severity of Illness: The appropriate patient status for this patient is INPATIENT. Inpatient status is judged to be reasonable and necessary in order to provide the required intensity of service to ensure the patient's safety. The patient's presenting symptoms, physical exam findings, and initial radiographic and laboratory data in the context of their chronic comorbidities is felt to place them at high risk for further clinical deterioration. Furthermore, it is not anticipated that the patient will be medically stable for discharge from the hospital within 2 midnights of admission. The following factors support the patient status of inpatient.    Respiratory distress requiring high flow oxygen status post intubation and stable at this time still.   * I certify that at the point of admission it is my clinical judgment that the patient will require inpatient hospital care spanning beyond 2 midnights from the point of admission due to high intensity of service, high risk for further deterioration and high frequency of surveillance required.*    Family Communication: None at  bedside discussed with patient  Disposition Plan: We determined   Consultants:  PCCM critical care pulmonary  Procedures:  Intubation  Extubated August 03, 2018  Jugular vein central line  Antimicrobials:  Ceftriaxone Zithromax  and Plaquenil  DVT prophylaxis: Lovenox   Objective: Vitals:   08/04/18 1000 08/04/18 1100 08/04/18 1134 08/04/18 1200  BP: (!) 147/62 (!) 113/55  (!) 122/58  Pulse: 73 67  66  Resp: _0 Temp:   (!) 97.5 F (36.4 C)   TempSrc:   Axillary   SpO2: 96% 100%  100%  Weight:      Height:        Intake/Output Summary (Last 24 hours) at 08/04/2018 1219 Last data filed at 08/04/2018 1212 Gross per 24 hour  Intake 1663.36 ml  Output 2325 ml  Net -661.64 ml   Filed Weights   07/30/18 1210 08/03/18 0500 08/04/18 0317  Weight: 72.6 kg 69.7 kg 58.2 kg   Body mass index is 20.71 kg/m.  Exam:  . General: 73 y.o. year-old male well developed well nourished in no acute distress.  Alert and oriented x3.  Still generally weak was unable to lift himself up from the bed to sit up for examination . Cardiovascular: Regular rate and rhythm with no rubs or gallops.  No thyromegaly or JVD noted.   Marland Kitchen Respiratory: Bilateral fine rales to auscultation , no wheezes or rhonchi. Good inspiratory effort. . Abdomen: Soft nontender nondistended with normal bowel sounds x4 quadrants . Genitourinary: Foley is in place with urine bag. . Musculoskeletal: No lower extremity edema. 2/4 pulses in all 4 extremities. . Skin: No ulcerative lesions noted or rashes, . Psychiatry: Mood is appropriate for condition and setting    Data Reviewed: CBC: Recent Labs  Lab 07/31/18 0500 08/01/18 0318 08/02/18 0350 08/03/18 0500 08/04/18 0305  WBC 9.2 11.5* 18.9* 12.0* 12.7*  NEUTROABS 6.9 9.3* 14.3* 9.0* 9.2*  HGB 11.0* 10.5* 11.3* 9.8* 9.8*  HCT 34.0* 33.1* 35.8* 31.6* 31.7*  MCV 92.9 91.9 93.2 95.2 94.1  PLT 308 322 421* 302 128   Basic Metabolic Panel: Recent Labs  Lab 07/31/18 0500 08/01/18 0318 08/02/18 0350 08/03/18 0500 08/04/18 0305  NA 134* 136 135 137 135  K 4.4 3.7 3.4* 5.8* 3.8  CL 106 108 103 108 104  CO2 19* 20* 21* 23 22  GLUCOSE 104* 108* 103* 93 90  BUN 24* 19 26* 31* 25*   CREATININE 1.24 1.01 1.48* 1.19 1.05  CALCIUM 8.4* 8.6* 8.8* 8.4* 8.8*  MG 2.1 2.2 2.1 2.3 2.1  PHOS  --   --   --  3.1 2.8   GFR: Estimated Creatinine Clearance: 51.6 mL/min (by C-G formula based on SCr of 1.05 mg/dL). Liver Function Tests: Recent Labs  Lab 07/31/18 0500 08/01/18 0318 08/02/18 0350 08/03/18 0500 08/04/18 0305  AST 16 13* 13* 21 14*  ALT _1 ALKPHOS 47 47 51 40 43  BILITOT 0.2* 0.6 0.6 1.3* 0.4  PROT 5.9* 6.1* 6.3* 5.7* 6.1*  ALBUMIN 2.6* 2.9* 3.0* 2.5* 2.8*   No results for input(s): LIPASE, AMYLASE in the last 168 hours. No results for input(s): AMMONIA in the last 168 hours. Coagulation Profile: Recent Labs  Lab 07/30/18 1221  INR 1.1   Cardiac Enzymes: Recent Labs  Lab 07/30/18 1221 07/30/18 1412 07/31/18 0500 08/01/18 0318 08/02/18 0350 08/03/18 0500 08/04/18 0305  CKTOTAL  --   --  28* 19* 33* 94 56  TROPONINI 0.04* 0.04*  --   --   --   --   --    BNP (last 3 results) No results for input(s): PROBNP in the last 8760 hours. HbA1C: No results for input(s): HGBA1C in the last 72 hours. CBG: Recent Labs  Lab 08/02/18 2124 08/03/18 0032 08/03/18 0520 08/03/18 0747 08/03/18 1107  GLUCAP 131* 144* 98 106* 124*   Lipid Profile: Recent Labs    08/01/18 2123  TRIG 168*   Thyroid Function Tests: No results for input(s): TSH, T4TOTAL, FREET4, T3FREE, THYROIDAB in the last 72 hours. Anemia Panel: Recent Labs    08/02/18 0350  FERRITIN 177   Urine analysis:    Component Value Date/Time   COLORURINE YELLOW 07/30/2018 1224   APPEARANCEUR CLEAR 07/30/2018 1224   LABSPEC 1.015 07/30/2018 1224   PHURINE 5.0 07/30/2018 1224   GLUCOSEU NEGATIVE 07/30/2018 1224   GLUCOSEU NEGATIVE 11/04/2015 0948   HGBUR MODERATE (A) 07/30/2018 1224   BILIRUBINUR NEGATIVE 07/30/2018 1224   BILIRUBINUR NEG 09/24/2013 1412   KETONESUR NEGATIVE 07/30/2018 1224   PROTEINUR NEGATIVE 07/30/2018 1224   UROBILINOGEN 0.2 11/04/2015 0948    NITRITE NEGATIVE 07/30/2018 1224   LEUKOCYTESUR NEGATIVE 07/30/2018 1224   Sepsis Labs: _0 (procalcitonin:4,lacticidven:4)  ) Recent Results (from the past 240 hour(s))  Culture, blood (routine x 2)     Status: None   Collection Time: 07/30/18 12:21 PM  Result Value Ref Range Status   Specimen Description BLOOD RIGHT ANTECUBITAL DRAWN BY RN  Final   Special Requests   Final    BOTTLES DRAWN AEROBIC AND ANAEROBIC Blood Culture adequate volume   Culture   Final    NO GROWTH 5 DAYS Performed at [facility], [address], [place], [place] [address]    Report Status 08/04/2018 FINAL  Final  Urine culture     Status: None   Collection Time: 07/30/18 12:24 PM  Result Value Ref Range Status   Specimen Description   Final    URINE, RANDOM Performed at [facility], [address] [place], [place] [address]    Special Requests   Final    NONE Performed at [facility], [address] [place], [place] [address]    Culture   Final    NO GROWTH Performed at [facility], [place] [address] [place], [place] [address]    Report Status 07/31/2018 FINAL  Final  Culture, blood (routine x 2)     Status: None   Collection Time: 07/30/18  1:00 PM  Result Value Ref Range Status   Specimen Description BLOOD LEFT ANTECUBITAL  Final   Special Requests   Final    BOTTLES DRAWN AEROBIC AND ANAEROBIC Blood Culture adequate volume   Culture   Final    NO GROWTH 5 DAYS Performed at [facility], [address], [place], [place] [address]    Report Status 08/04/2018 FINAL  Final  Novel Coronavirus, NAA (hospital order; send-out to ref lab)     Status: None   Collection Time: 07/30/18  2:16 PM  Result Value Ref Range Status   SARS-CoV-2, NAA NOT DETECTED NOT DETECTED Final    Comment: Negative (Not Detected) results do not exclude infection caused by SARS CoV 2 and should not be used as the sole basis for treatment or other patient management decisions. Optimum specimen types and timing  for peak viral levels during infections caused  by SARS CoV 2 have not been determined. Collection of multiple specimens (types and  time points) from the same patient may be necessary to detect the virus. Improper specimen collection and handling, sequence variability underlying assay primers and or probes, or the presence of organisms in  quantities less than the limit of detection of the assay may lead to false negative results. Positive and negative predictive values of testing are highly dependent on prevalence. False negative results are more likely when prevalence of disease is high. (NOTE) The expected result is Negative (Not Detected). The SARS CoV 2 test is intended for the presumptive qualitative  detection of nucleic acid from SARS CoV 2 in upper and lower  respir atory specimens. Testing methodology is real time RT PCR. Test results must be correlated with clinical presentation and  evaluated in the context of other laboratory and epidemiologic data.  Test performance can be affected because the epidemiology and  clinical spectrum of infection caused by SARS CoV 2 is not fully  known. For example, the optimum types of specimens to collect and  when during the course of infection these specimens are most likely  to contain detectable viral RNA may not be known. This test has not been Food and Drug Administration (FDA) cleared or  approved and has been authorized by FDA under an Emergency Use  Authorization (EUA). The test is only authorized for the duration of  the declaration that circumstances exist justifying the authorization  of emergency use of in vitro diagnostic tests for detection and or  diagnosis of SARS CoV 2 under Section 564(b)(1) of the Act, 21 U.S.C.  section [phone] 3(b)(1), unless the authorization is terminated or   revoked sooner. [facility] is certified under the  Clinical Laboratory Improvement Amendments of 1988 (CLIA), 42 U.S.C.  section  [phone], to perform high complexity tests. Performed at [facility] [id number], [place], [place] [address] Laboratory Director: Loleta Books, MD Fact Sheet for Healthcare Providers  BankingDealers.co.za Fact Sheet for Patients  StrictlyIdeas.no Performed at [facility], [place] [address], [place], [place] [address]    Coronavirus Source NASOPHARYNGEAL  Final    Comment: Performed at [facility], [address] [place], [place] [address]  Respiratory Panel by PCR     Status: None   Collection Time: 07/30/18  2:16 PM  Result Value Ref Range Status   Adenovirus NOT DETECTED NOT DETECTED Final   Coronavirus 229E NOT DETECTED NOT DETECTED Final    Comment: (NOTE) The Coronavirus on the Respiratory Panel, DOES NOT test for the novel  Coronavirus (2019 nCoV)    Coronavirus HKU1 NOT DETECTED NOT DETECTED Final   Coronavirus NL63 NOT DETECTED NOT DETECTED Final   Coronavirus OC43 NOT DETECTED NOT DETECTED Final   Metapneumovirus NOT DETECTED NOT DETECTED Final   Rhinovirus / Enterovirus NOT DETECTED NOT DETECTED Final   Influenza A NOT DETECTED NOT DETECTED Final   Influenza B NOT DETECTED NOT DETECTED Final   Parainfluenza Virus 1 NOT DETECTED NOT DETECTED Final   Parainfluenza Virus 2 NOT DETECTED NOT DETECTED Final   Parainfluenza Virus 3 NOT DETECTED NOT DETECTED Final   Parainfluenza Virus 4 NOT DETECTED NOT DETECTED Final   Respiratory Syncytial Virus NOT DETECTED NOT DETECTED Final   Bordetella pertussis NOT DETECTED NOT DETECTED Final   Chlamydophila pneumoniae NOT DETECTED NOT DETECTED Final   Mycoplasma pneumoniae NOT DETECTED NOT DETECTED Final    Comment: Performed at [facility], [place] [address] [place], [place] [address]  MRSA PCR Screening     Status: None   Collection Time: 08/01/18 11:57 PM  Result Value Ref Range Status   MRSA by PCR NEGATIVE NEGATIVE  Final    Comment:        The GeneXpert MRSA Assay (FDA approved for NASAL specimens only), is one component of a comprehensive MRSA colonization surveillance program. It is not intended to diagnose MRSA infection nor to guide or monitor treatment for MRSA infections. Performed at [facility], [place] [address] [place], [place] [address]       Studies: Dg Chest Port 1 View  Result Date: 08/04/2018 CLINICAL DATA:  Respiratory failure EXAM: PORTABLE CHEST 1 VIEW COMPARISON:  08/04/2018 FINDINGS: Left chest tube is stable. Left jugular central venous catheter is stable with its tip at the cavoatrial junction. Extensive bilateral airspace disease is not significantly changed. No pneumothorax. IMPRESSION: Stable extensive bilateral airspace disease. Stable left chest tube without pneumothorax. Electronically Signed   By: Marybelle Killings M.D.   On: 08/04/2018 11:07   Dg Chest Port 1 View  Result Date: 08/04/2018 CLINICAL DATA:  Respiratory failure EXAM: PORTABLE CHEST 1 VIEW COMPARISON:  08/03/2018 FINDINGS: Pigtail chest tube on the left unchanged. No pneumothorax. Left jugular central venous catheter tip SVC/RA junction unchanged. Diffuse bilateral airspace disease left greater than right appears unchanged. Small left effusion unchanged. Underlying COPD. IMPRESSION: Left chest tube in place no pneumothorax Diffuse bilateral airspace disease left greater than right unchanged. Electronically Signed   By: Franchot Gallo M.D.   On: 08/04/2018 07:56    Scheduled Meds: . atorvastatin  10 mg Per Tube QHS  . chlorhexidine gluconate (MEDLINE KIT)  15 mL Mouth Rinse BID  . Chlorhexidine Gluconate Cloth  6 each Topical Daily  . enoxaparin (LOVENOX) injection  40 mg Subcutaneous Q24H  . escitalopram  10 mg Oral Daily  . famotidine  20 mg Oral Daily  . folic acid  1 mg Oral Daily  . Ipratropium-Albuterol  2 puff Inhalation Q6H  . latanoprost  1 drop Both Eyes QHS  . mouth rinse  15  mL Mouth Rinse BID  . mometasone-formoterol  2 puff Inhalation BID  . predniSONE  10 mg Oral Daily  . sodium chloride flush  10-40 mL Intracatheter Q12H  . timolol  1 drop Both Eyes Daily  . vitamin C  500 mg Oral BID  . zinc sulfate  220 mg Oral Daily    Continuous Infusions: . sodium chloride 10 mL/hr at 08/04/18 1212  . azithromycin Stopped (08/03/18 1425)  . cefTRIAXone (ROCEPHIN)  IV Stopped (08/04/18 1142)     LOS: 5 days     Cristal Deer, MD [facility]  To reach me or the doctor on call, go to: [email or web address] Password [email or web address]  08/04/2018, 12:19 PM

## 2018-08-04 NOTE — Progress Notes (Signed)
Dr. Tommy Medal called about pt negative Covid test. He feels pt needs to stay on precautions due to pt overall condition and presentation. He does not want to retest pt.

## 2018-08-05 LAB — CBC WITH DIFFERENTIAL/PLATELET
Abs Immature Granulocytes: 0.07 10*3/uL (ref 0.00–0.07)
Basophils Absolute: 0.1 10*3/uL (ref 0.0–0.1)
Basophils Relative: 1 %
Eosinophils Absolute: 0.5 10*3/uL (ref 0.0–0.5)
Eosinophils Relative: 5 %
HCT: 30.8 % — ABNORMAL LOW (ref 39.0–52.0)
Hemoglobin: 9.7 g/dL — ABNORMAL LOW (ref 13.0–17.0)
Immature Granulocytes: 1 %
Lymphocytes Relative: 16 %
Lymphs Abs: 1.5 10*3/uL (ref 0.7–4.0)
MCH: 29.3 pg (ref 26.0–34.0)
MCHC: 31.5 g/dL (ref 30.0–36.0)
MCV: 93.1 fL (ref 80.0–100.0)
Monocytes Absolute: 0.8 10*3/uL (ref 0.1–1.0)
Monocytes Relative: 9 %
Neutro Abs: 6.7 10*3/uL (ref 1.7–7.7)
Neutrophils Relative %: 68 %
Platelets: 238 10*3/uL (ref 150–400)
RBC: 3.31 MIL/uL — ABNORMAL LOW (ref 4.22–5.81)
RDW: 16.1 % — ABNORMAL HIGH (ref 11.5–15.5)
WBC: 9.6 10*3/uL (ref 4.0–10.5)
nRBC: 0 % (ref 0.0–0.2)

## 2018-08-05 LAB — BASIC METABOLIC PANEL
Anion gap: 8 (ref 5–15)
BUN: 21 mg/dL (ref 8–23)
CO2: 26 mmol/L (ref 22–32)
Calcium: 8.6 mg/dL — ABNORMAL LOW (ref 8.9–10.3)
Chloride: 104 mmol/L (ref 98–111)
Creatinine, Ser: 0.97 mg/dL (ref 0.61–1.24)
GFR calc Af Amer: >60 mL/min (ref >60)
GFR calc non Af Amer: >60 mL/min (ref >60)
Glucose, Bld: 90 mg/dL (ref 70–99)
Potassium: 3.8 mmol/L (ref 3.5–5.1)
Sodium: 138 mmol/L (ref 135–145)

## 2018-08-05 LAB — PHOSPHORUS: Phosphorus: 2.9 mg/dL (ref 2.5–4.6)

## 2018-08-05 LAB — MAGNESIUM: Magnesium: 2.1 mg/dL (ref 1.7–2.4)

## 2018-08-05 MED ORDER — METOPROLOL TARTRATE 5 MG/5ML IV SOLN: 5.0000 mg | Freq: Once | INTRAVENOUS | Status: DC

## 2018-08-05 NOTE — Progress Notes (Signed)
Physical Therapy Treatment Patient Details Name: Joel Valdez. [place] MRN: [id number] DOB: May 04, 1945 Today's Date: 08/05/2018    History of Present Illness Pt admitted 2* SOB with acute respiratory failure and intubated 07/31/08 and extubated 08/02/08.  Pt  with hx of RA, asthma, anxiety/depression, and deafness R ear    PT Comments    The patient  With decreased Oxygen saturation to low 70's while on 15 L. HFNC while mobilizing to standing and ambulated x 5'. RN in and placed  Patient on NRB. Patient assisted  To [facility] then back to bed. Oxygen sats returned to 95% on NRB.  Follow Up Recommendations  (TBD)     Equipment Recommendations    none   Recommendations for Other Services       Precautions / Restrictions Precautions Precautions: Fall Precaution Comments: ask RN to place on NRB for mobility, watch sats, drops O2    Mobility  Bed Mobility Overal bed mobility: Needs Assistance Bed Mobility: Supine to Sit     Supine to sit: Min guard     General bed mobility comments: patient did not require assist  for mobilizing out of and into bed. Extra time for SOB  Transfers Overall transfer level: Needs assistance Equipment used: Rolling walker (2 wheeled) Transfers: Sit to/from [facility] Sit to Stand: Min assist         General transfer comment: assist to steady, encouraged rest breaks, concentrate on breaths  Ambulation/Gait Ambulation/Gait assistance: Min assist Gait Distance (Feet): 5 Feet Assistive device: Rolling walker (2 wheeled)   Gait velocity: decr   General Gait Details: ambulated x 5'. Stood at window x 2 minutes. Noted decreased sats to 72 % on 15 L. HFNC. Assisted to [facility]. Then assisted  back to bed with decreased Sats to 70%. RN came in and placed patient on NRB. with sats gradually increased to 95%.   Stairs             Wheelchair Mobility    Modified Rankin (Stroke Patients Only)       Balance Overall balance assessment:  Needs assistance Sitting-balance support: No upper extremity supported;Feet supported Sitting balance-Leahy Scale: Fair     Standing balance support: Bilateral upper extremity supported Standing balance-Leahy Scale: Fair                              Cognition Arousal/Alertness: Awake/alert Behavior During Therapy: WFL for tasks assessed/performed                                          Exercises      General Comments        Pertinent Vitals/Pain Faces Pain Scale: Hurts little more Pain Location: R > L  shoulders and L side Pain Descriptors / Indicators: Aching Pain Intervention(s): Limited activity within patient's tolerance;Monitored during session;Repositioned(encouraged AAROM both shoulders)    Home Living                      Prior Function            PT Goals (current goals can now be found in the care plan section) Progress towards PT goals: Progressing toward goals    Frequency    Min 3X/week      PT Plan Current plan remains appropriate    Co-evaluation  AM-PAC PT "6 Clicks" Mobility   Outcome Measure  Help needed turning from your back to your side while in a flat bed without using bedrails?: None Help needed moving from lying on your back to sitting on the side of a flat bed without using bedrails?: None Help needed moving to and from a bed to a chair (including a wheelchair)?: A Little Help needed standing up from a chair using your arms (e.g., wheelchair or bedside chair)?: A Little Help needed to walk in hospital room?: A Lot Help needed climbing 3-5 steps with a railing? : A Lot 6 Click Score: 18    End of Session Equipment Utilized During Treatment: Oxygen Activity Tolerance: Treatment limited secondary to medical complications (Comment) Patient left: in bed;with call bell/phone within reach;with bed alarm set;with nursing/sitter in room Nurse Communication: Mobility status PT  Visit Diagnosis: Difficulty in walking, not elsewhere classified (R26.2);Muscle weakness (generalized) (M62.81)     Time: 5329-9242 PT Time Calculation (min) (ACUTE ONLY): 44 min  Charges:  $Therapeutic Activity: 38-52 mins                     Tresa Endo PT Acute Rehabilitation Services Pager [phone] Office [phone]    Claretha Cooper 08/05/2018, 1:03 PM

## 2018-08-05 NOTE — Progress Notes (Addendum)
PROGRESS NOTE  Joel Valdez CXK:[id number] DOB: Nov 20, 1945 DOA: 07/30/2018 PCP: [name], PA-C  HPI/Recap of past 62 hours: 73 year old male with multiple comorbidities and immunosuppression which includes prostate cancer rheumatoid arthritis, chronic steroid and methotrexate therapy, gastroesophageal reflux essential hypertension glaucoma prostate cancer, currently receiving Lupron injection, depression.  Who was admitted with respiratory failure with bilateral infiltrate been ruled out for COVID-19 pneumonia status post intubation extubated August 03, 2018   subjective: Patient seen and examined at intensive care unit.  Complaining of back pain.  Wants something stronger for pain relief .nurse reported: Pt stable overall though did get short of breath with exertion with physical therapy and occupational therapy. Pt did desat to 84 percent on 15l of high flow  and became pale. Pt placed back on non rebreather due to this shortness of breath overall. Pt 02 sat rose back up to 100 percent and breathing pattern improved overall.   08/05/2018 SUBJECTIVE: Seen and examined at bedside.  Patient is doing better.  He was started on tramadol for back pain which he said is helping  Full PPE donned and doffed as stipulated by CDC and health department  Assessment/Plan: Principal Problem:   Pneumonia due to COVID-19 virus Active Problems:   Glaucoma   Essential hypertension, benign   Prostate cancer ([place])   Depression   Rheumatoid arthritis involving both hands ([place])   Essential hypertension  #1 acute toxic on chronic respiratory failure status post intubation.  Patient currently on high flow oxygen  2.  Rheumatoid arthritis we will continue his prednisone his methotrexate is on hold for now.  3.  Hypokalemia that has been replaced  4.  Pneumonia on identified etiology, with ruled out his COVID-19 is negative but this could be false negative we will continue ceftriaxone azithromycin  he has completed his Plaquenil we will continue his ZINC for 2 weeks and vitamin C.  Infectious disease will need to reevaluate to see if this was a false negative COVID-19  5.  Left pneumothorax resolved his chest tube was removed we will follow-up on the x-ray  6.  History of prostate cancer Lupron is on hold for right now  7.  Low back pain.  Patient complains of pain is not controlled with Tylenol.  So I will add tramadol as needed.  8.  Leukocytosis resolved.  Addendum:Dr. Tommy Medal called about pt negative Covid test. He feels pt needs to stay on precautions due to pt overall condition and presentation. He does not want to retest pt    Code Status: Partial code: Patient would like to have intubation but no CPR /defibrillation in case of  cardiac arrest  Severity of Illness: The appropriate patient status for this patient is INPATIENT. Inpatient status is judged to be reasonable and necessary in order to provide the required intensity of service to ensure the patient's safety. The patient's presenting symptoms, physical exam findings, and initial radiographic and laboratory data in the context of their chronic comorbidities is felt to place them at high risk for further clinical deterioration. Furthermore, it is not anticipated that the patient will be medically stable for discharge from the hospital within 2 midnights of admission. The following factors support the patient status of inpatient.    Respiratory distress requiring high flow oxygen status post intubation and stable at this time still.   * I certify that at the point of admission it is my clinical judgment that the patient will require inpatient hospital care spanning  beyond 2 midnights from the point of admission due to high intensity of service, high risk for further deterioration and high frequency of surveillance required.*    Family Communication: None at bedside discussed with patient  Disposition Plan: to be   determined   Consultants:  PCCM critical care pulmonary  Procedures:  Intubation  Extubated August 03, 2018  Jugular vein central line  Antimicrobials:  Ceftriaxone, Zithromax and Plaquenil  DVT prophylaxis: Lovenox   Objective: Vitals:   08/05/18 1139 08/05/18 1200 08/05/18 1300 08/05/18 1330  BP:  (!) 111/52 (!) 110/56   Pulse:  66 65   Resp:  (!) 23 (!) 24   Temp: (!) 97.5 F (36.4 C)   98 F (36.7 C)  TempSrc: Axillary   Oral  SpO2:  99% 99%   Weight:      Height:        Intake/Output Summary (Last 24 hours) at 08/05/2018 1628 Last data filed at 08/05/2018 1100 Gross per 24 hour  Intake 758.46 ml  Output 1325 ml  Net -566.54 ml   Filed Weights   08/03/18 0500 08/04/18 0317 08/05/18 0500  Weight: 69.7 kg 58.2 kg 69.3 kg   Body mass index is 24.66 kg/m.  Exam:   General: 73 y.o. year-old male well developed well nourished in no acute distress.  Alert and oriented x3.  Still generally better and affect is brighter.  Cardiovascular: Regular rate and rhythm with no rubs or gallops.  No thyromegaly or JVD noted.    Respiratory: Bilateral fine rales to auscultation , no wheezes or rhonchi. Good inspiratory effort.  Abdomen: Soft nontender nondistended with normal bowel sounds x4 quadrants  Genitourinary: Foley is in place with urine bag.  Musculoskeletal: No lower extremity edema. 2/4 pulses in all 4 extremities.  Skin: No ulcerative lesions noted or rashes,  Psychiatry: Mood is appropriate for condition and setting    Data Reviewed: CBC: Recent Labs  Lab 08/01/18 0318 08/02/18 0350 08/03/18 0500 08/04/18 0305 08/05/18 0502  WBC 11.5* 18.9* 12.0* 12.7* 9.6  NEUTROABS 9.3* 14.3* 9.0* 9.2* 6.7  HGB 10.5* 11.3* 9.8* 9.8* 9.7*  HCT 33.1* 35.8* 31.6* 31.7* 30.8*  MCV 91.9 93.2 95.2 94.1 93.1  PLT 322 421* 302 313 128   Basic Metabolic Panel: Recent Labs  Lab 08/01/18 0318 08/02/18 0350 08/03/18 0500 08/04/18 0305 08/05/18 0502  NA  136 135 137 135 138  K 3.7 3.4* 5.8* 3.8 3.8  CL 108 103 108 104 104  CO2 20* 21* _0 GLUCOSE 108* 103* 93 90 90  BUN 19 26* 31* 25* 21  CREATININE 1.01 1.48* 1.19 1.05 0.97  CALCIUM 8.6* 8.8* 8.4* 8.8* 8.6*  MG 2.2 2.1 2.3 2.1 2.1  PHOS  --   --  3.1 2.8 2.9   GFR: Estimated Creatinine Clearance: 61.2 mL/min (by C-G formula based on SCr of 0.97 mg/dL). Liver Function Tests: Recent Labs  Lab 07/31/18 0500 08/01/18 0318 08/02/18 0350 08/03/18 0500 08/04/18 0305  AST 16 13* 13* 21 14*  ALT _1 ALKPHOS 47 47 51 40 43  BILITOT 0.2* 0.6 0.6 1.3* 0.4  PROT 5.9* 6.1* 6.3* 5.7* 6.1*  ALBUMIN 2.6* 2.9* 3.0* 2.5* 2.8*   No results for input(s): LIPASE, AMYLASE in the last 168 hours. No results for input(s): AMMONIA in the last 168 hours. Coagulation Profile: Recent Labs  Lab 07/30/18 1221  INR 1.1   Cardiac Enzymes: Recent Labs  Lab 07/30/18 1221  07/30/18 1412 07/31/18 0500 08/01/18 0318 08/02/18 0350 08/03/18 0500 08/04/18 0305  CKTOTAL  --   --  28* 19* 33* 94 56  TROPONINI 0.04* 0.04*  --   --   --   --   --    BNP (last 3 results) No results for input(s): PROBNP in the last 8760 hours. HbA1C: No results for input(s): HGBA1C in the last 72 hours. CBG: Recent Labs  Lab 08/02/18 2124 08/03/18 0032 08/03/18 0520 08/03/18 0747 08/03/18 1107  GLUCAP 131* 144* 98 106* 124*   Lipid Profile: No results for input(s): CHOL, HDL, LDLCALC, TRIG, CHOLHDL, LDLDIRECT in the last 72 hours. Thyroid Function Tests: No results for input(s): TSH, T4TOTAL, FREET4, T3FREE, THYROIDAB in the last 72 hours. Anemia Panel: No results for input(s): VITAMINB12, FOLATE, FERRITIN, TIBC, IRON, RETICCTPCT in the last 72 hours. Urine analysis:    Component Value Date/Time   COLORURINE YELLOW 07/30/2018 1224   APPEARANCEUR CLEAR 07/30/2018 1224   LABSPEC 1.015 07/30/2018 1224   PHURINE 5.0 07/30/2018 1224   GLUCOSEU NEGATIVE 07/30/2018 1224   GLUCOSEU NEGATIVE  11/04/2015 0948   HGBUR MODERATE (A) 07/30/2018 1224   BILIRUBINUR NEGATIVE 07/30/2018 1224   BILIRUBINUR NEG 09/24/2013 1412   KETONESUR NEGATIVE 07/30/2018 1224   PROTEINUR NEGATIVE 07/30/2018 1224   UROBILINOGEN 0.2 11/04/2015 0948   NITRITE NEGATIVE 07/30/2018 1224   LEUKOCYTESUR NEGATIVE 07/30/2018 1224   Sepsis Labs: _0 (procalcitonin:4,lacticidven:4)  ) Recent Results (from the past 240 hour(s))  Culture, blood (routine x 2)     Status: None   Collection Time: 07/30/18 12:21 PM  Result Value Ref Range Status   Specimen Description BLOOD RIGHT ANTECUBITAL DRAWN BY RN  Final   Special Requests   Final    BOTTLES DRAWN AEROBIC AND ANAEROBIC Blood Culture adequate volume   Culture   Final    NO GROWTH 5 DAYS Performed at [facility], [address], [place], [place] [address]    Report Status 08/04/2018 FINAL  Final  Urine culture     Status: None   Collection Time: 07/30/18 12:24 PM  Result Value Ref Range Status   Specimen Description   Final    URINE, RANDOM Performed at [facility], [address] [place], [place] [address]    Special Requests   Final    NONE Performed at [facility], [address] [place], [place] [address]    Culture   Final    NO GROWTH Performed at [facility], [place] [address] [place], [place] [address]    Report Status 07/31/2018 FINAL  Final  Culture, blood (routine x 2)     Status: None   Collection Time: 07/30/18  1:00 PM  Result Value Ref Range Status   Specimen Description BLOOD LEFT ANTECUBITAL  Final   Special Requests   Final    BOTTLES DRAWN AEROBIC AND ANAEROBIC Blood Culture adequate volume   Culture   Final    NO GROWTH 5 DAYS Performed at [facility], [address], [place], [place] [address]    Report Status 08/04/2018 FINAL  Final  Novel Coronavirus, NAA (hospital order; send-out to ref lab)     Status: None   Collection Time: 07/30/18  2:16 PM  Result Value Ref Range Status   SARS-CoV-2, NAA NOT  DETECTED NOT DETECTED Final    Comment: Negative (Not Detected) results do not exclude infection caused by SARS CoV 2 and should not be used as the sole basis for treatment  or other patient management decisions. Optimum specimen types and timing for peak viral levels during infections caused  by SARS CoV 2 have not been determined. Collection of multiple specimens (types and time points) from the same patient may be necessary to detect the virus. Improper specimen collection and handling, sequence variability underlying assay primers and or probes, or the presence of organisms in  quantities less than the limit of detection of the assay may lead to false negative results. Positive and negative predictive values of testing are highly dependent on prevalence. False negative results are more likely when prevalence of disease is high. (NOTE) The expected result is Negative (Not Detected). The SARS CoV 2 test is intended for the presumptive qualitative  detection of nucleic acid from SARS CoV 2 in upper and lower  respir atory specimens. Testing methodology is real time RT PCR. Test results must be correlated with clinical presentation and  evaluated in the context of other laboratory and epidemiologic data.  Test performance can be affected because the epidemiology and  clinical spectrum of infection caused by SARS CoV 2 is not fully  known. For example, the optimum types of specimens to collect and  when during the course of infection these specimens are most likely  to contain detectable viral RNA may not be known. This test has not been Food and Drug Administration (FDA) cleared or  approved and has been authorized by FDA under an Emergency Use  Authorization (EUA). The test is only authorized for the duration of  the declaration that circumstances exist justifying the authorization  of emergency use of in vitro diagnostic tests for detection and or  diagnosis of SARS CoV 2 under Section  564(b)(1) of the Act, 21 U.S.C.  section [phone] 3(b)(1), unless the authorization is terminated or   revoked sooner. [facility] is certified under the  Clinical Laboratory Improvement Amendments of 1988 (CLIA), 42 U.S.C.  section [phone], to perform high complexity tests. Performed at [place] [id number], [place], [place] [address] Laboratory Director: Loleta Books, MD Fact Sheet for Healthcare Providers  BankingDealers.co.za Fact Sheet for Patients  StrictlyIdeas.no Performed at [facility], [place] [address], [place], [place] [address]    Coronavirus Source NASOPHARYNGEAL  Final    Comment: Performed at [facility], [address] [place], [place] [address]  Respiratory Panel by PCR     Status: None   Collection Time: 07/30/18  2:16 PM  Result Value Ref Range Status   Adenovirus NOT DETECTED NOT DETECTED Final   Coronavirus 229E NOT DETECTED NOT DETECTED Final    Comment: (NOTE) The Coronavirus on the Respiratory Panel, DOES NOT test for the novel  Coronavirus (2019 nCoV)    Coronavirus HKU1 NOT DETECTED NOT DETECTED Final   Coronavirus NL63 NOT DETECTED NOT DETECTED Final   Coronavirus OC43 NOT DETECTED NOT DETECTED Final   Metapneumovirus NOT DETECTED NOT DETECTED Final   Rhinovirus / Enterovirus NOT DETECTED NOT DETECTED Final   Influenza A NOT DETECTED NOT DETECTED Final   Influenza B NOT DETECTED NOT DETECTED Final   Parainfluenza Virus 1 NOT DETECTED NOT DETECTED Final   Parainfluenza Virus 2 NOT DETECTED NOT DETECTED Final   Parainfluenza Virus 3 NOT DETECTED NOT DETECTED Final   Parainfluenza Virus 4 NOT DETECTED NOT DETECTED Final   Respiratory Syncytial Virus NOT DETECTED NOT DETECTED Final   Bordetella pertussis NOT DETECTED NOT DETECTED Final   Chlamydophila  pneumoniae NOT DETECTED NOT DETECTED Final   Mycoplasma pneumoniae NOT  DETECTED NOT DETECTED Final    Comment: Performed at [facility], [place] [address] [place], [place] [address]  MRSA PCR Screening     Status: None   Collection Time: 08/01/18 11:57 PM  Result Value Ref Range Status   MRSA by PCR NEGATIVE NEGATIVE Final    Comment:        The GeneXpert MRSA Assay (FDA approved for NASAL specimens only), is one component of a comprehensive MRSA colonization surveillance program. It is not intended to diagnose MRSA infection nor to guide or monitor treatment for MRSA infections. Performed at [facility], [place] [address], [place], [place] [address]       Studies: No results found.  Scheduled Meds:  atorvastatin  10 mg Per Tube QHS   chlorhexidine gluconate (MEDLINE KIT)  15 mL Mouth Rinse BID   Chlorhexidine Gluconate Cloth  6 each Topical Daily   enoxaparin (LOVENOX) injection  40 mg Subcutaneous Q24H   escitalopram  10 mg Oral Daily   famotidine  20 mg Oral Daily   folic acid  1 mg Oral Daily   Ipratropium-Albuterol  2 puff Inhalation Q6H   latanoprost  1 drop Both Eyes QHS   mouth rinse  15 mL Mouth Rinse BID   mometasone-formoterol  2 puff Inhalation BID   predniSONE  10 mg Oral Daily   sodium chloride flush  10-40 mL Intracatheter Q12H   timolol  1 drop Both Eyes Daily   vitamin C  500 mg Oral BID   zinc sulfate  220 mg Oral Daily    Continuous Infusions:  sodium chloride Stopped (08/05/18 0821)   azithromycin 500 mg (08/05/18 1333)   cefTRIAXone (ROCEPHIN)  IV Stopped (08/05/18 0851)     LOS: 6 days     Cristal Deer, MD [facility]  To reach me or the doctor on call, go to: [email or web address] Password [facility]  08/05/2018, 4:28 PM

## 2018-08-06 ENCOUNTER — Inpatient Hospital Stay (HOSPITAL_COMMUNITY): Payer: Medicare Other

## 2018-08-06 DIAGNOSIS — H409 Unspecified glaucoma: Secondary | ICD-10-CM

## 2018-08-06 DIAGNOSIS — F329 Major depressive disorder, single episode, unspecified: Secondary | ICD-10-CM

## 2018-08-06 MED ORDER — FUROSEMIDE 10 MG/ML IJ SOLN
60.0000 mg | INTRAMUSCULAR | Status: AC
  Administered 2018-08-06: 16:00:00 60 mg via INTRAVENOUS
  Filled 2018-08-06: qty 6

## 2018-08-06 MED ORDER — GUAIFENESIN ER 600 MG PO TB12
1200.0000 mg | ORAL_TABLET | Freq: Two times a day (BID) | ORAL | Status: DC
  Administered 2018-08-06 – 2018-08-15 (×18): 1200 mg via ORAL
  Filled 2018-08-06 (×18): qty 2

## 2018-08-06 MED ORDER — FUROSEMIDE 10 MG/ML IJ SOLN: 40.0000 mg | Freq: Every day | INTRAMUSCULAR | Status: DC

## 2018-08-06 MED ORDER — PANTOPRAZOLE SODIUM 40 MG PO TBEC
40.0000 mg | DELAYED_RELEASE_TABLET | Freq: Every day | ORAL | Status: DC
  Administered 2018-08-06 – 2018-08-15 (×10): 40 mg via ORAL
  Filled 2018-08-06 (×10): qty 1

## 2018-08-06 MED ORDER — POTASSIUM CHLORIDE CRYS ER 20 MEQ PO TBCR
40.0000 meq | EXTENDED_RELEASE_TABLET | Freq: Once | ORAL | Status: AC
  Administered 2018-08-06: 40 meq via ORAL
  Filled 2018-08-06: qty 2

## 2018-08-06 MED ORDER — FUROSEMIDE 10 MG/ML IJ SOLN: 60.0000 mg | Freq: Once | INTRAMUSCULAR | Status: DC

## 2018-08-06 MED ORDER — HYDRALAZINE HCL 20 MG/ML IJ SOLN: 5.0000 mg | Freq: Four times a day (QID) | INTRAMUSCULAR | Status: DC | PRN

## 2018-08-06 NOTE — Telephone Encounter (Signed)
Called and spoke to patient's sister apt CX . Patient is at [facility] with Pneumonia.

## 2018-08-06 NOTE — Progress Notes (Signed)
OT Cancellation Note  Patient Details Name: Joel Valdez MRN: [id number] DOB: Dec 06, 1945   Cancelled Treatment:    Reason Eval/Treat Not Completed: Other (comment)  Spoke with pts RN. Pt currently resting.  Pt had been on BSC earlier. Will check back next day  Kari Baars, [place] [phone] Office- [phone], Thereasa Parkin 08/06/2018, 4:23 PM

## 2018-08-06 NOTE — Progress Notes (Addendum)
PROGRESS NOTE                                                                                                                                                                                                             Patient Demographics:    Joel Valdez, is a 73 y.o. male, DOB - 1946/02/17, FHL:[id number]  Admit date - 07/30/2018   Admitting Physician No admitting provider for patient encounter.  Outpatient Primary MD for the patient is Delorse Limber  LOS - 7   Chief Complaint  Patient presents with   Shortness of Breath       Brief Narrative   73 year old male with multiple comorbidities and immunosuppression which includes prostate cancer rheumatoid arthritis, chronic steroid and methotrexate therapy, gastroesophageal reflux essential hypertension glaucoma prostate cancer, currently receiving Lupron injection, depression.  Who was admitted with respiratory failure with bilateral infiltrate  pneumonia status post intubation extubated August 03, 2018, as well patient had pneumothorax, will require chest tube, discontinued on 03/15/2019, remains on 15 L high flow nasal cannula this morning.    Subjective:    Joel Valdez today for some shortness of breath, weakness, denies any chest pain, fever or chills .   Assessment  & Plan :    Principal Problem:   Pneumonia due to COVID-19 virus Active Problems:   Glaucoma   Essential hypertension, benign   Prostate cancer ([place])   Depression   Rheumatoid arthritis involving both hands ([place])   Essential hypertension   Hypoxic respiratory failure  -Secondary to multifocal pneumonia, required intubation 12/01/2018, extubated 14 2020  -Remains profoundly hypoxic, requiring 15 high flow nasal cannula  -Encouraged to use incentive spirometry today, started on IV diuresis, started on awake prone . -I have discussed with the sister, reports brother has history of tuberculosis which was treated, as  well he had history of asbestosis .  Multifocal pneumonia -CABG 19 is negative, reviewed MD discussed with ID, could be related to false negative, and was treated with Plaquenil, continue with IV Rocephin and azithromycin for now. -We will follow inflammatory markers, will check CRP, ferritin, D-dimers, fibrinogen and LDH tomorrow -Continue with zinc and vitamin C  Left pneumothorax -Chest tube removed, repeat x-ray today with no evidence of recurrence of pneumothorax  Rheumatoid arthritis  -Continue with prednisone, resume methotrexate once stable as an  outpatient setting, continue to hold during hospital stay   Hypertension - BP stable without any medications, will start on PRN hydralazine  Depression - continue with lexapro  Hyperlipidemia - continue with statine  Glaucoma - continue with home meds  Hypokalemia - Repleted  History of prostate cancer - on  Lupron she is currently on hold for right now  Code Status : Partial   Family Communication  : None at bedside  Disposition Plan  : remains in ICU  Consults  :  PCCM  Procedures  :  4/8 ET>>4/10 4/8 lijcvl>> 4/8 left ct>>4/11   DVT Prophylaxis  :   Lab Results  Component Value Date   PLT 238 08/05/2018    Antibiotics  :  [place] lovenox  Anti-infectives (From admission, onward)   Start     Dose/Rate Route Frequency Ordered Stop   08/02/18 0800  valACYclovir (VALTREX) tablet 1,000 mg  Status:  Discontinued     1,000 mg Per Tube 3 times daily 08/01/18 2049 08/03/18 1222   08/02/18 0800  hydroxychloroquine (PLAQUENIL) tablet 200 mg     200 mg Per Tube 2 times daily 08/01/18 2054 08/04/18 1115   07/31/18 2000  hydroxychloroquine (PLAQUENIL) tablet 200 mg  Status:  Discontinued     200 mg Oral 2 times daily 07/30/18 1803 08/01/18 2054   07/31/18 1400  azithromycin (ZITHROMAX) 500 mg in sodium chloride 0.9 % 250 mL IVPB     500 mg 250 mL/hr over 60 Minutes Intravenous Every 24 hours 07/30/18 1400      07/31/18 1000  cefTRIAXone (ROCEPHIN) 1 g in sodium chloride 0.9 % 100 mL IVPB     1 g 200 mL/hr over 30 Minutes Intravenous Every 24 hours 07/31/18 0827     07/30/18 2000  hydroxychloroquine (PLAQUENIL) tablet 400 mg     400 mg Oral 2 times daily 07/30/18 1803 07/31/18 0748   07/30/18 1600  valACYclovir (VALTREX) tablet 1,000 mg  Status:  Discontinued     1,000 mg Oral 3 times daily 07/30/18 1404 08/01/18 2049   07/30/18 1415  hydroxychloroquine (PLAQUENIL) tablet 200 mg  Status:  Discontinued     200 mg Oral 2 times daily 07/30/18 1400 07/30/18 1803   07/30/18 1330  levofloxacin (LEVAQUIN) IVPB 750 mg  Status:  Discontinued     750 mg 100 mL/hr over 90 Minutes Intravenous Every 24 hours 07/30/18 1316 07/30/18 1407        Objective:   Vitals:   08/06/18 1000 08/06/18 1100 08/06/18 1200 08/06/18 1300  BP: 131/65 (!) 123/52 125/65 120/64  Pulse: 73 69 67 76  Resp: (!) 29 (!) 24 (!) 21 (!) 28  Temp:      TempSrc:      SpO2: 97% 95% 97% 92%  Weight:      Height:        Wt Readings from Last 3 Encounters:  08/06/18 68.6 kg  06/05/18 76.7 kg  05/22/18 75.3 kg     Intake/Output Summary (Last 24 hours) at 08/06/2018 1521 Last data filed at 08/06/2018 1300 Gross per 24 hour  Intake --  Output 2250 ml  Net -2250 ml     Physical Exam  Awake Alert, Oriented X 3,, extremely frail no new F.N deficits, Normal affect Symmetrical Chest wall movement, diminished air entry at the bases, with scattered rails, no rhonchi RRR,No Gallops,Rubs or new Murmurs, No Parasternal Heave +ve B.Sounds, Abd Soft, No tenderness, No rebound - guarding or rigidity. No Cyanosis,  Clubbing or edema, No new Rash or bruise     Data Review:    CBC Recent Labs  Lab 08/01/18 0318 08/02/18 0350 08/03/18 0500 08/04/18 0305 08/05/18 0502  WBC 11.5* 18.9* 12.0* 12.7* 9.6  HGB 10.5* 11.3* 9.8* 9.8* 9.7*  HCT 33.1* 35.8* 31.6* 31.7* 30.8*  PLT 322 421* 302 313 238  MCV 91.9 93.2 95.2 94.1 93.1  MCH  29.2 29.4 29.5 29.1 29.3  MCHC 31.7 31.6 31.0 30.9 31.5  RDW 16.1* 16.3* 16.5* 15.9* 16.1*  LYMPHSABS 1.2 2.6 1.2 1.8 1.5  MONOABS 0.9 1.6* 1.3* 1.2* 0.8  EOSABS 0.1 0.2 0.5 0.3 0.5  BASOSABS 0.0 0.1 0.1 0.1 0.1    Chemistries  Recent Labs  Lab 07/31/18 0500 08/01/18 0318 08/02/18 0350 08/03/18 0500 08/04/18 0305 08/05/18 0502  NA 134* 136 135 137 135 138  K 4.4 3.7 3.4* 5.8* 3.8 3.8  CL 106 108 103 108 104 104  CO2 19* 20* 21* _0 GLUCOSE 104* 108* 103* 93 90 90  BUN 24* 19 26* 31* 25* 21  CREATININE 1.24 1.01 1.48* 1.19 1.05 0.97  CALCIUM 8.4* 8.6* 8.8* 8.4* 8.8* 8.6*  MG 2.1 2.2 2.1 2.3 2.1 2.1  AST 16 13* 13* 21 14*  --   ALT _1 --   ALKPHOS 47 47 51 40 43  --   BILITOT 0.2* 0.6 0.6 1.3* 0.4  --    ------------------------------------------------------------------------------------------------------------------ No results for input(s): CHOL, HDL, LDLCALC, TRIG, CHOLHDL, LDLDIRECT in the last 72 hours.  Lab Results  Component Value Date   HGBA1C 5.8 04/10/2017   ------------------------------------------------------------------------------------------------------------------ No results for input(s): TSH, T4TOTAL, T3FREE, THYROIDAB in the last 72 hours.  Invalid input(s): FREET3 ------------------------------------------------------------------------------------------------------------------ No results for input(s): VITAMINB12, FOLATE, FERRITIN, TIBC, IRON, RETICCTPCT in the last 72 hours.  Coagulation profile No results for input(s): INR, PROTIME in the last 168 hours.  No results for input(s): DDIMER in the last 72 hours.  Cardiac Enzymes No results for input(s): CKMB, TROPONINI, MYOGLOBIN in the last 168 hours.  Invalid input(s): CK ------------------------------------------------------------------------------------------------------------------    Component Value Date/Time   BNP 94.0 07/30/2018 1221    Inpatient  Medications  Scheduled Meds:  atorvastatin  10 mg Per Tube QHS   chlorhexidine gluconate (MEDLINE KIT)  15 mL Mouth Rinse BID   Chlorhexidine Gluconate Cloth  6 each Topical Daily   enoxaparin (LOVENOX) injection  40 mg Subcutaneous Q24H   escitalopram  10 mg Oral Daily   famotidine  20 mg Oral Daily   folic acid  1 mg Oral Daily   [START ON 08/07/2018] furosemide  40 mg Intravenous Daily   furosemide  60 mg Intravenous NOW   Ipratropium-Albuterol  2 puff Inhalation Q6H   latanoprost  1 drop Both Eyes QHS   mouth rinse  15 mL Mouth Rinse BID   mometasone-formoterol  2 puff Inhalation BID   potassium chloride  40 mEq Oral Once   predniSONE  10 mg Oral Daily   sodium chloride flush  10-40 mL Intracatheter Q12H   timolol  1 drop Both Eyes Daily   vitamin C  500 mg Oral BID   zinc sulfate  220 mg Oral Daily   Continuous Infusions:  sodium chloride Stopped (08/05/18 0821)   azithromycin Stopped (08/06/18 1343)   cefTRIAXone (ROCEPHIN)  IV Stopped (08/06/18 0851)   PRN Meds:.sodium chloride, acetaminophen (TYLENOL) oral liquid 160 mg/5 mL, chlorpheniramine-HYDROcodone, guaiFENesin-dextromethorphan, ondansetron **OR** ondansetron (ZOFRAN) IV, sodium  chloride flush, traMADol  Micro Results Recent Results (from the past 240 hour(s))  Culture, blood (routine x 2)     Status: None   Collection Time: 07/30/18 12:21 PM  Result Value Ref Range Status   Specimen Description BLOOD RIGHT ANTECUBITAL DRAWN BY RN  Final   Special Requests   Final    BOTTLES DRAWN AEROBIC AND ANAEROBIC Blood Culture adequate volume   Culture   Final    NO GROWTH 5 DAYS Performed at [facility], [address] [place], [place] [address]    Report Status 08/04/2018 FINAL  Final  Urine culture     Status: None   Collection Time: 07/30/18 12:24 PM  Result Value Ref Range Status   Specimen Description   Final    URINE, RANDOM Performed at [facility], [address], [place], [place]  [address]    Special Requests   Final    NONE Performed at [facility], [address], [place], [place] [address]    Culture   Final    NO GROWTH Performed at [facility], [place] [address] [place], [place] [address]    Report Status 07/31/2018 FINAL  Final  Culture, blood (routine x 2)     Status: None   Collection Time: 07/30/18  1:00 PM  Result Value Ref Range Status   Specimen Description BLOOD LEFT ANTECUBITAL  Final   Special Requests   Final    BOTTLES DRAWN AEROBIC AND ANAEROBIC Blood Culture adequate volume   Culture   Final    NO GROWTH 5 DAYS Performed at [facility], [address] [place], [place] [address]    Report Status 08/04/2018 FINAL  Final  Novel Coronavirus, NAA (hospital order; send-out to ref lab)     Status: None   Collection Time: 07/30/18  2:16 PM  Result Value Ref Range Status   SARS-CoV-2, NAA NOT DETECTED NOT DETECTED Final    Comment: Negative (Not Detected) results do not exclude infection caused by SARS CoV 2 and should not be used as the sole basis for treatment or other patient management decisions. Optimum specimen types and timing for peak viral levels during infections caused  by SARS CoV 2 have not been determined. Collection of multiple specimens (types and time points) from the same patient may be necessary to detect the virus. Improper specimen collection and handling, sequence variability underlying assay primers and or probes, or the presence of organisms in  quantities less than the limit of detection of the assay may lead to false negative results. Positive and negative predictive values of testing are highly dependent on prevalence. False negative results are more likely when prevalence of disease is high. (NOTE) The expected result is Negative (Not Detected). The SARS CoV 2 test is intended for the presumptive qualitative  detection of nucleic acid from SARS CoV 2 in upper and lower  respir atory specimens. Testing methodology is  real time RT PCR. Test results must be correlated with clinical presentation and  evaluated in the context of other laboratory and epidemiologic data.  Test performance can be affected because the epidemiology and  clinical spectrum of infection caused by SARS CoV 2 is not fully  known. For example, the optimum types of specimens to collect and  when during the course of infection these specimens are most likely  to contain detectable viral RNA may not be known. This test has not been Food and Drug Administration (FDA) cleared or  approved and  has been authorized by FDA under an Emergency Use  Authorization (EUA). The test is only authorized for the duration of  the declaration that circumstances exist justifying the authorization  of emergency use of in vitro diagnostic tests for detection and or  diagnosis of SARS CoV 2 under Section 564(b)(1) of the Act, 21 U.S.C.  section 401 225 8066 3(b)(1), unless the authorization is terminated or   revoked sooner. [facility] is certified under the  Clinical Laboratory Improvement Amendments of 1988 (CLIA), 42 U.S.C.  section [phone], to perform high complexity tests. Performed at [facility] [id number], [place], [place] [address] Laboratory Director: Loleta Books, MD Fact Sheet for Healthcare Providers  BankingDealers.co.za Fact Sheet for Patients  StrictlyIdeas.no Performed at [facility], [place] [address] [place], [place] [address]    Coronavirus Source NASOPHARYNGEAL  Final    Comment: Performed at [facility], [address] [place], [place] [address]  Respiratory Panel by PCR     Status: None   Collection Time: 07/30/18  2:16 PM  Result Value Ref Range Status   Adenovirus NOT DETECTED NOT DETECTED Final   Coronavirus 229E NOT DETECTED NOT DETECTED Final    Comment: (NOTE) The Coronavirus on the Respiratory Panel,  DOES NOT test for the novel  Coronavirus (2019 nCoV)    Coronavirus HKU1 NOT DETECTED NOT DETECTED Final   Coronavirus NL63 NOT DETECTED NOT DETECTED Final   Coronavirus OC43 NOT DETECTED NOT DETECTED Final   Metapneumovirus NOT DETECTED NOT DETECTED Final   Rhinovirus / Enterovirus NOT DETECTED NOT DETECTED Final   Influenza A NOT DETECTED NOT DETECTED Final   Influenza B NOT DETECTED NOT DETECTED Final   Parainfluenza Virus 1 NOT DETECTED NOT DETECTED Final   Parainfluenza Virus 2 NOT DETECTED NOT DETECTED Final   Parainfluenza Virus 3 NOT DETECTED NOT DETECTED Final   Parainfluenza Virus 4 NOT DETECTED NOT DETECTED Final   Respiratory Syncytial Virus NOT DETECTED NOT DETECTED Final   Bordetella pertussis NOT DETECTED NOT DETECTED Final   Chlamydophila pneumoniae NOT DETECTED NOT DETECTED Final   Mycoplasma pneumoniae NOT DETECTED NOT DETECTED Final    Comment: Performed at [facility], [place] [address], [place], [place] [address]  MRSA PCR Screening     Status: None   Collection Time: 08/01/18 11:57 PM  Result Value Ref Range Status   MRSA by PCR NEGATIVE NEGATIVE Final    Comment:        The GeneXpert MRSA Assay (FDA approved for NASAL specimens only), is one component of a comprehensive MRSA colonization surveillance program. It is not intended to diagnose MRSA infection nor to guide or monitor treatment for MRSA infections. Performed at [facility], [place] [address], [place], [place] [address]     Radiology Reports Dg Chest Port 1 View  Result Date: 08/06/2018 CLINICAL DATA:  Evaluate pneumothorax.  COVID-19 pneumonia. EXAM: PORTABLE CHEST 1 VIEW COMPARISON:  08/04/2018 FINDINGS: Findings right internal jugular line tip at low SVC. Removal of left chest tube, without pneumothorax. Mild cardiomegaly. Atherosclerosis in the transverse aorta. No pleural fluid. Left greater than right and basilar predominant interstitial opacities are similar, given  differences in technique. IMPRESSION: No significant change in left greater than right interstitial opacities, consistent with pneumonia. Removal of left chest tube, without pneumothorax or pleural fluid. Aortic Atherosclerosis (ICD10-I70.0). Electronically Signed   By: Abigail Miyamoto M.D.   On: 08/06/2018 08:51  Dg Chest Port 1 View  Result Date: 08/04/2018 CLINICAL DATA:  Respiratory failure EXAM: PORTABLE CHEST 1 VIEW COMPARISON:  08/04/2018 FINDINGS: Left chest tube is stable. Left jugular central venous catheter is stable with its tip at the cavoatrial junction. Extensive bilateral airspace disease is not significantly changed. No pneumothorax. IMPRESSION: Stable extensive bilateral airspace disease. Stable left chest tube without pneumothorax. Electronically Signed   By: Marybelle Killings M.D.   On: 08/04/2018 11:07   Dg Chest Port 1 View  Result Date: 08/04/2018 CLINICAL DATA:  Respiratory failure EXAM: PORTABLE CHEST 1 VIEW COMPARISON:  08/03/2018 FINDINGS: Pigtail chest tube on the left unchanged. No pneumothorax. Left jugular central venous catheter tip SVC/RA junction unchanged. Diffuse bilateral airspace disease left greater than right appears unchanged. Small left effusion unchanged. Underlying COPD. IMPRESSION: Left chest tube in place no pneumothorax Diffuse bilateral airspace disease left greater than right unchanged. Electronically Signed   By: Franchot Gallo M.D.   On: 08/04/2018 07:56   Dg Chest Port 1 View  Result Date: 08/03/2018 CLINICAL DATA:  Status post extubation today. EXAM: PORTABLE CHEST 1 VIEW COMPARISON:  Single-view of the chest 08/01/2018. FINDINGS: ETT and NG tube been removed. Left IJ catheter is unchanged. Left chest tube has backed out slightly. Lung volumes are low. Coarsening of the pulmonary interstitium is worse on the left unchanged no pneumothorax or pleural effusion. Heart size is normal. Aortic atherosclerosis is noted. IMPRESSION: Status post extubation and NG tube  removal. The patient's left chest tube has backed out slightly. No pneumothorax. Coarsening of the interstitial is much worse on the left and unchanged. Electronically Signed   By: Inge Rise M.D.   On: 08/03/2018 12:53   Dg Chest Port 1 View  Result Date: 08/01/2018 CLINICAL DATA:  Pneumothorax. Chest tube placement. EXAM: PORTABLE CHEST 1 VIEW COMPARISON:  Earlier this day at 1749 hour, radiograph 07/30/2018 FINDINGS: Placement of left pigtail catheter with tip near the apex. Left pneumothorax is significantly decreased in size and near completely resolved. Possible tiny lucency remains at the left lung apex and laterally. Endotracheal tube, enteric tube, and left central line remain in place. Diffuse bilateral interstitial coarsening and patchy opacities throughout the left lung. IMPRESSION: Placement of left pigtail catheter with significantly decreased size of left pneumothorax. Possible tiny residual remains at the left lung apex and laterally. Diffuse bilateral interstitial opacities patchy opacities throughout the left lung, with some improvement from 07/30/2018 radiograph. Electronically Signed   By: Keith Rake M.D.   On: 08/01/2018 20:36   Dg Chest Port 1 View  Result Date: 08/01/2018 CLINICAL DATA:  Endotracheal tube placement. Central line placement. EXAM: PORTABLE CHEST 1 VIEW COMPARISON:  07/30/2018 FINDINGS: 1749 hours. Endotracheal tube tip is 3.7 cm above the base of the carina. The NG tube passes into the stomach although the distal tip position is not included on the film. Left IJ central line tip overlies the mid to distal SVC level. Underlying diffuse interstitial opacity again noted with apparent improvement in airspace disease and better lung aeration. Patient is noted to have a left pneumothorax, of moderate to large size. Telemetry leads overlie the chest. IMPRESSION: Moderate to large left pneumothorax. Diffuse underlying interstitial opacity in both lungs with some  improvement in the airspace disease seen previously. Endotracheal tube, left IJ central line, and NG tube are new in the interval. I called the results of this study to the charge nurse, Zoe, and the clinical team was already aware of the pneumothorax she  reported that a left chest tube has already been placed. Electronically Signed   By: Misty Stanley M.D.   On: 08/01/2018 19:10   Dg Chest Port 1 View  Result Date: 07/30/2018 CLINICAL DATA:  Sob and cough x 1 week. No fever at this time. EXAM: PORTABLE CHEST 1 VIEW COMPARISON:  01/09/2014 FINDINGS: There are bilateral interstitial and airspace opacities, with airspace opacity most prominent in the peripheral left lung. There are additional chronic areas of scarring in both upper lobes stable from the prior exam. No convincing pleural effusion.  No pneumothorax. Cardiac silhouette is normal in size. No mediastinal or hilar masses. Skeletal structures are grossly intact. IMPRESSION: 1. Bilateral, left greater than right, interstitial and airspace lung opacities consistent with multifocal pneumonia. Electronically Signed   By: Lajean Manes M.D.   On: 07/30/2018 12:55      Phillips Climes M.D on 08/06/2018 at 3:21 PM  Between 7am to 7pm - Pager - [phone]  After 7pm go to [email or web address] - password [email or web address]  [facility] -  Office  [phone]

## 2018-08-07 LAB — COMPREHENSIVE METABOLIC PANEL
ALT: 32 U/L (ref 0–44)
AST: 24 U/L (ref 15–41)
Albumin: 2.8 g/dL — ABNORMAL LOW (ref 3.5–5.0)
Alkaline Phosphatase: 55 U/L (ref 38–126)
Anion gap: 11 (ref 5–15)
BUN: 19 mg/dL (ref 8–23)
CO2: 27 mmol/L (ref 22–32)
Calcium: 9.2 mg/dL (ref 8.9–10.3)
Chloride: 101 mmol/L (ref 98–111)
Creatinine, Ser: 0.99 mg/dL (ref 0.61–1.24)
GFR calc Af Amer: >60 mL/min (ref >60)
GFR calc non Af Amer: >60 mL/min (ref >60)
Glucose, Bld: 81 mg/dL (ref 70–99)
Potassium: 4.1 mmol/L (ref 3.5–5.1)
Sodium: 139 mmol/L (ref 135–145)
Total Bilirubin: 0.6 mg/dL (ref 0.3–1.2)
Total Protein: 6.5 g/dL (ref 6.5–8.1)

## 2018-08-07 LAB — CBC
HCT: 35.2 % — ABNORMAL LOW (ref 39.0–52.0)
Hemoglobin: 11.2 g/dL — ABNORMAL LOW (ref 13.0–17.0)
MCH: 29.6 pg (ref 26.0–34.0)
MCHC: 31.8 g/dL (ref 30.0–36.0)
MCV: 93.1 fL (ref 80.0–100.0)
Platelets: 361 10*3/uL (ref 150–400)
RBC: 3.78 MIL/uL — ABNORMAL LOW (ref 4.22–5.81)
RDW: 16.6 % — ABNORMAL HIGH (ref 11.5–15.5)
WBC: 12.1 10*3/uL — ABNORMAL HIGH (ref 4.0–10.5)
nRBC: 0 % (ref 0.0–0.2)

## 2018-08-07 LAB — LACTATE DEHYDROGENASE: LDH: 237 U/L — ABNORMAL HIGH (ref 98–192)

## 2018-08-07 LAB — D-DIMER, QUANTITATIVE (NOT AT ARMC): D-Dimer, Quant: 2.94 ug/mL-FEU — ABNORMAL HIGH (ref 0.00–0.50)

## 2018-08-07 LAB — PROCALCITONIN: Procalcitonin: <0.1 ng/mL

## 2018-08-07 LAB — C-REACTIVE PROTEIN: CRP: 8.6 mg/dL — ABNORMAL HIGH (ref <1.0)

## 2018-08-07 LAB — FERRITIN: Ferritin: 276 ng/mL (ref 24–336)

## 2018-08-07 MED ORDER — FUROSEMIDE 10 MG/ML IJ SOLN
60.0000 mg | Freq: Every day | INTRAMUSCULAR | Status: DC
  Administered 2018-08-07 – 2018-08-12 (×6): 60 mg via INTRAVENOUS
  Filled 2018-08-07 (×5): qty 6

## 2018-08-07 MED ORDER — ENSURE ENLIVE PO LIQD
237.0000 mL | ORAL | Status: DC
  Administered 2018-08-07 – 2018-08-14 (×6): 237 mL via ORAL

## 2018-08-07 MED ORDER — METHYLPREDNISOLONE SODIUM SUCC 40 MG IJ SOLR: 40.0000 mg | Freq: Three times a day (TID) | INTRAMUSCULAR | Status: DC

## 2018-08-07 MED ORDER — ADULT MULTIVITAMIN W/MINERALS CH
1.0000 | ORAL_TABLET | Freq: Every day | ORAL | Status: DC
  Administered 2018-08-07 – 2018-08-15 (×9): 1 via ORAL
  Filled 2018-08-07 (×9): qty 1

## 2018-08-07 NOTE — Progress Notes (Addendum)
Nutrition Follow-up  RD working remotely.   DOCUMENTATION CODES:   Not applicable  INTERVENTION:  - will order Ensure Enlive once/day, each supplement provides 350 kcal and 20 grams of protein. - will order daily multivitamin with minerals. - continue to encourage PO intakes.    NUTRITION DIAGNOSIS:   Increased nutrient needs related to acute illness, chronic illness as evidenced by estimated needs. -revised  GOAL:   Patient will meet greater than or equal to 90% of their needs -unmet at this time  MONITOR:   PO intake, Supplement acceptance, Labs, Weight trends, Skin  ASSESSMENT:   73 y.o. male with a past medical history significant for anxiety/depression, asthma, GERD, HTN, hyperlipidemia, history of prostate cancer (currently receiving Lupron) and rheumatoid arthritis (chronically on a steroids and methotrexate). He presented to the ED due to general malaise, chills, SOB, and cough x1 week. Patient denied fever, chest pain, N/V, hemoptysis, melena, hematochezia, or focal weakness. He reported that he had been around a friend who was sick ~1 week ago. Due to ongoing symptoms he was evaluated through telemedicine by PCP and advised to go to the ED for COVID-19 rule out and further evaluation and management. In the ED, CXR demonstrated multifocal pneumonia with perihilar infiltrate and he had a temperature of 99.5 degrees. Influenza follow-up PCR was negative.  Weight trending down from admission weight. Re-estimated nutrition needs this AM based on current weight (67.1 kg) as patient was extubated on 4/10 at ~11:40 AM. Diet advanced from NPO to CLD on 4/10 at 3:30 PM and then to Soft on 4/11 at 1:00 PM. Per review of RN flow sheet, patient consumed 75% of lunch and 100% of dinner on 4/11; no other intakes documented since that date.   Per Dr. Graciela Valdez note yesterday: hypoxic respiratory failure 2/2 multifocal PNA requiring intubation and now s/p extubation, requiring 15L via  HFNC, sister reported hx of TB and asbestosis exposure, L pneumothorax s/p chest tube placement and subsequent removal with CXR repeated 4/13 with no indication of recurrence, hypokalemia with repletion ordered. Note states that COVID test negative but plan to keep patient on airborne/contact precautions d/t current presentation.     Medications reviewed; 1 mg folvite/day, 60 mg IV lasix/day, 40 mEq K-Dur x1 dose 4/13, 10 mg deltasone/day, 500 mg ascorbic acid BID, 220 mg zinc sulfate/day.  Labs reviewed.     NUTRITION - FOCUSED PHYSICAL EXAM:  unable to complete at this time.  Diet Order:   Diet Order            DIET SOFT Room service appropriate? Yes; Fluid consistency: Thin  Diet effective now              EDUCATION NEEDS:   Not appropriate for education at this time  Skin:  Skin Assessment: Skin Integrity Issues: Skin Integrity Issues:: Incisions Incisions: L flank (4/11)  Last BM:  4/13  Height:   Ht Readings from Last 1 Encounters:  08/01/18 5\' 6"  (1.676 m)    Weight:   Wt Readings from Last 1 Encounters:  08/07/18 67.1 kg    Ideal Body Weight:  64.54 kg  BMI:  Body mass index is 23.88 kg/m.  Estimated Nutritional Needs:   Kcal:  2015-2215 kcal  Protein:  85-100 grams  Fluid:  >/= 1.8 L/day     Joel Matin, MS, RD, LDN, [facility] Inpatient Clinical Dietitian Pager # [phone] After hours/weekend pager # [phone]

## 2018-08-07 NOTE — Progress Notes (Signed)
Physical Therapy Treatment Patient Details Name: Joel Valdez. Ku MRN: [id number] DOB: 1945-06-05 Today's Date: 08/07/2018    History of Present Illness Pt admitted 2* SOB with acute respiratory failure and intubated 07/31/08 and extubated 08/02/08.  Pt  with hx of RA, asthma, anxiety/depression, and deafness R ear    PT Comments    The  Patient reports having a HA and feeling poorly. Placed patient on NRB mask per RN recommendation. O2 sats dropped to 80% , HR 129, RR 44. Patient  Very limited with mobility.   Follow Up Recommendations  SNF     Equipment Recommendations  None recommended by PT    Recommendations for Other Services       Precautions / Restrictions Precautions Precautions: Fall Precaution Comments: ask RN to place on NRB for mobility, watch sats, drops O2 Restrictions Weight Bearing Restrictions: No    Mobility  Bed Mobility               General bed mobility comments: OOB  Transfers Overall transfer level: Needs assistance Equipment used: Rolling walker (2 wheeled) Transfers: Sit to/from Stand Sit to Stand: Min guard         General transfer comment: steady assist to stand  Ambulation/Gait Ambulation/Gait assistance: Min assist Gait Distance (Feet): 5 Feet Assistive device: Rolling walker (2 wheeled) Gait Pattern/deviations: Step-through pattern     General Gait Details: ambulated forward and back x 5' as far as lines could go. Patient with increased HR 129 and RR 44. Assisted back into recliner.   Stairs             Wheelchair Mobility    Modified Rankin (Stroke Patients Only)       Balance                                            Cognition Arousal/Alertness: Awake/alert Behavior During Therapy: WFL for tasks assessed/performed Overall Cognitive Status: Within Functional Limits for tasks assessed                                        Exercises      General Comments General  comments (skin integrity, edema, etc.): put on NRB for activity.   RR to 40; Sats down to 80 on NRB and HR up to 129      Pertinent Vitals/Pain Pain Assessment: Faces Faces Pain Scale: Hurts even more Pain Location: headache Pain Descriptors / Indicators: Aching Pain Intervention(s): Monitored during session;Patient requesting pain meds-RN notified    Home Living                      Prior Function            PT Goals (current goals can now be found in the care plan section) Progress towards PT goals: Not progressing toward goals - comment    Frequency    Min 3X/week      PT Plan Current plan remains appropriate    Co-evaluation PT/OT/SLP Co-Evaluation/Treatment: Yes Reason for Co-Treatment: For patient/therapist safety PT goals addressed during session: Mobility/safety with mobility OT goals addressed during session: Strengthening/ROM      AM-PAC PT "6 Clicks" Mobility   Outcome Measure  Help needed turning from your back to your side while in  a flat bed without using bedrails?: A Little Help needed moving from lying on your back to sitting on the side of a flat bed without using bedrails?: A Little Help needed moving to and from a bed to a chair (including a wheelchair)?: A Lot Help needed standing up from a chair using your arms (e.g., wheelchair or bedside chair)?: A Lot Help needed to walk in hospital room?: A Lot Help needed climbing 3-5 steps with a railing? : Total 6 Click Score: 13    End of Session Equipment Utilized During Treatment: Oxygen(NRB mask) Activity Tolerance: Treatment limited secondary to medical complications (Comment) Patient left: in chair;with call bell/phone within reach;with chair alarm set Nurse Communication: Mobility status(decr. sats and incr. HR/RR) PT Visit Diagnosis: Difficulty in walking, not elsewhere classified (R26.2);Muscle weakness (generalized) (M62.81)     Time: 8329-1916 PT Time Calculation (min) (ACUTE  ONLY): 36 min  Charges:  $Gait Training: 8-22 mins                     Joel Valdez Pager [phone] Office [phone]    Joel Valdez 08/07/2018, 2:04 PM

## 2018-08-07 NOTE — Progress Notes (Signed)
Occupational Therapy Treatment Patient Details Name: Joel Valdez. Fiallos MRN: [id number] DOB: 03/01/1946 Today's Date: 08/07/2018    History of present illness Pt admitted 2* SOB with acute respiratory failure and intubated 07/31/08 and extubated 08/02/08.  Pt  with hx of RA, asthma, anxiety/depression, and deafness R ear   OT comments  Pt generally didn't feel well today.  Got up and walked to sink; used NRB.  Follow Up Recommendations  Supervision/Assistance - 24 hour    Equipment Recommendations  3 in 1 bedside commode    Recommendations for Other Services      Precautions / Restrictions Precautions Precautions: Fall Precaution Comments: ask RN to place on NRB for mobility, watch sats, drops O2 Restrictions Weight Bearing Restrictions: No       Mobility Bed Mobility                  Transfers   Equipment used: Rolling walker (2 wheeled)   Sit to Stand: Min assist         General transfer comment: assist to steady, encouraged rest breaks, concentrate on breaths    Balance                                           ADL either performed or assessed with clinical judgement   ADL                                         General ADL Comments: Pt generally doesn't feel well today. Ambulated to sink and stood for several minutes but did not complete adl tasks.  Switched to [employer]      Cognition Arousal/Alertness: Awake/alert Behavior During Therapy: WFL for tasks assessed/performed Overall Cognitive Status: Within Functional Limits for tasks assessed                                          Exercises     Shoulder Instructions       General Comments put on NRB for activity.  Sats 90s but RR and HR increased    Pertinent Vitals/ Pain       Pain Assessment: Faces Faces Pain Scale: Hurts even more Pain Location: headache Pain Intervention(s): Limited activity  within patient's tolerance;Monitored during session;Patient requesting pain meds-RN notified  Home Living                                          Prior Functioning/Environment              Frequency  Min 2X/week        Progress Toward Goals  OT Goals(current goals can now be found in the care plan section)  Progress towards OT goals: (slow progress)     Plan      Co-evaluation    PT/OT/SLP Co-Evaluation/Treatment: Yes Reason for Co-Treatment: For patient/therapist safety PT goals addressed during session: Mobility/safety with mobility OT goals addressed during session: Strengthening/ROM      AM-PAC OT "6 Clicks"  Daily Activity     Outcome Measure   Help from another person eating meals?: None Help from another person taking care of personal grooming?: A Little Help from another person toileting, which includes using toliet, bedpan, or urinal?: A Lot Help from another person bathing (including washing, rinsing, drying)?: A Lot Help from another person to put on and taking off regular upper body clothing?: A Lot Help from another person to put on and taking off regular lower body clothing?: Total 6 Click Score: 14    End of Session    OT Visit Diagnosis: Unsteadiness on feet (R26.81);Muscle weakness (generalized) (M62.81)   Activity Tolerance Patient limited by fatigue   Patient Left in chair;with call bell/phone within reach;with nursing/sitter in room   Nurse Communication          Time: 2446-2863 OT Time Calculation (min): 32 min  Charges: OT General Charges $OT Visit: 1 Visit OT Treatments $Therapeutic Activity: 8-22 mins  Lesle Chris, OTR/L Acute Rehabilitation Services [phone] [place] pager [phone] office 08/07/2018   [place] 08/07/2018, 12:17 PM

## 2018-08-07 NOTE — Progress Notes (Signed)
PROGRESS NOTE                                                                                                                                                                                                             Patient Demographics:    Joel Valdez, is a 73 y.o. male, DOB - 04-Jul-1945, JGO:[id number]  Admit date - 07/30/2018   Admitting Physician No admitting provider for patient encounter.  Outpatient Primary MD for the patient is Delorse Limber  LOS - 8   Chief Complaint  Patient presents with   Shortness of Breath       Brief Narrative   73 year old male with multiple comorbidities and immunosuppression which includes prostate cancer rheumatoid arthritis, chronic steroid and methotrexate therapy, gastroesophageal reflux essential hypertension glaucoma prostate cancer, currently receiving Lupron injection, depression.  Who was admitted with respiratory failure with bilateral infiltrate  pneumonia status post intubation extubated August 03, 2018, as well patient had pneumothorax, will require chest tube, discontinued on 08/04/2018, remains on 15 L high flow nasal cannula this morning.    Subjective:    Joel Valdez today still reports some dyspnea, weakness, no chest pain or fever .   Assessment  & Plan :    Principal Problem:   Pneumonia due to COVID-19 virus Active Problems:   Glaucoma   Essential hypertension, benign   Prostate cancer ([place])   Depression   Rheumatoid arthritis involving both hands ([place])   Essential hypertension   Acute Hypoxic respiratory failure  -Secondary to multifocal pneumonia, required intubation 08/01/2018, extubated 14/01/2019  -Remains profoundly hypoxic, requiring 15 high flow nasal cannula  -Was encouraged to use incentive spirometry again, he was set by PT today, and as well discussed with staff on the patient, he will try to do more prone today . -I have discussed with family, reports brother  has history of tuberculosis which was treated, as well he had history of asbestosis. -Patient remains at high risk for reintubation -Continue with IV diuresis assist with decrease oxygen requirement  Multifocal pneumonia -COVID 19 is negative, I have discussed with ID, and clinical scenario highly suspicious for COVID 19 infection, and his test is likely false negative -Treated with Plaquenil -Being treated with IV Rocephin and azithromycin. -Procalcitonin is low, CRP trending down, dimer is elevated at 2.94, continue to monitor closely -  Continue with zinc and vitamin C  Left pneumothorax -Chest tube removed, repeat x-ray today with no evidence of recurrence of pneumothorax  Rheumatoid arthritis  -Continue with prednisone, resume methotrexate once stable as an outpatient setting, continue to hold during hospital stay   Hypertension - BP stable without any medications, will start on PRN hydralazine  Depression - continue with lexapro  Hyperlipidemia - continue with statine  Glaucoma - continue with home meds  Hypokalemia - Repleted  History of prostate cancer - on  Lupron she is currently on hold for right now  Code Status : Partial   Family Communication  : discussed with wife via phone today, D/W with sister via phone yesterday  Disposition Plan  : remains in ICU  Consults  :  PCCM  Procedures  :  4/8 ET>>4/10 4/8 lijcvl>> 4/8 left ct>>4/11   DVT Prophylaxis  : [place] lovenox  Lab Results  Component Value Date   PLT 361 08/07/2018    Antibiotics  :    Anti-infectives (From admission, onward)   Start     Dose/Rate Route Frequency Ordered Stop   08/02/18 0800  valACYclovir (VALTREX) tablet 1,000 mg  Status:  Discontinued     1,000 mg Per Tube 3 times daily 08/01/18 2049 08/03/18 1222   08/02/18 0800  hydroxychloroquine (PLAQUENIL) tablet 200 mg     200 mg Per Tube 2 times daily 08/01/18 2054 08/04/18 1115   07/31/18 2000  hydroxychloroquine (PLAQUENIL)  tablet 200 mg  Status:  Discontinued     200 mg Oral 2 times daily 07/30/18 1803 08/01/18 2054   07/31/18 1400  azithromycin (ZITHROMAX) 500 mg in sodium chloride 0.9 % 250 mL IVPB     500 mg 250 mL/hr over 60 Minutes Intravenous Every 24 hours 07/30/18 1400     07/31/18 1000  cefTRIAXone (ROCEPHIN) 1 g in sodium chloride 0.9 % 100 mL IVPB     1 g 200 mL/hr over 30 Minutes Intravenous Every 24 hours 07/31/18 0827     07/30/18 2000  hydroxychloroquine (PLAQUENIL) tablet 400 mg     400 mg Oral 2 times daily 07/30/18 1803 07/31/18 0748   07/30/18 1600  valACYclovir (VALTREX) tablet 1,000 mg  Status:  Discontinued     1,000 mg Oral 3 times daily 07/30/18 1404 08/01/18 2049   07/30/18 1415  hydroxychloroquine (PLAQUENIL) tablet 200 mg  Status:  Discontinued     200 mg Oral 2 times daily 07/30/18 1400 07/30/18 1803   07/30/18 1330  levofloxacin (LEVAQUIN) IVPB 750 mg  Status:  Discontinued     750 mg 100 mL/hr over 90 Minutes Intravenous Every 24 hours 07/30/18 1316 07/30/18 1407        Objective:   Vitals:   08/07/18 0900 08/07/18 0911 08/07/18 1000 08/07/18 1100  BP: 136/77     Pulse: 83  (!) 101 (!) 101  Resp: (!) 24  20 (!) 33  Temp:      TempSrc:      SpO2: 97% 97% 98% 98%  Weight:      Height:        Wt Readings from Last 3 Encounters:  08/07/18 67.1 kg  06/05/18 76.7 kg  05/22/18 75.3 kg     Intake/Output Summary (Last 24 hours) at 08/07/2018 1139 Last data filed at 08/07/2018 1000 Gross per 24 hour  Intake 892.46 ml  Output 2600 ml  Net -1707.54 ml     Physical Exam  Awake Alert, Oriented X 3, extremely  frail and sick appearing Symmetrical Chest wall movement, some use of accessory muscles, diminished air movement bilaterally, with scattered rails RRR,No Gallops,Rubs or new Murmurs, No Parasternal Heave +ve B.Sounds, Abd Soft, No tenderness, No rebound - guarding or rigidity. No Cyanosis, Clubbing or edema, No new Rash or bruise      Data Review:     CBC Recent Labs  Lab 08/01/18 0318 08/02/18 0350 08/03/18 0500 08/04/18 0305 08/05/18 0502 08/07/18 0409  WBC 11.5* 18.9* 12.0* 12.7* 9.6 12.1*  HGB 10.5* 11.3* 9.8* 9.8* 9.7* 11.2*  HCT 33.1* 35.8* 31.6* 31.7* 30.8* 35.2*  PLT 322 421* 302 313 238 361  MCV 91.9 93.2 95.2 94.1 93.1 93.1  MCH 29.2 29.4 29.5 29.1 29.3 29.6  MCHC 31.7 31.6 31.0 30.9 31.5 31.8  RDW 16.1* 16.3* 16.5* 15.9* 16.1* 16.6*  LYMPHSABS 1.2 2.6 1.2 1.8 1.5  --   MONOABS 0.9 1.6* 1.3* 1.2* 0.8  --   EOSABS 0.1 0.2 0.5 0.3 0.5  --   BASOSABS 0.0 0.1 0.1 0.1 0.1  --     Chemistries  Recent Labs  Lab 08/01/18 0318 08/02/18 0350 08/03/18 0500 08/04/18 0305 08/05/18 0502 08/07/18 0409  NA 136 135 137 135 138 139  K 3.7 3.4* 5.8* 3.8 3.8 4.1  CL 108 103 108 104 104 101  CO2 20* 21* '23 22 26 27  ' GLUCOSE 108* 103* 93 90 90 81  BUN 19 26* 31* 25* 21 19  CREATININE 1.01 1.48* 1.19 1.05 0.97 0.99  CALCIUM 8.6* 8.8* 8.4* 8.8* 8.6* 9.2  MG 2.2 2.1 2.3 2.1 2.1  --   AST 13* 13* 21 14*  --  24  ALT '20 20 9 15  ' --  32  ALKPHOS 47 51 40 43  --  55  BILITOT 0.6 0.6 1.3* 0.4  --  0.6   ------------------------------------------------------------------------------------------------------------------ No results for input(s): CHOL, HDL, LDLCALC, TRIG, CHOLHDL, LDLDIRECT in the last 72 hours.  Lab Results  Component Value Date   HGBA1C 5.8 04/10/2017   ------------------------------------------------------------------------------------------------------------------ No results for input(s): TSH, T4TOTAL, T3FREE, THYROIDAB in the last 72 hours.  Invalid input(s): FREET3 ------------------------------------------------------------------------------------------------------------------ Recent Labs    08/07/18 0409  FERRITIN 276    Coagulation profile No results for input(s): INR, PROTIME in the last 168 hours.  Recent Labs    08/07/18 0409  DDIMER 2.94*    Cardiac Enzymes No results for  input(s): CKMB, TROPONINI, MYOGLOBIN in the last 168 hours.  Invalid input(s): CK ------------------------------------------------------------------------------------------------------------------    Component Value Date/Time   BNP 94.0 07/30/2018 1221    Inpatient Medications  Scheduled Meds:  atorvastatin  10 mg Per Tube QHS   chlorhexidine gluconate (MEDLINE KIT)  15 mL Mouth Rinse BID   Chlorhexidine Gluconate Cloth  6 each Topical Daily   enoxaparin (LOVENOX) injection  40 mg Subcutaneous Q24H   escitalopram  10 mg Oral Daily   feeding supplement (ENSURE ENLIVE)  237 mL Oral U20U   folic acid  1 mg Oral Daily   furosemide  60 mg Intravenous Daily   guaiFENesin  1,200 mg Oral BID   Ipratropium-Albuterol  2 puff Inhalation Q6H   latanoprost  1 drop Both Eyes QHS   mouth rinse  15 mL Mouth Rinse BID   mometasone-formoterol  2 puff Inhalation BID   multivitamin with minerals  1 tablet Oral Daily   pantoprazole  40 mg Oral Daily   predniSONE  10 mg Oral Daily   sodium chloride flush  10-40  mL Intracatheter Q12H   timolol  1 drop Both Eyes Daily   vitamin C  500 mg Oral BID   zinc sulfate  220 mg Oral Daily   Continuous Infusions:  sodium chloride Stopped (08/05/18 3536)   azithromycin Stopped (08/06/18 1343)   cefTRIAXone (ROCEPHIN)  IV Stopped (08/07/18 0933)   PRN Meds:.sodium chloride, acetaminophen (TYLENOL) oral liquid 160 mg/5 mL, chlorpheniramine-HYDROcodone, hydrALAZINE, ondansetron **OR** ondansetron (ZOFRAN) IV, sodium chloride flush, traMADol  Micro Results Recent Results (from the past 240 hour(s))  Culture, blood (routine x 2)     Status: None   Collection Time: 07/30/18 12:21 PM  Result Value Ref Range Status   Specimen Description BLOOD RIGHT ANTECUBITAL DRAWN BY RN  Final   Special Requests   Final    BOTTLES DRAWN AEROBIC AND ANAEROBIC Blood Culture adequate volume   Culture   Final    NO GROWTH 5 DAYS Performed at [facility], [address], [place], [place] [address]    Report Status 08/04/2018 FINAL  Final  Urine culture     Status: None   Collection Time: 07/30/18 12:24 PM  Result Value Ref Range Status   Specimen Description   Final    URINE, RANDOM Performed at [facility], [address], [place], [place] [address]    Special Requests   Final    NONE Performed at [facility], [address] [place], [place] [address]    Culture   Final    NO GROWTH Performed at [facility], [place] [address], [place], [place] [address]    Report Status 07/31/2018 FINAL  Final  Culture, blood (routine x 2)     Status: None   Collection Time: 07/30/18  1:00 PM  Result Value Ref Range Status   Specimen Description BLOOD LEFT ANTECUBITAL  Final   Special Requests   Final    BOTTLES DRAWN AEROBIC AND ANAEROBIC Blood Culture adequate volume   Culture   Final    NO GROWTH 5 DAYS Performed at [facility], [address], [place],  [address]    Report Status 08/04/2018 FINAL  Final  Novel Coronavirus, NAA (hospital order; send-out to ref lab)     Status: None   Collection Time: 07/30/18  2:16 PM  Result Value Ref Range Status   SARS-CoV-2, NAA NOT DETECTED NOT DETECTED Final    Comment: Negative (Not Detected) results do not exclude infection caused by SARS CoV 2 and should not be used as the sole basis for treatment or other patient management decisions. Optimum specimen types and timing for peak viral levels during infections caused  by SARS CoV 2 have not been determined. Collection of multiple specimens (types and time points) from the same patient may be necessary to detect the virus. Improper specimen collection and handling, sequence variability underlying assay primers and or probes, or the presence of organisms in  quantities less than the limit of detection of the assay may lead to false negative results. Positive and negative predictive values of testing are highly dependent on prevalence. False  negative results are more likely when prevalence of disease is high. (NOTE) The expected result is Negative (Not Detected). The SARS CoV 2 test is intended for the presumptive qualitative  detection of nucleic acid from SARS CoV 2 in upper and lower  respir atory specimens. Testing methodology is real time RT PCR. Test results must be correlated with clinical presentation and  evaluated in the context of other laboratory  and epidemiologic data.  Test performance can be affected because the epidemiology and  clinical spectrum of infection caused by SARS CoV 2 is not fully  known. For example, the optimum types of specimens to collect and  when during the course of infection these specimens are most likely  to contain detectable viral RNA may not be known. This test has not been Food and Drug Administration (FDA) cleared or  approved and has been authorized by FDA under an Emergency Use  Authorization (EUA). The test is only authorized for the duration of  the declaration that circumstances exist justifying the authorization  of emergency use of in vitro diagnostic tests for detection and or  diagnosis of SARS CoV 2 under Section 564(b)(1) of the Act, 21 U.S.C.  section [phone] 3(b)(1), unless the authorization is terminated or   revoked sooner. [facility] is certified under the  Clinical Laboratory Improvement Amendments of 1988 (CLIA), 42 U.S.C.  section [phone], to perform high complexity tests. Performed at [id number] [address], [place], [place] [address] Laboratory Director: Loleta Books, MD Fact Sheet for Healthcare Providers  BankingDealers.co.za Fact Sheet for Patients  StrictlyIdeas.no Performed at [facility], [place] [address] [place], [place] [address]    Coronavirus Source NASOPHARYNGEAL  Final    Comment: Performed at [facility], [address] [place], [place] [address]  Respiratory Panel by PCR     Status: None   Collection Time: 07/30/18  2:16 PM  Result Value Ref Range Status   Adenovirus NOT DETECTED NOT DETECTED Final   Coronavirus 229E NOT DETECTED NOT DETECTED Final    Comment: (NOTE) The Coronavirus on the Respiratory Panel, DOES NOT test for the novel  Coronavirus (2019 nCoV)    Coronavirus HKU1 NOT DETECTED NOT DETECTED Final   Coronavirus NL63 NOT DETECTED NOT DETECTED Final   Coronavirus OC43 NOT DETECTED NOT DETECTED Final   Metapneumovirus NOT DETECTED NOT DETECTED Final   Rhinovirus / Enterovirus NOT DETECTED NOT DETECTED Final   Influenza A NOT DETECTED NOT DETECTED Final   Influenza B NOT DETECTED NOT DETECTED Final   Parainfluenza Virus 1 NOT DETECTED NOT DETECTED Final   Parainfluenza Virus 2 NOT DETECTED NOT DETECTED Final   Parainfluenza Virus 3 NOT DETECTED NOT DETECTED Final   Parainfluenza Virus 4 NOT DETECTED NOT DETECTED Final   Respiratory Syncytial Virus NOT DETECTED NOT DETECTED Final   Bordetella pertussis NOT DETECTED NOT DETECTED Final   Chlamydophila pneumoniae NOT DETECTED NOT DETECTED Final   Mycoplasma pneumoniae NOT DETECTED NOT DETECTED Final    Comment: Performed at [facility], [place] [address] [place], [place] [address]  MRSA PCR Screening     Status: None   Collection Time: 08/01/18 11:57 PM  Result Value Ref Range Status   MRSA by PCR NEGATIVE NEGATIVE Final    Comment:        The GeneXpert MRSA Assay (FDA approved for NASAL specimens only), is one component of a comprehensive MRSA colonization surveillance program. It is not intended to diagnose MRSA infection nor to guide or monitor treatment for MRSA infections. Performed at [facility], [place] [address] [place], [place] [address]     Radiology Reports Dg Chest Port 1 View  Result Date: 08/06/2018 CLINICAL DATA:  Evaluate pneumothorax.  COVID-19 pneumonia. EXAM: PORTABLE CHEST 1 VIEW  COMPARISON:  08/04/2018 FINDINGS: Findings right internal jugular line tip at low  SVC. Removal of left chest tube, without pneumothorax. Mild cardiomegaly. Atherosclerosis in the transverse aorta. No pleural fluid. Left greater than right and basilar predominant interstitial opacities are similar, given differences in technique. IMPRESSION: No significant change in left greater than right interstitial opacities, consistent with pneumonia. Removal of left chest tube, without pneumothorax or pleural fluid. Aortic Atherosclerosis (ICD10-I70.0). Electronically Signed   By: Abigail Miyamoto M.D.   On: 08/06/2018 08:51   Dg Chest Port 1 View  Result Date: 08/04/2018 CLINICAL DATA:  Respiratory failure EXAM: PORTABLE CHEST 1 VIEW COMPARISON:  08/04/2018 FINDINGS: Left chest tube is stable. Left jugular central venous catheter is stable with its tip at the cavoatrial junction. Extensive bilateral airspace disease is not significantly changed. No pneumothorax. IMPRESSION: Stable extensive bilateral airspace disease. Stable left chest tube without pneumothorax. Electronically Signed   By: Marybelle Killings M.D.   On: 08/04/2018 11:07   Dg Chest Port 1 View  Result Date: 08/04/2018 CLINICAL DATA:  Respiratory failure EXAM: PORTABLE CHEST 1 VIEW COMPARISON:  08/03/2018 FINDINGS: Pigtail chest tube on the left unchanged. No pneumothorax. Left jugular central venous catheter tip SVC/RA junction unchanged. Diffuse bilateral airspace disease left greater than right appears unchanged. Small left effusion unchanged. Underlying COPD. IMPRESSION: Left chest tube in place no pneumothorax Diffuse bilateral airspace disease left greater than right unchanged. Electronically Signed   By: Franchot Gallo M.D.   On: 08/04/2018 07:56   Dg Chest Port 1 View  Result Date: 08/03/2018 CLINICAL DATA:  Status post extubation today. EXAM: PORTABLE CHEST 1 VIEW COMPARISON:  Single-view of the chest 08/01/2018. FINDINGS: ETT and NG tube been removed.  Left IJ catheter is unchanged. Left chest tube has backed out slightly. Lung volumes are low. Coarsening of the pulmonary interstitium is worse on the left unchanged no pneumothorax or pleural effusion. Heart size is normal. Aortic atherosclerosis is noted. IMPRESSION: Status post extubation and NG tube removal. The patient's left chest tube has backed out slightly. No pneumothorax. Coarsening of the interstitial is much worse on the left and unchanged. Electronically Signed   By: Inge Rise M.D.   On: 08/03/2018 12:53   Dg Chest Port 1 View  Result Date: 08/01/2018 CLINICAL DATA:  Pneumothorax. Chest tube placement. EXAM: PORTABLE CHEST 1 VIEW COMPARISON:  Earlier this day at 1749 hour, radiograph 07/30/2018 FINDINGS: Placement of left pigtail catheter with tip near the apex. Left pneumothorax is significantly decreased in size and near completely resolved. Possible tiny lucency remains at the left lung apex and laterally. Endotracheal tube, enteric tube, and left central line remain in place. Diffuse bilateral interstitial coarsening and patchy opacities throughout the left lung. IMPRESSION: Placement of left pigtail catheter with significantly decreased size of left pneumothorax. Possible tiny residual remains at the left lung apex and laterally. Diffuse bilateral interstitial opacities patchy opacities throughout the left lung, with some improvement from 07/30/2018 radiograph. Electronically Signed   By: Keith Rake M.D.   On: 08/01/2018 20:36   Dg Chest Port 1 View  Result Date: 08/01/2018 CLINICAL DATA:  Endotracheal tube placement. Central line placement. EXAM: PORTABLE CHEST 1 VIEW COMPARISON:  07/30/2018 FINDINGS: 1749 hours. Endotracheal tube tip is 3.7 cm above the base of the carina. The NG tube passes into the stomach although the distal tip position is not included on the film. Left IJ central line tip overlies the mid to distal SVC level. Underlying diffuse interstitial opacity again  noted with apparent improvement in airspace disease and better lung aeration. Patient  is noted to have a left pneumothorax, of moderate to large size. Telemetry leads overlie the chest. IMPRESSION: Moderate to large left pneumothorax. Diffuse underlying interstitial opacity in both lungs with some improvement in the airspace disease seen previously. Endotracheal tube, left IJ central line, and NG tube are new in the interval. I called the results of this study to the charge nurse, Zoe, and the clinical team was already aware of the pneumothorax she reported that a left chest tube has already been placed. Electronically Signed   By: Misty Stanley M.D.   On: 08/01/2018 19:10   Dg Chest Port 1 View  Result Date: 07/30/2018 CLINICAL DATA:  Sob and cough x 1 week. No fever at this time. EXAM: PORTABLE CHEST 1 VIEW COMPARISON:  01/09/2014 FINDINGS: There are bilateral interstitial and airspace opacities, with airspace opacity most prominent in the peripheral left lung. There are additional chronic areas of scarring in both upper lobes stable from the prior exam. No convincing pleural effusion.  No pneumothorax. Cardiac silhouette is normal in size. No mediastinal or hilar masses. Skeletal structures are grossly intact. IMPRESSION: 1. Bilateral, left greater than right, interstitial and airspace lung opacities consistent with multifocal pneumonia. Electronically Signed   By: Lajean Manes M.D.   On: 07/30/2018 12:55      Phillips Climes M.D on 08/07/2018 at 11:39 AM  Between 7am to 7pm - Pager - [phone]  After 7pm go to [email or web address] - password [email or web address]  [facility] -  Office  [phone]

## 2018-08-08 LAB — COMPREHENSIVE METABOLIC PANEL
ALT: 32 U/L (ref 0–44)
AST: 19 U/L (ref 15–41)
Albumin: 2.6 g/dL — ABNORMAL LOW (ref 3.5–5.0)
Alkaline Phosphatase: 51 U/L (ref 38–126)
Anion gap: 8 (ref 5–15)
BUN: 18 mg/dL (ref 8–23)
CO2: 29 mmol/L (ref 22–32)
Calcium: 8.5 mg/dL — ABNORMAL LOW (ref 8.9–10.3)
Chloride: 98 mmol/L (ref 98–111)
Creatinine, Ser: 0.87 mg/dL (ref 0.61–1.24)
GFR calc Af Amer: >60 mL/min (ref >60)
GFR calc non Af Amer: >60 mL/min (ref >60)
Glucose, Bld: 94 mg/dL (ref 70–99)
Potassium: 3.2 mmol/L — ABNORMAL LOW (ref 3.5–5.1)
Sodium: 135 mmol/L (ref 135–145)
Total Bilirubin: 0.4 mg/dL (ref 0.3–1.2)
Total Protein: 6 g/dL — ABNORMAL LOW (ref 6.5–8.1)

## 2018-08-08 LAB — D-DIMER, QUANTITATIVE: D-Dimer, Quant: 2.93 ug/mL-FEU — ABNORMAL HIGH (ref 0.00–0.50)

## 2018-08-08 LAB — CBC
HCT: 33.6 % — ABNORMAL LOW (ref 39.0–52.0)
Hemoglobin: 10.4 g/dL — ABNORMAL LOW (ref 13.0–17.0)
MCH: 28.6 pg (ref 26.0–34.0)
MCHC: 31 g/dL (ref 30.0–36.0)
MCV: 92.3 fL (ref 80.0–100.0)
Platelets: 348 10*3/uL (ref 150–400)
RBC: 3.64 MIL/uL — ABNORMAL LOW (ref 4.22–5.81)
RDW: 16.2 % — ABNORMAL HIGH (ref 11.5–15.5)
WBC: 13.8 10*3/uL — ABNORMAL HIGH (ref 4.0–10.5)
nRBC: 0 % (ref 0.0–0.2)

## 2018-08-08 LAB — SARS CORONAVIRUS 2 BY RT PCR (HOSPITAL ORDER, PERFORMED IN ~~LOC~~ HOSPITAL LAB): SARS Coronavirus 2: NEGATIVE

## 2018-08-08 LAB — C-REACTIVE PROTEIN: CRP: 6.4 mg/dL — ABNORMAL HIGH (ref <1.0)

## 2018-08-08 LAB — FERRITIN: Ferritin: 235 ng/mL (ref 24–336)

## 2018-08-08 LAB — PROCALCITONIN: Procalcitonin: <0.1 ng/mL

## 2018-08-08 LAB — FIBRINOGEN: Fibrinogen: 551 mg/dL — ABNORMAL HIGH (ref 210–475)

## 2018-08-08 LAB — MAGNESIUM: Magnesium: 1.9 mg/dL (ref 1.7–2.4)

## 2018-08-08 MED ORDER — POTASSIUM CHLORIDE CRYS ER 20 MEQ PO TBCR
40.0000 meq | EXTENDED_RELEASE_TABLET | Freq: Once | ORAL | Status: AC
  Administered 2018-08-08: 13:00:00 40 meq via ORAL
  Filled 2018-08-08: qty 2

## 2018-08-08 MED ORDER — CHLORHEXIDINE GLUCONATE CLOTH 2 % EX PADS
6.0000 | MEDICATED_PAD | Freq: Every day | CUTANEOUS | Status: DC
  Administered 2018-08-09 – 2018-08-15 (×7): 6 via TOPICAL

## 2018-08-08 NOTE — Progress Notes (Signed)
PROGRESS NOTE    Joel Valdez  JSE:[id number] DOB: 02/08/1946 DOA: 07/30/2018 PCP: Joel Jeans, PA-C    Brief Narrative:  Patient 73 year old gentleman with multiple comorbidities and immunosuppression that includes prostate cancer, rheumatoid arthritis, on chronic prednisone therapy and methotrexate therapy, GERD, hypertension and depression.  Patient was admitted with respiratory failure with bilateral infiltrate.  He was initially intubated and then extubated on 08/03/2018.  He also developed a small pneumothorax that was treated with chest tube which is discontinued now.  Patient had negative COVID-19.  Still remains on high flow oxygen.   Assessment & Plan:   Principal Problem:   Pneumonia due to COVID-19 virus Active Problems:   Glaucoma   Essential hypertension, benign   Prostate cancer ([place])   Depression   Rheumatoid arthritis involving both hands ([place])   Essential hypertension  Acute hypoxic respiratory failure: Secondary to multifocal pneumonia.  Intubation for 12/13/2018, extubated 08/03/2018.  Still remains on high flow oxygen.  Currently on 15 L. Continue aggressive bronchodilator therapy, chest physiotherapy and PT OT. He has been started on IV diuresis. Will repeat chest x-ray tomorrow. COVID-19 was negative, has high suspicion.  He was treated with Plaquenil.  Continue with zinc and vitamin C. Because of high suspicion, will keep on isolation precautions.  Discussed with infectious disease and repeating test today. I am going to discontinue daily labs as this is not going to change any management.  Will repeat chest x-ray tomorrow and decide about IV antibiotics.  Left lung pneumothorax: Chest tube was removed on 08/04/2018.  Repeat x-ray was with improvement.  Due to ongoing requirement of high flow oxygen, will recheck his chest x-ray tomorrow morning.  Rheumatoid arthritis: On chronic prednisone therapy that has been continued.  Will hold methotrexate in the  setting of acute infection.  Hypertension: Fairly stable.  Hypokalemia: We will replace further.   DVT prophylaxis: Lovenox subcu Code Status: Partial Family Communication: Patient's wife was updated over the phone Disposition Plan: Unknown at this time.  Patient will benefit with inpatient physical therapy once his oxygen requirement improves.   Consultants:   PCCM  Procedures:   Intubation 4/8-4/10  Left IJ central line 4/8  Chest tube left, 4/8-4/11  Antimicrobials:   Azithromycin 500 mg IV, 07/31/2018/-  Ceftriaxone 1 g every 24 hours, 07/31/2018-   Subjective: Patient was seen and examined.  No overnight events.  Remains on 15 L of oxygen.  Afebrile.  Patient himself is stated that he just does not feel well.  He feels weak. He did have hard time working with PT OT because of high oxygen requirement.  Objective: Vitals:   08/08/18 0438 08/08/18 0700 08/08/18 0800 08/08/18 0900  BP:  138/70 133/67 (!) 143/71  Pulse:  75 70 91  Resp:  16 20 (!) 23  Temp:    97.8 F (36.6 C)  TempSrc:    Axillary  SpO2:  97% 99% 98%  Weight: 58.9 kg     Height:        Intake/Output Summary (Last 24 hours) at 08/08/2018 1037 Last data filed at 08/08/2018 0916 Gross per 24 hour  Intake 400 ml  Output 1850 ml  Net -1450 ml   Filed Weights   08/06/18 0234 08/07/18 0402 08/08/18 0438  Weight: 68.6 kg 67.1 kg 58.9 kg    Examination:  General exam: Appears calm and comfortable , he is not in any obvious distress though he is on very high flow oxygen. Respiratory system: Clear to  auscultation. Respiratory effort normal.  He has poor air entry bilateral, however no additional sounds. Cardiovascular system: S1 & S2 heard, RRR. No JVD, murmurs, rubs, gallops or clicks. No pedal edema. Gastrointestinal system: Abdomen is nondistended, soft and nontender. No organomegaly or masses felt. Normal bowel sounds heard. Central nervous system: Alert and oriented. No focal neurological  deficits. Extremities: Symmetric 5 x 5 power. Skin: No rashes, lesions or ulcers Psychiatry: Judgement and insight appear normal. Mood & affect appropriate.  Anxious.    Data Reviewed: I have personally reviewed following labs and imaging studies  CBC: Recent Labs  Lab 08/02/18 0350 08/03/18 0500 08/04/18 0305 08/05/18 0502 08/07/18 0409 08/08/18 0421  WBC 18.9* 12.0* 12.7* 9.6 12.1* 13.8*  NEUTROABS 14.3* 9.0* 9.2* 6.7  --   --   HGB 11.3* 9.8* 9.8* 9.7* 11.2* 10.4*  HCT 35.8* 31.6* 31.7* 30.8* 35.2* 33.6*  MCV 93.2 95.2 94.1 93.1 93.1 92.3  PLT 421* 302 313 238 361 163   Basic Metabolic Panel: Recent Labs  Lab 08/02/18 0350 08/03/18 0500 08/04/18 0305 08/05/18 0502 08/07/18 0409 08/08/18 0421  NA 135 137 135 138 139 135  K 3.4* 5.8* 3.8 3.8 4.1 3.2*  CL 103 108 104 104 101 98  CO2 21* _0 GLUCOSE 103* 93 90 90 81 94  BUN 26* 31* 25* _1 CREATININE 1.48* 1.19 1.05 0.97 0.99 0.87  CALCIUM 8.8* 8.4* 8.8* 8.6* 9.2 8.5*  MG 2.1 2.3 2.1 2.1  --  1.9  PHOS  --  3.1 2.8 2.9  --   --    GFR: Estimated Creatinine Clearance: 63 mL/min (by C-G formula based on SCr of 0.87 mg/dL). Liver Function Tests: Recent Labs  Lab 08/02/18 0350 08/03/18 0500 08/04/18 0305 08/07/18 0409 08/08/18 0421  AST 13* 21 14* 24 19  ALT _2 32 32  ALKPHOS 51 40 43 55 51  BILITOT 0.6 1.3* 0.4 0.6 0.4  PROT 6.3* 5.7* 6.1* 6.5 6.0*  ALBUMIN 3.0* 2.5* 2.8* 2.8* 2.6*   No results for input(s): LIPASE, AMYLASE in the last 168 hours. No results for input(s): AMMONIA in the last 168 hours. Coagulation Profile: No results for input(s): INR, PROTIME in the last 168 hours. Cardiac Enzymes: Recent Labs  Lab 08/02/18 0350 08/03/18 0500 08/04/18 0305  CKTOTAL 33* 94 56   BNP (last 3 results) No results for input(s): PROBNP in the last 8760 hours. HbA1C: No results for input(s): HGBA1C in the last 72 hours. CBG: Recent Labs  Lab 08/02/18 2124 08/03/18 0032  08/03/18 0520 08/03/18 0747 08/03/18 1107  GLUCAP 131* 144* 98 106* 124*   Lipid Profile: No results for input(s): CHOL, HDL, LDLCALC, TRIG, CHOLHDL, LDLDIRECT in the last 72 hours. Thyroid Function Tests: No results for input(s): TSH, T4TOTAL, FREET4, T3FREE, THYROIDAB in the last 72 hours. Anemia Panel: Recent Labs    08/07/18 0409 08/08/18 0422  FERRITIN 276 235   Sepsis Labs: Recent Labs  Lab 08/07/18 0409 08/08/18 0421  PROCALCITON <0.10 <0.10    Recent Results (from the past 240 hour(s))  Culture, blood (routine x 2)     Status: None   Collection Time: 07/30/18 12:21 PM  Result Value Ref Range Status   Specimen Description BLOOD RIGHT ANTECUBITAL DRAWN BY RN  Final   Special Requests   Final    BOTTLES DRAWN AEROBIC AND ANAEROBIC Blood Culture adequate volume   Culture   Final    NO GROWTH  5 DAYS Performed at [facility], [address], [place], [place] [address]    Report Status 08/04/2018 FINAL  Final  Urine culture     Status: None   Collection Time: 07/30/18 12:24 PM  Result Value Ref Range Status   Specimen Description   Final    URINE, RANDOM Performed at [facility], [address], [place], [place] [address]    Special Requests   Final    NONE Performed at [facility], [address], [place], [place] [address]    Culture   Final    NO GROWTH Performed at [facility], [place] [address] [place], [place] [address]    Report Status 07/31/2018 FINAL  Final  Culture, blood (routine x 2)     Status: None   Collection Time: 07/30/18  1:00 PM  Result Value Ref Range Status   Specimen Description BLOOD LEFT ANTECUBITAL  Final   Special Requests   Final    BOTTLES DRAWN AEROBIC AND ANAEROBIC Blood Culture adequate volume   Culture   Final    NO GROWTH 5 DAYS Performed at [facility], [address] [place], [place] [address]    Report Status 08/04/2018 FINAL  Final  Novel Coronavirus, NAA (hospital order; send-out to ref lab)     Status: None    Collection Time: 07/30/18  2:16 PM  Result Value Ref Range Status   SARS-CoV-2, NAA NOT DETECTED NOT DETECTED Final    Comment: Negative (Not Detected) results do not exclude infection caused by SARS CoV 2 and should not be used as the sole basis for treatment or other patient management decisions. Optimum specimen types and timing for peak viral levels during infections caused  by SARS CoV 2 have not been determined. Collection of multiple specimens (types and time points) from the same patient may be necessary to detect the virus. Improper specimen collection and handling, sequence variability underlying assay primers and or probes, or the presence of organisms in  quantities less than the limit of detection of the assay may lead to false negative results. Positive and negative predictive values of testing are highly dependent on prevalence. False negative results are more likely when prevalence of disease is high. (NOTE) The expected result is Negative (Not Detected). The SARS CoV 2 test is intended for the presumptive qualitative  detection of nucleic acid from SARS CoV 2 in upper and lower  respir atory specimens. Testing methodology is real time RT PCR. Test results must be correlated with clinical presentation and  evaluated in the context of other laboratory and epidemiologic data.  Test performance can be affected because the epidemiology and  clinical spectrum of infection caused by SARS CoV 2 is not fully  known. For example, the optimum types of specimens to collect and  when during the course of infection these specimens are most likely  to contain detectable viral RNA may not be known. This test has not been Food and Drug Administration (FDA) cleared or  approved and has been authorized by FDA under an Emergency Use  Authorization (EUA). The test is only authorized for the duration of  the declaration that circumstances exist justifying the authorization  of emergency use of  in vitro diagnostic tests for detection and or  diagnosis of SARS CoV 2 under Section 564(b)(1) of the Act, 21 U.S.C.  section [phone] 3(b)(1), unless the authorization is terminated or   revoked sooner. [facility] is certified under the  Clinical  Laboratory Improvement Amendments of 1988 (CLIA), 42 U.S.C.  section [phone], to perform high complexity tests. Performed at [place] [id number], [place], [place] [address] Laboratory Director: Loleta Books, MD Fact Sheet for Healthcare Providers  BankingDealers.co.za Fact Sheet for Patients  StrictlyIdeas.no Performed at [facility], [place] [address], [place], [place] [address]    Coronavirus Source NASOPHARYNGEAL  Final    Comment: Performed at [facility], [address], [place], [place] [address]  Respiratory Panel by PCR     Status: None   Collection Time: 07/30/18  2:16 PM  Result Value Ref Range Status   Adenovirus NOT DETECTED NOT DETECTED Final   Coronavirus 229E NOT DETECTED NOT DETECTED Final    Comment: (NOTE) The Coronavirus on the Respiratory Panel, DOES NOT test for the novel  Coronavirus (2019 nCoV)    Coronavirus HKU1 NOT DETECTED NOT DETECTED Final   Coronavirus NL63 NOT DETECTED NOT DETECTED Final   Coronavirus OC43 NOT DETECTED NOT DETECTED Final   Metapneumovirus NOT DETECTED NOT DETECTED Final   Rhinovirus / Enterovirus NOT DETECTED NOT DETECTED Final   Influenza A NOT DETECTED NOT DETECTED Final   Influenza B NOT DETECTED NOT DETECTED Final   Parainfluenza Virus 1 NOT DETECTED NOT DETECTED Final   Parainfluenza Virus 2 NOT DETECTED NOT DETECTED Final   Parainfluenza Virus 3 NOT DETECTED NOT DETECTED Final   Parainfluenza Virus 4 NOT DETECTED NOT DETECTED Final   Respiratory Syncytial Virus NOT DETECTED NOT DETECTED Final   Bordetella pertussis NOT DETECTED NOT DETECTED Final    Chlamydophila pneumoniae NOT DETECTED NOT DETECTED Final   Mycoplasma pneumoniae NOT DETECTED NOT DETECTED Final    Comment: Performed at [facility], [place] [address] [place], [place] [address]  MRSA PCR Screening     Status: None   Collection Time: 08/01/18 11:57 PM  Result Value Ref Range Status   MRSA by PCR NEGATIVE NEGATIVE Final    Comment:        The GeneXpert MRSA Assay (FDA approved for NASAL specimens only), is one component of a comprehensive MRSA colonization surveillance program. It is not intended to diagnose MRSA infection nor to guide or monitor treatment for MRSA infections. Performed at [facility], [place] [address], [place],  [address]          Radiology Studies: No results found.      Scheduled Meds: . atorvastatin  10 mg Per Tube QHS  . chlorhexidine gluconate (MEDLINE KIT)  15 mL Mouth Rinse BID  . Chlorhexidine Gluconate Cloth  6 each Topical Daily  . enoxaparin (LOVENOX) injection  40 mg Subcutaneous Q24H  . escitalopram  10 mg Oral Daily  . feeding supplement (ENSURE ENLIVE)  237 mL Oral Q24H  . folic acid  1 mg Oral Daily  . furosemide  60 mg Intravenous Daily  . guaiFENesin  1,200 mg Oral BID  . Ipratropium-Albuterol  2 puff Inhalation Q6H  . latanoprost  1 drop Both Eyes QHS  . mouth rinse  15 mL Mouth Rinse BID  . mometasone-formoterol  2 puff Inhalation BID  . multivitamin with minerals  1 tablet Oral Daily  . pantoprazole  40 mg Oral Daily  . potassium chloride  40 mEq Oral Once  . predniSONE  10 mg Oral Daily  . sodium chloride flush  10-40 mL Intracatheter Q12H  . timolol  1 drop Both Eyes Daily  .  vitamin C  500 mg Oral BID  . zinc sulfate  220 mg Oral Daily   Continuous Infusions: . sodium chloride Stopped (08/05/18 0821)  . azithromycin Stopped (08/07/18 1544)  . cefTRIAXone (ROCEPHIN)  IV 1 g (08/08/18 0928)     LOS: 9 days    Time spent: 25 minutes    Barb Merino, MD [facility] Pager [phone]  If 7PM-7AM, please contact night-coverage [email or web address] Password [facility] 08/08/2018, 10:37 AM

## 2018-08-09 ENCOUNTER — Ambulatory Visit: Payer: Medicare HMO | Admitting: Neurology

## 2018-08-09 ENCOUNTER — Encounter

## 2018-08-09 ENCOUNTER — Inpatient Hospital Stay (HOSPITAL_COMMUNITY): Payer: Medicare Other

## 2018-08-09 LAB — CBC
HCT: 34 % — ABNORMAL LOW (ref 39.0–52.0)
Hemoglobin: 10.8 g/dL — ABNORMAL LOW (ref 13.0–17.0)
MCH: 29.3 pg (ref 26.0–34.0)
MCHC: 31.8 g/dL (ref 30.0–36.0)
MCV: 92.4 fL (ref 80.0–100.0)
Platelets: 351 10*3/uL (ref 150–400)
RBC: 3.68 MIL/uL — ABNORMAL LOW (ref 4.22–5.81)
RDW: 16.1 % — ABNORMAL HIGH (ref 11.5–15.5)
WBC: 16 10*3/uL — ABNORMAL HIGH (ref 4.0–10.5)
nRBC: 0 % (ref 0.0–0.2)

## 2018-08-09 LAB — COMPREHENSIVE METABOLIC PANEL
ALT: 33 U/L (ref 0–44)
AST: 19 U/L (ref 15–41)
Albumin: 2.7 g/dL — ABNORMAL LOW (ref 3.5–5.0)
Alkaline Phosphatase: 56 U/L (ref 38–126)
Anion gap: 8 (ref 5–15)
BUN: 24 mg/dL — ABNORMAL HIGH (ref 8–23)
CO2: 28 mmol/L (ref 22–32)
Calcium: 8.7 mg/dL — ABNORMAL LOW (ref 8.9–10.3)
Chloride: 100 mmol/L (ref 98–111)
Creatinine, Ser: 1.05 mg/dL (ref 0.61–1.24)
GFR calc Af Amer: >60 mL/min (ref >60)
GFR calc non Af Amer: >60 mL/min (ref >60)
Glucose, Bld: 95 mg/dL (ref 70–99)
Potassium: 3.7 mmol/L (ref 3.5–5.1)
Sodium: 136 mmol/L (ref 135–145)
Total Bilirubin: 0.5 mg/dL (ref 0.3–1.2)
Total Protein: 6.3 g/dL — ABNORMAL LOW (ref 6.5–8.1)

## 2018-08-09 LAB — HIV ANTIBODY (ROUTINE TESTING W REFLEX): HIV Screen 4th Generation wRfx: NONREACTIVE

## 2018-08-09 MED ORDER — LOPERAMIDE HCL 2 MG PO CAPS
2.0000 mg | ORAL_CAPSULE | ORAL | Status: DC | PRN
  Administered 2018-08-09: 2 mg via ORAL
  Filled 2018-08-09: qty 1

## 2018-08-09 MED ORDER — TRAZODONE HCL 50 MG PO TABS
50.0000 mg | ORAL_TABLET | Freq: Once | ORAL | Status: AC
  Administered 2018-08-09: 23:00:00 50 mg via ORAL
  Filled 2018-08-09: qty 1

## 2018-08-09 MED ORDER — METHYLPREDNISOLONE SODIUM SUCC 125 MG IJ SOLR
60.0000 mg | Freq: Four times a day (QID) | INTRAMUSCULAR | Status: DC
  Administered 2018-08-09 – 2018-08-12 (×12): 60 mg via INTRAVENOUS
  Filled 2018-08-09 (×11): qty 2

## 2018-08-09 MED ORDER — SODIUM CHLORIDE (PF) 0.9 % IJ SOLN
INTRAMUSCULAR | Status: AC
  Filled 2018-08-09: qty 50

## 2018-08-09 MED ORDER — IOHEXOL 350 MG/ML SOLN
100.0000 mL | Freq: Once | INTRAVENOUS | Status: AC | PRN
  Administered 2018-08-09: 10:00:00 100 mL via INTRAVENOUS

## 2018-08-09 NOTE — Progress Notes (Signed)
Physical Therapy Treatment Patient Details Name: Joel Valdez. [place] MRN: [id number] DOB: [birth year] Today's Date: 08/09/2018    History of Present Illness Pt admitted 2* SOB with acute respiratory failure and intubated 07/31/08 and extubated 08/02/08.  Pt  with hx of RA, asthma, anxiety/depression, and deafness R ear    PT Comments    Joel Valdez reports feeling better. Patient  Remained on 15 L HFNC and tolerated activity with drop in saturation to 82% briefly after all activity, returned to 92% much quicker than previous visits. Continue PT.  Follow Up Recommendations  SNF(vs home)     Equipment Recommendations  None recommended by PT    Recommendations for Other Services       Precautions / Restrictions Precautions Precaution Comments: sats drop, ck w/ RN about amount of O2 to use- wants patient 90-92% and , not too high per Alroy Dust RN    Mobility  Bed Mobility   Bed Mobility: Supine to Sit     Supine to sit: Modified independent (Device/Increase time)     General bed mobility comments: no assistance except lines  Transfers Overall transfer level: Needs assistance Equipment used: Rolling walker (2 wheeled) Transfers: Sit to/from [facility] Sit to Stand: Min assist Stand pivot transfers: Min assist       General transfer comment: steady assist to stand and HHA to get to recliner  Ambulation/Gait Ambulation/Gait assistance: Min assist Gait Distance (Feet): 8 Feet(forward then backward) Assistive device: Rolling walker (2 wheeled) Gait Pattern/deviations: Step-through pattern     General Gait Details: ambulated forward and back x 8' as far as lines could go. Patient on 15 L HFNC with sats with O2 sats staying  at 88-90% with a gradual drop to 82 % after patient sat down. HR 90's. RR low 30's patient tolerated much better, returned to 94% saturation within 1 minute.   Stairs             Wheelchair Mobility    Modified Rankin (Stroke  Patients Only)       Balance                                            Cognition Arousal/Alertness: Awake/alert                                     General Comments: patient is much more animated, conversive , asking about why he has been so sick(defer to MD to answer)      Exercises General Exercises - Lower Extremity Ankle Circles/Pumps: AROM;Both;15 reps;Supine Long Arc Quad: AROM;Both;10 reps;Seated Hip ABduction/ADduction: AROM;Both;10 reps;Seated Hip Flexion/Marching: AROM;Seated;Both;10 reps    General Comments        Pertinent Vitals/Pain Pain Assessment: Faces Faces Pain Scale: Hurts a little bit Pain Location: shoulders" but they are better" Pain Intervention(s): Monitored during session    Home Living                      Prior Function            PT Goals (current goals can now be found in the care plan section) Progress towards PT goals: Progressing toward goals    Frequency    Min 3X/week      PT Plan Current plan remains appropriate  Co-evaluation              AM-PAC PT "6 Clicks" Mobility   Outcome Measure  Help needed turning from your back to your side while in a flat bed without using bedrails?: None Help needed moving from lying on your back to sitting on the side of a flat bed without using bedrails?: None Help needed moving to and from a bed to a chair (including a wheelchair)?: A Little Help needed standing up from a chair using your arms (e.g., wheelchair or bedside chair)?: A Little Help needed to walk in hospital room?: A Lot Help needed climbing 3-5 steps with a railing? : Total 6 Click Score: 17    End of Session Equipment Utilized During Treatment: Oxygen Activity Tolerance: Patient tolerated treatment well Patient left: in chair;with call bell/phone within reach;with chair alarm set Nurse Communication: Mobility status PT Visit Diagnosis: Unsteadiness on feet  (R26.81)     Time: 1330-1405 PT Time Calculation (min) (ACUTE ONLY): 35 min  Charges:  $Gait Training: 8-22 mins $Therapeutic Exercise: 8-22 mins                     [place] Pager [phone] Office [phone]    Claretha Cooper 08/09/2018, 2:32 PM

## 2018-08-09 NOTE — Progress Notes (Signed)
PROGRESS NOTE    [name]  JAS:[id number] DOB: 08/15/1945 DOA: 07/30/2018 PCP: [name], PA-C    Brief Narrative:  Patient 73 year old gentleman with multiple comorbidities and immunosuppression that includes prostate cancer, rheumatoid arthritis, on chronic prednisone therapy and methotrexate therapy, GERD, hypertension and depression.  Patient was admitted with respiratory failure with bilateral infiltrate.  He was initially intubated and then extubated on 08/03/2018.  He also developed a small pneumothorax that was treated with chest tube which is discontinued now.  Patient had negative COVID-19.  Still remains on high flow oxygen. COVID 19 x 2 negative.  Family member that live in same household is also sick on ventillator, negative COVID19   Assessment & Plan:   Principal Problem:   Pneumonia due to COVID-19 virus Active Problems:   Glaucoma   Essential hypertension, benign   Prostate cancer ([place])   Depression   Rheumatoid arthritis involving both hands ([place])   Essential hypertension  Acute hypoxic respiratory failure: Secondary to multifocal pneumonia.  Intubation for 12/13/2018, extubated 08/03/2018.  Still remains on high flow oxygen.  Currently on 15 L. Continue aggressive bronchodilator therapy, chest physiotherapy and PT OT. He has been started on IV diuresis.  COVID-19 was negative, has high suspicion.  He was treated with Plaquenil.  Continue with zinc and vitamin C. Because of high suspicion, will keep on isolation precautions.  Because of persistent high requirement of oxygen, will try to do a CTA of the chest to look for pulmonary embolism as well as parenchymal disease or extent of pneumonia.  Left lung pneumothorax: Chest tube was removed on 08/04/2018.  Repeat x-ray was with improvement.  Due to ongoing requirement of high flow oxygen, CT chest today.  Rheumatoid arthritis: On chronic prednisone therapy that has been continued.  Will hold methotrexate in  the setting of acute infection.  Hypertension: Fairly stable.  Hypokalemia: Improved with replacement.   DVT prophylaxis: Lovenox subcu Code Status: Partial Family Communication: Patient's wife on the phone. Disposition Plan: Unknown at this time.  Patient will benefit with inpatient physical therapy once his oxygen requirement improves.   Consultants:   PCCM  Procedures:   Intubation 4/8-4/10  Left IJ central line 4/8  Chest tube left, 4/8-4/11  Antimicrobials:   Azithromycin 500 mg IV, 07/31/2018/-  Ceftriaxone 1 g every 24 hours, 07/31/2018-   Subjective: Patient was seen and examined.  No overnight events.  Remains on 15 L of oxygen.  Afebrile.  Patient himself feels little bit stronger and better today. I try to decrease his oxygen level to 10 L/min, he desatted to 86%.  Improved on 13 L.  Objective: Vitals:   08/09/18 0400 08/09/18 0500 08/09/18 0600 08/09/18 0800  BP: 130/75 (!) 144/74 (!) 144/82 133/78  Pulse: 75 71 78 77  Resp: (!) 26 (!) 22 (!) 24 (!) 26  Temp:    98.4 F (36.9 C)  TempSrc:      SpO2: 96% 100% 100% 94%  Weight:      Height:        Intake/Output Summary (Last 24 hours) at 08/09/2018 0946 Last data filed at 08/09/2018 0805 Gross per 24 hour  Intake 1080 ml  Output 1725 ml  Net -645 ml   Filed Weights   08/07/18 0402 08/08/18 0438 08/09/18 0336  Weight: 67.1 kg 58.9 kg 67.2 kg    Examination:  General exam: Appears calm and comfortable , he is not in any obvious distress though he is on very high  flow oxygen. Respiratory system: Clear to auscultation. Respiratory effort normal.  He has poor air entry bilateral, however no additional sounds. Cardiovascular system: S1 & S2 heard, RRR. No JVD, murmurs, rubs, gallops or clicks. No pedal edema. Gastrointestinal system: Abdomen is nondistended, soft and nontender. No organomegaly or masses felt. Normal bowel sounds heard. Central nervous system: Alert and oriented. No focal neurological  deficits. Extremities: Symmetric 5 x 5 power. Skin: No rashes, lesions or ulcers Psychiatry: Judgement and insight appear normal. Mood & affect appropriate.     Data Reviewed: I have personally reviewed following labs and imaging studies  CBC: Recent Labs  Lab 08/03/18 0500 08/04/18 0305 08/05/18 0502 08/07/18 0409 08/08/18 0421 08/09/18 0350  WBC 12.0* 12.7* 9.6 12.1* 13.8* 16.0*  NEUTROABS 9.0* 9.2* 6.7  --   --   --   HGB 9.8* 9.8* 9.7* 11.2* 10.4* 10.8*  HCT 31.6* 31.7* 30.8* 35.2* 33.6* 34.0*  MCV 95.2 94.1 93.1 93.1 92.3 92.4  PLT 302 313 238 361 348 320   Basic Metabolic Panel: Recent Labs  Lab 08/03/18 0500 08/04/18 0305 08/05/18 0502 08/07/18 0409 08/08/18 0421 08/09/18 0350  NA 137 135 138 139 135 136  K 5.8* 3.8 3.8 4.1 3.2* 3.7  CL 108 104 104 101 98 100  CO2 _0 GLUCOSE 93 90 90 81 94 95  BUN 31* 25* _1 24*  CREATININE 1.19 1.05 0.97 0.99 0.87 1.05  CALCIUM 8.4* 8.8* 8.6* 9.2 8.5* 8.7*  MG 2.3 2.1 2.1  --  1.9  --   PHOS 3.1 2.8 2.9  --   --   --    GFR: Estimated Creatinine Clearance: 56.5 mL/min (by C-G formula based on SCr of 1.05 mg/dL). Liver Function Tests: Recent Labs  Lab 08/03/18 0500 08/04/18 0305 08/07/18 0409 08/08/18 0421 08/09/18 0350  AST 21 14* _2 ALT 9 15 32 32 33  ALKPHOS 40 43 55 51 56  BILITOT 1.3* 0.4 0.6 0.4 0.5  PROT 5.7* 6.1* 6.5 6.0* 6.3*  ALBUMIN 2.5* 2.8* 2.8* 2.6* 2.7*   No results for input(s): LIPASE, AMYLASE in the last 168 hours. No results for input(s): AMMONIA in the last 168 hours. Coagulation Profile: No results for input(s): INR, PROTIME in the last 168 hours. Cardiac Enzymes: Recent Labs  Lab 08/03/18 0500 08/04/18 0305  CKTOTAL 94 56   BNP (last 3 results) No results for input(s): PROBNP in the last 8760 hours. HbA1C: No results for input(s): HGBA1C in the last 72 hours. CBG: Recent Labs  Lab 08/02/18 2124 08/03/18 0032 08/03/18 0520 08/03/18 0747 08/03/18  1107  GLUCAP 131* 144* 98 106* 124*   Lipid Profile: No results for input(s): CHOL, HDL, LDLCALC, TRIG, CHOLHDL, LDLDIRECT in the last 72 hours. Thyroid Function Tests: No results for input(s): TSH, T4TOTAL, FREET4, T3FREE, THYROIDAB in the last 72 hours. Anemia Panel: Recent Labs    08/07/18 0409 08/08/18 0422  FERRITIN 276 235   Sepsis Labs: Recent Labs  Lab 08/07/18 0409 08/08/18 0421  PROCALCITON <0.10 <0.10    Recent Results (from the past 240 hour(s))  Culture, blood (routine x 2)     Status: None   Collection Time: 07/30/18 12:21 PM  Result Value Ref Range Status   Specimen Description BLOOD RIGHT ANTECUBITAL DRAWN BY RN  Final   Special Requests   Final    BOTTLES DRAWN AEROBIC AND ANAEROBIC Blood Culture adequate volume   Culture  Final    NO GROWTH 5 DAYS Performed at [facility], [address], [place], [place] [address]    Report Status 08/04/2018 FINAL  Final  Urine culture     Status: None   Collection Time: 07/30/18 12:24 PM  Result Value Ref Range Status   Specimen Description   Final    URINE, RANDOM Performed at [facility], [address], [place], [place] [address]    Special Requests   Final    NONE Performed at [facility], [address], [place], [place] [address]    Culture   Final    NO GROWTH Performed at [facility], [place] [address], [place], [place] [address]    Report Status 07/31/2018 FINAL  Final  Culture, blood (routine x 2)     Status: None   Collection Time: 07/30/18  1:00 PM  Result Value Ref Range Status   Specimen Description BLOOD LEFT ANTECUBITAL  Final   Special Requests   Final    BOTTLES DRAWN AEROBIC AND ANAEROBIC Blood Culture adequate volume   Culture   Final    NO GROWTH 5 DAYS Performed at [facility], [address] [place], [place] [address]    Report Status 08/04/2018 FINAL  Final  Novel Coronavirus, NAA (hospital order; send-out to ref lab)     Status: None   Collection Time: 07/30/18  2:16 PM   Result Value Ref Range Status   SARS-CoV-2, NAA NOT DETECTED NOT DETECTED Final    Comment: Negative (Not Detected) results do not exclude infection caused by SARS CoV 2 and should not be used as the sole basis for treatment or other patient management decisions. Optimum specimen types and timing for peak viral levels during infections caused  by SARS CoV 2 have not been determined. Collection of multiple specimens (types and time points) from the same patient may be necessary to detect the virus. Improper specimen collection and handling, sequence variability underlying assay primers and or probes, or the presence of organisms in  quantities less than the limit of detection of the assay may lead to false negative results. Positive and negative predictive values of testing are highly dependent on prevalence. False negative results are more likely when prevalence of disease is high. (NOTE) The expected result is Negative (Not Detected). The SARS CoV 2 test is intended for the presumptive qualitative  detection of nucleic acid from SARS CoV 2 in upper and lower  respir atory specimens. Testing methodology is real time RT PCR. Test results must be correlated with clinical presentation and  evaluated in the context of other laboratory and epidemiologic data.  Test performance can be affected because the epidemiology and  clinical spectrum of infection caused by SARS CoV 2 is not fully  known. For example, the optimum types of specimens to collect and  when during the course of infection these specimens are most likely  to contain detectable viral RNA may not be known. This test has not been Food and Drug Administration (FDA) cleared or  approved and has been authorized by FDA under an Emergency Use  Authorization (EUA). The test is only authorized for the duration of  the declaration that circumstances exist justifying the authorization  of emergency use of in vitro diagnostic tests for  detection and or  diagnosis of SARS CoV 2 under Section 564(b)(1) of the Act, 21 U.S.C.  section [phone] 3(b)(1), unless the authorization is terminated or   revoked sooner. [facility]  is certified under the  [place] (CLIA), 42 U.S.C.  section [phone], to perform high complexity tests. Performed at [place] [id number], [place], [place] [address] Laboratory Director: Loleta Books, MD Fact Sheet for Healthcare Providers  BankingDealers.co.za Fact Sheet for Patients  StrictlyIdeas.no Performed at [facility], [place] [address], [place], [place] [address]    Coronavirus Source NASOPHARYNGEAL  Final    Comment: Performed at [facility], [address] [place], [place] [address]  Respiratory Panel by PCR     Status: None   Collection Time: 07/30/18  2:16 PM  Result Value Ref Range Status   Adenovirus NOT DETECTED NOT DETECTED Final   Coronavirus 229E NOT DETECTED NOT DETECTED Final    Comment: (NOTE) The Coronavirus on the Respiratory Panel, DOES NOT test for the novel  Coronavirus (2019 nCoV)    Coronavirus HKU1 NOT DETECTED NOT DETECTED Final   Coronavirus NL63 NOT DETECTED NOT DETECTED Final   Coronavirus OC43 NOT DETECTED NOT DETECTED Final   Metapneumovirus NOT DETECTED NOT DETECTED Final   Rhinovirus / Enterovirus NOT DETECTED NOT DETECTED Final   Influenza A NOT DETECTED NOT DETECTED Final   Influenza B NOT DETECTED NOT DETECTED Final   Parainfluenza Virus 1 NOT DETECTED NOT DETECTED Final   Parainfluenza Virus 2 NOT DETECTED NOT DETECTED Final   Parainfluenza Virus 3 NOT DETECTED NOT DETECTED Final   Parainfluenza Virus 4 NOT DETECTED NOT DETECTED Final   Respiratory Syncytial Virus NOT DETECTED NOT DETECTED Final   Bordetella pertussis NOT DETECTED NOT DETECTED Final   Chlamydophila pneumoniae NOT  DETECTED NOT DETECTED Final   Mycoplasma pneumoniae NOT DETECTED NOT DETECTED Final    Comment: Performed at [facility], [place] [address] [place], [place] [address]  MRSA PCR Screening     Status: None   Collection Time: 08/01/18 11:57 PM  Result Value Ref Range Status   MRSA by PCR NEGATIVE NEGATIVE Final    Comment:        The GeneXpert MRSA Assay (FDA approved for NASAL specimens only), is one component of a comprehensive MRSA colonization surveillance program. It is not intended to diagnose MRSA infection nor to guide or monitor treatment for MRSA infections. Performed at [facility], [place] [address] [place],  [address]   SARS Coronavirus 2 [facility] order, Performed in [facility] hospital lab)     Status: None   Collection Time: 08/08/18  9:44 AM  Result Value Ref Range Status   SARS Coronavirus 2 NEGATIVE NEGATIVE Final    Comment: (NOTE) If result is NEGATIVE SARS-CoV-2 target nucleic acids are NOT DETECTED. The SARS-CoV-2 RNA is generally detectable in upper and lower  respiratory specimens during the acute phase of infection. The lowest  concentration of SARS-CoV-2 viral copies this assay can detect is 250  copies / mL. A negative result does not preclude SARS-CoV-2 infection  and should not be used as the sole basis for treatment or other  patient management decisions.  A negative result may occur with  improper specimen collection / handling, submission of specimen other  than nasopharyngeal swab, presence of viral mutation(s) within the  areas targeted by this assay, and inadequate number of viral copies  (<250 copies / mL). A negative result must be combined with clinical  observations, patient history, and epidemiological information. If result is POSITIVE SARS-CoV-2 target nucleic acids are DETECTED. The  SARS-CoV-2 RNA is generally detectable in upper and lower  respiratory specimens dur ing the acute phase of infection.   Positive  results are indicative of active infection with SARS-CoV-2.  Clinical  correlation with patient history and other diagnostic information is  necessary to determine patient infection status.  Positive results do  not rule out bacterial infection or co-infection with other viruses. If result is PRESUMPTIVE POSTIVE SARS-CoV-2 nucleic acids MAY BE PRESENT.   A presumptive positive result was obtained on the submitted specimen  and confirmed on repeat testing.  While 2019 novel coronavirus  (SARS-CoV-2) nucleic acids may be present in the submitted sample  additional confirmatory testing may be necessary for epidemiological  and / or clinical management purposes  to differentiate between  SARS-CoV-2 and other Sarbecovirus currently known to infect humans.  If clinically indicated additional testing with an alternate test  methodology [phone]) is advised. The SARS-CoV-2 RNA is generally  detectable in upper and lower respiratory sp ecimens during the acute  phase of infection. The expected result is Negative. Fact Sheet for Patients:  StrictlyIdeas.no Fact Sheet for Healthcare Providers: BankingDealers.co.za This test is not yet approved or cleared by the [place] FDA and has been authorized for detection and/or diagnosis of SARS-CoV-2 by FDA under an Emergency Use Authorization (EUA).  This EUA will remain in effect (meaning this test can be used) for the duration of the COVID-19 declaration under Section 564(b)(1) of the Act, 21 U.S.C. section 360bbb-3(b)(1), unless the authorization is terminated or revoked sooner. Performed at [facility], [place] [address], [place], [place] [address]          Radiology Studies: No results found.      Scheduled Meds: . atorvastatin  10 mg Per Tube QHS  . chlorhexidine gluconate (MEDLINE KIT)  15 mL Mouth Rinse BID  . Chlorhexidine Gluconate Cloth  6 each Topical Daily  .  enoxaparin (LOVENOX) injection  40 mg Subcutaneous Q24H  . escitalopram  10 mg Oral Daily  . feeding supplement (ENSURE ENLIVE)  237 mL Oral Q24H  . folic acid  1 mg Oral Daily  . furosemide  60 mg Intravenous Daily  . guaiFENesin  1,200 mg Oral BID  . Ipratropium-Albuterol  2 puff Inhalation Q6H  . latanoprost  1 drop Both Eyes QHS  . mouth rinse  15 mL Mouth Rinse BID  . mometasone-formoterol  2 puff Inhalation BID  . multivitamin with minerals  1 tablet Oral Daily  . pantoprazole  40 mg Oral Daily  . predniSONE  10 mg Oral Daily  . sodium chloride flush  10-40 mL Intracatheter Q12H  . timolol  1 drop Both Eyes Daily  . vitamin C  500 mg Oral BID  . zinc sulfate  220 mg Oral Daily   Continuous Infusions: . sodium chloride 50 mL (08/09/18 0805)  . azithromycin Stopped (08/08/18 1411)  . cefTRIAXone (ROCEPHIN)  IV Stopped (08/09/18 0840)     LOS: 10 days    Time spent: 25 minutes    Barb Merino, MD [facility] Pager [phone]  If 7PM-7AM, please contact night-coverage [email or web address] Password [facility] 08/09/2018, 9:46 AM

## 2018-08-10 LAB — COMPREHENSIVE METABOLIC PANEL
ALT: 35 U/L (ref 0–44)
AST: 19 U/L (ref 15–41)
Albumin: 3.1 g/dL — ABNORMAL LOW (ref 3.5–5.0)
Alkaline Phosphatase: 54 U/L (ref 38–126)
Anion gap: 11 (ref 5–15)
BUN: 26 mg/dL — ABNORMAL HIGH (ref 8–23)
CO2: 29 mmol/L (ref 22–32)
Calcium: 9 mg/dL (ref 8.9–10.3)
Chloride: 95 mmol/L — ABNORMAL LOW (ref 98–111)
Creatinine, Ser: 1.09 mg/dL (ref 0.61–1.24)
GFR calc Af Amer: >60 mL/min (ref >60)
GFR calc non Af Amer: >60 mL/min (ref >60)
Glucose, Bld: 150 mg/dL — ABNORMAL HIGH (ref 70–99)
Potassium: 3.7 mmol/L (ref 3.5–5.1)
Sodium: 135 mmol/L (ref 135–145)
Total Bilirubin: 0.5 mg/dL (ref 0.3–1.2)
Total Protein: 6.9 g/dL (ref 6.5–8.1)

## 2018-08-10 LAB — CBC WITH DIFFERENTIAL/PLATELET
Abs Immature Granulocytes: 0.14 10*3/uL — ABNORMAL HIGH (ref 0.00–0.07)
Basophils Absolute: 0 10*3/uL (ref 0.0–0.1)
Basophils Relative: 0 %
Eosinophils Absolute: 0 10*3/uL (ref 0.0–0.5)
Eosinophils Relative: 0 %
HCT: 34.4 % — ABNORMAL LOW (ref 39.0–52.0)
Hemoglobin: 11 g/dL — ABNORMAL LOW (ref 13.0–17.0)
Immature Granulocytes: 1 %
Lymphocytes Relative: 11 %
Lymphs Abs: 1.1 10*3/uL (ref 0.7–4.0)
MCH: 29.3 pg (ref 26.0–34.0)
MCHC: 32 g/dL (ref 30.0–36.0)
MCV: 91.5 fL (ref 80.0–100.0)
Monocytes Absolute: 0.1 10*3/uL (ref 0.1–1.0)
Monocytes Relative: 1 %
Neutro Abs: 9.4 10*3/uL — ABNORMAL HIGH (ref 1.7–7.7)
Neutrophils Relative %: 87 %
Platelets: 395 10*3/uL (ref 150–400)
RBC: 3.76 MIL/uL — ABNORMAL LOW (ref 4.22–5.81)
RDW: 16 % — ABNORMAL HIGH (ref 11.5–15.5)
WBC: 10.7 10*3/uL — ABNORMAL HIGH (ref 4.0–10.5)
nRBC: 0 % (ref 0.0–0.2)

## 2018-08-10 NOTE — Progress Notes (Signed)
PROGRESS NOTE    Joel Valdez  WER:[id number] DOB: 01/03/1946 DOA: 07/30/2018 PCP: [name], PA-C    Brief Narrative:  Patient 73 year old gentleman with multiple comorbidities and immunosuppression that includes prostate cancer, rheumatoid arthritis, on chronic prednisone therapy and methotrexate therapy, GERD, hypertension and depression.  Patient was admitted with respiratory failure with bilateral infiltrate.  He was initially intubated and then extubated on 08/03/2018.  He also developed a small pneumothorax that was treated with chest tube which is discontinued now.  Patient had negative COVID-19.  Still remains on high flow oxygen. COVID 19 x 2 negative.  Family member that live in same household is also sick on ventilator with repeat negative COVID19   Assessment & Plan:   Principal Problem:   Pneumonia due to COVID-19 virus Active Problems:   Glaucoma   Essential hypertension, benign   Prostate cancer ([place])   Depression   Rheumatoid arthritis involving both hands ([place])   Essential hypertension  Acute hypoxic respiratory failure: Secondary to multifocal pneumonia.  Intubation 4/8//2020, extubated 08/03/2018.  Still remains on high flow oxygen.  Currently on 15 L. Continue aggressive bronchodilator therapy, chest physiotherapy and PT OT. COVID-19 test x2 was negative CTA shows extensive scarring, bronchiectasis and acute on chronic changes probably has underlying interstitial lung disease.  He finished 10 days of IV antibiotic therapy. Continue IV diuresis, continue aggressive steroid therapy. Will start chest physiotherapy with flutter valve.  Continue bronchodilator therapy. He was treated with Plaquenil.  Continue with zinc and vitamin C. Because of high suspicion, will keep on isolation precautions.    Left lung pneumothorax: Chest tube was removed on 08/04/2018.  Repeat x-ray was with improvement.  CT scan with no evidence of pneumothorax.  Rheumatoid  arthritis: On chronic prednisone therapy that has been continued and increased to high-dose Solu-Medrol.  Will hold methotrexate in the setting of acute infection.  Hypertension: Fairly stable.  Hypokalemia: Improved with replacement.   DVT prophylaxis: Lovenox subcu Code Status: Partial Family Communication: Patient's wife on the phone. Disposition Plan: Unknown at this time.  Patient will benefit with inpatient physical therapy once his oxygen requirement improves. Stay on stepdown unit because of high flow oxygen.   Consultants:   PCCM  Procedures:   Intubation 4/8-4/10  Left IJ central line 4/8  Chest tube left, 4/8-4/11  Antimicrobials:   Azithromycin 500 mg IV, 07/31/2018/-  Ceftriaxone 1 g every 24 hours, 07/31/2018-   Subjective: Patient was seen and examined.  No overnight events.  Remains on 15 L of oxygen.  Afebrile.  Patient himself feels little bit stronger and better today.  Objective: Vitals:   08/10/18 0700 08/10/18 0722 08/10/18 0800 08/10/18 0807  BP: (!) 112/59  120/68   Pulse: 71 69 90 83  Resp: (!) 25 19 (!) 32 20  Temp:   (!) 97.1 F (36.2 C)   TempSrc:   Axillary   SpO2: 94% 95% (!) 71% 92%  Weight:      Height:        Intake/Output Summary (Last 24 hours) at 08/10/2018 0950 Last data filed at 08/10/2018 1950 Gross per 24 hour  Intake 1320 ml  Output 2950 ml  Net -1630 ml   Filed Weights   08/08/18 0438 08/09/18 0336 08/10/18 0500  Weight: 58.9 kg 67.2 kg 61 kg    Examination:  General exam: Appears calm and comfortable , he is not in any obvious distress though he is on very high flow oxygen.  He was  sitting in couch. Respiratory system: Clear to auscultation. Respiratory effort normal.  He has poor air entry bilateral, however no additional sounds. Cardiovascular system: S1 & S2 heard, RRR. No JVD, murmurs, rubs, gallops or clicks. No pedal edema. Gastrointestinal system: Abdomen is nondistended, soft and nontender. No  organomegaly or masses felt. Normal bowel sounds heard. Central nervous system: Alert and oriented. No focal neurological deficits. Extremities: Symmetric 5 x 5 power. Skin: No rashes, lesions or ulcers Psychiatry: Judgement and insight appear normal. Mood & affect appropriate.     Data Reviewed: I have personally reviewed following labs and imaging studies  CBC: Recent Labs  Lab 08/04/18 0305 08/05/18 0502 08/07/18 0409 08/08/18 0421 08/09/18 0350 08/10/18 0500  WBC 12.7* 9.6 12.1* 13.8* 16.0* 10.7*  NEUTROABS 9.2* 6.7  --   --   --  9.4*  HGB 9.8* 9.7* 11.2* 10.4* 10.8* 11.0*  HCT 31.7* 30.8* 35.2* 33.6* 34.0* 34.4*  MCV 94.1 93.1 93.1 92.3 92.4 91.5  PLT 313 238 361 348 351 008   Basic Metabolic Panel: Recent Labs  Lab 08/04/18 0305 08/05/18 0502 08/07/18 0409 08/08/18 0421 08/09/18 0350 08/10/18 0500  NA 135 138 139 135 136 135  K 3.8 3.8 4.1 3.2* 3.7 3.7  CL 104 104 101 98 100 95*  CO2 '22 26 27 29 28 29  ' GLUCOSE 90 90 81 94 95 150*  BUN 25* '21 19 18 ' 24* 26*  CREATININE 1.05 0.97 0.99 0.87 1.05 1.09  CALCIUM 8.8* 8.6* 9.2 8.5* 8.7* 9.0  MG 2.1 2.1  --  1.9  --   --   PHOS 2.8 2.9  --   --   --   --    GFR: Estimated Creatinine Clearance: 52.1 mL/min (by C-G formula based on SCr of 1.09 mg/dL). Liver Function Tests: Recent Labs  Lab 08/04/18 0305 08/07/18 0409 08/08/18 0421 08/09/18 0350 08/10/18 0500  AST 14* '24 19 19 19  ' ALT 15 32 32 33 35  ALKPHOS 43 55 51 56 54  BILITOT 0.4 0.6 0.4 0.5 0.5  PROT 6.1* 6.5 6.0* 6.3* 6.9  ALBUMIN 2.8* 2.8* 2.6* 2.7* 3.1*   No results for input(s): LIPASE, AMYLASE in the last 168 hours. No results for input(s): AMMONIA in the last 168 hours. Coagulation Profile: No results for input(s): INR, PROTIME in the last 168 hours. Cardiac Enzymes: Recent Labs  Lab 08/04/18 0305  CKTOTAL 56   BNP (last 3 results) No results for input(s): PROBNP in the last 8760 hours. HbA1C: No results for input(s): HGBA1C in the  last 72 hours. CBG: Recent Labs  Lab 08/03/18 1107  GLUCAP 124*   Lipid Profile: No results for input(s): CHOL, HDL, LDLCALC, TRIG, CHOLHDL, LDLDIRECT in the last 72 hours. Thyroid Function Tests: No results for input(s): TSH, T4TOTAL, FREET4, T3FREE, THYROIDAB in the last 72 hours. Anemia Panel: Recent Labs    08/08/18 0422  FERRITIN 235   Sepsis Labs: Recent Labs  Lab 08/07/18 0409 08/08/18 0421  PROCALCITON <0.10 <0.10    Recent Results (from the past 240 hour(s))  MRSA PCR Screening     Status: None   Collection Time: 08/01/18 11:57 PM  Result Value Ref Range Status   MRSA by PCR NEGATIVE NEGATIVE Final    Comment:        The GeneXpert MRSA Assay (FDA approved for NASAL specimens only), is one component of a comprehensive MRSA colonization surveillance program. It is not intended to diagnose MRSA infection nor to guide or  monitor treatment for MRSA infections. Performed at [facility], [place] [address] [place], [place] [address]   SARS Coronavirus 2 [facility] order, Performed in [facility] hospital lab)     Status: None   Collection Time: 08/08/18  9:44 AM  Result Value Ref Range Status   SARS Coronavirus 2 NEGATIVE NEGATIVE Final    Comment: (NOTE) If result is NEGATIVE SARS-CoV-2 target nucleic acids are NOT DETECTED. The SARS-CoV-2 RNA is generally detectable in upper and lower  respiratory specimens during the acute phase of infection. The lowest  concentration of SARS-CoV-2 viral copies this assay can detect is 250  copies / mL. A negative result does not preclude SARS-CoV-2 infection  and should not be used as the sole basis for treatment or other  patient management decisions.  A negative result may occur with  improper specimen collection / handling, submission of specimen other  than nasopharyngeal swab, presence of viral mutation(s) within the  areas targeted by this assay, and inadequate number of viral copies  (<250 copies  / mL). A negative result must be combined with clinical  observations, patient history, and epidemiological information. If result is POSITIVE SARS-CoV-2 target nucleic acids are DETECTED. The SARS-CoV-2 RNA is generally detectable in upper and lower  respiratory specimens dur ing the acute phase of infection.  Positive  results are indicative of active infection with SARS-CoV-2.  Clinical  correlation with patient history and other diagnostic information is  necessary to determine patient infection status.  Positive results do  not rule out bacterial infection or co-infection with other viruses. If result is PRESUMPTIVE POSTIVE SARS-CoV-2 nucleic acids MAY BE PRESENT.   A presumptive positive result was obtained on the submitted specimen  and confirmed on repeat testing.  While 2019 novel coronavirus  (SARS-CoV-2) nucleic acids may be present in the submitted sample  additional confirmatory testing may be necessary for epidemiological  and / or clinical management purposes  to differentiate between  SARS-CoV-2 and other Sarbecovirus currently known to infect humans.  If clinically indicated additional testing with an alternate test  methodology [phone]) is advised. The SARS-CoV-2 RNA is generally  detectable in upper and lower respiratory sp ecimens during the acute  phase of infection. The expected result is Negative. Fact Sheet for Patients:  StrictlyIdeas.no Fact Sheet for Healthcare Providers: BankingDealers.co.za This test is not yet approved or cleared by the [place] FDA and has been authorized for detection and/or diagnosis of SARS-CoV-2 by FDA under an Emergency Use Authorization (EUA).  This EUA will remain in effect (meaning this test can be used) for the duration of the COVID-19 declaration under Section 564(b)(1) of the Act, 21 U.S.C. section 360bbb-3(b)(1), unless the authorization is terminated or revoked  sooner. Performed at [facility], [place] [address], [place], [place] [address]          Radiology Studies: Ct Angio Chest Pe W Or Wo Contrast  Result Date: 08/09/2018 CLINICAL DATA:  Persistent hypoxia, negative COVID EXAM: CT ANGIOGRAPHY CHEST WITH CONTRAST TECHNIQUE: Multidetector CT imaging of the chest was performed using the standard protocol during bolus administration of intravenous contrast. Multiplanar CT image reconstructions and MIPs were obtained to evaluate the vascular anatomy. CONTRAST:  186m OMNIPAQUE IOHEXOL 350 MG/ML SOLN COMPARISON:  Chest radiographs, 08/06/2018 FINDINGS: Cardiovascular: Satisfactory opacification of the pulmonary arteries to the segmental level. No evidence of pulmonary embolism. Normal heart size. Scattered coronary artery calcifications. Aortic atherosclerosis. No pericardial effusion. Mediastinum/Nodes: No enlarged mediastinal, hilar, or  axillary lymph nodes. Thyroid gland, trachea, and esophagus demonstrate no significant findings. Lungs/Pleura: Moderate to severe pulmonary fibrosis in a pattern featuring mild traction bronchiectasis and multiple areas of bronchiolectasis and possible honeycombing, for example in left upper lobe. There is no significant apical to basal gradient. Moderate underlying emphysema. There are areas of ground-glass and consolidative airspace opacity in the right greater than left lung bases. There are areas of dense consolidation about the fissures (e.g. series 6, image 72). No pleural effusion or pneumothorax. Upper Abdomen: No acute abnormality. Musculoskeletal: No chest wall abnormality. No acute or significant osseous findings. Review of the MIP images confirms the above findings. IMPRESSION: 1.  Negative examination for pulmonary embolism. 2. Moderate to severe pulmonary fibrosis in a pattern featuring mild traction bronchiectasis and multiple areas of bronchiolectasis and possible honeycombing, for example in left upper  lobe. There is no significant apical to basal gradient. Moderate underlying emphysema. 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 There are areas of dense consolidation about the fissures (e.g. series 6, image 72), which are likely related to chronic scarring. Recommend follow-up CT for these areas in 3 months to exclude underlying mass. 3. There are areas of ground-glass and consolidative airspace opacity in the right greater than left lung bases, consistent with multifocal infection. 4.  Coronary artery disease and aortic atherosclerosis. Electronically Signed   By: Eddie Candle M.D.   On: 08/09/2018 10:56        Scheduled Meds:  atorvastatin  10 mg Per Tube QHS   chlorhexidine gluconate (MEDLINE KIT)  15 mL Mouth Rinse BID   Chlorhexidine Gluconate Cloth  6 each Topical Daily   enoxaparin (LOVENOX) injection  40 mg Subcutaneous Q24H   escitalopram  10 mg Oral Daily   feeding supplement (ENSURE ENLIVE)  237 mL Oral H72B   folic acid  1 mg Oral Daily   furosemide  60 mg Intravenous Daily   guaiFENesin  1,200 mg Oral BID   Ipratropium-Albuterol  2 puff Inhalation Q6H   latanoprost  1 drop Both Eyes QHS   mouth rinse  15 mL Mouth Rinse BID   methylPREDNISolone (SOLU-MEDROL) injection  60 mg Intravenous Q6H   mometasone-formoterol  2 puff Inhalation BID   multivitamin with minerals  1 tablet Oral Daily   pantoprazole  40 mg Oral Daily   sodium chloride flush  10-40 mL Intracatheter Q12H   timolol  1 drop Both Eyes Daily   vitamin C  500 mg Oral BID   zinc sulfate  220 mg Oral Daily   Continuous Infusions:  sodium chloride 50 mL (08/09/18 0805)     LOS: 11 days    Time spent: 25 minutes    Barb Merino, MD [facility] Pager [phone]  If 7PM-7AM, please contact night-coverage [email or web address] Password [email or web address] 08/10/2018, 9:50 AM

## 2018-08-10 NOTE — Progress Notes (Signed)
Physical Therapy Treatment Patient Details Name: Joel Valdez MRN: [id number] DOB: 11-14-1945 Today's Date: 08/10/2018    History of Present Illness Pt admitted 2* SOB with acute respiratory failure and intubated 07/31/08 and extubated 08/02/08.  Pt  with hx of RA, asthma, anxiety/depression, and deafness R ear    PT Comments    Pt in better spirits and very motivated.  Pt demonstrating increased activity tolerance with ambulation in room to 67'.   Pt's sats mostly 89-91 but dropped to 84 briefly.  Bumped from 10 liters to 15 until he recovered then returned to 10  Follow Up Recommendations  SNF     Equipment Recommendations  None recommended by PT    Recommendations for Other Services       Precautions / Restrictions Precautions Precautions: Fall Precaution Comments: sats drop, ck w/ RN about amount of O2 to use- wants patient 90-92% and , not too high per [employer] Restrictions Weight Bearing Restrictions: No    Mobility  Bed Mobility Overal bed mobility: Needs Assistance Bed Mobility: Supine to Sit     Supine to sit: Modified independent (Device/Increase time) Sit to supine: Min assist   General bed mobility comments: very light assistance for legs back to bed  Transfers Overall transfer level: Needs assistance Equipment used: Rolling walker (2 wheeled) Transfers: Sit to/from Stand Sit to Stand: Min guard         General transfer comment: cues for hand placement  Ambulation/Gait Ambulation/Gait assistance: Min assist Gait Distance (Feet): 67 Feet Assistive device: Rolling walker (2 wheeled) Gait Pattern/deviations: Step-through pattern;Decreased step length - right;Decreased step length - left;Shuffle Gait velocity: decr   General Gait Details: Pt ambulating door<>window x 2 and back to EOB.   Stairs             Wheelchair Mobility    Modified Rankin (Stroke Patients Only)       Balance Overall balance assessment: Needs  assistance Sitting-balance support: No upper extremity supported;Feet supported Sitting balance-Leahy Scale: Fair     Standing balance support: Bilateral upper extremity supported Standing balance-Leahy Scale: Fair                              Cognition Arousal/Alertness: Awake/alert Behavior During Therapy: WFL for tasks assessed/performed Overall Cognitive Status: Within Functional Limits for tasks assessed                                        Exercises      General Comments        Pertinent Vitals/Pain Pain Assessment: No/denies pain    Home Living                      Prior Function            PT Goals (current goals can now be found in the care plan section) Acute Rehab PT Goals Patient Stated Goal: get stronger PT Goal Formulation: With patient Time For Goal Achievement: 08/17/18 Potential to Achieve Goals: Good Progress towards PT goals: Progressing toward goals    Frequency    Min 3X/week      PT Plan Current plan remains appropriate    Co-evaluation PT/OT/SLP Co-Evaluation/Treatment: Yes Reason for Co-Treatment: For patient/therapist safety PT goals addressed during session: Mobility/safety with mobility OT goals addressed during session: ADL's and  self-care      AM-PAC PT "6 Clicks" Mobility   Outcome Measure  Help needed turning from your back to your side while in a flat bed without using bedrails?: None Help needed moving from lying on your back to sitting on the side of a flat bed without using bedrails?: None Help needed moving to and from a bed to a chair (including a wheelchair)?: A Little Help needed standing up from a chair using your arms (e.g., wheelchair or bedside chair)?: A Little Help needed to walk in hospital room?: A Little Help needed climbing 3-5 steps with a railing? : A Lot 6 Click Score: 19    End of Session Equipment Utilized During Treatment: Oxygen Activity Tolerance:  Patient tolerated treatment well Patient left: in bed;with call bell/phone within reach Nurse Communication: Mobility status PT Visit Diagnosis: Unsteadiness on feet (R26.81)     Time: 6384-5364 PT Time Calculation (min) (ACUTE ONLY): 40 min  Charges:  $Gait Training: 8-22 mins                     [place] Pager [phone] Office [phone]    Kendryck Lacroix 08/10/2018, 3:54 PM

## 2018-08-10 NOTE — Progress Notes (Signed)
Occupational Therapy Treatment Patient Details Name: Joel Valdez. Joel Valdez MRN: [id number] DOB: [birth year] Today's Date: 08/10/2018    History of present illness Pt admitted 2* SOB with acute respiratory failure and intubated 07/31/08 and extubated 08/02/08.  Pt  with hx of RA, asthma, anxiety/depression, and deafness R ear   OT comments  Ambulated to sink and performed 2 grooming activities in standing. Sat for 3rd.  Walked around in room.  Pt's sats mostly 89-91 but dropped to 84 briefly.  Bumped from 10 liters to 15 until he recovered then returned to 10   Follow Up Recommendations  Supervision/Assistance - 24 hour    Equipment Recommendations  3 in 1 bedside commode    Recommendations for Other Services      Precautions / Restrictions Precautions Precautions: Fall Precaution Comments: sats drop, ck w/ RN about amount of O2 to use- wants patient 90-92% and , not too high per RN Restrictions Weight Bearing Restrictions: No       Mobility Bed Mobility         Supine to sit: Modified independent (Device/Increase time) Sit to supine: Min assist   General bed mobility comments: very light assistance for legs back to bed  Transfers   Equipment used: Rolling walker (2 wheeled)   Sit to Stand: Min guard         General transfer comment: cues for hand placement    Balance                                           ADL either performed or assessed with clinical judgement   ADL       Grooming: Wash/dry hands;Wash/dry face;Brushing hair;Standing;Min Teacher, music: Ambulation;BSC;RW;Minimal assistance             General ADL Comments: stood for 2 grooming tasks, then sat for combing hair     Vision       Perception     Praxis      Cognition Arousal/Alertness: Awake/alert Behavior During Therapy: WFL for tasks assessed/performed Overall Cognitive Status: Within Functional Limits for tasks assessed                                           Exercises     Shoulder Instructions       General Comments  HR mostly 89-91; briefly 84%, l0 liters (15 to recover). RR briefly up to 44; mostly in 20-30s.     Pertinent Vitals/ Pain       Pain Assessment: No/denies pain  Home Living                                          Prior Functioning/Environment              Frequency  Min 2X/week        Progress Toward Goals  OT Goals(current goals can now be found in the care plan section)  Progress towards OT goals: Progressing toward goals     Plan      Co-evaluation    PT/OT/SLP Co-Evaluation/Treatment: Yes  Reason for Co-Treatment: For patient/therapist safety PT goals addressed during session: Mobility/safety with mobility OT goals addressed during session: ADL's and self-care      AM-PAC OT "6 Clicks" Daily Activity     Outcome Measure   Help from another person eating meals?: None Help from another person taking care of personal grooming?: A Little Help from another person toileting, which includes using toliet, bedpan, or urinal?: A Lot Help from another person bathing (including washing, rinsing, drying)?: A Lot Help from another person to put on and taking off regular upper body clothing?: A Lot Help from another person to put on and taking off regular lower body clothing?: Total 6 Click Score: 14    End of Session    OT Visit Diagnosis: Unsteadiness on feet (R26.81);Muscle weakness (generalized) (M62.81)   Activity Tolerance Patient limited by fatigue   Patient Left in bed;with call bell/phone within reach   Nurse Communication          Time: 1829-9371 OT Time Calculation (min): 39 min  Charges: OT General Charges $OT Visit: 1 Visit OT Treatments $Self Care/Home Management : 8-22 mins  Lesle Chris, OTR/L Acute Rehabilitation Services [phone] [facility] pager [phone]  office 08/10/2018   [place] 08/10/2018, 3:36 PM

## 2018-08-11 LAB — COMPREHENSIVE METABOLIC PANEL
ALT: 37 U/L (ref 0–44)
AST: 25 U/L (ref 15–41)
Albumin: 2.8 g/dL — ABNORMAL LOW (ref 3.5–5.0)
Alkaline Phosphatase: 49 U/L (ref 38–126)
Anion gap: 11 (ref 5–15)
BUN: 37 mg/dL — ABNORMAL HIGH (ref 8–23)
CO2: 27 mmol/L (ref 22–32)
Calcium: 8.5 mg/dL — ABNORMAL LOW (ref 8.9–10.3)
Chloride: 90 mmol/L — ABNORMAL LOW (ref 98–111)
Creatinine, Ser: 1.17 mg/dL (ref 0.61–1.24)
GFR calc Af Amer: >60 mL/min (ref >60)
GFR calc non Af Amer: >60 mL/min (ref >60)
Glucose, Bld: 167 mg/dL — ABNORMAL HIGH (ref 70–99)
Potassium: 3.1 mmol/L — ABNORMAL LOW (ref 3.5–5.1)
Sodium: 128 mmol/L — ABNORMAL LOW (ref 135–145)
Total Bilirubin: 0.6 mg/dL (ref 0.3–1.2)
Total Protein: 6.4 g/dL — ABNORMAL LOW (ref 6.5–8.1)

## 2018-08-11 MED ORDER — POTASSIUM CHLORIDE CRYS ER 20 MEQ PO TBCR
40.0000 meq | EXTENDED_RELEASE_TABLET | Freq: Two times a day (BID) | ORAL | Status: DC
  Administered 2018-08-11 – 2018-08-15 (×9): 40 meq via ORAL
  Filled 2018-08-11 (×9): qty 2

## 2018-08-11 MED ORDER — POTASSIUM CHLORIDE CRYS ER 20 MEQ PO TBCR: 20.0000 meq | EXTENDED_RELEASE_TABLET | Freq: Two times a day (BID) | ORAL | Status: DC

## 2018-08-11 MED ORDER — IPRATROPIUM-ALBUTEROL 20-100 MCG/ACT IN AERS
2.0000 | INHALATION_SPRAY | Freq: Three times a day (TID) | RESPIRATORY_TRACT | Status: DC
  Administered 2018-08-11 – 2018-08-15 (×12): 2 via RESPIRATORY_TRACT

## 2018-08-11 NOTE — Progress Notes (Addendum)
Isolation d/c'd per Dr Sloan Leiter, charge RN and AD notified

## 2018-08-11 NOTE — Progress Notes (Signed)
PROGRESS NOTE    Joel Valdez  QPY:[id number] DOB: Apr 13, 1946 DOA: 07/30/2018 PCP: [name], PA-C    Brief Narrative:  Patient 73 year old gentleman with multiple comorbidities and immunosuppression that includes prostate cancer, rheumatoid arthritis, on chronic prednisone therapy and methotrexate therapy, GERD, hypertension and depression. He has h/o tuberculosis, Asbestosis. Patient was admitted with respiratory failure with bilateral infiltrate.  He was initially intubated and then extubated on 08/03/2018.  He also developed a small pneumothorax that was treated with chest tube which is discontinued now.  COVID 19 x 2 negative.  CT scan of the chest shows extensive fibrotic changes and consolidations. 08/11/2018, oxygen requirement improving.  Now on 7 L.   Assessment & Plan:   Principal Problem:   Pneumonia due to COVID-19 virus Active Problems:   Glaucoma   Essential hypertension, benign   Prostate cancer ([place])   Depression   Rheumatoid arthritis involving both hands ([place])   Essential hypertension  Acute hypoxic respiratory failure: Secondary to multifocal pneumonia.  Intubation 4/8//2020, extubated 08/03/2018.  Still remains on high flow oxygen.  Continue aggressive bronchodilator therapy, chest physiotherapy and PT/ OT. COVID-19 test x2 was negative CTA shows extensive scarring, bronchiectasis and acute on chronic changes probably has underlying interstitial lung disease.  He finished 10 days of IV antibiotic therapy. Continue IV diuresis, continue aggressive steroid therapy. chest physiotherapy with flutter valve.  Continue bronchodilator therapy. He was treated with Plaquenil.  Continue with zinc and vitamin C. Because of high suspicion, will keep on isolation precautions.  Patient does have alternative explanation for hypoxia and COVID-19 test negative x2.  Will discuss with infectious disease to discontinue airborne precautions.  Suspected COVID-19 infection:  Ruled out. Patient has alternate explanation of hypoxia.  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  Very good response to IV steroids.  His oxygen requirement has improved. COVID-19 test was negative x2. Patient has remained afebrile for last 1 week. Initially kept on airborne isolation because of high index of suspicion, given presence of alternative explanation, I discussed case with infectious disease.  Will discontinue his airborne and isolation precautions.   Left lung pneumothorax: Chest tube was removed on 08/04/2018.  Repeat x-ray was with improvement.  CT scan with no evidence of pneumothorax.  Rheumatoid arthritis: On chronic prednisone therapy that has been continued and increased to high-dose Solu-Medrol.  Will hold methotrexate in the setting of acute infection. Continue high-dose steroids today.  We will gradually taper off.  Hypertension: Fairly stable.  Hypokalemia:  Will keep on a schedule replacement when he is on Lasix.  Will check potassium and magnesium in the morning.   DVT prophylaxis: Lovenox subcu Code Status: Partial Family Communication: Patient's wife on the phone. Disposition Plan: Home when oxygen requirement improves less than 4 L.   Consultants:   PCCM, signed off.  Procedures:   Intubation 4/8-4/10  Left IJ central line 4/8  Chest tube left, 4/8-4/11  Antimicrobials:   Azithromycin 500 mg IV, 07/31/2018/- 08/10/2018  Ceftriaxone 1 g every 24 hours, 07/31/2018- 08/10/2018   Subjective: Patient was seen and examined.  No overnight events.  Remains on 7 L of oxygen.  Afebrile.  Patient himself feels little bit stronger and better today. He was very happy to hear that he may be going home with some oxygen in the next few days.  Objective: Vitals:   08/11/18 0700 08/11/18 0800 08/11/18 0900 08/11/18 1000  BP: 121/61 112/68 (!) 104/58 (!) 106/58  Pulse: (!) 55 Marland Kitchen)  59 63 60  Resp: 19 (!) 22 (!) 23 (!) 31  Temp:  (!) 97.1 F  (36.2 C)    TempSrc:  Oral    SpO2: 99% 97% 95% 96%  Weight:      Height:        Intake/Output Summary (Last 24 hours) at 08/11/2018 1051 Last data filed at 08/11/2018 0800 Gross per 24 hour  Intake 720 ml  Output 2050 ml  Net -1330 ml   Filed Weights   08/09/18 0336 08/10/18 0500 08/11/18 0241  Weight: 67.2 kg 61 kg 61.2 kg    Examination:  General exam: Appears calm and comfortable , he is not in any obvious distress. He was sitting in couch. Respiratory system: Clear to auscultation. Respiratory effort normal.  He has poor air entry bilateral, however no additional sounds. Cardiovascular system: S1 & S2 heard, RRR. No JVD, murmurs, rubs, gallops or clicks. No pedal edema. Gastrointestinal system: Abdomen is nondistended, soft and nontender. No organomegaly or masses felt. Normal bowel sounds heard. Central nervous system: Alert and oriented. No focal neurological deficits. Extremities: Symmetric 5 x 5 power. Skin: No rashes, lesions or ulcers Psychiatry: Judgement and insight appear normal. Mood & affect appropriate.     Data Reviewed: I have personally reviewed following labs and imaging studies  CBC: Recent Labs  Lab 08/05/18 0502 08/07/18 0409 08/08/18 0421 08/09/18 0350 08/10/18 0500  WBC 9.6 12.1* 13.8* 16.0* 10.7*  NEUTROABS 6.7  --   --   --  9.4*  HGB 9.7* 11.2* 10.4* 10.8* 11.0*  HCT 30.8* 35.2* 33.6* 34.0* 34.4*  MCV 93.1 93.1 92.3 92.4 91.5  PLT 238 361 348 351 378   Basic Metabolic Panel: Recent Labs  Lab 08/05/18 0502 08/07/18 0409 08/08/18 0421 08/09/18 0350 08/10/18 0500 08/11/18 0236  NA 138 139 135 136 135 128*  K 3.8 4.1 3.2* 3.7 3.7 3.1*  CL 104 101 98 100 95* 90*  CO2 '26 27 29 28 29 27  ' GLUCOSE 90 81 94 95 150* 167*  BUN '21 19 18 ' 24* 26* 37*  CREATININE 0.97 0.99 0.87 1.05 1.09 1.17  CALCIUM 8.6* 9.2 8.5* 8.7* 9.0 8.5*  MG 2.1  --  1.9  --   --   --   PHOS 2.9  --   --   --   --   --    GFR: Estimated Creatinine Clearance:  48.7 mL/min (by C-G formula based on SCr of 1.17 mg/dL). Liver Function Tests: Recent Labs  Lab 08/07/18 0409 08/08/18 0421 08/09/18 0350 08/10/18 0500 08/11/18 0236  AST '24 19 19 19 25  ' ALT 32 32 33 35 37  ALKPHOS 55 51 56 54 49  BILITOT 0.6 0.4 0.5 0.5 0.6  PROT 6.5 6.0* 6.3* 6.9 6.4*  ALBUMIN 2.8* 2.6* 2.7* 3.1* 2.8*   No results for input(s): LIPASE, AMYLASE in the last 168 hours. No results for input(s): AMMONIA in the last 168 hours. Coagulation Profile: No results for input(s): INR, PROTIME in the last 168 hours. Cardiac Enzymes: No results for input(s): CKTOTAL, CKMB, CKMBINDEX, TROPONINI in the last 168 hours. BNP (last 3 results) No results for input(s): PROBNP in the last 8760 hours. HbA1C: No results for input(s): HGBA1C in the last 72 hours. CBG: No results for input(s): GLUCAP in the last 168 hours. Lipid Profile: No results for input(s): CHOL, HDL, LDLCALC, TRIG, CHOLHDL, LDLDIRECT in the last 72 hours. Thyroid Function Tests: No results for input(s): TSH, T4TOTAL, FREET4, T3FREE, THYROIDAB  in the last 72 hours. Anemia Panel: No results for input(s): VITAMINB12, FOLATE, FERRITIN, TIBC, IRON, RETICCTPCT in the last 72 hours. Sepsis Labs: Recent Labs  Lab 08/07/18 0409 08/08/18 0421  PROCALCITON <0.10 <0.10    Recent Results (from the past 240 hour(s))  MRSA PCR Screening     Status: None   Collection Time: 08/01/18 11:57 PM  Result Value Ref Range Status   MRSA by PCR NEGATIVE NEGATIVE Final    Comment:        The GeneXpert MRSA Assay (FDA approved for NASAL specimens only), is one component of a comprehensive MRSA colonization surveillance program. It is not intended to diagnose MRSA infection nor to guide or monitor treatment for MRSA infections. Performed at [facility], [place] [address] [place], [place] [address]   SARS Coronavirus 2 [facility] order, Performed in [facility] hospital lab)     Status: None   Collection  Time: 08/08/18  9:44 AM  Result Value Ref Range Status   SARS Coronavirus 2 NEGATIVE NEGATIVE Final    Comment: (NOTE) If result is NEGATIVE SARS-CoV-2 target nucleic acids are NOT DETECTED. The SARS-CoV-2 RNA is generally detectable in upper and lower  respiratory specimens during the acute phase of infection. The lowest  concentration of SARS-CoV-2 viral copies this assay can detect is 250  copies / mL. A negative result does not preclude SARS-CoV-2 infection  and should not be used as the sole basis for treatment or other  patient management decisions.  A negative result may occur with  improper specimen collection / handling, submission of specimen other  than nasopharyngeal swab, presence of viral mutation(s) within the  areas targeted by this assay, and inadequate number of viral copies  (<250 copies / mL). A negative result must be combined with clinical  observations, patient history, and epidemiological information. If result is POSITIVE SARS-CoV-2 target nucleic acids are DETECTED. The SARS-CoV-2 RNA is generally detectable in upper and lower  respiratory specimens dur ing the acute phase of infection.  Positive  results are indicative of active infection with SARS-CoV-2.  Clinical  correlation with patient history and other diagnostic information is  necessary to determine patient infection status.  Positive results do  not rule out bacterial infection or co-infection with other viruses. If result is PRESUMPTIVE POSTIVE SARS-CoV-2 nucleic acids MAY BE PRESENT.   A presumptive positive result was obtained on the submitted specimen  and confirmed on repeat testing.  While 2019 novel coronavirus  (SARS-CoV-2) nucleic acids may be present in the submitted sample  additional confirmatory testing may be necessary for epidemiological  and / or clinical management purposes  to differentiate between  SARS-CoV-2 and other Sarbecovirus currently known to infect humans.  If  clinically indicated additional testing with an alternate test  methodology [phone]) is advised. The SARS-CoV-2 RNA is generally  detectable in upper and lower respiratory sp ecimens during the acute  phase of infection. The expected result is Negative. Fact Sheet for Patients:  StrictlyIdeas.no Fact Sheet for Healthcare Providers: BankingDealers.co.za This test is not yet approved or cleared by the [place] FDA and has been authorized for detection and/or diagnosis of SARS-CoV-2 by FDA under an Emergency Use Authorization (EUA).  This EUA will remain in effect (meaning this test can be used) for the duration of the COVID-19 declaration under Section 564(b)(1) of the Act, 21 U.S.C. section 360bbb-3(b)(1), unless the authorization is terminated or revoked sooner. Performed at [facility], [place]  [address] [place], [place] [address]          Radiology Studies: No results found.      Scheduled Meds: . atorvastatin  10 mg Per Tube QHS  . chlorhexidine gluconate (MEDLINE KIT)  15 mL Mouth Rinse BID  . Chlorhexidine Gluconate Cloth  6 each Topical Daily  . enoxaparin (LOVENOX) injection  40 mg Subcutaneous Q24H  . escitalopram  10 mg Oral Daily  . feeding supplement (ENSURE ENLIVE)  237 mL Oral Q24H  . folic acid  1 mg Oral Daily  . furosemide  60 mg Intravenous Daily  . guaiFENesin  1,200 mg Oral BID  . Ipratropium-Albuterol  2 puff Inhalation TID  . latanoprost  1 drop Both Eyes QHS  . mouth rinse  15 mL Mouth Rinse BID  . methylPREDNISolone (SOLU-MEDROL) injection  60 mg Intravenous Q6H  . mometasone-formoterol  2 puff Inhalation BID  . multivitamin with minerals  1 tablet Oral Daily  . pantoprazole  40 mg Oral Daily  . potassium chloride  40 mEq Oral BID  . sodium chloride flush  10-40 mL Intracatheter Q12H  . timolol  1 drop Both Eyes Daily  . vitamin C  500 mg Oral BID  . zinc sulfate  220 mg Oral Daily    Continuous Infusions: . sodium chloride 50 mL (08/09/18 0805)     LOS: 12 days    Time spent: 25 minutes    Barb Merino, MD [facility]  Pager [phone]  If 7PM-7AM, please contact night-coverage [email or web address] Password [email or web address] 08/11/2018, 10:51 AM

## 2018-08-11 NOTE — Progress Notes (Signed)
Physical Therapy Treatment Patient Details Name: Joel Valdez MRN: [id number] DOB: 11-21-1945 Today's Date: 08/11/2018    History of Present Illness Pt admitted 2* SOB with acute respiratory failure and intubated 07/31/08 and extubated 08/02/08.  Pt  with hx of RA, asthma, anxiety/depression, and deafness R ear    PT Comments    Pt continues very motivated to progress.  Pt to EOB sitting unassisted and balanced well to comb hair but than began to desat on 8L and unable to regain with O2 turned up higher.  Pt back to bed with c/o lightheadedness and remained on 10L ~ 5 min before sats began to elevate.  Pt regained to 94%, titrated back down to 7L and able to maintain sats - Rn advised.   Follow Up Recommendations  SNF(Pt would prefer home if progresses enough with acute stay)     Equipment Recommendations  Other (comment)(TBD)    Recommendations for Other Services       Precautions / Restrictions Precautions Precautions: Fall Precaution Comments: monitor sats Restrictions Weight Bearing Restrictions: No    Mobility  Bed Mobility Overal bed mobility: Needs Assistance Bed Mobility: Supine to Sit;Sit to Supine     Supine to sit: Modified independent (Device/Increase time) Sit to supine: Modified independent (Device/Increase time)      Transfers                 General transfer comment: NT 2* to pt desating in sitting and with c/o lightheadedness  Ambulation/Gait                 Stairs             Wheelchair Mobility    Modified Rankin (Stroke Patients Only)       Balance Overall balance assessment: Needs assistance Sitting-balance support: No upper extremity supported;Feet supported Sitting balance-Leahy Scale: Good                                      Cognition Arousal/Alertness: Awake/alert Behavior During Therapy: WFL for tasks assessed/performed Overall Cognitive Status: Within Functional Limits for tasks assessed                                  General Comments: Pt fatigued but motivated to move      Exercises      General Comments        Pertinent Vitals/Pain Pain Assessment: No/denies pain    Home Living                      Prior Function            PT Goals (current goals can now be found in the care plan section) Acute Rehab PT Goals Patient Stated Goal: get stronger PT Goal Formulation: With patient Time For Goal Achievement: 08/17/18 Potential to Achieve Goals: Good Progress towards PT goals: Not progressing toward goals - comment(desating with sitting this am)    Frequency    Min 3X/week      PT Plan Current plan remains appropriate    Co-evaluation              AM-PAC PT "6 Clicks" Mobility   Outcome Measure  Help needed turning from your back to your side while in a flat bed without using bedrails?: None Help needed  moving from lying on your back to sitting on the side of a flat bed without using bedrails?: None Help needed moving to and from a bed to a chair (including a wheelchair)?: A Little Help needed standing up from a chair using your arms (e.g., wheelchair or bedside chair)?: A Little Help needed to walk in hospital room?: A Little Help needed climbing 3-5 steps with a railing? : A Lot 6 Click Score: 19    End of Session Equipment Utilized During Treatment: Oxygen Activity Tolerance: Patient tolerated treatment well Patient left: in bed;with call bell/phone within reach Nurse Communication: Mobility status PT Visit Diagnosis: Unsteadiness on feet (R26.81)     Time: 2103-1281 PT Time Calculation (min) (ACUTE ONLY): 37 min  Charges:  $Therapeutic Activity: 23-37 mins                     [place] Pager [phone] Office [phone]    Augusta Hilbert 08/11/2018, 10:56 AM

## 2018-08-12 LAB — CBC WITH DIFFERENTIAL/PLATELET
Abs Immature Granulocytes: 0.31 10*3/uL — ABNORMAL HIGH (ref 0.00–0.07)
Basophils Absolute: 0 10*3/uL (ref 0.0–0.1)
Basophils Relative: 0 %
Eosinophils Absolute: 0 10*3/uL (ref 0.0–0.5)
Eosinophils Relative: 0 %
HCT: 32.1 % — ABNORMAL LOW (ref 39.0–52.0)
Hemoglobin: 10.1 g/dL — ABNORMAL LOW (ref 13.0–17.0)
Immature Granulocytes: 1 %
Lymphocytes Relative: 5 %
Lymphs Abs: 1.2 10*3/uL (ref 0.7–4.0)
MCH: 28.5 pg (ref 26.0–34.0)
MCHC: 31.5 g/dL (ref 30.0–36.0)
MCV: 90.4 fL (ref 80.0–100.0)
Monocytes Absolute: 0.4 10*3/uL (ref 0.1–1.0)
Monocytes Relative: 2 %
Neutro Abs: 20.3 10*3/uL — ABNORMAL HIGH (ref 1.7–7.7)
Neutrophils Relative %: 92 %
Platelets: 392 10*3/uL (ref 150–400)
RBC: 3.55 MIL/uL — ABNORMAL LOW (ref 4.22–5.81)
RDW: 15.6 % — ABNORMAL HIGH (ref 11.5–15.5)
WBC: 22.3 10*3/uL — ABNORMAL HIGH (ref 4.0–10.5)
nRBC: 0 % (ref 0.0–0.2)

## 2018-08-12 LAB — BASIC METABOLIC PANEL
Anion gap: 13 (ref 5–15)
BUN: 45 mg/dL — ABNORMAL HIGH (ref 8–23)
CO2: 25 mmol/L (ref 22–32)
Calcium: 8.6 mg/dL — ABNORMAL LOW (ref 8.9–10.3)
Chloride: 94 mmol/L — ABNORMAL LOW (ref 98–111)
Creatinine, Ser: 1.37 mg/dL — ABNORMAL HIGH (ref 0.61–1.24)
GFR calc Af Amer: 59 mL/min — ABNORMAL LOW (ref >60)
GFR calc non Af Amer: 51 mL/min — ABNORMAL LOW (ref >60)
Glucose, Bld: 152 mg/dL — ABNORMAL HIGH (ref 70–99)
Potassium: 3.9 mmol/L (ref 3.5–5.1)
Sodium: 132 mmol/L — ABNORMAL LOW (ref 135–145)

## 2018-08-12 LAB — MAGNESIUM: Magnesium: 2.3 mg/dL (ref 1.7–2.4)

## 2018-08-12 MED ORDER — FUROSEMIDE 10 MG/ML IJ SOLN
40.0000 mg | Freq: Every day | INTRAMUSCULAR | Status: DC
  Administered 2018-08-13: 40 mg via INTRAVENOUS
  Filled 2018-08-12 (×3): qty 4

## 2018-08-12 MED ORDER — METHYLPREDNISOLONE SODIUM SUCC 125 MG IJ SOLR
60.0000 mg | Freq: Two times a day (BID) | INTRAMUSCULAR | Status: DC
  Administered 2018-08-12 – 2018-08-13 (×2): 60 mg via INTRAVENOUS
  Filled 2018-08-12 (×3): qty 2

## 2018-08-12 NOTE — Progress Notes (Signed)
Dr Sloan Leiter made aware that pt had 10 beat run of v-tach

## 2018-08-12 NOTE — Progress Notes (Signed)
Pt stable at time of bedside rounding. No needs at this time. No changes to note overall. Pt remains on 5lnc sating wnl. Pt continues to get short of breath with exertion. Will continue to monitor.

## 2018-08-12 NOTE — Progress Notes (Signed)
PROGRESS NOTE    Joel Valdez  HFW:[id number] DOB: October 11, 1945 DOA: 07/30/2018 PCP: [name], PA-C    Brief Narrative:  Patient 73 year old gentleman with multiple comorbidities and immunosuppression that includes prostate cancer, rheumatoid arthritis, on chronic prednisone therapy and methotrexate injection therapy, GERD, hypertension and depression. He has h/o tuberculosis, Asbestosis. Patient was admitted with respiratory failure with bilateral infiltrate.  He was initially intubated and then extubated on 08/03/2018.  He also developed a small pneumothorax that was treated with chest tube which is discontinued now.  COVID 19 x 2 negative.  CT scan of the chest shows extensive fibrotic changes and consolidations. Patient initially required 15 L of oxygen, now down to 5 L today.   Assessment & Plan:   Principal Problem:   Pneumonia due to COVID-19 virus Active Problems:   Glaucoma   Essential hypertension, benign   Prostate cancer ([place])   Depression   Rheumatoid arthritis involving both hands ([place])   Essential hypertension  Acute hypoxic respiratory failure: Secondary to multifocal pneumonia.  Intubation 4/8//2020, extubated 08/03/2018.  Still remains on high flow oxygen.  Continue aggressive bronchodilator therapy, chest physiotherapy and PT/ OT. COVID-19 test x2 was negative CTA shows extensive scarring, bronchiectasis and acute on chronic changes probably has underlying interstitial lung disease.  He finished 10 days of IV antibiotic therapy. Continue IV diuresis, continue aggressive steroid therapy.  Will decrease dose of his steroids and diuretics today. chest physiotherapy with flutter valve.  Continue bronchodilator therapy. He was treated with Plaquenil.  Continue with zinc and vitamin C.  Suspected COVID-19 infection: Ruled out. Patient has alternate explanation of hypoxia.  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  Very good response to IV  steroids.  His oxygen requirement has improved. COVID-19 test was negative x2. Patient has remained afebrile for last 1 week. Initially kept on airborne isolation because of high index of suspicion, given presence of alternative explanation, I discussed case with infectious disease.  Isolation precautions discontinued.  Left lung pneumothorax: Chest tube was removed on 08/04/2018.  Repeat x-ray was with improvement.  CT scan with no evidence of pneumothorax.  Rheumatoid arthritis: On chronic prednisone therapy that has been continued and increased to high-dose Solu-Medrol.  Will hold methotrexate in the setting of acute infection. Taper off Solu-Medrol today.  Hypertension: Fairly stable.  Hypokalemia:  Will keep on a schedule replacement when he is on Lasix.  Mag and potassium are adequate today.  DVT prophylaxis: Lovenox subcu Code Status: Partial Family Communication: Patient's wife on the phone. Disposition Plan: Home when oxygen requirement improves less than 4 L.  Patient is still requiring high flow oxygen.  Keep on stepdown unit.  He can be moved to another room with no need for negative pressure.  He will need evaluation for supplemental oxygen need nearing discharge.  Anticipate discharge home next 24 to 48 hours.  Will discharge with prolonged prednisone taper.  We will also refer to pulmonology as outpatient for follow-up.   Consultants:   PCCM, signed off.  Procedures:   Intubation 4/8-4/10  Left IJ central line 4/8  Chest tube left, 4/8-4/11  Antimicrobials:   Azithromycin 500 mg IV, 07/31/2018/- 08/10/2018  Ceftriaxone 1 g every 24 hours, 07/31/2018- 08/10/2018   Subjective: Patient was seen and examined.  No overnight events.  Remains on 5-6 L of oxygen.  Afebrile.  He did not sleep well, however no new complaints. He was very happy to hear that he may be going  home with some oxygen in the next few days. WBC count is 22,000 on very high dose of Solu-Medrol.  No  evidence of infection.  Objective: Vitals:   08/12/18 0600 08/12/18 0700 08/12/18 0800 08/12/18 0900  BP: (!) 120/53 109/62 (!) 106/56   Pulse: 61 61 81 73  Resp: 18 (!) 29 (!) 22 20  Temp:   99 F (37.2 C)   TempSrc:   Oral   SpO2: 98% 97% 94% 92%  Weight:      Height:        Intake/Output Summary (Last 24 hours) at 08/12/2018 0923 Last data filed at 08/12/2018 0900 Gross per 24 hour  Intake 490 ml  Output 2650 ml  Net -2160 ml   Filed Weights   08/10/18 0500 08/11/18 0241 08/12/18 0500  Weight: 61 kg 61.2 kg 60.6 kg    Examination:  General exam: Appears calm and comfortable , he is not in any obvious distress. Respiratory system: Clear to auscultation. Respiratory effort normal.  He has poor air entry bilateral, however no additional sounds. Cardiovascular system: S1 & S2 heard, RRR. No JVD, murmurs, rubs, gallops or clicks. No pedal edema. Gastrointestinal system: Abdomen is nondistended, soft and nontender. No organomegaly or masses felt. Normal bowel sounds heard. Central nervous system: Alert and oriented. No focal neurological deficits. Extremities: Symmetric 5 x 5 power. Skin: No rashes, lesions or ulcers Psychiatry: Judgement and insight appear normal. Mood & affect appropriate.     Data Reviewed: I have personally reviewed following labs and imaging studies  CBC: Recent Labs  Lab 08/07/18 0409 08/08/18 0421 08/09/18 0350 08/10/18 0500 08/12/18 0235  WBC 12.1* 13.8* 16.0* 10.7* 22.3*  NEUTROABS  --   --   --  9.4* 20.3*  HGB 11.2* 10.4* 10.8* 11.0* 10.1*  HCT 35.2* 33.6* 34.0* 34.4* 32.1*  MCV 93.1 92.3 92.4 91.5 90.4  PLT 361 348 351 395 294   Basic Metabolic Panel: Recent Labs  Lab 08/08/18 0421 08/09/18 0350 08/10/18 0500 08/11/18 0236 08/12/18 0235  NA 135 136 135 128* 132*  K 3.2* 3.7 3.7 3.1* 3.9  CL 98 100 95* 90* 94*  CO2 _0 GLUCOSE 94 95 150* 167* 152*  BUN 18 24* 26* 37* 45*  CREATININE 0.87 1.05 1.09 1.17 1.37*    CALCIUM 8.5* 8.7* 9.0 8.5* 8.6*  MG 1.9  --   --   --  2.3   GFR: Estimated Creatinine Clearance: 41.2 mL/min (A) (by C-G formula based on SCr of 1.37 mg/dL (H)). Liver Function Tests: Recent Labs  Lab 08/07/18 0409 08/08/18 0421 08/09/18 0350 08/10/18 0500 08/11/18 0236  AST _1 ALT 32 32 33 35 37  ALKPHOS 55 51 56 54 49  BILITOT 0.6 0.4 0.5 0.5 0.6  PROT 6.5 6.0* 6.3* 6.9 6.4*  ALBUMIN 2.8* 2.6* 2.7* 3.1* 2.8*   No results for input(s): LIPASE, AMYLASE in the last 168 hours. No results for input(s): AMMONIA in the last 168 hours. Coagulation Profile: No results for input(s): INR, PROTIME in the last 168 hours. Cardiac Enzymes: No results for input(s): CKTOTAL, CKMB, CKMBINDEX, TROPONINI in the last 168 hours. BNP (last 3 results) No results for input(s): PROBNP in the last 8760 hours. HbA1C: No results for input(s): HGBA1C in the last 72 hours. CBG: No results for input(s): GLUCAP in the last 168 hours. Lipid Profile: No results for input(s): CHOL, HDL, LDLCALC, TRIG, CHOLHDL, LDLDIRECT in the last 72  hours. Thyroid Function Tests: No results for input(s): TSH, T4TOTAL, FREET4, T3FREE, THYROIDAB in the last 72 hours. Anemia Panel: No results for input(s): VITAMINB12, FOLATE, FERRITIN, TIBC, IRON, RETICCTPCT in the last 72 hours. Sepsis Labs: Recent Labs  Lab 08/07/18 0409 08/08/18 0421  PROCALCITON <0.10 <0.10    Recent Results (from the past 240 hour(s))  SARS Coronavirus 2 [facility] order, Performed in [facility] hospital lab)     Status: None   Collection Time: 08/08/18  9:44 AM  Result Value Ref Range Status   SARS Coronavirus 2 NEGATIVE NEGATIVE Final    Comment: (NOTE) If result is NEGATIVE SARS-CoV-2 target nucleic acids are NOT DETECTED. The SARS-CoV-2 RNA is generally detectable in upper and lower  respiratory specimens during the acute phase of infection. The lowest  concentration of SARS-CoV-2 viral copies this assay can detect is  250  copies / mL. A negative result does not preclude SARS-CoV-2 infection  and should not be used as the sole basis for treatment or other  patient management decisions.  A negative result may occur with  improper specimen collection / handling, submission of specimen other  than nasopharyngeal swab, presence of viral mutation(s) within the  areas targeted by this assay, and inadequate number of viral copies  (<250 copies / mL). A negative result must be combined with clinical  observations, patient history, and epidemiological information. If result is POSITIVE SARS-CoV-2 target nucleic acids are DETECTED. The SARS-CoV-2 RNA is generally detectable in upper and lower  respiratory specimens dur ing the acute phase of infection.  Positive  results are indicative of active infection with SARS-CoV-2.  Clinical  correlation with patient history and other diagnostic information is  necessary to determine patient infection status.  Positive results do  not rule out bacterial infection or co-infection with other viruses. If result is PRESUMPTIVE POSTIVE SARS-CoV-2 nucleic acids MAY BE PRESENT.   A presumptive positive result was obtained on the submitted specimen  and confirmed on repeat testing.  While 2019 novel coronavirus  (SARS-CoV-2) nucleic acids may be present in the submitted sample  additional confirmatory testing may be necessary for epidemiological  and / or clinical management purposes  to differentiate between  SARS-CoV-2 and other Sarbecovirus currently known to infect humans.  If clinically indicated additional testing with an alternate test  methodology [phone]) is advised. The SARS-CoV-2 RNA is generally  detectable in upper and lower respiratory sp ecimens during the acute  phase of infection. The expected result is Negative. Fact Sheet for Patients:  StrictlyIdeas.no Fact Sheet for Healthcare  Providers: BankingDealers.co.za This test is not yet approved or cleared by the [place] FDA and has been authorized for detection and/or diagnosis of SARS-CoV-2 by FDA under an Emergency Use Authorization (EUA).  This EUA will remain in effect (meaning this test can be used) for the duration of the COVID-19 declaration under Section 564(b)(1) of the Act, 21 U.S.C. section 360bbb-3(b)(1), unless the authorization is terminated or revoked sooner. Performed at [facility], [place] [address], [place], [place] [address]          Radiology Studies: No results found.      Scheduled Meds:  atorvastatin  10 mg Per Tube QHS   chlorhexidine gluconate (MEDLINE KIT)  15 mL Mouth Rinse BID   Chlorhexidine Gluconate Cloth  6 each Topical Daily   enoxaparin (LOVENOX) injection  40 mg Subcutaneous Q24H   escitalopram  10 mg Oral Daily   feeding supplement (ENSURE ENLIVE)  237 mL Oral F81O   folic acid  1 mg Oral Daily   [START ON 08/13/2018] furosemide  40 mg Intravenous Daily   guaiFENesin  1,200 mg Oral BID   Ipratropium-Albuterol  2 puff Inhalation TID   latanoprost  1 drop Both Eyes QHS   mouth rinse  15 mL Mouth Rinse BID   methylPREDNISolone (SOLU-MEDROL) injection  60 mg Intravenous Q12H   mometasone-formoterol  2 puff Inhalation BID   multivitamin with minerals  1 tablet Oral Daily   pantoprazole  40 mg Oral Daily   potassium chloride  40 mEq Oral BID   sodium chloride flush  10-40 mL Intracatheter Q12H   timolol  1 drop Both Eyes Daily   vitamin C  500 mg Oral BID   zinc sulfate  220 mg Oral Daily   Continuous Infusions:  sodium chloride 50 mL (08/09/18 0805)     LOS: 13 days    Time spent: 25 minutes    Barb Merino, MD [facility]  Pager [phone]  If 7PM-7AM, please contact night-coverage [email or web address] Password [facility] 08/12/2018, 9:23 AM

## 2018-08-13 LAB — BASIC METABOLIC PANEL
Anion gap: 11 (ref 5–15)
BUN: 43 mg/dL — ABNORMAL HIGH (ref 8–23)
CO2: 26 mmol/L (ref 22–32)
Calcium: 8.4 mg/dL — ABNORMAL LOW (ref 8.9–10.3)
Chloride: 96 mmol/L — ABNORMAL LOW (ref 98–111)
Creatinine, Ser: 1.16 mg/dL (ref 0.61–1.24)
GFR calc Af Amer: >60 mL/min (ref >60)
GFR calc non Af Amer: >60 mL/min (ref >60)
Glucose, Bld: 203 mg/dL — ABNORMAL HIGH (ref 70–99)
Potassium: 3.5 mmol/L (ref 3.5–5.1)
Sodium: 133 mmol/L — ABNORMAL LOW (ref 135–145)

## 2018-08-13 LAB — CBC WITH DIFFERENTIAL/PLATELET
Abs Immature Granulocytes: 0.47 10*3/uL — ABNORMAL HIGH (ref 0.00–0.07)
Basophils Absolute: 0 10*3/uL (ref 0.0–0.1)
Basophils Relative: 0 %
Eosinophils Absolute: 0 10*3/uL (ref 0.0–0.5)
Eosinophils Relative: 0 %
HCT: 34.6 % — ABNORMAL LOW (ref 39.0–52.0)
Hemoglobin: 11.2 g/dL — ABNORMAL LOW (ref 13.0–17.0)
Immature Granulocytes: 2 %
Lymphocytes Relative: 3 %
Lymphs Abs: 0.7 10*3/uL (ref 0.7–4.0)
MCH: 29.2 pg (ref 26.0–34.0)
MCHC: 32.4 g/dL (ref 30.0–36.0)
MCV: 90.3 fL (ref 80.0–100.0)
Monocytes Absolute: 1 10*3/uL (ref 0.1–1.0)
Monocytes Relative: 4 %
Neutro Abs: 20.2 10*3/uL — ABNORMAL HIGH (ref 1.7–7.7)
Neutrophils Relative %: 91 %
Platelets: 410 10*3/uL — ABNORMAL HIGH (ref 150–400)
RBC: 3.83 MIL/uL — ABNORMAL LOW (ref 4.22–5.81)
RDW: 16.1 % — ABNORMAL HIGH (ref 11.5–15.5)
WBC: 22.4 10*3/uL — ABNORMAL HIGH (ref 4.0–10.5)
nRBC: 0 % (ref 0.0–0.2)

## 2018-08-13 MED ORDER — PREDNISONE 20 MG PO TABS
50.0000 mg | ORAL_TABLET | Freq: Every day | ORAL | Status: DC
  Administered 2018-08-14 – 2018-08-15 (×2): 50 mg via ORAL
  Filled 2018-08-13 (×2): qty 2

## 2018-08-13 MED ORDER — TORSEMIDE 20 MG PO TABS
20.0000 mg | ORAL_TABLET | Freq: Every day | ORAL | Status: DC
  Administered 2018-08-14: 20 mg via ORAL
  Filled 2018-08-13 (×2): qty 1

## 2018-08-13 NOTE — Progress Notes (Signed)
PROGRESS NOTE    Joel Valdez  TDD:[id number] DOB: 09-30-1945 DOA: 07/30/2018 PCP: [name], PA-C    Brief Narrative:  Patient 73 year old gentleman with multiple comorbidities and immunosuppression that includes prostate cancer, rheumatoid arthritis, on chronic prednisone therapy and methotrexate injection therapy, GERD, hypertension and depression. He has h/o tuberculosis, Asbestosis. Patient was admitted with respiratory failure with bilateral infiltrate.  He was initially intubated and then extubated on 08/03/2018.  He also developed a small pneumothorax that was treated with chest tube which is discontinued now.  COVID 19 x 2 negative.  CT scan of the chest showed extensive fibrotic changes and consolidations, no PE. Patient initially required 15 L of oxygen, now down to 5 L with some fluctuation in levels.    Assessment & Plan:   Principal Problem:   Pneumonia due to COVID-19 virus Active Problems:   Glaucoma   Essential hypertension, benign   Prostate cancer ([place])   Depression   Rheumatoid arthritis involving both hands ([place])   Essential hypertension  Acute hypoxic respiratory failure: Secondary to multifocal pneumonia.  Intubation 4/8//2020, extubated 08/03/2018.  Still remains on high flow oxygen.  Continue aggressive bronchodilator therapy, chest physiotherapy and PT/ OT. COVID-19 test x2 was negative CTA shows extensive scarring, bronchiectasis and acute on chronic changes probably has underlying interstitial lung disease.  He finished 10 days of IV antibiotic therapy. Patient was treated with aggressive diuresis therapy and high-dose steroids.  With some clinical improvement, will change to oral prednisone with prolonged taper to his baseline prednisone 10 mg daily.  Anticipate at least 2 weeks of taper.   Change IV Lasix to oral torsemide 20 mg daily.   Chest physiotherapy with flutter valve.  Continue bronchodilator therapy. He was treated with Plaquenil.   Continue with zinc and vitamin C.  Suspected COVID-19 infection: Ruled out. Patient has alternate explanation of hypoxia.  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  Very good response to IV steroids.  His oxygen requirement has somewhat improved. COVID-19 test was negative x2. Initially kept on airborne isolation because of high index of suspicion, given presence of alternative explanation, I discussed case with infectious disease.  Isolation precautions discontinued.  Left lung pneumothorax: Chest tube was removed on 08/04/2018.  Repeat x-ray was with improvement.  CT scan with no evidence of pneumothorax.  Rheumatoid arthritis: On chronic prednisone therapy that has been continued and increased to high-dose Solu-Medrol.  Will hold methotrexate in the setting of acute infection. Taper off Solu-Medrol today and change to oral prednisone therapy.  Hypertension: Fairly stable.  Hypokalemia:  Will keep on a schedule replacement when he is diuretics.  Mag and potassium are adequate today.  DVT prophylaxis: Lovenox subcu Code Status: Partial Family Communication: Patient's wife on the phone. Disposition Plan: Home when oxygen requirement improves less than 4 L.  Patient is still requiring high flow oxygen.  Keep on stepdown unit.  Anticipate discharge home when his oxygen requirement improves.  Will anticipate at least requiring less than 4 L of oxygen. Patient will need pulmonary referral on discharge. Discontinue Foley catheter.  Ambulate.   Consultants:   PCCM, signed off.  Procedures:   Intubation 4/8-4/10  Left IJ central line 4/8  Chest tube left, 4/8-4/11  Antimicrobials:   Azithromycin 500 mg IV, 07/31/2018/- 08/10/2018  Ceftriaxone 1 g every 24 hours, 07/31/2018- 08/10/2018   Subjective: Patient was seen and examined.  Remains on 5-6 L of oxygen.  Afebrile.  He feels tired.  He  had an episode of hypoxia on ambulation and needed nonrebreather.  Currently  on 6 L of oxygen.   Objective: Vitals:   08/13/18 0700 08/13/18 0800 08/13/18 0900 08/13/18 1000  BP: 121/85 125/66 132/66 116/65  Pulse: 61 65 (!) 59 (!) 59  Resp: 20 (!) 33 (!) 28 (!) 22  Temp:  98.3 F (36.8 C)    TempSrc:  Oral    SpO2: 99% 96% 93% 95%  Weight:      Height:        Intake/Output Summary (Last 24 hours) at 08/13/2018 1129 Last data filed at 08/13/2018 0800 Gross per 24 hour  Intake 690 ml  Output 2000 ml  Net -1310 ml   Filed Weights   08/11/18 0241 08/12/18 0500 08/13/18 0500  Weight: 61.2 kg 60.6 kg 56.1 kg    Examination:  General exam: Appears calm and comfortable , he is not in any obvious distress.  Anxious. Respiratory system: Clear to auscultation. Respiratory effort normal.  He has poor air entry bilateral, however no additional sounds. Cardiovascular system: S1 & S2 heard, RRR. No JVD, murmurs, rubs, gallops or clicks. No pedal edema. Gastrointestinal system: Abdomen is nondistended, soft and nontender. No organomegaly or masses felt. Normal bowel sounds heard. Central nervous system: Alert and oriented. No focal neurological deficits. Extremities: Symmetric 5 x 5 power. Skin: No rashes, lesions or ulcers Psychiatry: Judgement and insight appear normal. Mood & affect appropriate.     Data Reviewed: I have personally reviewed following labs and imaging studies  CBC: Recent Labs  Lab 08/08/18 0421 08/09/18 0350 08/10/18 0500 08/12/18 0235 08/13/18 1055  WBC 13.8* 16.0* 10.7* 22.3* 22.4*  NEUTROABS  --   --  9.4* 20.3* 20.2*  HGB 10.4* 10.8* 11.0* 10.1* 11.2*  HCT 33.6* 34.0* 34.4* 32.1* 34.6*  MCV 92.3 92.4 91.5 90.4 90.3  PLT 348 351 395 392 729*   Basic Metabolic Panel: Recent Labs  Lab 08/08/18 0421 08/09/18 0350 08/10/18 0500 08/11/18 0236 08/12/18 0235 08/13/18 1055  NA 135 136 135 128* 132* 133*  K 3.2* 3.7 3.7 3.1* 3.9 3.5  CL 98 100 95* 90* 94* 96*  CO2 _0 GLUCOSE 94 95 150* 167* 152* 203*  BUN  18 24* 26* 37* 45* 43*  CREATININE 0.87 1.05 1.09 1.17 1.37* 1.16  CALCIUM 8.5* 8.7* 9.0 8.5* 8.6* 8.4*  MG 1.9  --   --   --  2.3  --    GFR: Estimated Creatinine Clearance: 45 mL/min (by C-G formula based on SCr of 1.16 mg/dL). Liver Function Tests: Recent Labs  Lab 08/07/18 0409 08/08/18 0421 08/09/18 0350 08/10/18 0500 08/11/18 0236  AST _1 ALT 32 32 33 35 37  ALKPHOS 55 51 56 54 49  BILITOT 0.6 0.4 0.5 0.5 0.6  PROT 6.5 6.0* 6.3* 6.9 6.4*  ALBUMIN 2.8* 2.6* 2.7* 3.1* 2.8*   No results for input(s): LIPASE, AMYLASE in the last 168 hours. No results for input(s): AMMONIA in the last 168 hours. Coagulation Profile: No results for input(s): INR, PROTIME in the last 168 hours. Cardiac Enzymes: No results for input(s): CKTOTAL, CKMB, CKMBINDEX, TROPONINI in the last 168 hours. BNP (last 3 results) No results for input(s): PROBNP in the last 8760 hours. HbA1C: No results for input(s): HGBA1C in the last 72 hours. CBG: No results for input(s): GLUCAP in the last 168 hours. Lipid Profile: No results for input(s): CHOL, HDL, LDLCALC, TRIG, CHOLHDL,  LDLDIRECT in the last 72 hours. Thyroid Function Tests: No results for input(s): TSH, T4TOTAL, FREET4, T3FREE, THYROIDAB in the last 72 hours. Anemia Panel: No results for input(s): VITAMINB12, FOLATE, FERRITIN, TIBC, IRON, RETICCTPCT in the last 72 hours. Sepsis Labs: Recent Labs  Lab 08/07/18 0409 08/08/18 0421  PROCALCITON <0.10 <0.10    Recent Results (from the past 240 hour(s))  SARS Coronavirus 2 [facility] order, Performed in [facility] hospital lab)     Status: None   Collection Time: 08/08/18  9:44 AM  Result Value Ref Range Status   SARS Coronavirus 2 NEGATIVE NEGATIVE Final    Comment: (NOTE) If result is NEGATIVE SARS-CoV-2 target nucleic acids are NOT DETECTED. The SARS-CoV-2 RNA is generally detectable in upper and lower  respiratory specimens during the acute phase of infection. The lowest    concentration of SARS-CoV-2 viral copies this assay can detect is 250  copies / mL. A negative result does not preclude SARS-CoV-2 infection  and should not be used as the sole basis for treatment or other  patient management decisions.  A negative result may occur with  improper specimen collection / handling, submission of specimen other  than nasopharyngeal swab, presence of viral mutation(s) within the  areas targeted by this assay, and inadequate number of viral copies  (<250 copies / mL). A negative result must be combined with clinical  observations, patient history, and epidemiological information. If result is POSITIVE SARS-CoV-2 target nucleic acids are DETECTED. The SARS-CoV-2 RNA is generally detectable in upper and lower  respiratory specimens dur ing the acute phase of infection.  Positive  results are indicative of active infection with SARS-CoV-2.  Clinical  correlation with patient history and other diagnostic information is  necessary to determine patient infection status.  Positive results do  not rule out bacterial infection or co-infection with other viruses. If result is PRESUMPTIVE POSTIVE SARS-CoV-2 nucleic acids MAY BE PRESENT.   A presumptive positive result was obtained on the submitted specimen  and confirmed on repeat testing.  While 2019 novel coronavirus  (SARS-CoV-2) nucleic acids may be present in the submitted sample  additional confirmatory testing may be necessary for epidemiological  and / or clinical management purposes  to differentiate between  SARS-CoV-2 and other Sarbecovirus currently known to infect humans.  If clinically indicated additional testing with an alternate test  methodology [phone]) is advised. The SARS-CoV-2 RNA is generally  detectable in upper and lower respiratory sp ecimens during the acute  phase of infection. The expected result is Negative. Fact Sheet for Patients:  StrictlyIdeas.no Fact Sheet  for Healthcare Providers: BankingDealers.co.za This test is not yet approved or cleared by the [place] FDA and has been authorized for detection and/or diagnosis of SARS-CoV-2 by FDA under an Emergency Use Authorization (EUA).  This EUA will remain in effect (meaning this test can be used) for the duration of the COVID-19 declaration under Section 564(b)(1) of the Act, 21 U.S.C. section 360bbb-3(b)(1), unless the authorization is terminated or revoked sooner. Performed at [facility], [place] [address], [place], [place] [address]          Radiology Studies: No results found.      Scheduled Meds:  atorvastatin  10 mg Per Tube QHS   chlorhexidine gluconate (MEDLINE KIT)  15 mL Mouth Rinse BID   Chlorhexidine Gluconate Cloth  6 each Topical Daily   enoxaparin (LOVENOX) injection  40 mg Subcutaneous Q24H   escitalopram  10 mg Oral Daily  feeding supplement (ENSURE ENLIVE)  237 mL Oral B22V   folic acid  1 mg Oral Daily   guaiFENesin  1,200 mg Oral BID   Ipratropium-Albuterol  2 puff Inhalation TID   latanoprost  1 drop Both Eyes QHS   mouth rinse  15 mL Mouth Rinse BID   mometasone-formoterol  2 puff Inhalation BID   multivitamin with minerals  1 tablet Oral Daily   pantoprazole  40 mg Oral Daily   potassium chloride  40 mEq Oral BID   [START ON 08/14/2018] predniSONE  50 mg Oral Q breakfast   sodium chloride flush  10-40 mL Intracatheter Q12H   timolol  1 drop Both Eyes Daily   [START ON 08/14/2018] torsemide  20 mg Oral Daily   vitamin C  500 mg Oral BID   zinc sulfate  220 mg Oral Daily   Continuous Infusions:  sodium chloride 50 mL (08/09/18 0805)     LOS: 14 days    Time spent: 25 minutes    Barb Merino, MD [facility]  Pager [phone]  If 7PM-7AM, please contact night-coverage [email or web address] Password [facility] 08/13/2018, 11:29 AM

## 2018-08-13 NOTE — Progress Notes (Signed)
PT Cancellation Note  Patient Details Name: Joel Valdez. Sluder MRN: [id number] DOB: [birth year]   Cancelled Treatment:     attempted to see twice however pt only able to tolerate getting OOB to recliner/BSC with MAX 4/4 dyspnea, increased WOB and decreased sats.  Unable to tolerate further activity.  Will continue to follow as pt hoped to D/C to home.     Rica Koyanagi  PTA Acute  Rehabilitation Services Pager      [phone] Office      [phone]

## 2018-08-14 MED ORDER — AMLODIPINE BESYLATE 10 MG PO TABS
10.0000 mg | ORAL_TABLET | Freq: Every day | ORAL | Status: DC
  Administered 2018-08-14 – 2018-08-15 (×2): 10 mg via ORAL
  Filled 2018-08-14 (×2): qty 1

## 2018-08-14 NOTE — Progress Notes (Signed)
PROGRESS NOTE    Joel Valdez  NUU:[id number] DOB: 1945/12/05 DOA: 07/30/2018 PCP: [name], PA-C    Brief Narrative:  Patient 73 year old gentleman with multiple comorbidities and immunosuppression that includes prostate cancer, rheumatoid arthritis, on chronic prednisone therapy and methotrexate injection therapy, GERD, hypertension and depression. He has h/o tuberculosis, Asbestosis. Patient was admitted with respiratory failure with bilateral infiltrate.  He was initially intubated and then extubated on 08/03/2018.  He also developed a small pneumothorax that was treated with chest tube which is discontinued now.  COVID 19 x 2 negative.  CT scan of the chest showed extensive fibrotic changes and consolidations, no PE. Patient initially required 15 L of oxygen, now down to 5-6 L with some fluctuation in levels.    Assessment & Plan:   Principal Problem:   Pneumonia due to COVID-19 virus Active Problems:   Glaucoma   Essential hypertension, benign   Prostate cancer ([place])   Depression   Rheumatoid arthritis involving both hands ([place])   Essential hypertension  Acute hypoxic respiratory failure: Secondary to multifocal pneumonia.  Intubation 4/8//2020, extubated 08/03/2018.  Still remains on high flow oxygen.  Continue aggressive bronchodilator therapy, chest physiotherapy and PT/ OT. COVID-19 test x2 was negative CTA shows extensive scarring, bronchiectasis and acute on chronic changes probably has underlying interstitial lung disease.  He finished 10 days of IV antibiotic therapy. Patient was treated with aggressive diuresis therapy and high-dose steroids. With some clinical improvement, will change to oral prednisone with prolonged taper to his baseline prednisone 10 mg daily.  Anticipate at least 2 weeks of taper.   Changing IV Lasix to oral torsemide 20 mg daily.   Chest physiotherapy with flutter valve.  Continue bronchodilator therapy. He was treated with Plaquenil.   Continue with zinc and vitamin C.  Suspected COVID-19 infection: Ruled out. Patient has alternate explanation of hypoxia.  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  Very good response to IV steroids.  His oxygen requirement has somewhat improved. COVID-19 test was negative x2. Initially kept on airborne isolation because of high index of suspicion, given presence of alternative explanation, I discussed case with infectious disease.  Isolation precautions discontinued.  Left lung pneumothorax: Chest tube was removed on 08/04/2018.  Repeat x-ray was with improvement.  CT scan with no evidence of pneumothorax.  Rheumatoid arthritis: On chronic prednisone therapy that has been continued and increased to high-dose Solu-Medrol.  Will hold methotrexate in the setting of acute infection. change to oral prednisone therapy.  Hypertension: Now with increased blood pressures.  Will resume amlodipine from home.  Hypokalemia:  Will keep on a schedule replacement when he is diuretics.  Mag and potassium are adequate today. Recheck CBC and BMP tomorrow morning.  DVT prophylaxis: Lovenox subcu Code Status: Partial Family Communication: Patient's wife on the phone. Disposition Plan: Home when oxygen requirement improves less than 4 L.  Patient is still requiring 5 to 6 L of oxygen with occasional hypoxia on ambulation.  He was not using oxygen at home. I discussed and updated patient's wife that we will try to wean him off and see if he can go home with less than 4 L of oxygen.  Next few days, if he still requires more than 4 L of oxygen he will go home with high level of oxygen. Please refer to pulmonology on discharge.  Consultants:   PCCM, signed off.  Procedures:   Intubation 4/8-4/10  Left IJ central line 4/8  Chest tube left,  4/8-4/11  Antimicrobials:   Azithromycin 500 mg IV, 07/31/2018/- 08/10/2018  Ceftriaxone 1 g every 24 hours, 07/31/2018- 08/10/2018   Subjective:  Patient was seen and examined.  Remains on 5-6 L of oxygen.  Afebrile.  No problem today.  Hypoxic on coughing and talking. Occasional need for more oxygen on movement.  Afebrile. He has leukocytosis, which is likely from high-dose steroids.  Will recheck tomorrow morning.   Objective: Vitals:   08/14/18 0600 08/14/18 0700 08/14/18 0800 08/14/18 0900  BP: 117/85 135/67 119/77 (!) 180/90  Pulse: (!) 57 (!) 58 (!) 59 (!) 57  Resp: (!) 23 18 (!) 28 (!) 23  Temp:   97.7 F (36.5 C)   TempSrc:   Oral   SpO2: 93% 96% (!) 87% 96%  Weight:      Height:        Intake/Output Summary (Last 24 hours) at 08/14/2018 1013 Last data filed at 08/14/2018 0826 Gross per 24 hour  Intake 10 ml  Output 2025 ml  Net -2015 ml   Filed Weights   08/11/18 0241 08/12/18 0500 08/13/18 0500  Weight: 61.2 kg 60.6 kg 56.1 kg    Examination:  General exam: Appears calm and comfortable , he is not in any obvious distress.  Anxious. Respiratory system: Clear to auscultation. Respiratory effort normal.  He has poor air entry bilateral, however no additional sounds. Cardiovascular system: S1 & S2 heard, RRR. No JVD, murmurs, rubs, gallops or clicks. No pedal edema. Gastrointestinal system: Abdomen is nondistended, soft and nontender. No organomegaly or masses felt. Normal bowel sounds heard. Central nervous system: Alert and oriented. No focal neurological deficits. Extremities: Symmetric 5 x 5 power. Skin: No rashes, lesions or ulcers Psychiatry: Judgement and insight appear normal. Mood & affect appropriate.     Data Reviewed: I have personally reviewed following labs and imaging studies  CBC: Recent Labs  Lab 08/08/18 0421 08/09/18 0350 08/10/18 0500 08/12/18 0235 08/13/18 1055  WBC 13.8* 16.0* 10.7* 22.3* 22.4*  NEUTROABS  --   --  9.4* 20.3* 20.2*  HGB 10.4* 10.8* 11.0* 10.1* 11.2*  HCT 33.6* 34.0* 34.4* 32.1* 34.6*  MCV 92.3 92.4 91.5 90.4 90.3  PLT 348 351 395 392 410*   Basic  Metabolic Panel: Recent Labs  Lab 08/08/18 0421 08/09/18 0350 08/10/18 0500 08/11/18 0236 08/12/18 0235 08/13/18 1055  NA 135 136 135 128* 132* 133*  K 3.2* 3.7 3.7 3.1* 3.9 3.5  CL 98 100 95* 90* 94* 96*  CO2 '29 28 29 27 25 26  ' GLUCOSE 94 95 150* 167* 152* 203*  BUN 18 24* 26* 37* 45* 43*  CREATININE 0.87 1.05 1.09 1.17 1.37* 1.16  CALCIUM 8.5* 8.7* 9.0 8.5* 8.6* 8.4*  MG 1.9  --   --   --  2.3  --    GFR: Estimated Creatinine Clearance: 45 mL/min (by C-G formula based on SCr of 1.16 mg/dL). Liver Function Tests: Recent Labs  Lab 08/08/18 0421 08/09/18 0350 08/10/18 0500 08/11/18 0236  AST '19 19 19 25  ' ALT 32 33 35 37  ALKPHOS 51 56 54 49  BILITOT 0.4 0.5 0.5 0.6  PROT 6.0* 6.3* 6.9 6.4*  ALBUMIN 2.6* 2.7* 3.1* 2.8*   No results for input(s): LIPASE, AMYLASE in the last 168 hours. No results for input(s): AMMONIA in the last 168 hours. Coagulation Profile: No results for input(s): INR, PROTIME in the last 168 hours. Cardiac Enzymes: No results for input(s): CKTOTAL, CKMB, CKMBINDEX, TROPONINI in the last  168 hours. BNP (last 3 results) No results for input(s): PROBNP in the last 8760 hours. HbA1C: No results for input(s): HGBA1C in the last 72 hours. CBG: No results for input(s): GLUCAP in the last 168 hours. Lipid Profile: No results for input(s): CHOL, HDL, LDLCALC, TRIG, CHOLHDL, LDLDIRECT in the last 72 hours. Thyroid Function Tests: No results for input(s): TSH, T4TOTAL, FREET4, T3FREE, THYROIDAB in the last 72 hours. Anemia Panel: No results for input(s): VITAMINB12, FOLATE, FERRITIN, TIBC, IRON, RETICCTPCT in the last 72 hours. Sepsis Labs: Recent Labs  Lab 08/08/18 0421  PROCALCITON <0.10    Recent Results (from the past 240 hour(s))  SARS Coronavirus 2 [facility] order, Performed in [facility] hospital lab)     Status: None   Collection Time: 08/08/18  9:44 AM  Result Value Ref Range Status   SARS Coronavirus 2 NEGATIVE NEGATIVE Final     Comment: (NOTE) If result is NEGATIVE SARS-CoV-2 target nucleic acids are NOT DETECTED. The SARS-CoV-2 RNA is generally detectable in upper and lower  respiratory specimens during the acute phase of infection. The lowest  concentration of SARS-CoV-2 viral copies this assay can detect is 250  copies / mL. A negative result does not preclude SARS-CoV-2 infection  and should not be used as the sole basis for treatment or other  patient management decisions.  A negative result may occur with  improper specimen collection / handling, submission of specimen other  than nasopharyngeal swab, presence of viral mutation(s) within the  areas targeted by this assay, and inadequate number of viral copies  (<250 copies / mL). A negative result must be combined with clinical  observations, patient history, and epidemiological information. If result is POSITIVE SARS-CoV-2 target nucleic acids are DETECTED. The SARS-CoV-2 RNA is generally detectable in upper and lower  respiratory specimens dur ing the acute phase of infection.  Positive  results are indicative of active infection with SARS-CoV-2.  Clinical  correlation with patient history and other diagnostic information is  necessary to determine patient infection status.  Positive results do  not rule out bacterial infection or co-infection with other viruses. If result is PRESUMPTIVE POSTIVE SARS-CoV-2 nucleic acids MAY BE PRESENT.   A presumptive positive result was obtained on the submitted specimen  and confirmed on repeat testing.  While 2019 novel coronavirus  (SARS-CoV-2) nucleic acids may be present in the submitted sample  additional confirmatory testing may be necessary for epidemiological  and / or clinical management purposes  to differentiate between  SARS-CoV-2 and other Sarbecovirus currently known to infect humans.  If clinically indicated additional testing with an alternate test  methodology [phone]) is advised. The SARS-CoV-2  RNA is generally  detectable in upper and lower respiratory sp ecimens during the acute  phase of infection. The expected result is Negative. Fact Sheet for Patients:  StrictlyIdeas.no Fact Sheet for Healthcare Providers: BankingDealers.co.za This test is not yet approved or cleared by the [place] FDA and has been authorized for detection and/or diagnosis of SARS-CoV-2 by FDA under an Emergency Use Authorization (EUA).  This EUA will remain in effect (meaning this test can be used) for the duration of the COVID-19 declaration under Section 564(b)(1) of the Act, 21 U.S.C. section 360bbb-3(b)(1), unless the authorization is terminated or revoked sooner. Performed at [facility], [place] [address], [place], [place] [address]          Radiology Studies: No results found.      Scheduled Meds: . atorvastatin  10  mg Per Tube QHS  . chlorhexidine gluconate (MEDLINE KIT)  15 mL Mouth Rinse BID  . Chlorhexidine Gluconate Cloth  6 each Topical Daily  . enoxaparin (LOVENOX) injection  40 mg Subcutaneous Q24H  . escitalopram  10 mg Oral Daily  . feeding supplement (ENSURE ENLIVE)  237 mL Oral Q24H  . folic acid  1 mg Oral Daily  . guaiFENesin  1,200 mg Oral BID  . Ipratropium-Albuterol  2 puff Inhalation TID  . latanoprost  1 drop Both Eyes QHS  . mouth rinse  15 mL Mouth Rinse BID  . mometasone-formoterol  2 puff Inhalation BID  . multivitamin with minerals  1 tablet Oral Daily  . pantoprazole  40 mg Oral Daily  . potassium chloride  40 mEq Oral BID  . predniSONE  50 mg Oral Q breakfast  . timolol  1 drop Both Eyes Daily  . torsemide  20 mg Oral Daily  . vitamin C  500 mg Oral BID  . zinc sulfate  220 mg Oral Daily   Continuous Infusions:    LOS: 15 days    Time spent: 25 minutes    Barb Merino, MD [facility]  Pager [phone]  If 7PM-7AM, please contact night-coverage [email or web address] Password  [facility] 08/14/2018, 10:13 AM

## 2018-08-14 NOTE — Progress Notes (Signed)
Nutrition Follow-up  RD working remotely.   DOCUMENTATION CODES:   Not applicable  INTERVENTION:  - continue Ensure Enlive once/day. - continue to encourage PO intakes.    NUTRITION DIAGNOSIS:   Increased nutrient needs related to acute illness, chronic illness as evidenced by estimated needs. -ongoing  GOAL:   Patient will meet greater than or equal to 90% of their needs -met on average  MONITOR:   PO intake, Supplement acceptance, Labs, Weight trends, Skin  ASSESSMENT:   73 y.o. male with a past medical history significant for anxiety/depression, asthma, GERD, HTN, hyperlipidemia, history of prostate cancer (currently receiving Lupron) and rheumatoid arthritis (chronically on a steroids and methotrexate). He presented to the ED due to general malaise, chills, SOB, and cough x1 week. Patient denied fever, chest pain, N/V, hemoptysis, melena, hematochezia, or focal weakness. He reported that he had been around a friend who was sick ~1 week ago. Due to ongoing symptoms he was evaluated through telemedicine by PCP and advised to go to the ED for COVID-19 rule out and further evaluation and management. In the ED, CXR demonstrated multifocal pneumonia with perihilar infiltrate and he had a temperature of 99.5 degrees. Influenza follow-up PCR was negative.  Weight has fluctuated since last RD assessment on 4/14. Estimated nutrition needs remain appropriate. Per review of RN flow sheet, patient has been eating 50-100% of meals since 4/15. Ensure Enlive was ordered once/day by this RD on 4/14 and patient has accepted 6 of 8 bottles since that date.  Per Dr. 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   Medications reviewed; 1 mg oral  folvite/day, daily multivitamin with minerals, 40 mEq K-Dur BID, 50 mg deltasone/day, 500 mg ascorbic acid BID, 220 mg oral zinc sulfate/day.  Labs reviewed; Na: 133 mmol/l, Cl: 96 mmol/l, BUN: 43 mg/dl, Ca: 8.4 mg/dl.      NUTRITION - FOCUSED PHYSICAL EXAM:  unable to complete at this time.  Diet Order:   Diet Order            DIET SOFT Room service appropriate? Yes; Fluid consistency: Thin  Diet effective now              EDUCATION NEEDS:   Not appropriate for education at this time  Skin:  Skin Assessment: Skin Integrity Issues: Skin Integrity Issues:: Incisions Incisions: L flank (4/11)  Last BM:  4/20  Height:   Ht Readings from Last 1 Encounters:  08/01/18 '5\' 6"'$  (1.676 m)    Weight:   Wt Readings from Last 1 Encounters:  08/13/18 56.1 kg    Ideal Body Weight:  64.54 kg  BMI:  Body mass index is 19.96 kg/m.  Estimated Nutritional Needs:   Kcal:  2015-2215 kcal  Protein:  85-100 grams  Fluid:  >/= 1.8 L/day     Jarome Matin, MS, RD, LDN, [facility] Inpatient Clinical Dietitian Pager # [phone] After hours/weekend pager # [phone]

## 2018-08-14 NOTE — Progress Notes (Signed)
Occupational Therapy Treatment Patient Details Name: Joel Valdez. Duddy MRN: [id number] DOB: 1945-11-30 Today's Date: 08/14/2018    History of present illness Pt admitted 2* SOB with acute respiratory failure and intubated 07/31/08 and extubated 08/02/08.  Pt  with hx of RA, asthma, anxiety/depression, and deafness R ear      Follow Up Recommendations  Supervision/Assistance - 24 hour;Home health OT;SNF;CIR(depending on progress)    Equipment Recommendations  3 in 1 bedside commode    Recommendations for Other Services      Precautions / Restrictions Precautions Precautions: Fall Precaution Comments: monitor sats, needs NRB to keeps sats up during mobility.       Mobility Bed Mobility   Bed Mobility: Supine to Sit     Supine to sit: Modified independent (Device/Increase time)        Transfers Overall transfer level: Needs assistance Equipment used: Rolling walker (2 wheeled) Transfers: Sit to/from Stand Sit to Stand: Min guard Stand pivot transfers: Min guard       General transfer comment: pt needed MAX encouragement to get to chair.  Pt motivated by fresh coffee and choc ice cream    Balance Overall balance assessment: Needs assistance Sitting-balance support: No upper extremity supported;Feet supported Sitting balance-Leahy Scale: Good                                     ADL either performed or assessed with clinical judgement   ADL Overall ADL's : Needs assistance/impaired Eating/Feeding: Independent Eating/Feeding Details (indicate cue type and reason): coffee and choc ice cream Grooming: Wash/dry face;Brushing hair;Standing;Sitting                                 General ADL Comments: pt sats decreased to 78, 84 then back to 94 with increased time, taking rest breaks and deep breathing.  RN present for OT session     Vision Patient Visual Report: No change from baseline            Cognition Arousal/Alertness:  Awake/alert Behavior During Therapy: WFL for tasks assessed/performed Overall Cognitive Status: Within Functional Limits for tasks assessed                                                     Pertinent Vitals/ Pain       Pain Assessment: No/denies pain     Prior Functioning/Environment              Frequency  Min 2X/week        Progress Toward Goals  OT Goals(current goals can now be found in the care plan section)  Progress towards OT goals: Progressing toward goals     Plan Discharge plan remains appropriate       AM-PAC OT "6 Clicks" Daily Activity     Outcome Measure   Help from another person eating meals?: None Help from another person taking care of personal grooming?: A Little Help from another person toileting, which includes using toliet, bedpan, or urinal?: A Lot Help from another person bathing (including washing, rinsing, drying)?: A Lot Help from another person to put on and taking off regular upper body clothing?: A Lot Help from another person to  put on and taking off regular lower body clothing?: A Lot 6 Click Score: 15    End of Session    OT Visit Diagnosis: Unsteadiness on feet (R26.81);Muscle weakness (generalized) (M62.81)   Activity Tolerance Patient limited by fatigue   Patient Left in bed;with call bell/phone within reach   Nurse Communication          Time: 0981-1914 OT Time Calculation (min): 15 min  Charges: OT General Charges $OT Visit: 1 Visit OT Treatments $Self Care/Home Management : 8-22 mins  Kari Baars, [place] [phone] Office- [phone], Edwena Felty D 08/14/2018, 4:54 PM

## 2018-08-14 NOTE — Progress Notes (Signed)
Physical Therapy Treatment Patient Details Name: Joel Valdez. Kohles MRN: [id number] DOB: 1945-06-28 Today's Date: 08/14/2018    History of Present Illness Pt admitted 2* SOB with acute respiratory failure and intubated 07/31/08 and extubated 08/02/08.  Pt  with hx of RA, asthma, anxiety/depression, and deafness R ear    PT Comments    Patient's  Oxygen saturation dropped to 89% on ^ L. HFNC. Placed on NRB mask with sats maintaining at 98%. RN aware. Continue efforts for mobility. Patient really wants to go home. Will attempt ambulation out of room as tolerated .  Follow Up Recommendations  Home health PT;SNF(patient wants to go home)     Equipment Recommendations  Other (comment)    Recommendations for Other Services       Precautions / Restrictions Precautions Precautions: Fall Precaution Comments: monitor sats, needs NRB to keeps sats up during mobility.    Mobility  Bed Mobility   Bed Mobility: Supine to Sit     Supine to sit: Modified independent (Device/Increase time)        Transfers Overall transfer level: Needs assistance Equipment used: Rolling walker (2 wheeled) Transfers: Sit to/from Stand Sit to Stand: Min guard Stand pivot transfers: Min guard       General transfer comment: patient able to to stand and  pivot to recliner after marching in place.  Ambulation/Gait Ambulation/Gait assistance: Min guard Gait Distance (Feet): 8 Feet   Gait Pattern/deviations: Step-to pattern     General Gait Details: patient marched in placed x 10 on 6 L. HFNC, sats dropped to 79%. Placed on NRB, stas back up to 98%. Able to ambulate a short distance on NRB.   Stairs             Wheelchair Mobility    Modified Rankin (Stroke Patients Only)       Balance                                            Cognition Arousal/Alertness: Awake/alert Behavior During Therapy: WFL for tasks assessed/performed                                           Exercises      General Comments        Pertinent Vitals/Pain Pain Assessment: No/denies pain    Home Living                      Prior Function            PT Goals (current goals can now be found in the care plan section) Progress towards PT goals: Progressing toward goals    Frequency    Min 3X/week      PT Plan Current plan remains appropriate    Co-evaluation              AM-PAC PT "6 Clicks" Mobility   Outcome Measure  Help needed turning from your back to your side while in a flat bed without using bedrails?: None Help needed moving from lying on your back to sitting on the side of a flat bed without using bedrails?: None Help needed moving to and from a bed to a chair (including a wheelchair)?: A Little Help needed standing up from  a chair using your arms (e.g., wheelchair or bedside chair)?: A Little Help needed to walk in hospital room?: A Little Help needed climbing 3-5 steps with a railing? : A Lot 6 Click Score: 19    End of Session Equipment Utilized During Treatment: Oxygen;Gait belt Activity Tolerance: Treatment limited secondary to medical complications (Comment) Patient left: in chair;with call bell/phone within reach Nurse Communication: Mobility status PT Visit Diagnosis: Unsteadiness on feet (R26.81)     Time: 3545-6256 PT Time Calculation (min) (ACUTE ONLY): 19 min  Charges:  $Gait Training: 8-22 mins                     Tresa Endo PT Acute Rehabilitation Services Pager [phone] Office [phone]    Claretha Cooper 08/14/2018, 11:41 AM

## 2018-08-14 NOTE — Plan of Care (Signed)
Pt stable at this time with no needs. Continuing pt current plan of care. Pt 02 sats and overall respiratory status remains stable on 5lnc. Rn will attempt to wean if possible throughout shift.

## 2018-08-15 DIAGNOSIS — Z23 Encounter for immunization: Secondary | ICD-10-CM | POA: Diagnosis not present

## 2018-08-15 LAB — CBC WITH DIFFERENTIAL/PLATELET
Abs Immature Granulocytes: 0.8 10*3/uL — ABNORMAL HIGH (ref 0.00–0.07)
Basophils Absolute: 0.1 10*3/uL (ref 0.0–0.1)
Basophils Relative: 0 %
Eosinophils Absolute: 0 10*3/uL (ref 0.0–0.5)
Eosinophils Relative: 0 %
HCT: 34.8 % — ABNORMAL LOW (ref 39.0–52.0)
Hemoglobin: 10.9 g/dL — ABNORMAL LOW (ref 13.0–17.0)
Immature Granulocytes: 4 %
Lymphocytes Relative: 8 %
Lymphs Abs: 1.9 10*3/uL (ref 0.7–4.0)
MCH: 28.3 pg (ref 26.0–34.0)
MCHC: 31.3 g/dL (ref 30.0–36.0)
MCV: 90.4 fL (ref 80.0–100.0)
Monocytes Absolute: 1.7 10*3/uL — ABNORMAL HIGH (ref 0.1–1.0)
Monocytes Relative: 7 %
Neutro Abs: 18.4 10*3/uL — ABNORMAL HIGH (ref 1.7–7.7)
Neutrophils Relative %: 81 %
Platelets: 411 10*3/uL — ABNORMAL HIGH (ref 150–400)
RBC: 3.85 MIL/uL — ABNORMAL LOW (ref 4.22–5.81)
RDW: 15.9 % — ABNORMAL HIGH (ref 11.5–15.5)
WBC: 23 10*3/uL — ABNORMAL HIGH (ref 4.0–10.5)
nRBC: 0 % (ref 0.0–0.2)

## 2018-08-15 LAB — BASIC METABOLIC PANEL
Anion gap: 10 (ref 5–15)
BUN: 47 mg/dL — ABNORMAL HIGH (ref 8–23)
CO2: 24 mmol/L (ref 22–32)
Calcium: 8.3 mg/dL — ABNORMAL LOW (ref 8.9–10.3)
Chloride: 97 mmol/L — ABNORMAL LOW (ref 98–111)
Creatinine, Ser: 1.4 mg/dL — ABNORMAL HIGH (ref 0.61–1.24)
GFR calc Af Amer: 57 mL/min — ABNORMAL LOW (ref >60)
GFR calc non Af Amer: 49 mL/min — ABNORMAL LOW (ref >60)
Glucose, Bld: 97 mg/dL (ref 70–99)
Potassium: 4.6 mmol/L (ref 3.5–5.1)
Sodium: 131 mmol/L — ABNORMAL LOW (ref 135–145)

## 2018-08-15 MED ORDER — ADULT MULTIVITAMIN W/MINERALS CH: 1.0000 | ORAL_TABLET | Freq: Every day | ORAL | 0 refills | Status: DC

## 2018-08-15 MED ORDER — PREDNISONE 10 MG PO TABS: ORAL_TABLET | ORAL | 0 refills | Status: DC

## 2018-08-15 MED ORDER — GUAIFENESIN ER 600 MG PO TB12: 1200.0000 mg | ORAL_TABLET | Freq: Two times a day (BID) | ORAL | 0 refills | Status: DC | PRN

## 2018-08-15 MED ORDER — FUROSEMIDE 20 MG PO TABS: 20.0000 mg | ORAL_TABLET | Freq: Every day | ORAL | 0 refills | Status: DC

## 2018-08-15 MED ORDER — AMLODIPINE BESYLATE 10 MG PO TABS: 10.0000 mg | ORAL_TABLET | Freq: Every day | ORAL | 0 refills | Status: DC

## 2018-08-15 MED ORDER — PREDNISONE 5 MG PO TABS: 5.0000 mg | ORAL_TABLET | Freq: Every day | ORAL | Status: DC

## 2018-08-15 MED ORDER — ALBUTEROL SULFATE HFA 108 (90 BASE) MCG/ACT IN AERS: 2.0000 | INHALATION_SPRAY | Freq: Four times a day (QID) | RESPIRATORY_TRACT | 0 refills | Status: DC | PRN

## 2018-08-15 NOTE — TOC Initial Note (Signed)
Transition of Care (TOC) - Initial/Assessment Note    Patient Details  Name: Joel Valdez. Pelle[place]chia MRN: [id number] Date of Birth: [birth year]  Transition of Care [facility]) CM/SW Contact:    Jakoby Melendrez, Marjie Skiff, RN Phone Number: 08/15/2018, 3:06 PM  Clinical Narrative:                 Choice offered to pt for home health services. [facility] chosen and [place] rep alerted of referral. Home 02 needed and [facility] alerted of need for home 02. Per pt, sister is to come pick him up for transport home.  Expected Discharge Plan: [place] Barriers to Discharge: No Barriers Identified   Patient Goals and CMS Choice Patient states their goals for this hospitalization and ongoing recovery are:: to get better      Expected Discharge Plan and Services Expected Discharge Plan: [place] Acute Care Choice: [place] arrangements for the past 2 months: Single Family Home Expected Discharge Date: 08/15/18               DME Arranged: Oxygen   HH Arranged: RN, PT HH Agency: [place]  Prior Living Arrangements/Services Living arrangements for the past 2 months: Single Family Home Lives with:: Significant Other Patient language and need for interpreter reviewed:: Yes Do you feel safe going back to the place where you live?: Yes      Need for Family Participation in Patient Care: No (Comment)     Criminal Activity/Legal Involvement Pertinent to Current Situation/Hospitalization: No - Comment as needed  Activities of Daily Living Home Assistive Devices/Equipment: Hearing aid, Eyeglasses ADL Screening (condition at time of admission) Patient's cognitive ability adequate to safely complete daily activities?: Yes Is the patient deaf or have difficulty hearing?: Yes Does the patient have difficulty seeing, even when wearing glasses/contacts?: Yes Does the patient have difficulty concentrating, remembering, or making decisions?: No Patient  able to express need for assistance with ADLs?: Yes Does the patient have difficulty dressing or bathing?: No Independently performs ADLs?: Yes (appropriate for developmental age) Does the patient have difficulty walking or climbing stairs?: No Weakness of Legs: Both Weakness of Arms/Hands: Both  Permission Sought/Granted Permission sought to share information with : Chartered certified accountant granted to share information with : Yes, Verbal Permission Granted     Permission granted to share info w AGENCY: [facility]        Emotional Assessment              Admission diagnosis:  Shortness of breath [R06.02] Hypoxia [R09.02] Multifocal pneumonia [J18.9] Community acquired pneumonia, unspecified laterality [J18.9] Patient Active Problem List   Diagnosis Date Noted  . Pneumonia due to COVID-19 virus 07/30/2018  . Memory loss 03/30/2018  . Hyperlipidemia 04/17/2016  . Pelvic lymphadenopathy   . Essential hypertension 02/25/2015  . Rheumatoid arthritis involving both hands ([place]) 01/26/2015  . Depression 11/21/2014  . Prostate cancer ([place]) 04/28/2014  . Bruit 11/13/2013  . Abnormal electrocardiogram 11/13/2013  . Glaucoma 09/27/2013  . Essential hypertension, benign 09/27/2013  . Nocturia 09/27/2013  . Colon cancer screening 09/27/2013   PCP:  [name], PA-C Pharmacy:   [place], [place] - [address] [place] [address] Phone: [phone] Fax: [phone]     Social Determinants of Health (SDOH) Interventions    Readmission Risk Interventions No flowsheet data found.

## 2018-08-15 NOTE — Progress Notes (Signed)
Patient at rest on 4lpm of oxygen via [place] O2 sats are 94%.  Patient up ambulating or with any exertion requires 6lpm of oxygen via [place] to keep O2 sats above 88%.

## 2018-08-15 NOTE — Progress Notes (Signed)
SATURATION QUALIFICATIONS: (This note is used to comply with regulatory documentation for home oxygen)  Patient Saturations on Room Air at Rest = 82%   Patient Saturations on Room Air while Ambulating =   Patient Saturations on  Liters of oxygen while Ambulating =   Please briefly explain why patient needs home oxygen:  Patient placed on 4lpm of oxygen via [place] to maintain O2 sats> 88% while at rest

## 2018-08-15 NOTE — Discharge Summary (Addendum)
Physician Discharge Summary  Joel Valdez. Nedved VHQ:[id number] DOB: 1945-07-22 DOA: 07/30/2018  PCP: [name], PA-C  Admit date: 07/30/2018 Discharge date: 08/15/2018  Admitted From: Home Disposition:  Home  Discharge Condition:Stable CODE STATUS:FULL Diet recommendation: Heart Healthy  Brief/Interim Summary: Patient 73 year old gentleman with multiple comorbidities and immunosuppression that includes prostate cancer, rheumatoid arthritis, on chronic prednisone therapy and methotrexate injection therapy, GERD, hypertension and depression. He has h/o tuberculosis, Asbestosis. Patient was admitted with respiratory failure with bilateral infiltrate.  He was initially intubated and then extubated on 08/03/2018.  He also developed a small pneumothorax that was treated with chest tube which is discontinued now.  COVID 19 x 2 negative.  CT scan of the chest showed extensive fibrotic changes and consolidations, no PE. Patient initially required 15 L of oxygen, now down to 2 L .  Patient evaluated by physical therapy and recommended home health on discharge.  Patient is hemodynamically stable for discharge today to home.   Following problems were addressed during his hospitalization:   Acute hypoxic respiratory failure: Secondary to multifocal pneumonia.  Intubation 4/8//2020, extubated 08/03/2018.  Still remains on high flow oxygen.  Continue aggressive bronchodilator therapy, chest physiotherapy and PT/ OT. COVID-19 test x2 was negative CTA shows extensive scarring, bronchiectasis and acute on chronic changes probably has underlying interstitial lung disease.  He finished 10 days of IV antibiotic therapy. Patient was treated with aggressive diuresis therapy and high-dose steroids. With some clinical improvement, will change to oral prednisone with prolonged taper to his baseline prednisone .Changing IV Lasix to oral 20 mg daily.   He was treated with Plaquenil.  Patient will follow-up with  pulmonology as an outpatient. He might have developed chronic respiratory insufficiency secondary to pulmonary fibrosis secondary to his history of rheumatoid arthritis. He qualified for home oxygen.  Suspected COVID-19 infection: Ruled out. Patient has alternate explanation of hypoxia.  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  Very good response to IV steroids.  His oxygen requirement has somewhat improved. COVID-19 test was negative x2. Initially kept on airborne isolation because of high index of suspicion, given presence of alternative explanation, we discussed case with infectious disease.  Isolation precautions discontinued.  Left lung pneumothorax: Chest tube was removed on 08/04/2018.  Repeat x-ray was with improvement.  CT scan with no evidence of pneumothorax.  Rheumatoid arthritis: On chronic prednisone therapy that had been continued and increased to high-dose Solu-Medrol. Change to oral prednisone therapy.  Hypertension:  Will resume amlodipine from home.   Discharge Diagnoses:  Principal Problem:   Pneumonia due to COVID-19 virus Active Problems:   Glaucoma   Essential hypertension, benign   Prostate cancer ([place])   Depression   Rheumatoid arthritis involving both hands [facility])   Essential hypertension    Discharge Instructions  Discharge Instructions    Ambulatory referral to Pulmonology   Complete by:  As directed    Diet - low sodium heart healthy   Complete by:  As directed    Discharge instructions   Complete by:  As directed    1)Please follow-up with your PCP in a week.  Do a CBC, BMP test during the follow-up 2)Take prescribed medications as instructed. 3)Follow up with pulmonology as an outpatient in 4 weeks. Name and number of the provider has been attached.   Increase activity slowly   Complete by:  As directed      Allergies as of 08/15/2018      Reactions   Methotrexate Derivatives  Diarrhea, Other (See Comments)    Weakness; Severe bone pain; No Appetite Methotrexate with preservatives      Medication List    TAKE these medications   acetaminophen 325 MG tablet Commonly known as:  TYLENOL Take 650 mg by mouth daily as needed for moderate pain.   amLODipine 10 MG tablet Commonly known as:  NORVASC Take 1 tablet (10 mg total) by mouth daily. Start taking on:  August 16, 2018 What changed:    medication strength  how much to take   atorvastatin 10 MG tablet Commonly known as:  LIPITOR Take 1 tablet (10 mg total) by mouth at bedtime.   B-D TB SYRINGE 1CC/26GX3/8" 26G X 3/8" 1 ML Misc Generic drug:  TUBERCULIN SYR 1CC/26GX3/8" USE TO INJECT methotrexate ONCE every WEEK AS DIRECTED   escitalopram 20 MG tablet Commonly known as:  LEXAPRO Take 0.5 tablets (10 mg total) by mouth daily.   FISH OIL PO Take by mouth.   folic acid 1 MG tablet Commonly known as:  FOLVITE Take 1 tablet by mouth daily.   furosemide 20 MG tablet Commonly known as:  Lasix Take 1 tablet (20 mg total) by mouth daily for 30 days.   guaiFENesin 600 MG 12 hr tablet Commonly known as:  MUCINEX Take 2 tablets (1,200 mg total) by mouth 2 (two) times daily as needed.   leuprolide 30 MG injection Commonly known as:  LUPRON Inject 30 mg into the muscle every 3 (three) months.   lisinopril 10 MG tablet Commonly known as:  ZESTRIL TAKE ONE TABLET BY MOUTH EVERY DAY   methotrexate 50 MG/2ML injection Inject 25 mg into the vein once a week.   multivitamin with minerals Tabs tablet Take 1 tablet by mouth daily. Start taking on:  August 16, 2018   OLIVE LEAF PO Take 1 tablet by mouth daily.   predniSONE 5 MG tablet Commonly known as:  DELTASONE Take 1 tablet (5 mg total) by mouth daily. Continue only after finishing the tapering dose of prednisone What changed:  additional instructions   predniSONE 10 MG tablet Commonly known as:  DELTASONE Take 4 pills daily for 3 days then 3 pills daily for 3 days then 2  pills daily for 3 days then 1 pill daily for 3 days then continue 5 mg daily What changed:  You were already taking a medication with the same name, and this prescription was added. Make sure you understand how and when to take each.   UNABLE TO FIND Take 500 mg by mouth daily. GRAVIOLA   UNABLE TO FIND Take 1 tablet by mouth every other day. *INTESTINAL SOOTHE AND BUILD   VITAMIN B-COMPLEX PO Take 1 tablet by mouth daily.   Vitamin D3 125 MCG (5000 UT) Tabs Take 1-2 tablets by mouth daily.            Durable Medical Equipment  (From admission, onward)         Start     Ordered   08/15/18 0857  For home use only DME oxygen  Once    Question Answer Comment  Mode or (Route) Nasal cannula   Liters per Minute 2   Frequency Continuous (stationary and portable oxygen unit needed)   Oxygen delivery system Gas      08/15/18 0856         Follow-up Information    [name], PA-C. Schedule an appointment as soon as possible for a visit in 1 week(s).   Specialty:  Family Medicine Contact information: [place] [address] [place] [address] [phone]        Marshell Garfinkel, MD. Schedule an appointment as soon as possible for a visit in 4 week(s).   Specialty:  Pulmonary Disease Contact information: [place] [address] [phone]          Allergies  Allergen Reactions  . Methotrexate Derivatives Diarrhea and Other (See Comments)    Weakness; Severe bone pain; No Appetite Methotrexate with preservatives    Consultations:  PCCM   Procedures/Studies: Ct Angio Chest Pe W Or Wo Contrast  Result Date: 08/09/2018 CLINICAL DATA:  Persistent hypoxia, negative COVID EXAM: CT ANGIOGRAPHY CHEST WITH CONTRAST TECHNIQUE: Multidetector CT imaging of the chest was performed using the standard protocol during bolus administration of intravenous contrast. Multiplanar CT image reconstructions and MIPs were obtained to  evaluate the vascular anatomy. CONTRAST:  163mL OMNIPAQUE IOHEXOL 350 MG/ML SOLN COMPARISON:  Chest radiographs, 08/06/2018 FINDINGS: Cardiovascular: Satisfactory opacification of the pulmonary arteries to the segmental level. No evidence of pulmonary embolism. Normal heart size. Scattered coronary artery calcifications. Aortic atherosclerosis. No pericardial effusion. Mediastinum/Nodes: No enlarged mediastinal, hilar, or axillary lymph nodes. Thyroid gland, trachea, and esophagus demonstrate no significant findings. Lungs/Pleura: Moderate to severe pulmonary fibrosis in a pattern featuring mild traction bronchiectasis and multiple areas of bronchiolectasis and possible honeycombing, for example in left upper lobe. There is no significant apical to basal gradient. Moderate underlying emphysema. There are areas of ground-glass and consolidative airspace opacity in the right greater than left lung bases. There are areas of dense consolidation about the fissures (e.g. series 6, image 72). No pleural effusion or pneumothorax. Upper Abdomen: No acute abnormality. Musculoskeletal: No chest wall abnormality. No acute or significant osseous findings. Review of the MIP images confirms the above findings. IMPRESSION: 1.  Negative examination for pulmonary embolism. 2. Moderate to severe pulmonary fibrosis in a pattern featuring mild traction bronchiectasis and multiple areas of bronchiolectasis and possible honeycombing, for example in left upper lobe. There is no significant apical to basal gradient. Moderate underlying emphysema. 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 There are areas of dense consolidation about the fissures (e.g. series 6, image 72), which are likely related to chronic scarring. Recommend follow-up CT for these areas in 3 months to exclude underlying mass. 3. There are areas of ground-glass and consolidative airspace opacity in the right greater than  left lung bases, consistent with multifocal infection. 4.  Coronary artery disease and aortic atherosclerosis. Electronically Signed   By: Eddie Candle M.D.   On: 08/09/2018 10:56   Dg Chest Port 1 View  Result Date: 08/06/2018 CLINICAL DATA:  Evaluate pneumothorax.  COVID-19 pneumonia. EXAM: PORTABLE CHEST 1 VIEW COMPARISON:  08/04/2018 FINDINGS: Findings right internal jugular line tip at low SVC. Removal of left chest tube, without pneumothorax. Mild cardiomegaly. Atherosclerosis in the transverse aorta. No pleural fluid. Left greater than right and basilar predominant interstitial opacities are similar, given differences in technique. IMPRESSION: No significant change in left greater than right interstitial opacities, consistent with pneumonia. Removal of left chest tube, without pneumothorax or pleural fluid. Aortic Atherosclerosis (ICD10-I70.0). Electronically Signed   By: Abigail Miyamoto M.D.   On: 08/06/2018 08:51   Dg Chest Port 1 View  Result Date: 08/04/2018 CLINICAL DATA:  Respiratory failure EXAM: PORTABLE CHEST 1 VIEW COMPARISON:  08/04/2018 FINDINGS: Left chest tube is stable. Left jugular central venous catheter  is stable with its tip at the cavoatrial junction. Extensive bilateral airspace disease is not significantly changed. No pneumothorax. IMPRESSION: Stable extensive bilateral airspace disease. Stable left chest tube without pneumothorax. Electronically Signed   By: Marybelle Killings M.D.   On: 08/04/2018 11:07   Dg Chest Port 1 View  Result Date: 08/04/2018 CLINICAL DATA:  Respiratory failure EXAM: PORTABLE CHEST 1 VIEW COMPARISON:  08/03/2018 FINDINGS: Pigtail chest tube on the left unchanged. No pneumothorax. Left jugular central venous catheter tip SVC/RA junction unchanged. Diffuse bilateral airspace disease left greater than right appears unchanged. Small left effusion unchanged. Underlying COPD. IMPRESSION: Left chest tube in place no pneumothorax Diffuse bilateral airspace disease  left greater than right unchanged. Electronically Signed   By: Franchot Gallo M.D.   On: 08/04/2018 07:56   Dg Chest Port 1 View  Result Date: 08/03/2018 CLINICAL DATA:  Status post extubation today. EXAM: PORTABLE CHEST 1 VIEW COMPARISON:  Single-view of the chest 08/01/2018. FINDINGS: ETT and NG tube been removed. Left IJ catheter is unchanged. Left chest tube has backed out slightly. Lung volumes are low. Coarsening of the pulmonary interstitium is worse on the left unchanged no pneumothorax or pleural effusion. Heart size is normal. Aortic atherosclerosis is noted. IMPRESSION: Status post extubation and NG tube removal. The patient's left chest tube has backed out slightly. No pneumothorax. Coarsening of the interstitial is much worse on the left and unchanged. Electronically Signed   By: Inge Rise M.D.   On: 08/03/2018 12:53   Dg Chest Port 1 View  Result Date: 08/01/2018 CLINICAL DATA:  Pneumothorax. Chest tube placement. EXAM: PORTABLE CHEST 1 VIEW COMPARISON:  Earlier this day at 1749 hour, radiograph 07/30/2018 FINDINGS: Placement of left pigtail catheter with tip near the apex. Left pneumothorax is significantly decreased in size and near completely resolved. Possible tiny lucency remains at the left lung apex and laterally. Endotracheal tube, enteric tube, and left central line remain in place. Diffuse bilateral interstitial coarsening and patchy opacities throughout the left lung. IMPRESSION: Placement of left pigtail catheter with significantly decreased size of left pneumothorax. Possible tiny residual remains at the left lung apex and laterally. Diffuse bilateral interstitial opacities patchy opacities throughout the left lung, with some improvement from 07/30/2018 radiograph. Electronically Signed   By: Keith Rake M.D.   On: 08/01/2018 20:36   Dg Chest Port 1 View  Result Date: 08/01/2018 CLINICAL DATA:  Endotracheal tube placement. Central line placement. EXAM: PORTABLE CHEST  1 VIEW COMPARISON:  07/30/2018 FINDINGS: 1749 hours. Endotracheal tube tip is 3.7 cm above the base of the carina. The NG tube passes into the stomach although the distal tip position is not included on the film. Left IJ central line tip overlies the mid to distal SVC level. Underlying diffuse interstitial opacity again noted with apparent improvement in airspace disease and better lung aeration. Patient is noted to have a left pneumothorax, of moderate to large size. Telemetry leads overlie the chest. IMPRESSION: Moderate to large left pneumothorax. Diffuse underlying interstitial opacity in both lungs with some improvement in the airspace disease seen previously. Endotracheal tube, left IJ central line, and NG tube are new in the interval. I called the results of this study to the charge nurse, Zoe, and the clinical team was already aware of the pneumothorax she reported that a left chest tube has already been placed. Electronically Signed   By: Misty Stanley M.D.   On: 08/01/2018 19:10   Dg Chest [facility] 1 View  Result  Date: 07/30/2018 CLINICAL DATA:  Sob and cough x 1 week. No fever at this time. EXAM: PORTABLE CHEST 1 VIEW COMPARISON:  01/09/2014 FINDINGS: There are bilateral interstitial and airspace opacities, with airspace opacity most prominent in the peripheral left lung. There are additional chronic areas of scarring in both upper lobes stable from the prior exam. No convincing pleural effusion.  No pneumothorax. Cardiac silhouette is normal in size. No mediastinal or hilar masses. Skeletal structures are grossly intact. IMPRESSION: 1. Bilateral, left greater than right, interstitial and airspace lung opacities consistent with multifocal pneumonia. Electronically Signed   By: Lajean Manes M.D.   On: 07/30/2018 12:55      Subjective: Patient seen and examined at bedside this morning.   Hemodynamically stable.  Complains of generalized weakness.  Discussed discharge planning.  Called wife for update.   Stable for discharge today to home with home health.  Discharge Exam: Vitals:   08/15/18 0817 08/15/18 0823  BP:    Pulse:    Resp:    Temp:    SpO2: 95% 94%   Vitals:   08/15/18 0700 08/15/18 0800 08/15/18 0817 08/15/18 0823  BP: 118/67 112/76    Pulse: 62 63    Resp: (!) 29 (!) 27    Temp:      TempSrc:      SpO2: 97% 95% 95% 94%  Weight:      Height:        General: Pt is alert, awake, not in acute distress Cardiovascular: RRR, S1/S2 +, no rubs, no gallops Respiratory: Bilateral decreased air entry  abdominal: Soft, NT, ND, bowel sounds + Extremities: no edema, no cyanosis    The results of significant diagnostics from this hospitalization (including imaging, microbiology, ancillary and laboratory) are listed below for reference.     Microbiology: Recent Results (from the past 240 hour(s))  SARS Coronavirus 2 [facility] order, Performed in [facility] hospital lab)     Status: None   Collection Time: 08/08/18  9:44 AM  Result Value Ref Range Status   SARS Coronavirus 2 NEGATIVE NEGATIVE Final    Comment: (NOTE) If result is NEGATIVE SARS-CoV-2 target nucleic acids are NOT DETECTED. The SARS-CoV-2 RNA is generally detectable in upper and lower  respiratory specimens during the acute phase of infection. The lowest  concentration of SARS-CoV-2 viral copies this assay can detect is 250  copies / mL. A negative result does not preclude SARS-CoV-2 infection  and should not be used as the sole basis for treatment or other  patient management decisions.  A negative result may occur with  improper specimen collection / handling, submission of specimen other  than nasopharyngeal swab, presence of viral mutation(s) within the  areas targeted by this assay, and inadequate number of viral copies  (<250 copies / mL). A negative result must be combined with clinical  observations, patient history, and epidemiological information. If result is POSITIVE SARS-CoV-2 target  nucleic acids are DETECTED. The SARS-CoV-2 RNA is generally detectable in upper and lower  respiratory specimens dur ing the acute phase of infection.  Positive  results are indicative of active infection with SARS-CoV-2.  Clinical  correlation with patient history and other diagnostic information is  necessary to determine patient infection status.  Positive results do  not rule out bacterial infection or co-infection with other viruses. If result is PRESUMPTIVE POSTIVE SARS-CoV-2 nucleic acids MAY BE PRESENT.   A presumptive positive result was obtained on the submitted specimen  and confirmed on  repeat testing.  While 2019 novel coronavirus  (SARS-CoV-2) nucleic acids may be present in the submitted sample  additional confirmatory testing may be necessary for epidemiological  and / or clinical management purposes  to differentiate between  SARS-CoV-2 and other Sarbecovirus currently known to infect humans.  If clinically indicated additional testing with an alternate test  methodology [phone]) is advised. The SARS-CoV-2 RNA is generally  detectable in upper and lower respiratory sp ecimens during the acute  phase of infection. The expected result is Negative. Fact Sheet for Patients:  StrictlyIdeas.no Fact Sheet for Healthcare Providers: BankingDealers.co.za This test is not yet approved or cleared by the [place] FDA and has been authorized for detection and/or diagnosis of SARS-CoV-2 by FDA under an Emergency Use Authorization (EUA).  This EUA will remain in effect (meaning this test can be used) for the duration of the COVID-19 declaration under Section 564(b)(1) of the Act, 21 U.S.C. section 360bbb-3(b)(1), unless the authorization is terminated or revoked sooner. Performed at [facility], [place] [address] [place], [place] [address]      Labs: BNP (last 3 results) Recent Labs    07/30/18 1221  BNP 58.0    Basic Metabolic Panel: Recent Labs  Lab 08/10/18 0500 08/11/18 0236 08/12/18 0235 08/13/18 1055 08/15/18 0252  NA 135 128* 132* 133* 131*  K 3.7 3.1* 3.9 3.5 4.6  CL 95* 90* 94* 96* 97*  CO2 29 27 25 26 24   GLUCOSE 150* 167* 152* 203* 97  BUN 26* 37* 45* 43* 47*  CREATININE 1.09 1.17 1.37* 1.16 1.40*  CALCIUM 9.0 8.5* 8.6* 8.4* 8.3*  MG  --   --  2.3  --   --    Liver Function Tests: Recent Labs  Lab 08/09/18 0350 08/10/18 0500 08/11/18 0236  AST 19 19 25   ALT 33 35 37  ALKPHOS 56 54 49  BILITOT 0.5 0.5 0.6  PROT 6.3* 6.9 6.4*  ALBUMIN 2.7* 3.1* 2.8*   No results for input(s): LIPASE, AMYLASE in the last 168 hours. No results for input(s): AMMONIA in the last 168 hours. CBC: Recent Labs  Lab 08/09/18 0350 08/10/18 0500 08/12/18 0235 08/13/18 1055 08/15/18 0252  WBC 16.0* 10.7* 22.3* 22.4* 23.0*  NEUTROABS  --  9.4* 20.3* 20.2* 18.4*  HGB 10.8* 11.0* 10.1* 11.2* 10.9*  HCT 34.0* 34.4* 32.1* 34.6* 34.8*  MCV 92.4 91.5 90.4 90.3 90.4  PLT 351 395 392 410* 411*   Cardiac Enzymes: No results for input(s): CKTOTAL, CKMB, CKMBINDEX, TROPONINI in the last 168 hours. BNP: Invalid input(s): POCBNP CBG: No results for input(s): GLUCAP in the last 168 hours. D-Dimer No results for input(s): DDIMER in the last 72 hours. Hgb A1c No results for input(s): HGBA1C in the last 72 hours. Lipid Profile No results for input(s): CHOL, HDL, LDLCALC, TRIG, CHOLHDL, LDLDIRECT in the last 72 hours. Thyroid function studies No results for input(s): TSH, T4TOTAL, T3FREE, THYROIDAB in the last 72 hours.  Invalid input(s): FREET3 Anemia work up No results for input(s): VITAMINB12, FOLATE, FERRITIN, TIBC, IRON, RETICCTPCT in the last 72 hours. Urinalysis    Component Value Date/Time   COLORURINE YELLOW 07/30/2018 1224   APPEARANCEUR CLEAR 07/30/2018 1224   LABSPEC 1.015 07/30/2018 1224   PHURINE 5.0 07/30/2018 [place] 07/30/2018 1224   GLUCOSEU NEGATIVE  11/04/2015 0948   HGBUR MODERATE (A) 07/30/2018 [place] 07/30/2018 [place] 09/24/2013 1412   KETONESUR NEGATIVE 07/30/2018 1224  PROTEINUR NEGATIVE 07/30/2018 1224   UROBILINOGEN 0.2 11/04/2015 0948   NITRITE NEGATIVE 07/30/2018 1224   LEUKOCYTESUR NEGATIVE 07/30/2018 1224   Sepsis Labs Invalid input(s): PROCALCITONIN,  WBC,  LACTICIDVEN Microbiology Recent Results (from the past 240 hour(s))  SARS Coronavirus 2 [facility] order, Performed in [place] hospital lab)     Status: None   Collection Time: 08/08/18  9:44 AM  Result Value Ref Range Status   SARS Coronavirus 2 NEGATIVE NEGATIVE Final    Comment: (NOTE) If result is NEGATIVE SARS-CoV-2 target nucleic acids are NOT DETECTED. The SARS-CoV-2 RNA is generally detectable in upper and lower  respiratory specimens during the acute phase of infection. The lowest  concentration of SARS-CoV-2 viral copies this assay can detect is 250  copies / mL. A negative result does not preclude SARS-CoV-2 infection  and should not be used as the sole basis for treatment or other  patient management decisions.  A negative result may occur with  improper specimen collection / handling, submission of specimen other  than nasopharyngeal swab, presence of viral mutation(s) within the  areas targeted by this assay, and inadequate number of viral copies  (<250 copies / mL). A negative result must be combined with clinical  observations, patient history, and epidemiological information. If result is POSITIVE SARS-CoV-2 target nucleic acids are DETECTED. The SARS-CoV-2 RNA is generally detectable in upper and lower  respiratory specimens dur ing the acute phase of infection.  Positive  results are indicative of active infection with SARS-CoV-2.  Clinical  correlation with patient history and other diagnostic information is  necessary to determine patient infection status.  Positive results do  not rule out  bacterial infection or co-infection with other viruses. If result is PRESUMPTIVE POSTIVE SARS-CoV-2 nucleic acids MAY BE PRESENT.   A presumptive positive result was obtained on the submitted specimen  and confirmed on repeat testing.  While 2019 novel coronavirus  (SARS-CoV-2) nucleic acids may be present in the submitted sample  additional confirmatory testing may be necessary for epidemiological  and / or clinical management purposes  to differentiate between  SARS-CoV-2 and other Sarbecovirus currently known to infect humans.  If clinically indicated additional testing with an alternate test  methodology [phone]) is advised. The SARS-CoV-2 RNA is generally  detectable in upper and lower respiratory sp ecimens during the acute  phase of infection. The expected result is Negative. Fact Sheet for Patients:  StrictlyIdeas.no Fact Sheet for Healthcare Providers: BankingDealers.co.za This test is not yet approved or cleared by the [place] FDA and has been authorized for detection and/or diagnosis of SARS-CoV-2 by FDA under an Emergency Use Authorization (EUA).  This EUA will remain in effect (meaning this test can be used) for the duration of the COVID-19 declaration under Section 564(b)(1) of the Act, 21 U.S.C. section 360bbb-3(b)(1), unless the authorization is terminated or revoked sooner. Performed at [facility], [place] [address] [place], [place] [address]     Please note: You were cared for by a hospitalist during your hospital stay. Once you are discharged, your primary care physician will handle any further medical issues. Please note that NO REFILLS for any discharge medications will be authorized once you are discharged, as it is imperative that you return to your primary care physician (or establish a relationship with a primary care physician if you do not have one) for your post hospital discharge needs so that they  can reassess your need for medications and monitor your lab values.  Time coordinating discharge: 40 minutes  SIGNED:   Shelly Coss, MD  [facility] 08/15/2018, 9:13 AM Pager [phone]  If 7PM-7AM, please contact night-coverage [email or web address] Password [email or web address]

## 2018-08-15 NOTE — Progress Notes (Addendum)
Physical Therapy Treatment Patient Details Name: Joel Valdez. Joel Valdez MRN: [id number] DOB: Oct 28, 1945 Today's Date: 08/15/2018    History of Present Illness Pt admitted 2* SOB with acute respiratory failure and intubated 07/31/08 and extubated 08/02/08.  Pt  with hx of RA, asthma, anxiety/depression, and deafness R ear    PT Comments    Pt continues motivated and ambulating increased distance in hall this date but with continued noted increased WOB.  Pt on 6L O2 and desat twice to 88% with HR max at 86BPM but RR up to 40.   Follow Up Recommendations  Home health PT;SNF     Equipment Recommendations       Recommendations for Other Services       Precautions / Restrictions Precautions Precautions: Fall Precaution Comments: monitor sats Restrictions Weight Bearing Restrictions: No    Mobility  Bed Mobility Overal bed mobility: Needs Assistance Bed Mobility: Supine to Sit     Supine to sit: Modified independent (Device/Increase time)        Transfers Overall transfer level: Needs assistance Equipment used: Rolling walker (2 wheeled) Transfers: Sit to/from Stand Sit to Stand: Min guard         General transfer comment: cues for use of UEs to self assist  Ambulation/Gait Ambulation/Gait assistance: Min guard Gait Distance (Feet): 70 Feet(twice) Assistive device: Rolling walker (2 wheeled) Gait Pattern/deviations: Step-to pattern Gait velocity: decr   General Gait Details: multiple short standing rests to complete task with cues for breathing techniques   Stairs             Wheelchair Mobility    Modified Rankin (Stroke Patients Only)       Balance Overall balance assessment: Needs assistance Sitting-balance support: No upper extremity supported;Feet supported Sitting balance-Leahy Scale: Good     Standing balance support: Bilateral upper extremity supported Standing balance-Leahy Scale: Fair                              Cognition  Arousal/Alertness: Awake/alert Behavior During Therapy: WFL for tasks assessed/performed Overall Cognitive Status: Within Functional Limits for tasks assessed                                 General Comments: Pt fatigued but motivated to move      Exercises      General Comments        Pertinent Vitals/Pain Pain Assessment: No/denies pain Faces Pain Scale: Hurts a little bit Pain Location: shoulders" but they are better" Pain Descriptors / Indicators: Aching Pain Intervention(s): Limited activity within patient's tolerance;Monitored during session    Home Living                      Prior Function            PT Goals (current goals can now be found in the care plan section) Acute Rehab PT Goals Patient Stated Goal: get stronger PT Goal Formulation: With patient Time For Goal Achievement: 08/17/18 Potential to Achieve Goals: Good Progress towards PT goals: Progressing toward goals    Frequency    Min 3X/week      PT Plan Current plan remains appropriate    Co-evaluation              AM-PAC PT "6 Clicks" Mobility   Outcome Measure  Help needed turning from your back to  your side while in a flat bed without using bedrails?: None Help needed moving from lying on your back to sitting on the side of a flat bed without using bedrails?: None Help needed moving to and from a bed to a chair (including a wheelchair)?: A Little Help needed standing up from a chair using your arms (e.g., wheelchair or bedside chair)?: A Little Help needed to walk in hospital room?: A Little Help needed climbing 3-5 steps with a railing? : A Lot 6 Click Score: 19    End of Session Equipment Utilized During Treatment: Oxygen;Gait belt Activity Tolerance: Patient tolerated treatment well;Patient limited by fatigue Patient left: in chair;with call bell/phone within reach Nurse Communication: Mobility status PT Visit Diagnosis: Unsteadiness on feet (R26.81)      Time: 3794-3276 PT Time Calculation (min) (ACUTE ONLY): 27 min  Charges:  $Gait Training: 23-37 mins                     [place] Pager [phone] Office [phone]    Carlinda Ohlson 08/15/2018, 12:59 PM

## 2018-08-15 NOTE — Progress Notes (Addendum)
Pt 02 sat 98 percent after rn weaning 02 Kennedy to 2lnc at rest. No needs at this time. No changes to note overall. Will continue to monitor.

## 2018-08-16 ENCOUNTER — Telehealth: Payer: Self-pay

## 2018-08-16 ENCOUNTER — Telehealth: Payer: Self-pay | Admitting: Physician Assistant

## 2018-08-16 DIAGNOSIS — J9601 Acute respiratory failure with hypoxia: Secondary | ICD-10-CM | POA: Diagnosis not present

## 2018-08-16 DIAGNOSIS — I10 Essential (primary) hypertension: Secondary | ICD-10-CM | POA: Diagnosis not present

## 2018-08-16 DIAGNOSIS — J1289 Other viral pneumonia: Secondary | ICD-10-CM | POA: Diagnosis not present

## 2018-08-16 DIAGNOSIS — J939 Pneumothorax, unspecified: Secondary | ICD-10-CM | POA: Diagnosis not present

## 2018-08-16 NOTE — Telephone Encounter (Signed)
Spoke with patient for a few seconds, then to wife Joel Valdez) the rest of call.   Transition Care Management Follow-up Telephone Call  Admission Dates: 07/30/2018-08/15/2018 Dx: Pneumonia d/t COVID-19 virus   How have you been since you were released from the hospital? "I'm hangin in there"   Do you understand why you were in the hospital? yes   Do you understand the discharge instructions? yes, Catherine RN reviewed discharge instructions and medications.    Where were you discharged to? Home. Resides with family. HH RN 2/week x 2 weeks.    Items Reviewed:  Medications reviewed: no, wife states [facility] RN reviewed.   Allergies reviewed: yes  Dietary changes reviewed: yes  Referrals reviewed: yes. Pulmonary appt in 4 weeks.    Functional Questionnaire:   Activities of Daily Living (ADLs):   He states they are independent in the following: ambulation, bathing and hygiene, feeding, continence, grooming, toileting and dressing. Wife states pt becomes SOB with walking to bathroom. Wife/family assisting.  States they require assistance with the following: None.    Any transportation issues/concerns?: no   Any patient concerns? yes, Wife states discharge instructions state Oxygen should be 2 Liters/min. Was discharged on 4 Liters so she turned down to 3 Liters. Oxygen sat dropped to 88% when going to bathroom. Advised to keep O2 sats >92%.    Confirmed importance and date/time of follow-up visits scheduled yes  Provider Appointment booked with PCP Tuesday, 08/21/2018. Telephone call, no access to internet/smartphone.   Confirmed with patient if condition begins to worsen call PCP or go to the ER.  Patient was given the office number and encouraged to call back with question or concerns.  : yes

## 2018-08-16 NOTE — Telephone Encounter (Signed)
Ok to give verbal orders?

## 2018-08-16 NOTE — Telephone Encounter (Signed)
Gave OK per PCP

## 2018-08-16 NOTE — Telephone Encounter (Signed)
Copied from [place] [id number]. Topic: Quick Communication - Home Health Verbal Orders >> Aug 16, 2018  2:47 PM Lennox Solders wrote: Caller/Agency: [facility] Callback Number: [phone] Requesting Skilled Nursing Frequency once this week and twice next week and finish with once weekly for 3 wks also needs PT evaluation

## 2018-08-17 ENCOUNTER — Telehealth: Payer: Self-pay

## 2018-08-17 DIAGNOSIS — I10 Essential (primary) hypertension: Secondary | ICD-10-CM | POA: Diagnosis not present

## 2018-08-17 DIAGNOSIS — J9601 Acute respiratory failure with hypoxia: Secondary | ICD-10-CM | POA: Diagnosis not present

## 2018-08-17 DIAGNOSIS — J1289 Other viral pneumonia: Secondary | ICD-10-CM | POA: Diagnosis not present

## 2018-08-17 DIAGNOSIS — J939 Pneumothorax, unspecified: Secondary | ICD-10-CM | POA: Diagnosis not present

## 2018-08-17 NOTE — Telephone Encounter (Signed)
Spoke with patient regarding symptoms (wife LM on PCP voicemail stating pt c/o Chest Pain)  Patient reports left sided chest pain, 8-9 on pain scale, worse with deep breath. He states SOB is about the same since hospital discharge. Respiratory rate sounds rapid over phone. Pulse Ox 94%.  Encouraged patient to go to ER, patient refused several times. Patient requesting pain medication.  Per PCP encouraged patient to at least allow EMS to come to home for assessment. Patient declined and hung up.

## 2018-08-17 NOTE — Telephone Encounter (Signed)
Called patient personally  -- chest pain since this morning and worsening. Solely left-sided and 9-10/10 currently. Denies SOB but is audibly dyspneic with out audible wheezing. O2 remains at 94% at rest. Denies fever, chills, sweats. Agrees to EMS assessment after discussion of concerns. Wife had momentarily stepped outside so I personally called [facility] who are on their way to the home now.

## 2018-08-20 ENCOUNTER — Telehealth: Payer: Self-pay | Admitting: Physician Assistant

## 2018-08-20 DIAGNOSIS — J9601 Acute respiratory failure with hypoxia: Secondary | ICD-10-CM | POA: Diagnosis not present

## 2018-08-20 DIAGNOSIS — I10 Essential (primary) hypertension: Secondary | ICD-10-CM | POA: Diagnosis not present

## 2018-08-20 DIAGNOSIS — J939 Pneumothorax, unspecified: Secondary | ICD-10-CM | POA: Diagnosis not present

## 2018-08-20 DIAGNOSIS — J1289 Other viral pneumonia: Secondary | ICD-10-CM | POA: Diagnosis not present

## 2018-08-20 NOTE — Telephone Encounter (Signed)
Again as discussed with him and wife Friday, my concern is for blood clot versus other acute cardiopulmonary process. He refused ER triage. I am very upset the EMS did not call another vehicle in to get an EKG on him with active left-sided chest pain. Again I recommend ER for quick pain relief and to find out what is wrong.   Please call them and let them know this.  Also, please call the [facility] RN to make her aware so she can call us ASAP with vitals and assessment if he still refuses ER assessment when you call him

## 2018-08-20 NOTE — Telephone Encounter (Signed)
Can we please call patient to see how he is this week. EMS was called Friday but patient was not taken to any of our ER facilities.

## 2018-08-20 NOTE — Telephone Encounter (Signed)
Spoke with patient wife [name]. Patient is still refusing to go to the ER to further assessment.  Patient requesting Hydrocodone for pain. [name] states he doesn't want to find out what is causing the pain he just wants something to stop the pain. She states he doesn't complain of pain often. He is still taking the Tylenol. [name] wants to switch home health from [facility] to [facility]

## 2018-08-20 NOTE — Telephone Encounter (Signed)
Spoke with Joel Valdez. Still having the chest pain. He had collapsed lung but no documentation of which lung. He is taking Tylenol with no relief. EKG machine didn't work when EMS came to evaluate. His lungs sound good. He has low pain tolerance. Nurse is coming today [place] with [employer] [employer]. O2 on 3 when sitting and sleeping. 4 when walking around.

## 2018-08-20 NOTE — Telephone Encounter (Signed)
I will not given him hydrocodone as (1) he has not had an assessment as recommended several times and (2) giving new need for oxycgen, the hydrocodone can cause respiratory depression and worsen this. Again recommend ER or at least Urgent Care assessment. If he is declining we will discuss at tomorrow's appointment and decide what is appropriate from there.

## 2018-08-20 NOTE — Telephone Encounter (Signed)
Copied from [place] [id number]. Topic: Quick Communication - See Telephone Encounter >> Aug 20, 2018 12:45 PM Robina Ade, Helene Kelp D wrote: CRM for notification. See Telephone encounter for: 08/20/18. Sherata with home health called and would like to let Tommi Rumps know that patient is complaining of pain under his left lung. Patient has a hospital f/u appt and the pain is where the reason patient went to ED. She would like for [facility] or his CMA to call her if a medication is given to patient, her call back number is [phone].

## 2018-08-21 ENCOUNTER — Other Ambulatory Visit (INDEPENDENT_AMBULATORY_CARE_PROVIDER_SITE_OTHER): Payer: Medicare Other

## 2018-08-21 ENCOUNTER — Ambulatory Visit (HOSPITAL_BASED_OUTPATIENT_CLINIC_OR_DEPARTMENT_OTHER)
Admission: RE | Admit: 2018-08-21 | Discharge: 2018-08-21 | Disposition: A | Discharge status: Home / Self Care | Payer: Medicare Other | Source: Ambulatory Visit | Attending: Physician Assistant | Admitting: Physician Assistant

## 2018-08-21 ENCOUNTER — Other Ambulatory Visit: Payer: Self-pay

## 2018-08-21 ENCOUNTER — Ambulatory Visit (INDEPENDENT_AMBULATORY_CARE_PROVIDER_SITE_OTHER): Payer: Medicare Other | Admitting: Physician Assistant

## 2018-08-21 ENCOUNTER — Encounter: Payer: Self-pay | Admitting: Physician Assistant

## 2018-08-21 VITALS — HR 84 | Temp 97.7°F

## 2018-08-21 DIAGNOSIS — J1289 Other viral pneumonia: Secondary | ICD-10-CM

## 2018-08-21 DIAGNOSIS — M05741 Rheumatoid arthritis with rheumatoid factor of right hand without organ or systems involvement: Secondary | ICD-10-CM | POA: Diagnosis not present

## 2018-08-21 DIAGNOSIS — R0789 Other chest pain: Secondary | ICD-10-CM | POA: Insufficient documentation

## 2018-08-21 DIAGNOSIS — J9601 Acute respiratory failure with hypoxia: Secondary | ICD-10-CM | POA: Diagnosis not present

## 2018-08-21 DIAGNOSIS — N179 Acute kidney failure, unspecified: Secondary | ICD-10-CM | POA: Diagnosis not present

## 2018-08-21 DIAGNOSIS — J939 Pneumothorax, unspecified: Secondary | ICD-10-CM | POA: Diagnosis not present

## 2018-08-21 DIAGNOSIS — I10 Essential (primary) hypertension: Secondary | ICD-10-CM

## 2018-08-21 DIAGNOSIS — M05742 Rheumatoid arthritis with rheumatoid factor of left hand without organ or systems involvement: Secondary | ICD-10-CM

## 2018-08-21 DIAGNOSIS — J1282 Pneumonia due to coronavirus disease 2019: Secondary | ICD-10-CM

## 2018-08-21 DIAGNOSIS — R079 Chest pain, unspecified: Secondary | ICD-10-CM | POA: Diagnosis not present

## 2018-08-21 NOTE — Telephone Encounter (Signed)
Patient has hospital follow up today with PCP

## 2018-08-21 NOTE — Progress Notes (Signed)
Virtual Visit via Video   I connected with patient on 08/22/18 at  1:00 PM EDT by telephone (no computer or smart phone) and verified that I am speaking with the correct person using two identifiers.  Location patient: Home Location provider: [facility], Office Persons participating in the virtual visit: Patient, Patient's wife [name] and sister [name], Provider, [place] (Eduard Clos)  I discussed the limitations of evaluation and management by telemedicine and the availability of in person appointments. The patient expressed understanding and agreed to proceed.  Subjective:   HPI:   Patient presents via phone today for hospital follow-up. Patient presented to ER on 07/30/2018 via EMS after this provider initiated 911 during a virtual visit. Patient having significant dypnena, worsening for a few days and noted to be hypoxic on pulse ox testing at home. ER assessment included vitals (temp 99.5, RR 26-28, O2 86% RA), labs (WBC 11.9, elevated esr, LDH at 221, D-Dimer 3.47 and mild elevation of troponin at 0.04), CXR (multifocal bilateral infiltrate). Influenza follow-up PCR was negative. Patient was given IV fluids, started on antibiotics and cultures taken at that time. Patient was admitted for further assessment/management and placed on COVID precautions.  Brief Hospital Course as noted below. For detailed report, please refer to appropriate areas of EMR:  Patient initially requiring intubation on 08/01/2018 and extubated on 08/03/2018. He subsequently developed a small pneumothorax on 08/01/2018 requiring placement of chest tube. This was discontinued on 08/04/2018. COVID 19 test negative x 2. CT Chest showed extensive fibrotic changes. No evidence of PE. Patient initially requiring 15 L O2 after extubation. Was successfully weaned to 2L/min per Hospitalist noted. Was felt stable to be discharged home on 08/15/2018 with [facility] RN and PT. Recommended follow-up with Primary Care in 1 week with repeat  labs, and follow-up with Pulmonology in 4 weeks.   Since discharge patient notes doing well overall. Is wearing 3.5-4L O2 depending on if he is resting or up and moving around/PT. Patient, wife and sister state that he was on 4L when picked up from the hospital despite the reports saying he was weaned to 2L. Denies dyspnea on exertion or at rest. States his voice is still quite hoarse but denies sore throat. Denies chest congestion or wheezing. Notes a very rare dry cough still but this is improving. Notes he still feels very tired but feels it is slowly improving. Is noting pain in chest which was discussed last Friday via phone call with patient and wife. ER disposition recommended but he refused. Did agree to EMS evaluation. Stated they cam out and assessed him with good oxygen levels and vital signs. They were unable to perform an EKG as the machine was not working. Wife states they were told that they did not feel he was having any emergent issues. As such he has stayed at home. Notes pain is still present, only slightly improved. Patient described pain as a soreness that is in the lateral left chest and goes down the left side. Notes some soreness in left hip and buttock as well. Notes some increase in pain with taking a deep breath. Denies symptoms of the R side. Is taking all medications as directed. Wife notes home BP levels are staying stable. Denies any new or worsening symptoms. They do not have any scheduled follow-up with Pulmonology.     ROS:   See pertinent positives and negatives per HPI.  Patient Active Problem List   Diagnosis Date Noted   Pneumonia due to COVID-19  virus 07/30/2018   Memory loss 03/30/2018   Hyperlipidemia 04/17/2016   Pelvic lymphadenopathy    Rheumatoid arthritis involving both hands ([place]) 01/26/2015   Depression 11/21/2014   Prostate cancer ([place]) 04/28/2014   Bruit 11/13/2013   Abnormal electrocardiogram 11/13/2013   Glaucoma 09/27/2013    Essential hypertension, benign 09/27/2013   Nocturia 09/27/2013   Colon cancer screening 09/27/2013    Social History   Tobacco Use   Smoking status: Former Smoker    Packs/day: 1.00    Years: 15.00    Pack years: 15.00    Last attempt to quit: 09/25/2006    Years since quitting: 11.9   Smokeless tobacco: Former Systems developer  Substance Use Topics   Alcohol use: No    Current Outpatient Medications:    acetaminophen (TYLENOL) 325 MG tablet, Take 650 mg by mouth daily as needed for moderate pain. , Disp: , Rfl:    albuterol (VENTOLIN HFA) 108 (90 Base) MCG/ACT inhaler, Inhale 2 puffs into the lungs every 6 (six) hours as needed for wheezing or shortness of breath., Disp: 1 Inhaler, Rfl: 0   amLODipine (NORVASC) 10 MG tablet, Take 1 tablet (10 mg total) by mouth daily., Disp: 30 tablet, Rfl: 0   atorvastatin (LIPITOR) 10 MG tablet, Take 1 tablet (10 mg total) by mouth at bedtime., Disp: 90 tablet, Rfl: 1   B Complex Vitamins (VITAMIN B-COMPLEX PO), Take 1 tablet by mouth daily. , Disp: , Rfl:    B-D TB SYRINGE 1CC/26GX3/8" 26G X 3/8" 1 ML MISC, USE TO INJECT methotrexate ONCE every WEEK AS DIRECTED, Disp: , Rfl:    Cholecalciferol (VITAMIN D3) 5000 UNITS TABS, Take 1-2 tablets by mouth daily. , Disp: , Rfl:    escitalopram (LEXAPRO) 20 MG tablet, Take 0.5 tablets (10 mg total) by mouth daily., Disp: 30 tablet, Rfl: 5   folic acid (FOLVITE) 1 MG tablet, Take 1 tablet by mouth daily., Disp: , Rfl:    furosemide (LASIX) 20 MG tablet, Take 1 tablet (20 mg total) by mouth daily for 30 days., Disp: 30 tablet, Rfl: 0   guaiFENesin (MUCINEX) 600 MG 12 hr tablet, Take 2 tablets (1,200 mg total) by mouth 2 (two) times daily as needed., Disp: 14 tablet, Rfl: 0   leuprolide (LUPRON) 30 MG injection, Inject 30 mg into the muscle every 3 (three) months., Disp: , Rfl:    lisinopril (PRINIVIL,ZESTRIL) 10 MG tablet, TAKE ONE TABLET BY MOUTH EVERY DAY (Patient taking differently: Take 10 mg by  mouth daily. ), Disp: 90 tablet, Rfl: 1   methotrexate 50 MG/2ML injection, Inject 25 mg into the vein once a week. , Disp: , Rfl:    Multiple Vitamin (MULTIVITAMIN WITH MINERALS) TABS tablet, Take 1 tablet by mouth daily., Disp: 30 tablet, Rfl: 0   OLIVE LEAF PO, Take 1 tablet by mouth daily. , Disp: , Rfl:    Omega-3 Fatty Acids (FISH OIL PO), Take by mouth., Disp: , Rfl:    predniSONE (DELTASONE) 10 MG tablet, Take 4 pills daily for 3 days then 3 pills daily for 3 days then 2 pills daily for 3 days then 1 pill daily for 3 days then continue 5 mg daily, Disp: 30 tablet, Rfl: 0   UNABLE TO FIND, Take 500 mg by mouth daily. GRAVIOLA, Disp: , Rfl:    UNABLE TO FIND, Take 1 tablet by mouth every other day. *INTESTINAL SOOTHE AND BUILD, Disp: , Rfl:    predniSONE (DELTASONE) 5 MG tablet, Take 1 tablet (  5 mg total) by mouth daily. Continue only after finishing the tapering dose of prednisone (Patient not taking: Reported on 08/21/2018), Disp: , Rfl:   Allergies  Allergen Reactions   Methotrexate Derivatives Diarrhea and Other (See Comments)    Weakness; Severe bone pain; No Appetite Methotrexate with preservatives    Objective:   Pulse 84    Temp 97.7 F (36.5 C) (Tympanic)    SpO2 94% Comment: 4L O2  Patient is well-developed, well-nourished in no acute distress.  No labored breathing when collecting history.  Speech is hoarse and coherent with logical content.  Patient is alert and oriented at baseline.   Assessment and Plan:   1. Acute respiratory failure with hypoxia (HCC) There seems to be some discrepancy on the level of O2 patient was on at discharge. Per Summary it was 2L O2 but patient and multiple family members note he was on 4L when brought down to pick up. Has maintained on 3.5 L since then at rest. 4L during PT at home. Note he is staying in the mid to upper 90s at rest. Sometimes drops to 88-89% when starting to exert himself but this comes back up. He was noted to be  SOB at time of call last week. However this is not noted today. No follow-up scheduled with Pulmonology at present. WIll have [facility] call and help facilitate appt for patient with specialist. Do not feel 4 weeks is a proper time so will see if he can be seen in the next 1-2 weeks.   2. Pneumonia due to COVID-19 virus COVID test negative x 2. Clinically resolved on discharge. Will updated CBC and BMP today to make sure leukocytosis is resolving and renal function back to baseline. He does have some atypical chest pain (see below) for which we are getting repeat CXR, etc.  - DG Chest 2 View; Future - CBC w/Diff; Future - Basic metabolic panel; Future  3. AKI (acute kidney injury) ([place]) Notes hydrating well with good urinary output. Will recheck BMP.   4. Atypical chest pain Patient refusing ER or UC assessment previously. O2 stable currently but he still seems excessively fatigued and somewhat winded with a lot of talking., although better than last assessment. O2 94% at rest and down to 89% while up and moving. Concern giving recent history of recurrent pneumothorax veruss penumonia versus PE or other etiology. D-dimer high throughout hospital stay so likely to be of no clinical use to repeat at present. Even though he still refuses ER/UC assessment and he and family refuse more skilled level of care like SNF for now, they do agree to get the CXR and labs today. (declines CT scan).  Discussed locations and they are familiar with [facility]. STAT CXR ordered along with CBC and BMP. Discussed with sister that if she notes any increased symptoms then she is to take him to ER and she agrees.  - DG Chest 2 View; Future  5. Rheumatoid arthritis involving both hands with positive rheumatoid factor ([place]) Patient is back on his prednisone and methotrexate. Follow-up with Rheumatology as scheduled.   6. Essential hypertension, benign BP stable today. Will have [place] RN continue to monitor. Repeat  BMP.        Leeanne Rio, PA-C 08/22/2018

## 2018-08-21 NOTE — Telephone Encounter (Signed)
Wait to discuss at appointment?

## 2018-08-21 NOTE — Telephone Encounter (Signed)
See prior note. Will not send in medication without assessing patient. He did not go to the ER for assessment when told to Friday and again yesterday. Concern for PE versus pleurisy versus pneumonia. Will assess at visit today as he has refused recommendations previously.

## 2018-08-21 NOTE — Progress Notes (Signed)
I have discussed the procedure for the virtual visit with the patient who has given consent to proceed with assessment and treatment.   Joel Valdez, CMA     

## 2018-08-22 ENCOUNTER — Telehealth: Payer: Self-pay | Admitting: *Deleted

## 2018-08-22 ENCOUNTER — Telehealth: Payer: Self-pay

## 2018-08-22 ENCOUNTER — Encounter (HOSPITAL_BASED_OUTPATIENT_CLINIC_OR_DEPARTMENT_OTHER): Payer: Self-pay

## 2018-08-22 ENCOUNTER — Emergency Department (HOSPITAL_BASED_OUTPATIENT_CLINIC_OR_DEPARTMENT_OTHER): Payer: Medicare Other

## 2018-08-22 ENCOUNTER — Observation Stay (HOSPITAL_BASED_OUTPATIENT_CLINIC_OR_DEPARTMENT_OTHER)
Admission: EM | Admit: 2018-08-22 | Discharge: 2018-08-23 | Disposition: A | Discharge status: Home / Self Care | Payer: Medicare Other | Attending: Internal Medicine | Admitting: Internal Medicine

## 2018-08-22 ENCOUNTER — Other Ambulatory Visit: Payer: Medicare Other

## 2018-08-22 ENCOUNTER — Other Ambulatory Visit: Payer: Self-pay

## 2018-08-22 DIAGNOSIS — I700 Atherosclerosis of aorta: Secondary | ICD-10-CM | POA: Insufficient documentation

## 2018-08-22 DIAGNOSIS — Z8546 Personal history of malignant neoplasm of prostate: Secondary | ICD-10-CM | POA: Insufficient documentation

## 2018-08-22 DIAGNOSIS — K219 Gastro-esophageal reflux disease without esophagitis: Secondary | ICD-10-CM | POA: Diagnosis not present

## 2018-08-22 DIAGNOSIS — Z7952 Long term (current) use of systemic steroids: Secondary | ICD-10-CM | POA: Insufficient documentation

## 2018-08-22 DIAGNOSIS — R7989 Other specified abnormal findings of blood chemistry: Secondary | ICD-10-CM | POA: Insufficient documentation

## 2018-08-22 DIAGNOSIS — E785 Hyperlipidemia, unspecified: Secondary | ICD-10-CM | POA: Diagnosis not present

## 2018-08-22 DIAGNOSIS — L89316 Pressure-induced deep tissue damage of right buttock: Secondary | ICD-10-CM | POA: Insufficient documentation

## 2018-08-22 DIAGNOSIS — Z79899 Other long term (current) drug therapy: Secondary | ICD-10-CM | POA: Diagnosis not present

## 2018-08-22 DIAGNOSIS — M069 Rheumatoid arthritis, unspecified: Secondary | ICD-10-CM | POA: Diagnosis not present

## 2018-08-22 DIAGNOSIS — Z9981 Dependence on supplemental oxygen: Secondary | ICD-10-CM | POA: Insufficient documentation

## 2018-08-22 DIAGNOSIS — Z87891 Personal history of nicotine dependence: Secondary | ICD-10-CM | POA: Diagnosis not present

## 2018-08-22 DIAGNOSIS — I10 Essential (primary) hypertension: Secondary | ICD-10-CM | POA: Diagnosis present

## 2018-08-22 DIAGNOSIS — R079 Chest pain, unspecified: Principal | ICD-10-CM

## 2018-08-22 DIAGNOSIS — J189 Pneumonia, unspecified organism: Secondary | ICD-10-CM | POA: Diagnosis not present

## 2018-08-22 DIAGNOSIS — R413 Other amnesia: Secondary | ICD-10-CM | POA: Insufficient documentation

## 2018-08-22 DIAGNOSIS — Z7951 Long term (current) use of inhaled steroids: Secondary | ICD-10-CM | POA: Insufficient documentation

## 2018-08-22 DIAGNOSIS — I959 Hypotension, unspecified: Secondary | ICD-10-CM | POA: Diagnosis not present

## 2018-08-22 DIAGNOSIS — D72829 Elevated white blood cell count, unspecified: Secondary | ICD-10-CM

## 2018-08-22 DIAGNOSIS — R0602 Shortness of breath: Secondary | ICD-10-CM | POA: Diagnosis not present

## 2018-08-22 DIAGNOSIS — H409 Unspecified glaucoma: Secondary | ICD-10-CM | POA: Insufficient documentation

## 2018-08-22 DIAGNOSIS — F329 Major depressive disorder, single episode, unspecified: Secondary | ICD-10-CM | POA: Diagnosis not present

## 2018-08-22 DIAGNOSIS — L8990 Pressure ulcer of unspecified site, unspecified stage: Secondary | ICD-10-CM | POA: Diagnosis present

## 2018-08-22 DIAGNOSIS — J45909 Unspecified asthma, uncomplicated: Secondary | ICD-10-CM | POA: Diagnosis not present

## 2018-08-22 DIAGNOSIS — J8410 Pulmonary fibrosis, unspecified: Secondary | ICD-10-CM | POA: Diagnosis present

## 2018-08-22 DIAGNOSIS — J479 Bronchiectasis, uncomplicated: Secondary | ICD-10-CM | POA: Insufficient documentation

## 2018-08-22 DIAGNOSIS — Z03818 Encounter for observation for suspected exposure to other biological agents ruled out: Secondary | ICD-10-CM | POA: Diagnosis not present

## 2018-08-22 DIAGNOSIS — Z8249 Family history of ischemic heart disease and other diseases of the circulatory system: Secondary | ICD-10-CM | POA: Insufficient documentation

## 2018-08-22 DIAGNOSIS — E860 Dehydration: Secondary | ICD-10-CM | POA: Insufficient documentation

## 2018-08-22 DIAGNOSIS — Z66 Do not resuscitate: Secondary | ICD-10-CM | POA: Diagnosis not present

## 2018-08-22 DIAGNOSIS — F419 Anxiety disorder, unspecified: Secondary | ICD-10-CM | POA: Insufficient documentation

## 2018-08-22 DIAGNOSIS — F32A Depression, unspecified: Secondary | ICD-10-CM | POA: Diagnosis present

## 2018-08-22 DIAGNOSIS — I2510 Atherosclerotic heart disease of native coronary artery without angina pectoris: Secondary | ICD-10-CM | POA: Insufficient documentation

## 2018-08-22 DIAGNOSIS — N19 Unspecified kidney failure: Secondary | ICD-10-CM | POA: Diagnosis present

## 2018-08-22 HISTORY — DX: Pulmonary fibrosis, unspecified: J84.10

## 2018-08-22 HISTORY — DX: Rheumatoid arthritis, unspecified: M06.9

## 2018-08-22 LAB — CBC WITH DIFFERENTIAL/PLATELET
Abs Immature Granulocytes: 0.21 10*3/uL — ABNORMAL HIGH (ref 0.00–0.07)
Basophils Absolute: 0.1 10*3/uL (ref 0.0–0.1)
Basophils Absolute: 0 10*3/uL (ref 0.0–0.1)
Basophils Relative: 0.5 % (ref 0.0–3.0)
Basophils Relative: 0 %
Eosinophils Absolute: 0.1 10*3/uL (ref 0.0–0.7)
Eosinophils Absolute: 0 10*3/uL (ref 0.0–0.5)
Eosinophils Relative: 0.4 % (ref 0.0–5.0)
Eosinophils Relative: 0 %
HCT: 36.8 % — ABNORMAL LOW (ref 39.0–52.0)
HCT: 39.4 % (ref 39.0–52.0)
Hemoglobin: 12.1 g/dL — ABNORMAL LOW (ref 13.0–17.0)
Hemoglobin: 12.6 g/dL — ABNORMAL LOW (ref 13.0–17.0)
Immature Granulocytes: 1 %
Lymphocytes Relative: 2.7 % — ABNORMAL LOW (ref 12.0–46.0)
Lymphocytes Relative: 5 %
Lymphs Abs: 0.6 10*3/uL — ABNORMAL LOW (ref 0.7–4.0)
Lymphs Abs: 0.8 10*3/uL (ref 0.7–4.0)
MCH: 29.2 pg (ref 26.0–34.0)
MCHC: 33 g/dL (ref 30.0–36.0)
MCHC: 32 g/dL (ref 30.0–36.0)
MCV: 88.2 fl (ref 78.0–100.0)
MCV: 91.2 fL (ref 80.0–100.0)
Monocytes Absolute: 0.1 10*3/uL (ref 0.1–1.0)
Monocytes Absolute: 0.2 10*3/uL (ref 0.1–1.0)
Monocytes Relative: 0.3 % — ABNORMAL LOW (ref 3.0–12.0)
Monocytes Relative: 1 %
Neutro Abs: 20.2 10*3/uL — ABNORMAL HIGH (ref 1.4–7.7)
Neutro Abs: 15.9 10*3/uL — ABNORMAL HIGH (ref 1.7–7.7)
Neutrophils Relative %: 96.1 % — ABNORMAL HIGH (ref 43.0–77.0)
Neutrophils Relative %: 93 %
Platelets: 483 10*3/uL — ABNORMAL HIGH (ref 150.0–400.0)
Platelets: 410 10*3/uL — ABNORMAL HIGH (ref 150–400)
RBC: 4.17 Mil/uL — ABNORMAL LOW (ref 4.22–5.81)
RBC: 4.32 MIL/uL (ref 4.22–5.81)
RDW: 17.8 % — ABNORMAL HIGH (ref 11.5–15.5)
RDW: 17.2 % — ABNORMAL HIGH (ref 11.5–15.5)
WBC: 21 10*3/uL (ref 4.0–10.5)
WBC: 17.2 10*3/uL — ABNORMAL HIGH (ref 4.0–10.5)
nRBC: 0 % (ref 0.0–0.2)

## 2018-08-22 LAB — COMPREHENSIVE METABOLIC PANEL
ALT: 44 U/L (ref 0–44)
AST: 22 U/L (ref 15–41)
Albumin: 3.1 g/dL — ABNORMAL LOW (ref 3.5–5.0)
Alkaline Phosphatase: 46 U/L (ref 38–126)
Anion gap: 12 (ref 5–15)
BUN: 31 mg/dL — ABNORMAL HIGH (ref 8–23)
CO2: 24 mmol/L (ref 22–32)
Calcium: 8.6 mg/dL — ABNORMAL LOW (ref 8.9–10.3)
Chloride: 100 mmol/L (ref 98–111)
Creatinine, Ser: 1.31 mg/dL — ABNORMAL HIGH (ref 0.61–1.24)
GFR calc Af Amer: >60 mL/min (ref >60)
GFR calc non Af Amer: 54 mL/min — ABNORMAL LOW (ref >60)
Glucose, Bld: 164 mg/dL — ABNORMAL HIGH (ref 70–99)
Potassium: 4.2 mmol/L (ref 3.5–5.1)
Sodium: 136 mmol/L (ref 135–145)
Total Bilirubin: 0.3 mg/dL (ref 0.3–1.2)
Total Protein: 6.8 g/dL (ref 6.5–8.1)

## 2018-08-22 LAB — BASIC METABOLIC PANEL
BUN: 28 mg/dL — ABNORMAL HIGH (ref 6–23)
CO2: 25 mEq/L (ref 19–32)
Calcium: 8.5 mg/dL (ref 8.4–10.5)
Chloride: 101 mEq/L (ref 96–112)
Creatinine, Ser: 1.23 mg/dL (ref 0.40–1.50)
GFR: 57.55 mL/min — ABNORMAL LOW (ref >60.00)
Glucose, Bld: 99 mg/dL (ref 70–99)
Potassium: 3.9 mEq/L (ref 3.5–5.1)
Sodium: 137 mEq/L (ref 135–145)

## 2018-08-22 LAB — TROPONIN I: Troponin I: <0.03 ng/mL (ref <0.03)

## 2018-08-22 LAB — LACTIC ACID, PLASMA: Lactic Acid, Venous: 2.1 mmol/L (ref 0.5–1.9)

## 2018-08-22 MED ORDER — VANCOMYCIN HCL IN DEXTROSE 1-5 GM/200ML-% IV SOLN
1000.0000 mg | Freq: Once | INTRAVENOUS | Status: AC
  Administered 2018-08-22: 1000 mg via INTRAVENOUS
  Filled 2018-08-22: qty 200

## 2018-08-22 MED ORDER — SODIUM CHLORIDE 0.9 % IV BOLUS
1000.0000 mL | Freq: Once | INTRAVENOUS | Status: AC
  Administered 2018-08-22: 1000 mL via INTRAVENOUS

## 2018-08-22 MED ORDER — CEFEPIME HCL 2 G IJ SOLR
INTRAMUSCULAR | Status: AC
  Filled 2018-08-22: qty 2

## 2018-08-22 MED ORDER — FENTANYL CITRATE (PF) 100 MCG/2ML IJ SOLN
25.0000 ug | Freq: Once | INTRAMUSCULAR | Status: AC
  Administered 2018-08-22: 25 ug via INTRAVENOUS
  Filled 2018-08-22: qty 2

## 2018-08-22 MED ORDER — ACETAMINOPHEN 325 MG PO TABS
650.0000 mg | ORAL_TABLET | Freq: Four times a day (QID) | ORAL | Status: DC | PRN
  Administered 2018-08-23: 650 mg via ORAL
  Filled 2018-08-22: qty 2

## 2018-08-22 MED ORDER — IOHEXOL 350 MG/ML SOLN
100.0000 mL | Freq: Once | INTRAVENOUS | Status: AC | PRN
  Administered 2018-08-22: 100 mL via INTRAVENOUS

## 2018-08-22 MED ORDER — ENOXAPARIN SODIUM 40 MG/0.4ML ~~LOC~~ SOLN
40.0000 mg | SUBCUTANEOUS | Status: DC
  Administered 2018-08-23: 40 mg via SUBCUTANEOUS
  Filled 2018-08-22: qty 0.4

## 2018-08-22 MED ORDER — SODIUM CHLORIDE 0.9 % IV SOLN
2.0000 g | Freq: Once | INTRAVENOUS | Status: AC
  Administered 2018-08-22: 2 g via INTRAVENOUS
  Filled 2018-08-22: qty 2

## 2018-08-22 MED ORDER — SODIUM CHLORIDE 0.9 % IV SOLN
INTRAVENOUS | Status: DC
  Administered 2018-08-23 (×2): via INTRAVENOUS

## 2018-08-22 MED ORDER — FENTANYL CITRATE (PF) 100 MCG/2ML IJ SOLN
25.0000 ug | Freq: Once | INTRAMUSCULAR | Status: AC
  Administered 2018-08-22: 19:00:00 25 ug via INTRAVENOUS
  Filled 2018-08-22: qty 2

## 2018-08-22 MED ORDER — SODIUM CHLORIDE 0.9 % IV SOLN
2.0000 g | Freq: Two times a day (BID) | INTRAVENOUS | Status: DC
  Filled 2018-08-22: qty 2

## 2018-08-22 MED ORDER — VANCOMYCIN HCL IN DEXTROSE 1-5 GM/200ML-% IV SOLN: 1000.0000 mg | INTRAVENOUS | Status: DC

## 2018-08-22 MED ORDER — HYDROCORTISONE NA SUCCINATE PF 100 MG IJ SOLR
100.0000 mg | Freq: Once | INTRAMUSCULAR | Status: AC
  Administered 2018-08-22: 100 mg via INTRAVENOUS
  Filled 2018-08-22: qty 2

## 2018-08-22 MED ORDER — ACETAMINOPHEN 650 MG RE SUPP: 650.0000 mg | Freq: Four times a day (QID) | RECTAL | Status: DC | PRN

## 2018-08-22 NOTE — Telephone Encounter (Signed)
[facility] Dy was calling to find out when pt would be scheduled for hfu. He does have a referral in Epic. Called patient to schedule appt. Pt does not have vmail set up. Will try again later. HFU Acute Resp Fx/LL pneumothorax COVID 08/08/18 neg in hospital.

## 2018-08-22 NOTE — Telephone Encounter (Signed)
Will await differential. Is down from last check 1 week ago at discharge but only slightly so.

## 2018-08-22 NOTE — Telephone Encounter (Signed)
Called and spoke with patient regarding hospital f/u appt Scheduled with MW for 08/29/2018 at 930am in office Pt requests to see MW as his cousin is a pt of MW Pt verbalized understanding, and nothing further needed.
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
## 2018-08-22 NOTE — ED Notes (Signed)
Per family pt has a bed sore on his bottom that he is getting treatment for it at home.

## 2018-08-22 NOTE — ED Notes (Signed)
Patient transported to CT 

## 2018-08-22 NOTE — ED Triage Notes (Signed)
Per pt and sister pt with Cp-was sent from PCP-pt with recent admn for resp failure with covid neg test x 2-d/c ~4 days ago-pt with labored breathing-on home O2 4 LNC
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
## 2018-08-22 NOTE — Progress Notes (Signed)
Pharmacy Antibiotic Note  Joel Valdez is a 73 y.o. male admitted on 08/22/2018 with pneumonia. Pharmacy has been consulted for vancomycin and cefepime dosing. Pt is afebrile but WBC is elevated at 17.2. Scr is also slightly above baseline and lactic acid is elevated.   Plan: Vancomycin 1gm IV Q24H Cefepime 2gm IV Q12H F/u renal fxn, C&S, clinical status and peak/trough at SS     Temp (24hrs), Avg:98.2 F (36.8 C), Min:98.2 F (36.8 C), Max:98.2 F (36.8 C)  Recent Labs  Lab 08/21/18 1427 08/22/18 1730  WBC 21.0 Repeated and verified X2.* 17.2*  CREATININE 1.23 1.31*  LATICACIDVEN  --  2.1*    Estimated Creatinine Clearance: 45.3 mL/min (A) (by C-G formula based on SCr of 1.31 mg/dL (H)).    Allergies  Allergen Reactions  . Methotrexate Derivatives Diarrhea and Other (See Comments)    Weakness; Severe bone pain; No Appetite Methotrexate with preservatives    Antimicrobials this admission: Vanc 4/29>> Cefepime 4/29>>  Dose adjustments this admission: N/A  Microbiology results: Pending  Thank you for allowing pharmacy to be a part of this patient's care.  Cleven Jansma, Rande Lawman 08/22/2018 5:54 PM
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
## 2018-08-22 NOTE — ED Notes (Signed)
Lactic acid 2.1; notified Dr Billy Fischer.

## 2018-08-22 NOTE — Telephone Encounter (Signed)
[facility] called with critical WBC of 21.0, they are making a slide now to get diff count.

## 2018-08-23 ENCOUNTER — Observation Stay (HOSPITAL_BASED_OUTPATIENT_CLINIC_OR_DEPARTMENT_OTHER): Payer: Medicare Other

## 2018-08-23 ENCOUNTER — Encounter (HOSPITAL_COMMUNITY): Payer: Self-pay | Admitting: Internal Medicine

## 2018-08-23 DIAGNOSIS — J8410 Pulmonary fibrosis, unspecified: Secondary | ICD-10-CM | POA: Diagnosis present

## 2018-08-23 DIAGNOSIS — R079 Chest pain, unspecified: Secondary | ICD-10-CM

## 2018-08-23 DIAGNOSIS — I959 Hypotension, unspecified: Secondary | ICD-10-CM | POA: Diagnosis not present

## 2018-08-23 DIAGNOSIS — R7989 Other specified abnormal findings of blood chemistry: Secondary | ICD-10-CM | POA: Diagnosis present

## 2018-08-23 DIAGNOSIS — R071 Chest pain on breathing: Secondary | ICD-10-CM | POA: Diagnosis not present

## 2018-08-23 DIAGNOSIS — J189 Pneumonia, unspecified organism: Secondary | ICD-10-CM | POA: Diagnosis not present

## 2018-08-23 DIAGNOSIS — L8990 Pressure ulcer of unspecified site, unspecified stage: Secondary | ICD-10-CM | POA: Diagnosis present

## 2018-08-23 DIAGNOSIS — D72829 Elevated white blood cell count, unspecified: Secondary | ICD-10-CM | POA: Diagnosis not present

## 2018-08-23 DIAGNOSIS — L89316 Pressure-induced deep tissue damage of right buttock: Secondary | ICD-10-CM | POA: Diagnosis not present

## 2018-08-23 DIAGNOSIS — N19 Unspecified kidney failure: Secondary | ICD-10-CM | POA: Diagnosis present

## 2018-08-23 LAB — BASIC METABOLIC PANEL
Anion gap: 6 (ref 5–15)
BUN: 22 mg/dL (ref 8–23)
CO2: 24 mmol/L (ref 22–32)
Calcium: 7.7 mg/dL — ABNORMAL LOW (ref 8.9–10.3)
Chloride: 107 mmol/L (ref 98–111)
Creatinine, Ser: 1.1 mg/dL (ref 0.61–1.24)
GFR calc Af Amer: >60 mL/min (ref >60)
GFR calc non Af Amer: >60 mL/min (ref >60)
Glucose, Bld: 162 mg/dL — ABNORMAL HIGH (ref 70–99)
Potassium: 3.7 mmol/L (ref 3.5–5.1)
Sodium: 137 mmol/L (ref 135–145)

## 2018-08-23 LAB — CBC WITH DIFFERENTIAL/PLATELET
Abs Immature Granulocytes: 0.2 10*3/uL — ABNORMAL HIGH (ref 0.00–0.07)
Basophils Absolute: 0 10*3/uL (ref 0.0–0.1)
Basophils Relative: 0 %
Eosinophils Absolute: 0 10*3/uL (ref 0.0–0.5)
Eosinophils Relative: 0 %
HCT: 30.6 % — ABNORMAL LOW (ref 39.0–52.0)
Hemoglobin: 9.7 g/dL — ABNORMAL LOW (ref 13.0–17.0)
Immature Granulocytes: 2 %
Lymphocytes Relative: 8 %
Lymphs Abs: 0.9 10*3/uL (ref 0.7–4.0)
MCH: 28.4 pg (ref 26.0–34.0)
MCHC: 31.7 g/dL (ref 30.0–36.0)
MCV: 89.5 fL (ref 80.0–100.0)
Monocytes Absolute: 0.5 10*3/uL (ref 0.1–1.0)
Monocytes Relative: 4 %
Neutro Abs: 9.9 10*3/uL — ABNORMAL HIGH (ref 1.7–7.7)
Neutrophils Relative %: 86 %
Platelets: 273 10*3/uL (ref 150–400)
RBC: 3.42 MIL/uL — ABNORMAL LOW (ref 4.22–5.81)
RDW: 16.6 % — ABNORMAL HIGH (ref 11.5–15.5)
WBC: 11.5 10*3/uL — ABNORMAL HIGH (ref 4.0–10.5)
nRBC: 0 % (ref 0.0–0.2)

## 2018-08-23 LAB — TROPONIN I
Troponin I: <0.03 ng/mL (ref <0.03)
Troponin I: <0.03 ng/mL (ref <0.03)

## 2018-08-23 LAB — MAGNESIUM
Magnesium: 2 mg/dL (ref 1.7–2.4)
Magnesium: 1.9 mg/dL (ref 1.7–2.4)

## 2018-08-23 LAB — ECHOCARDIOGRAM LIMITED
Height: 66 in
Weight: 2423.3 oz

## 2018-08-23 LAB — LIPASE, BLOOD: Lipase: 58 U/L — ABNORMAL HIGH (ref 11–51)

## 2018-08-23 MED ORDER — OXYCODONE HCL 5 MG PO TABS: 5.0000 mg | ORAL_TABLET | Freq: Three times a day (TID) | ORAL | 0 refills | Status: AC | PRN

## 2018-08-23 MED ORDER — AMLODIPINE BESYLATE 5 MG PO TABS: 5.0000 mg | ORAL_TABLET | Freq: Every day | ORAL | 11 refills | Status: DC

## 2018-08-23 MED ORDER — OXYCODONE HCL 5 MG PO TABS
5.0000 mg | ORAL_TABLET | Freq: Once | ORAL | Status: AC
  Administered 2018-08-23: 5 mg via ORAL
  Filled 2018-08-23: qty 1

## 2018-08-23 MED ORDER — FOLIC ACID 1 MG PO TABS
1.0000 mg | ORAL_TABLET | Freq: Every day | ORAL | Status: DC
  Administered 2018-08-23: 09:00:00 1 mg via ORAL
  Filled 2018-08-23: qty 1

## 2018-08-23 MED ORDER — PREDNISONE 20 MG PO TABS
20.0000 mg | ORAL_TABLET | Freq: Every day | ORAL | Status: AC
  Administered 2018-08-23: 20 mg via ORAL
  Filled 2018-08-23: qty 1

## 2018-08-23 MED ORDER — PREDNISONE 10 MG PO TABS: 10.0000 mg | ORAL_TABLET | Freq: Every day | ORAL | 0 refills | Status: DC

## 2018-08-23 MED ORDER — ESCITALOPRAM OXALATE 10 MG PO TABS
10.0000 mg | ORAL_TABLET | Freq: Every day | ORAL | Status: DC
  Administered 2018-08-23: 10 mg via ORAL
  Filled 2018-08-23: qty 1

## 2018-08-23 MED ORDER — ASPIRIN 325 MG PO TABS
325.0000 mg | ORAL_TABLET | Freq: Once | ORAL | Status: AC
  Administered 2018-08-23: 02:00:00 325 mg via ORAL
  Filled 2018-08-23: qty 1

## 2018-08-23 MED ORDER — PREDNISONE 10 MG PO TABS: 10.0000 mg | ORAL_TABLET | Freq: Every day | ORAL | 0 refills | Status: AC

## 2018-08-23 MED ORDER — PREDNISONE 10 MG PO TABS: 10.0000 mg | ORAL_TABLET | Freq: Every day | ORAL | Status: DC

## 2018-08-23 MED ORDER — ATORVASTATIN CALCIUM 80 MG PO TABS
80.0000 mg | ORAL_TABLET | Freq: Once | ORAL | Status: AC
  Administered 2018-08-23: 80 mg via ORAL
  Filled 2018-08-23: qty 1

## 2018-08-23 MED ORDER — ALBUTEROL SULFATE HFA 108 (90 BASE) MCG/ACT IN AERS
2.0000 | INHALATION_SPRAY | Freq: Four times a day (QID) | RESPIRATORY_TRACT | Status: DC | PRN
  Filled 2018-08-23: qty 6.7

## 2018-08-23 MED ORDER — PREDNISONE 1 MG PO TABS: 5.0000 mg | ORAL_TABLET | Freq: Every day | ORAL | 1 refills | Status: DC

## 2018-08-23 MED ORDER — KETOROLAC TROMETHAMINE 15 MG/ML IJ SOLN
15.0000 mg | Freq: Once | INTRAMUSCULAR | Status: AC
  Administered 2018-08-23: 15 mg via INTRAVENOUS
  Filled 2018-08-23: qty 1

## 2018-08-23 MED ORDER — LIDOCAINE 5 % EX PTCH
1.0000 | MEDICATED_PATCH | CUTANEOUS | Status: DC
  Administered 2018-08-23: 1 via TRANSDERMAL
  Filled 2018-08-23: qty 1

## 2018-08-23 MED ORDER — LIDOCAINE 5 % EX PTCH: 1.0000 | MEDICATED_PATCH | CUTANEOUS | 0 refills | Status: DC

## 2018-08-23 NOTE — Discharge Summary (Addendum)
Physician Discharge Summary  Joel Valdez FGH:[id number] DOB: 1945/11/28 DOA: 08/22/2018  PCP: [name], PA-C  Admit date: 08/22/2018 Discharge date: 08/23/2018  Time spent: 45 minutes  Recommendations for Outpatient Follow-up:  1. Follow up with PCP 1 week for evaluation of symptoms. Recommend bmet to track kidney function and BP control 2. Home health PT and RN  Discharge Diagnoses:  Principal Problem:   Chest pain Active Problems:   Acute prerenal azotemia   Essential hypertension, benign   Interstitial pulmonary fibrosis (HCC)   Pressure injury of skin   Depression   Rheumatoid arthritis involving both [facility])   Discharge Condition: stable  Diet recommendation: heart healthy  Filed Weights   08/22/18 1818 08/22/18 2319 08/23/18 0700  Weight: 71.2 kg 67.7 kg 68.7 kg    History of present illness:  Joel Valdez is a 73 y.o. male with medical history significant for hypertension, hyperlipidemia, chronic interstitial pulmonary fibrosis, asbestosis, rheumatoid arthritis on chronic prednisone therapy/methotrexate, who was admitted to [facility] on 08/22/2018 as transfer from [place] emergency department for further evaluation of chest pain. Of note patient was recently hospitalized from 07/30/18 to 08/15/18 for acute hypoxic respiratory failure in the setting of bilateral, multifocal pneumonia.  Over the course, he underwent COVID-19 testing x2, both of which were found to be negative.  Hospital course was complicated by a small left-sided pneumothorax treated with chest tube, which was removed prior to discharge.  In the setting of new residual supplemental oxygen demands, the patient was ultimately discharged on 4 L oxygen via nasal cannula.  In terms of notable discharge medications, he was discharged on a prednisone taper relative to his chronic 5 mg daily.  Additionally, he was sent out on Lasix 20 mg p.o. daily for 30 days. No additional  antibiotics at the time of discharge.  Following his discharge from the hospital on 08/14/2020, the patient reported that the shortness of breath and non-productive cough that he was experiencing during his previous hospitalization had continued to improve, but had not yet returned at baseline  Hospital Course:  #) Atypical chest pain: likely musculoskeletal.  The patient presented with 2 to 3 days of intermittent left-sided chest discomfort that worsened with exertion, improved with rest, but also reproducible with palpation of the anterior left chest wall, was pleuritic, and somewhat positional, worsening when leaning forward.  Troponin x3-.  EKG normal sinus rhythm without evidence of acute ischemic changes relative to EKG performed on 08/03/2018.  CTA of the chest  showed no evidence of acute process, including no evidence of pulmonary embolism, pneumothorax, edema, aneurysm, and showed improving bilateral airspace opacities in the setting of a recent hospitalization for bilateral pneumonia.  Patient was chest pain-free at time of admission. Echo with EF greater than 65%   #) History of bilateral pneumonia: Recent hospitalization from 07/29/2020 to 08/15/2018. CT of the chest demonstrated improving bilateral airspace opacities. He remained afebrile, and non-toxic appearing.   #) Prerenal azotemia: resolved at discharge. Likely related to recent initiation of Lasix 20 mg p.o. daily at the time of recent hospital discharge, the patient presented with prerenal azotemia and the clinical appearance of dehydration.  Serum creatinine 1.31, which is slightly higher than most recent prior value of 1.23.  Further evidence of element of dehydration and stems from increasing blood pressure following IV fluids administered in med center [facility] emergency department.   #) History of essential hypertension: BP low end of normal. Home  meds include amlodipine, lisinopril, and recently introduced Lasix.  Blood  pressure at the time of initial presentation at [facility] emergency department was noted to be 91/57, with subsequent improvement to 109/64 following interval IV fluids. Will stop lasix given dehydration upon presentation. Resume amlodipine at a lower dose and will stop lisinopril.  #) Chronic interstitial pulmonary fibrosis: In setting of asbestosis.He was discharged from recent hospitalization on supplemental oxygen in the setting of improving bilateral pneumonia. Additionally, at time of hospital discharge, the patient was sent out on a 12-day prednisone taper.  Per discharge instructions, he is to complete 3 more days of 10 mg of prednisone before resuming his chronic 5 mg p.o. daily   #) Rheumatoid arthritis: stable at baseline.  On chronic prednisone therapy, with baseline dose of 5 mg p.o. daily.  He is on q. weekly methotrexate injections.   #) Depression:  Stable at baseline..           Procedures: Echo Left Ventricle: The left ventricle has hyperdynamic systolic function, with an ejection fraction of >65%. The cavity size was normal. There is no increase in left ventricular wall thickness. Left ventricular diastolic Doppler parameters are consistent  with pseudonormalization.  Right Ventricle: The right ventricle has mildly reduced systolic function. The cavity was mildly enlarged. There is no increase in right ventricular wall thickness. Right ventricular systolic pressure is moderately elevated with an estimated pressure of   51.7 mmHg.  Consultations:    Discharge Exam: Vitals:   08/22/18 2319 08/23/18 0700  BP: (!) 98/58 104/68  Pulse: 61 (!) 55  Resp: (!) 30 15  Temp: 97.7 F (36.5 C) 97.7 F (36.5 C)  SpO2: 100% 99%    General: awake alert sitting up in bed watching TV. No acute distress Cardiovascular: RRR no mgr no LE edema. Left anterior chest tender to palpation Respiratory: normal effort BS without wheeze or crackles  Discharge  Instructions   Discharge Instructions    Call MD for:  difficulty breathing, headache or visual disturbances   Complete by:  As directed    Call MD for:  persistant dizziness or light-headedness   Complete by:  As directed    Call MD for:  temperature >100.4   Complete by:  As directed    Diet - low sodium heart healthy   Complete by:  As directed    Discharge instructions   Complete by:  As directed    Take medications as prescribed Follow up with PCP 1-2 weeks for evaluation of symptoms   Increase activity slowly   Complete by:  As directed      Allergies as of 08/23/2018      Reactions   Methotrexate Derivatives Diarrhea, Other (See Comments)   Weakness; Severe bone pain; No Appetite Methotrexate with preservatives      Medication List    STOP taking these medications   furosemide 20 MG tablet Commonly known as:  Lasix   lisinopril 10 MG tablet Commonly known as:  ZESTRIL     TAKE these medications   acetaminophen 325 MG tablet Commonly known as:  TYLENOL Take 650 mg by mouth daily as needed for moderate pain.   albuterol 108 (90 Base) MCG/ACT inhaler Commonly known as:  VENTOLIN HFA Inhale 2 puffs into the lungs every 6 (six) hours as needed for wheezing or shortness of breath.   amLODipine 5 MG tablet Commonly known as:  NORVASC Take 1 tablet (5 mg total) by mouth daily. What  changed:    medication strength  how much to take   atorvastatin 10 MG tablet Commonly known as:  LIPITOR Take 1 tablet (10 mg total) by mouth at bedtime.   B-D TB SYRINGE 1CC/26GX3/8" 26G X 3/8" 1 ML Misc Generic drug:  TUBERCULIN SYR 1CC/26GX3/8" USE TO INJECT methotrexate ONCE every WEEK AS DIRECTED   escitalopram 20 MG tablet Commonly known as:  LEXAPRO Take 0.5 tablets (10 mg total) by mouth daily.   FISH OIL PO Take by mouth.   folic acid 1 MG tablet Commonly known as:  FOLVITE Take 1 tablet by mouth daily.   guaiFENesin 600 MG 12 hr tablet Commonly known as:   MUCINEX Take 2 tablets (1,200 mg total) by mouth 2 (two) times daily as needed.   leuprolide 30 MG injection Commonly known as:  LUPRON Inject 30 mg into the muscle every 3 (three) months.   methotrexate 50 MG/2ML injection Inject 25 mg into the vein once a week.   multivitamin with minerals Tabs tablet Take 1 tablet by mouth daily.   OLIVE LEAF PO Take 1 tablet by mouth daily.   predniSONE 10 MG tablet Commonly known as:  DELTASONE Take 1 tablet (10 mg total) by mouth daily with breakfast for 3 days. Start taking on:  Aug 24, 2018 What changed:    medication strength  how much to take  when to take this  additional instructions   predniSONE 1 MG tablet Commonly known as:  DELTASONE Take 5 tablets (5 mg total) by mouth daily with breakfast. Start taking on:  Aug 27, 2018 What changed:    medication strength  how much to take  how to take this  when to take this  additional instructions  These instructions start on Aug 27, 2018. If you are unsure what to do until then, ask your doctor or other care provider.   UNABLE TO FIND Take 500 mg by mouth daily. GRAVIOLA   UNABLE TO FIND Take 1 tablet by mouth every other day. *INTESTINAL SOOTHE AND BUILD   VITAMIN B-COMPLEX PO Take 1 tablet by mouth daily.   Vitamin D3 125 MCG (5000 UT) Tabs Take 1-2 tablets by mouth daily.      Allergies  Allergen Reactions  . Methotrexate Derivatives Diarrhea and Other (See Comments)    Weakness; Severe bone pain; No Appetite Methotrexate with preservatives      The results of significant diagnostics from this hospitalization (including imaging, microbiology, ancillary and laboratory) are listed below for reference.    Significant Diagnostic Studies: Dg Chest 2 View  Result Date: 08/21/2018 CLINICAL DATA:  Chest pain. EXAM: CHEST - 2 VIEW COMPARISON:  CT scan of August 09, 2018. Radiograph of August 06, 2018. FINDINGS: Stable cardiomediastinal silhouette. Atherosclerosis  of thoracic aorta is noted. No pneumothorax or pleural effusion is noted. Stable coarse reticular interstitial densities are noted throughout both lungs most consistent with scarring or fibrosis. No acute abnormality is noted. Bony thorax is unremarkable. IMPRESSION: Stable coarse reticular densities are noted throughout both lungs most consistent with scarring or fibrosis. No acute abnormality is noted. Aortic Atherosclerosis (ICD10-I70.0). Electronically Signed   By: Marijo Conception M.D.   On: 08/21/2018 15:12   Ct Angio Chest Pe W And/or Wo Contrast  Result Date: 08/22/2018 CLINICAL DATA:  Chest pain and shortness of breath. Elevated D-dimer. High clinical probability for pulmonary embolism. EXAM: CT ANGIOGRAPHY CHEST WITH CONTRAST TECHNIQUE: Multidetector CT imaging of the chest was performed using the standard  protocol during bolus administration of intravenous contrast. Multiplanar CT image reconstructions and MIPs were obtained to evaluate the vascular anatomy. CONTRAST:  129mL OMNIPAQUE IOHEXOL 350 MG/ML SOLN COMPARISON:  08/09/2018 from [place]: Cardiovascular: Satisfactory opacification of pulmonary arteries noted, and no pulmonary emboli identified. No evidence of thoracic aortic dissection or aneurysm. Mild cardiomegaly. Aortic and coronary artery atherosclerosis. Mediastinum/Nodes: No masses or pathologically enlarged lymph nodes identified. Lungs/Pleura: Severe chronic pulmonary interstitial fibrosis again seen with honeycombing and traction bronchiectasis which is most severe in the left upper lobe. Previously seen areas of parenchymal consolidation in the right mid and lower lung are decreased since previous study, consistent with resolving superimposed infectious or inflammatory process. No new or worsening areas of pulmonary opacity are identified. No evidence of pneumothorax or pleural effusion. Upper abdomen: No acute findings. Musculoskeletal: No suspicious bone lesions  identified. Review of the MIP images confirms the above findings. IMPRESSION: 1. No evidence of pulmonary embolism or other acute findings. 2. Severe chronic pulmonary interstitial fibrosis. 3. Resolving right mid and lower lung airspace opacity, consistent with resolving superimposed infectious or inflammatory process. 4. Aortic and coronary artery atherosclerosis. Electronically Signed   By: Earle Gell M.D.   On: 08/22/2018 20:20   Ct Angio Chest Pe W Or Wo Contrast  Result Date: 08/09/2018 CLINICAL DATA:  Persistent hypoxia, negative COVID EXAM: CT ANGIOGRAPHY CHEST WITH CONTRAST TECHNIQUE: Multidetector CT imaging of the chest was performed using the standard protocol during bolus administration of intravenous contrast. Multiplanar CT image reconstructions and MIPs were obtained to evaluate the vascular anatomy. CONTRAST:  140mL OMNIPAQUE IOHEXOL 350 MG/ML SOLN COMPARISON:  Chest radiographs, 08/06/2018 FINDINGS: Cardiovascular: Satisfactory opacification of the pulmonary arteries to the segmental level. No evidence of pulmonary embolism. Normal heart size. Scattered coronary artery calcifications. Aortic atherosclerosis. No pericardial effusion. Mediastinum/Nodes: No enlarged mediastinal, hilar, or axillary lymph nodes. Thyroid gland, trachea, and esophagus demonstrate no significant findings. Lungs/Pleura: Moderate to severe pulmonary fibrosis in a pattern featuring mild traction bronchiectasis and multiple areas of bronchiolectasis and possible honeycombing, for example in left upper lobe. There is no significant apical to basal gradient. Moderate underlying emphysema. There are areas of ground-glass and consolidative airspace opacity in the right greater than left lung bases. There are areas of dense consolidation about the fissures (e.g. series 6, image 72). No pleural effusion or pneumothorax. Upper Abdomen: No acute abnormality. Musculoskeletal: No chest wall abnormality. No acute or significant  osseous findings. Review of the MIP images confirms the above findings. IMPRESSION: 1.  Negative examination for pulmonary embolism. 2. Moderate to severe pulmonary fibrosis in a pattern featuring mild traction bronchiectasis and multiple areas of bronchiolectasis and possible honeycombing, for example in left upper lobe. There is no significant apical to basal gradient. Moderate underlying emphysema. 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 There are areas of dense consolidation about the fissures (e.g. series 6, image 72), which are likely related to chronic scarring. Recommend follow-up CT for these areas in 3 months to exclude underlying mass. 3. There are areas of ground-glass and consolidative airspace opacity in the right greater than left lung bases, consistent with multifocal infection. 4.  Coronary artery disease and aortic atherosclerosis. Electronically Signed   By: Eddie Candle M.D.   On: 08/09/2018 10:56   Dg Chest Port 1 View  Result Date: 08/06/2018 CLINICAL DATA:  Evaluate pneumothorax.  COVID-19 pneumonia. EXAM: PORTABLE CHEST 1 VIEW COMPARISON:  08/04/2018 FINDINGS: Findings right internal  jugular line tip at low SVC. Removal of left chest tube, without pneumothorax. Mild cardiomegaly. Atherosclerosis in the transverse aorta. No pleural fluid. Left greater than right and basilar predominant interstitial opacities are similar, given differences in technique. IMPRESSION: No significant change in left greater than right interstitial opacities, consistent with pneumonia. Removal of left chest tube, without pneumothorax or pleural fluid. Aortic Atherosclerosis (ICD10-I70.0). Electronically Signed   By: Abigail Miyamoto M.D.   On: 08/06/2018 08:51   Dg Chest Port 1 View  Result Date: 08/04/2018 CLINICAL DATA:  Respiratory failure EXAM: PORTABLE CHEST 1 VIEW COMPARISON:  08/04/2018 FINDINGS: Left chest tube is stable. Left jugular central venous  catheter is stable with its tip at the cavoatrial junction. Extensive bilateral airspace disease is not significantly changed. No pneumothorax. IMPRESSION: Stable extensive bilateral airspace disease. Stable left chest tube without pneumothorax. Electronically Signed   By: Marybelle Killings M.D.   On: 08/04/2018 11:07   Dg Chest Port 1 View  Result Date: 08/04/2018 CLINICAL DATA:  Respiratory failure EXAM: PORTABLE CHEST 1 VIEW COMPARISON:  08/03/2018 FINDINGS: Pigtail chest tube on the left unchanged. No pneumothorax. Left jugular central venous catheter tip SVC/RA junction unchanged. Diffuse bilateral airspace disease left greater than right appears unchanged. Small left effusion unchanged. Underlying COPD. IMPRESSION: Left chest tube in place no pneumothorax Diffuse bilateral airspace disease left greater than right unchanged. Electronically Signed   By: Franchot Gallo M.D.   On: 08/04/2018 07:56   Dg Chest Port 1 View  Result Date: 08/03/2018 CLINICAL DATA:  Status post extubation today. EXAM: PORTABLE CHEST 1 VIEW COMPARISON:  Single-view of the chest 08/01/2018. FINDINGS: ETT and NG tube been removed. Left IJ catheter is unchanged. Left chest tube has backed out slightly. Lung volumes are low. Coarsening of the pulmonary interstitium is worse on the left unchanged no pneumothorax or pleural effusion. Heart size is normal. Aortic atherosclerosis is noted. IMPRESSION: Status post extubation and NG tube removal. The patient's left chest tube has backed out slightly. No pneumothorax. Coarsening of the interstitial is much worse on the left and unchanged. Electronically Signed   By: Inge Rise M.D.   On: 08/03/2018 12:53   Dg Chest Port 1 View  Result Date: 08/01/2018 CLINICAL DATA:  Pneumothorax. Chest tube placement. EXAM: PORTABLE CHEST 1 VIEW COMPARISON:  Earlier this day at 1749 hour, radiograph 07/30/2018 FINDINGS: Placement of left pigtail catheter with tip near the apex. Left pneumothorax is  significantly decreased in size and near completely resolved. Possible tiny lucency remains at the left lung apex and laterally. Endotracheal tube, enteric tube, and left central line remain in place. Diffuse bilateral interstitial coarsening and patchy opacities throughout the left lung. IMPRESSION: Placement of left pigtail catheter with significantly decreased size of left pneumothorax. Possible tiny residual remains at the left lung apex and laterally. Diffuse bilateral interstitial opacities patchy opacities throughout the left lung, with some improvement from 07/30/2018 radiograph. Electronically Signed   By: Keith Rake M.D.   On: 08/01/2018 20:36   Dg Chest Port 1 View  Result Date: 08/01/2018 CLINICAL DATA:  Endotracheal tube placement. Central line placement. EXAM: PORTABLE CHEST 1 VIEW COMPARISON:  07/30/2018 FINDINGS: 1749 hours. Endotracheal tube tip is 3.7 cm above the base of the carina. The NG tube passes into the stomach although the distal tip position is not included on the film. Left IJ central line tip overlies the mid to distal SVC level. Underlying diffuse interstitial opacity again noted with apparent improvement in airspace disease  and better lung aeration. Patient is noted to have a left pneumothorax, of moderate to large size. Telemetry leads overlie the chest. IMPRESSION: Moderate to large left pneumothorax. Diffuse underlying interstitial opacity in both lungs with some improvement in the airspace disease seen previously. Endotracheal tube, left IJ central line, and NG tube are new in the interval. I called the results of this study to the charge nurse, Zoe, and the clinical team was already aware of the pneumothorax she reported that a left chest tube has already been placed. Electronically Signed   By: Misty Stanley M.D.   On: 08/01/2018 19:10   Dg Chest Port 1 View  Result Date: 07/30/2018 CLINICAL DATA:  Sob and cough x 1 week. No fever at this time. EXAM: PORTABLE CHEST 1  VIEW COMPARISON:  01/09/2014 FINDINGS: There are bilateral interstitial and airspace opacities, with airspace opacity most prominent in the peripheral left lung. There are additional chronic areas of scarring in both upper lobes stable from the prior exam. No convincing pleural effusion.  No pneumothorax. Cardiac silhouette is normal in size. No mediastinal or hilar masses. Skeletal structures are grossly intact. IMPRESSION: 1. Bilateral, left greater than right, interstitial and airspace lung opacities consistent with multifocal pneumonia. Electronically Signed   By: Lajean Manes M.D.   On: 07/30/2018 12:55    Microbiology: Recent Results (from the past 240 hour(s))  Blood Culture (routine x 2)     Status: None (Preliminary result)   Collection Time: 08/22/18  5:30 PM  Result Value Ref Range Status   Specimen Description   Final    BLOOD RIGHT ANTECUBITAL Performed at [facility], [address] [place], [place] [address]    Special Requests   Final    BOTTLES DRAWN AEROBIC AND ANAEROBIC Blood Culture adequate volume Performed at [facility], [address] [place], [place] [address]    Culture   Final    NO GROWTH < 12 HOURS Performed at [facility], [place] [address] [place], [place] [address]    Report Status PENDING  Incomplete  Blood Culture (routine x 2)     Status: None (Preliminary result)   Collection Time: 08/22/18  6:00 PM  Result Value Ref Range Status   Specimen Description   Final    BLOOD BLOOD RIGHT FOREARM Performed at [facility], [address] [place], [place] [address]    Special Requests   Final    BOTTLES DRAWN AEROBIC AND ANAEROBIC Blood Culture adequate volume Performed at [facility], [address] [place], [place] [address]    Culture   Final    NO GROWTH < 12 HOURS Performed at [facility], [place] [address], [place], [place] [address]    Report Status PENDING  Incomplete      Labs: Basic Metabolic Panel: Recent Labs  Lab 08/21/18 1427 08/22/18 1730 08/23/18 0020 08/23/18 0332  NA 137 136  --  137  K 3.9 4.2  --  3.7  CL 101 100  --  107  CO2 25 24  --  24  GLUCOSE 99 164*  --  162*  BUN 28* 31*  --  22  CREATININE 1.23 1.31*  --  1.10  CALCIUM 8.5 8.6*  --  7.7*  MG  --   --  2.0 1.9   Liver Function Tests: Recent Labs  Lab 08/22/18 1730  AST 22  ALT 44  ALKPHOS 46  BILITOT 0.3  PROT 6.8  ALBUMIN 3.1*   Recent Labs  Lab 08/23/18 0332  LIPASE 58*   No results for input(s): AMMONIA in the last 168 hours. CBC: Recent Labs  Lab 08/21/18 1427 08/22/18 1730 08/23/18 0332  WBC 21.0 Repeated and verified X2.* 17.2* 11.5*  NEUTROABS 20.2* 15.9* 9.9*  HGB 12.1* 12.6* 9.7*  HCT 36.8* 39.4 30.6*  MCV 88.2 91.2 89.5  PLT 483.0* 410* 273   Cardiac Enzymes: Recent Labs  Lab 08/22/18 1730 08/23/18 0020 08/23/18 0332  TROPONINI <0.03 <0.03 <0.03   BNP: BNP (last 3 results) Recent Labs    07/30/18 1221  BNP 94.0    ProBNP (last 3 results) No results for input(s): PROBNP in the last 8760 hours.  CBG: No results for input(s): GLUCAP in the last 168 hours.     Signed:  Radene Gunning NP [facility] 08/23/2018, 2:57 PM

## 2018-08-23 NOTE — TOC Transition Note (Signed)
Transition of Care [facility]) - CM/SW Discharge Note   Patient Details  Name: Joel Valdez MRN: [id number] Date of Birth: 1945/12/28  [facility]) CM/SW Contact:  Bethena Roys, RN Phone Number: 08/23/2018, 3:44 PM   Clinical Narrative:  PT recommendations for [facility] PT- Patient was previously active with [facility] RN/PT services with [facility]. CM did speak with wife and she wanted [facility] then she changed her mind and wanted to keep [name]. Tommi Rumps is aware that patient will d/c home today. Patient's sister to pick up patient today. Sister will have 02 available in the car for transport. No further needs from CM at this time.     Final next level of care: [place] Barriers to Discharge: No Barriers Identified   Patient Goals and CMS Choice Patient states their goals for this hospitalization and ongoing recovery are:: ("to feel better") CMS Medicare.gov Compare Post Acute Care list provided to:: (patients wife [facility]- now wants [facility]. ) Choice offered to / list presented to : Spouse(Medicare. gov list not needed. )   Discharge Plan and Services In-house Referral: NA Discharge Planning Services: CM Consult Post Acute Care Choice: Home Health          DME Arranged: N/A DME Agency: NA       HH Arranged: RN, Disease Management, PT [place] Agency: [place] Date [facility] Agency Contacted: 08/23/18 Time [place]: [phone] Representative spoke with at [place]: (cory)  Social Determinants of Health (SDOH) Interventions     Readmission Risk Interventions No flowsheet data found.

## 2018-08-23 NOTE — Consult Note (Addendum)
WOC consult requested for buttock. Reviewed nursing flowsheet; this is a stage 2 pressure injury to the right buttock, present on admission, which is described as dry and scabbed. Foam dressing to protect and promote healing. Please re-consult if further assistance is needed.  Thank-you,  Julien Girt MSN, [place], [place], [place], [place]

## 2018-08-23 NOTE — Progress Notes (Signed)
  Echocardiogram 2D Echocardiogram has been performed.  Johny Chess 08/23/2018, 10:46 AM

## 2018-08-23 NOTE — Evaluation (Signed)
Occupational Therapy Evaluation Patient Details Name: Joel Valdez. [place] MRN: [id number] DOB: [birth year] Today's Date: 08/23/2018    History of Present Illness Pt is a 73 y.o. M with significant PMH of hypertension, chronic interstitial pulmonary fibrosis, rheumatoid arthritis, who was admitted for further evaluation of chest pain. Of note, pt recently hospitalized for acute hypoxic respiratory failure in setting of bilateral, multifocal pneumonia.    Clinical Impression   Patient evaluated by Occupational Therapy with no further acute OT needs identified. All education has been completed and the patient has no further questions. Pt is able to perform ADLs with supervision.  02 sats >89% on 3L supplemental 02.  Reviewed energy conservation techniques. See below for any follow-up Occupational Therapy or equipment needs. OT is signing off. Thank you for this referral.      Follow Up Recommendations  No OT follow up;Supervision/Assistance - 24 hour    Equipment Recommendations  None recommended by OT    Recommendations for Other Services       Precautions / Restrictions Precautions Precautions: Fall Precaution Comments: monitor sats  Restrictions Weight Bearing Restrictions: No      Mobility Bed Mobility Overal bed mobility: Modified Independent                Transfers Overall transfer level: Needs assistance Equipment used: None Transfers: Sit to/from Stand;Stand Pivot Transfers Sit to Stand: Supervision Stand pivot transfers: Supervision            Balance Overall balance assessment: Needs assistance   Sitting balance-Leahy Scale: Good     Standing balance support: During functional activity Standing balance-Leahy Scale: Fair                             ADL either performed or assessed with clinical judgement   ADL Overall ADL's : Needs assistance/impaired Eating/Feeding: Independent   Grooming: Wash/dry hands;Wash/dry face;Oral  care;Brushing hair;Standing;Supervision/safety   Upper Body Bathing: Set up;Sitting   Lower Body Bathing: Sit to/from stand;Supervison/ safety   Upper Body Dressing : Set up;Sitting   Lower Body Dressing: Supervision/safety;Sit to/from stand   Toilet Transfer: Supervision/safety;Ambulation;Comfort height toilet;Grab bars   Toileting- Clothing Manipulation and Hygiene: Supervision/safety;Sit to/from stand   Tub/ Shower Transfer: Tub transfer;Supervision/safety;Shower Scientist, research (medical) Details (indicate cue type and reason): simulated stepping into tub with UE support    General ADL Comments: Discussed energy conservation strategies with him.  He is able to complete ADLs with 02 sats >89% on 3L supplemental 02.       Vision Patient Visual Report: No change from baseline       Perception     Praxis      Pertinent Vitals/Pain Pain Assessment: Faces Faces Pain Scale: Hurts a little bit Pain Location: shoulder Pain Descriptors / Indicators: Aching Pain Intervention(s): Monitored during session     Hand Dominance     Extremity/Trunk Assessment Upper Extremity Assessment Upper Extremity Assessment: Overall WFL for tasks assessed   Lower Extremity Assessment Lower Extremity Assessment: Overall WFL for tasks assessed   Cervical / Trunk Assessment Cervical / Trunk Assessment: Normal   Communication Communication Communication: HOH   Cognition Arousal/Alertness: Awake/alert Behavior During Therapy: WFL for tasks assessed/performed Overall Cognitive Status: Within Functional Limits for tasks assessed  General Comments       Exercises     Shoulder Instructions      Home Living Family/patient expects to be discharged to:: Private residence Living Arrangements: Spouse/significant other;Other relatives Available Help at Discharge: Family Type of Home: House Home Access: Stairs to enter [employer]  of Steps: 3   Home Layout: One level     Bathroom Shower/Tub: Teacher, early years/pre: Handicapped height     Home Equipment: Grab bars - tub/shower;Walker - 2 wheels;Cane - single point;Shower seat   Additional Comments: Lives with wife who has COPD, and her cousin who is on continuous 02      Prior Functioning/Environment Level of Independence: Independent        Comments: 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         OT Problem List: Decreased activity tolerance;Cardiopulmonary status limiting activity      OT Treatment/Interventions: Self-care/ADL training;Energy conservation;Therapeutic activities    OT Goals(Current goals can be found in the care plan section) Acute Rehab OT Goals Patient Stated Goal: to go home, and to feel better   OT Frequency:     Barriers to D/C:            Co-evaluation              AM-PAC OT "6 Clicks" Daily Activity     Outcome Measure Help from another person eating meals?: None Help from another person taking care of personal grooming?: None Help from another person toileting, which includes using toliet, bedpan, or urinal?: None Help from another person bathing (including washing, rinsing, drying)?: None Help from another person to put on and taking off regular upper body clothing?: None Help from another person to put on and taking off regular lower body clothing?: None 6 Click Score: 24   End of Session    Activity Tolerance:   Patient left:    OT Visit Diagnosis: Muscle weakness (generalized) (M62.81)                Time: 8366-2947 OT Time Calculation (min): 20 min Charges:  OT General Charges $OT Visit: 1 Visit OT Evaluation $OT Eval Moderate Complexity: 1 Mod  Lucille Passy, OTR/L Acute Rehabilitation Services Pager [phone] Office [phone]   Lucille Passy M 08/23/2018, 4:07 PM

## 2018-08-23 NOTE — Care Management CC44 (Signed)
Condition Code 44 Documentation Completed  Patient Details  Name: Joel Valdez MRN: [id number] Date of Birth: 1945/05/03   Condition Code 44 given:  Yes Patient signature on Condition Code 44 notice:  Yes Documentation of 2 MD's agreement:  Yes Code 44 added to claim:  Yes    Bethena Roys, RN 08/23/2018, 1:55 PM

## 2018-08-23 NOTE — Care Management Obs Status (Signed)
[facility] NOTIFICATION   Patient Details  Name: Joel Valdez MRN: [id number] Date of Birth: 07-04-1945   Medicare Observation Status Notification Given:  Yes    Bethena Roys, RN 08/23/2018, 1:55 PM

## 2018-08-23 NOTE — Discharge Instructions (Signed)

## 2018-08-23 NOTE — Evaluation (Signed)
Physical Therapy Evaluation Patient Details Name: Joel Valdez. [place] MRN: [id number] DOB: 02/21/1946 Today's Date: 08/23/2018   History of Present Illness  Pt is a 73 y.o. M with significant PMH of hypertension, chronic interstitial pulmonary fibrosis, rheumatoid arthritis, who was admitted for further evaluation of chest pain. Of note, pt recently hospitalized for acute hypoxic respiratory failure in setting of bilateral, multifocal pneumonia.   Clinical Impression  Pt admitted with above diagnosis. Pt currently with functional limitations due to the deficits listed below (see PT Problem List). Pt presents with decreased functional mobility secondary to decreased endurance and deconditioning. Pt ambulating 160 feet with min guard assist. Desaturation to 82% towards end of walk, increased to 6L O2. Rebounded back to > 90% with seated rest break and cues for pursed lip breathing and returned to 3L O2. Education re: endurance conservation, home set up, bumping oxygen up with mobility. Pt will benefit from skilled PT to increase their independence and safety with mobility to allow discharge to the venue listed below.       Follow Up Recommendations Home health PT    Equipment Recommendations  None recommended by PT    Recommendations for Other Services       Precautions / Restrictions Precautions Precautions: Fall Precaution Comments: desats fast Restrictions Weight Bearing Restrictions: No      Mobility  Bed Mobility Overal bed mobility: Modified Independent                Transfers Overall transfer level: Needs assistance Equipment used: None Transfers: Sit to/from Stand Sit to Stand: Supervision            Ambulation/Gait Ambulation/Gait assistance: Min guard Gait Distance (Feet): 160 Feet Assistive device: IV Pole Gait Pattern/deviations: Step-through pattern;Decreased stride length Gait velocity: decr   General Gait Details: multiple short standing breaks with  cues for activity pacing. cues for pursed lip breathing. one mild LOB with fatigue  Stairs            Wheelchair Mobility    Modified Rankin (Stroke Patients Only)       Balance Overall balance assessment: Needs assistance   Sitting balance-Leahy Scale: Good       Standing balance-Leahy Scale: Fair                               Pertinent Vitals/Pain Pain Assessment: Faces Faces Pain Scale: Hurts a little bit Pain Location: shoulder Pain Descriptors / Indicators: Aching Pain Intervention(s): Monitored during session    Home Living Family/patient expects to be discharged to:: Private residence Living Arrangements: Spouse/significant other;Other relatives Available Help at Discharge: Family Type of Home: House Home Access: Stairs to enter   [employer] of Steps: 3 Home Layout: One level Home Equipment: Grab bars - tub/shower;Walker - 2 wheels;Cane - single point      Prior Function Level of Independence: Independent               Hand Dominance        Extremity/Trunk Assessment   Upper Extremity Assessment Upper Extremity Assessment: Overall WFL for tasks assessed    Lower Extremity Assessment Lower Extremity Assessment: Overall WFL for tasks assessed       Communication   Communication: HOH  Cognition Arousal/Alertness: Awake/alert Behavior During Therapy: WFL for tasks assessed/performed Overall Cognitive Status: Within Functional Limits for tasks assessed  General Comments      Exercises     Assessment/Plan    PT Assessment Patient needs continued PT services  PT Problem List Decreased strength;Decreased activity tolerance;Decreased balance;Decreased mobility;Decreased knowledge of use of DME       PT Treatment Interventions      PT Goals (Current goals can be found in the Care Plan section)  Acute Rehab PT Goals Patient Stated Goal: get  stronger PT Goal Formulation: With patient Time For Goal Achievement: 09/06/18 Potential to Achieve Goals: Good    Frequency Min 3X/week   Barriers to discharge        Co-evaluation               AM-PAC PT "6 Clicks" Mobility  Outcome Measure Help needed turning from your back to your side while in a flat bed without using bedrails?: None Help needed moving from lying on your back to sitting on the side of a flat bed without using bedrails?: None Help needed moving to and from a bed to a chair (including a wheelchair)?: None Help needed standing up from a chair using your arms (e.g., wheelchair or bedside chair)?: None Help needed to walk in hospital room?: A Little Help needed climbing 3-5 steps with a railing? : A Lot 6 Click Score: 21    End of Session Equipment Utilized During Treatment: Oxygen Activity Tolerance: Patient tolerated treatment well Patient left: in bed;with call bell/phone within reach Nurse Communication: Mobility status PT Visit Diagnosis: Unsteadiness on feet (R26.81)    Time: 0211-1552 PT Time Calculation (min) (ACUTE ONLY): 19 min   Charges:   PT Evaluation $PT Eval Moderate Complexity: [place], PT, DPT Acute Rehabilitation Services Pager [phone] Office [phone]   Willy Eddy 08/23/2018, 3:05 PM

## 2018-08-24 ENCOUNTER — Other Ambulatory Visit: Payer: Self-pay

## 2018-08-24 ENCOUNTER — Telehealth: Payer: Self-pay

## 2018-08-24 NOTE — Telephone Encounter (Signed)
Transition Care Management Follow-up Telephone Call  Admit date: 08/22/2018 Discharge date: 08/23/2018 Principal Problem:  Chest pain   How have you been since you were released from the hospital? "He's doing better"   Do you understand why you were in the hospital? yes   Do you understand the discharge instructions? yes   Where were you discharged to? Home. Resides with family.    Items Reviewed:  Medications reviewed: yes  Allergies reviewed: yes  Dietary changes reviewed: yes  Referrals reviewed: yes   Functional Questionnaire:   Activities of Daily Living (ADLs):   He states they are independent in the following: ambulation, bathing and hygiene, feeding, continence, grooming, toileting and dressing States they require assistance with the following: None    Any transportation issues/concerns?: no   Any patient concerns? No.    Confirmed importance and date/time of follow-up visits scheduled yes  Provider Appointment booked with PCP on 08/30/2018 via telephone call. (Video not available)  Confirmed with patient if condition begins to worsen call PCP or go to the ER.  Patient was given the office number and encouraged to call back with question or concerns.  : yes

## 2018-08-27 ENCOUNTER — Telehealth: Payer: Self-pay | Admitting: Physician Assistant

## 2018-08-27 LAB — CULTURE, BLOOD (ROUTINE X 2)
Culture: NO GROWTH
Culture: NO GROWTH
Special Requests: ADEQUATE
Special Requests: ADEQUATE

## 2018-08-27 NOTE — Telephone Encounter (Signed)
Paperwork faxed and copy given to front desk to document charges.

## 2018-08-27 NOTE — Telephone Encounter (Signed)
Paperwork in Production manager for [employer]. Needs MD signature

## 2018-08-27 NOTE — Telephone Encounter (Signed)
Form completed and placed in basket  

## 2018-08-27 NOTE — Telephone Encounter (Signed)
I have placed a HH Cert. And a plan of care in the bin with a charge sheet.

## 2018-08-27 NOTE — Telephone Encounter (Signed)
Received paperwork from Patina and placed in fold to send to charge entry.

## 2018-08-29 ENCOUNTER — Other Ambulatory Visit: Payer: Self-pay

## 2018-08-29 ENCOUNTER — Ambulatory Visit (INDEPENDENT_AMBULATORY_CARE_PROVIDER_SITE_OTHER): Payer: Medicare Other

## 2018-08-29 ENCOUNTER — Ambulatory Visit: Payer: Medicare Other | Admitting: Internal Medicine

## 2018-08-29 ENCOUNTER — Encounter: Payer: Self-pay | Admitting: Internal Medicine

## 2018-08-29 ENCOUNTER — Other Ambulatory Visit (INDEPENDENT_AMBULATORY_CARE_PROVIDER_SITE_OTHER): Payer: Medicare Other

## 2018-08-29 VITALS — BP 90/50 | HR 83 | Temp 98.2°F | Ht 66.0 in | Wt 154.0 lb

## 2018-08-29 DIAGNOSIS — M05741 Rheumatoid arthritis with rheumatoid factor of right hand without organ or systems involvement: Secondary | ICD-10-CM | POA: Diagnosis not present

## 2018-08-29 DIAGNOSIS — R0609 Other forms of dyspnea: Secondary | ICD-10-CM

## 2018-08-29 DIAGNOSIS — M05742 Rheumatoid arthritis with rheumatoid factor of left hand without organ or systems involvement: Secondary | ICD-10-CM

## 2018-08-29 DIAGNOSIS — R0600 Dyspnea, unspecified: Secondary | ICD-10-CM | POA: Diagnosis not present

## 2018-08-29 DIAGNOSIS — J8410 Pulmonary fibrosis, unspecified: Secondary | ICD-10-CM | POA: Diagnosis not present

## 2018-08-29 DIAGNOSIS — J9611 Chronic respiratory failure with hypoxia: Secondary | ICD-10-CM | POA: Insufficient documentation

## 2018-08-29 DIAGNOSIS — R946 Abnormal results of thyroid function studies: Secondary | ICD-10-CM | POA: Diagnosis not present

## 2018-08-29 DIAGNOSIS — I10 Essential (primary) hypertension: Secondary | ICD-10-CM | POA: Diagnosis not present

## 2018-08-29 DIAGNOSIS — I2781 Cor pulmonale (chronic): Secondary | ICD-10-CM | POA: Insufficient documentation

## 2018-08-29 LAB — CBC WITH DIFFERENTIAL/PLATELET
Basophils Absolute: 0.1 10*3/uL (ref 0.0–0.1)
Basophils Relative: 0.9 % (ref 0.0–3.0)
Eosinophils Absolute: 0.7 10*3/uL (ref 0.0–0.7)
Eosinophils Relative: 5.1 % — ABNORMAL HIGH (ref 0.0–5.0)
HCT: 32.3 % — ABNORMAL LOW (ref 39.0–52.0)
Hemoglobin: 10.6 g/dL — ABNORMAL LOW (ref 13.0–17.0)
Lymphocytes Relative: 10.7 % — ABNORMAL LOW (ref 12.0–46.0)
Lymphs Abs: 1.5 10*3/uL (ref 0.7–4.0)
MCHC: 32.9 g/dL (ref 30.0–36.0)
MCV: 87.2 fl (ref 78.0–100.0)
Monocytes Absolute: 0.8 10*3/uL (ref 0.1–1.0)
Monocytes Relative: 5.4 % (ref 3.0–12.0)
Neutro Abs: 11.2 10*3/uL — ABNORMAL HIGH (ref 1.4–7.7)
Neutrophils Relative %: 77.9 % — ABNORMAL HIGH (ref 43.0–77.0)
Platelets: 217 10*3/uL (ref 150.0–400.0)
RBC: 3.7 Mil/uL — ABNORMAL LOW (ref 4.22–5.81)
RDW: 18.9 % — ABNORMAL HIGH (ref 11.5–15.5)
WBC: 14.4 10*3/uL — ABNORMAL HIGH (ref 4.0–10.5)

## 2018-08-29 LAB — HEPATIC FUNCTION PANEL
ALT: 19 U/L (ref 0–53)
AST: 11 U/L (ref 0–37)
Albumin: 3.3 g/dL — ABNORMAL LOW (ref 3.5–5.2)
Alkaline Phosphatase: 42 U/L (ref 39–117)
Bilirubin, Direct: 0.1 mg/dL (ref 0.0–0.3)
Total Bilirubin: 0.5 mg/dL (ref 0.2–1.2)
Total Protein: 6.2 g/dL (ref 6.0–8.3)

## 2018-08-29 LAB — TSH: TSH: 6.11 u[IU]/mL — ABNORMAL HIGH (ref 0.35–4.50)

## 2018-08-29 LAB — BASIC METABOLIC PANEL
BUN: 17 mg/dL (ref 6–23)
CO2: 28 mEq/L (ref 19–32)
Calcium: 8.3 mg/dL — ABNORMAL LOW (ref 8.4–10.5)
Chloride: 99 mEq/L (ref 96–112)
Creatinine, Ser: 1.02 mg/dL (ref 0.40–1.50)
GFR: 71.42 mL/min (ref >60.00)
Glucose, Bld: 91 mg/dL (ref 70–99)
Potassium: 3.3 mEq/L — ABNORMAL LOW (ref 3.5–5.1)
Sodium: 138 mEq/L (ref 135–145)

## 2018-08-29 LAB — BRAIN NATRIURETIC PEPTIDE: Pro B Natriuretic peptide (BNP): 96 pg/mL (ref 0.0–100.0)

## 2018-08-29 LAB — SEDIMENTATION RATE: Sed Rate: 86 mm/hr — ABNORMAL HIGH (ref 0–20)

## 2018-08-29 MED ORDER — PREDNISONE 10 MG PO TABS: ORAL_TABLET | ORAL | 0 refills | Status: DC

## 2018-08-29 NOTE — Assessment & Plan Note (Signed)
Followed by Joel Alert PA - onset 09/2014  RF and ccp positive with insp crackles at RA clinic 05/01/18 - hold mtx 08/29/2018 due to worsening PF   Acutely worse ild vs prior cxr's and baseline activity tol with ddx including ra lung dz vs mtx with higher esr/ eos so rec  1) hold mtx for now  2) short course prednisone than reassess in one week
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
## 2018-08-29 NOTE — Assessment & Plan Note (Signed)
Adequate control on present rx, reviewed in detail with pt > no change in rx needed  = 4lpm 24/7 and monitor with goal of keeping > 90%   I had an extended discussion with the patient reviewing all relevant studies completed to date and  lasting 25 minutes of a 40  minute post hosp f/u ov visit pt new to me    re  severe non-specific but potentially very serious refractory respiratory symptoms of uncertain and potentially multiple  etiologies.   Each maintenance medication was reviewed in detail including most importantly the difference between maintenance and prns and under what circumstances the prns are to be triggered using an action plan format that is not reflected in the computer generated alphabetically organized AVS.    Please see AVS for specific instructions unique to this office visit that I personally wrote and verbalized to the the pt in detail and then reviewed with pt  by my nurse highlighting any changes in therapy/plan of care  recommended at today's visit.

## 2018-08-29 NOTE — Assessment & Plan Note (Signed)
No evidence of chf/ copd > see pulonary fibrosis

## 2018-08-29 NOTE — Assessment & Plan Note (Signed)
D/c all bp meds 08/29/2018   Leg swelling and low bp may be partly related to norvasc > try off and recheck in one week

## 2018-08-29 NOTE — Assessment & Plan Note (Signed)
ddx = RA assoc PF, post cap ali with fp phase,  boop or mtx lung dz with high esr non-specific but suggestive of acute ongoing inflammatory dz   Will return in one week to regroup and rx with pred short term only for now

## 2018-08-29 NOTE — Assessment & Plan Note (Signed)
See echo 07/3018  RVS = 52 with nl L pressures  - CTa  08/22/18 neg PE/ pos severe PF   Probably classic WHO III  >>>  rx is to treat the underlyin hypoxemia, add aldactone for edema for vol control

## 2018-08-29 NOTE — Assessment & Plan Note (Signed)
D/c all bp meds 08/29/2018   Recheck full tfts new ov

## 2018-08-29 NOTE — Patient Instructions (Addendum)
Prednisone 10 mg take  4 each am x 2 days,   2 each am x 2 days,  1 each am x 2 days and  Then resume 5 mg daily   Stop norvasc and methotrexate    Please remember to go to the  x-ray department  Here and the lab in basement at  [facility] office  for your tests - we will call you with the results when they are available.   Please schedule a follow up office visit in 1 week  sooner if needed  with all medications /inhalers/ solutions in hand so we can verify exactly what you are taking. This includes all medications from all doctors and over the counters

## 2018-08-30 ENCOUNTER — Encounter (HOSPITAL_COMMUNITY): Payer: Self-pay | Admitting: Emergency Medicine

## 2018-08-30 ENCOUNTER — Emergency Department (HOSPITAL_COMMUNITY): Payer: Medicare Other

## 2018-08-30 ENCOUNTER — Inpatient Hospital Stay (HOSPITAL_COMMUNITY)
Admission: EM | Admit: 2018-08-30 | Discharge: 2018-09-06 | DRG: 189 | Disposition: A | Discharge status: Home Health | Payer: Medicare Other | Source: Ambulatory Visit | Attending: Internal Medicine | Admitting: Internal Medicine

## 2018-08-30 ENCOUNTER — Ambulatory Visit (INDEPENDENT_AMBULATORY_CARE_PROVIDER_SITE_OTHER): Payer: Medicare Other | Admitting: Physician Assistant

## 2018-08-30 ENCOUNTER — Other Ambulatory Visit: Payer: Self-pay

## 2018-08-30 ENCOUNTER — Encounter: Payer: Self-pay | Admitting: Physician Assistant

## 2018-08-30 ENCOUNTER — Telehealth: Payer: Self-pay | Admitting: Internal Medicine

## 2018-08-30 VITALS — BP 111/69 | HR 103 | Ht 66.0 in | Wt 154.0 lb

## 2018-08-30 DIAGNOSIS — R000 Tachycardia, unspecified: Secondary | ICD-10-CM | POA: Diagnosis not present

## 2018-08-30 DIAGNOSIS — J449 Chronic obstructive pulmonary disease, unspecified: Secondary | ICD-10-CM | POA: Diagnosis present

## 2018-08-30 DIAGNOSIS — R600 Localized edema: Secondary | ICD-10-CM | POA: Diagnosis not present

## 2018-08-30 DIAGNOSIS — Z9981 Dependence on supplemental oxygen: Secondary | ICD-10-CM | POA: Diagnosis not present

## 2018-08-30 DIAGNOSIS — Z8546 Personal history of malignant neoplasm of prostate: Secondary | ICD-10-CM

## 2018-08-30 DIAGNOSIS — Z77090 Contact with and (suspected) exposure to asbestos: Secondary | ICD-10-CM | POA: Diagnosis present

## 2018-08-30 DIAGNOSIS — M069 Rheumatoid arthritis, unspecified: Secondary | ICD-10-CM | POA: Diagnosis not present

## 2018-08-30 DIAGNOSIS — Z8701 Personal history of pneumonia (recurrent): Secondary | ICD-10-CM

## 2018-08-30 DIAGNOSIS — R0603 Acute respiratory distress: Secondary | ICD-10-CM | POA: Diagnosis not present

## 2018-08-30 DIAGNOSIS — Z87891 Personal history of nicotine dependence: Secondary | ICD-10-CM | POA: Diagnosis not present

## 2018-08-30 DIAGNOSIS — H9191 Unspecified hearing loss, right ear: Secondary | ICD-10-CM | POA: Diagnosis present

## 2018-08-30 DIAGNOSIS — J9620 Acute and chronic respiratory failure, unspecified whether with hypoxia or hypercapnia: Secondary | ICD-10-CM | POA: Diagnosis present

## 2018-08-30 DIAGNOSIS — C61 Malignant neoplasm of prostate: Secondary | ICD-10-CM | POA: Diagnosis present

## 2018-08-30 DIAGNOSIS — J8410 Pulmonary fibrosis, unspecified: Secondary | ICD-10-CM | POA: Diagnosis not present

## 2018-08-30 DIAGNOSIS — D72829 Elevated white blood cell count, unspecified: Secondary | ICD-10-CM | POA: Diagnosis not present

## 2018-08-30 DIAGNOSIS — E785 Hyperlipidemia, unspecified: Secondary | ICD-10-CM | POA: Diagnosis present

## 2018-08-30 DIAGNOSIS — K219 Gastro-esophageal reflux disease without esophagitis: Secondary | ICD-10-CM | POA: Diagnosis not present

## 2018-08-30 DIAGNOSIS — Z20828 Contact with and (suspected) exposure to other viral communicable diseases: Secondary | ICD-10-CM | POA: Diagnosis present

## 2018-08-30 DIAGNOSIS — Z7951 Long term (current) use of inhaled steroids: Secondary | ICD-10-CM | POA: Diagnosis not present

## 2018-08-30 DIAGNOSIS — M05742 Rheumatoid arthritis with rheumatoid factor of left hand without organ or systems involvement: Secondary | ICD-10-CM | POA: Diagnosis not present

## 2018-08-30 DIAGNOSIS — Z7952 Long term (current) use of systemic steroids: Secondary | ICD-10-CM | POA: Diagnosis not present

## 2018-08-30 DIAGNOSIS — I5033 Acute on chronic diastolic (congestive) heart failure: Secondary | ICD-10-CM | POA: Diagnosis not present

## 2018-08-30 DIAGNOSIS — E876 Hypokalemia: Secondary | ICD-10-CM | POA: Diagnosis present

## 2018-08-30 DIAGNOSIS — R0602 Shortness of breath: Secondary | ICD-10-CM | POA: Diagnosis not present

## 2018-08-30 DIAGNOSIS — I248 Other forms of acute ischemic heart disease: Secondary | ICD-10-CM | POA: Diagnosis present

## 2018-08-30 DIAGNOSIS — D649 Anemia, unspecified: Secondary | ICD-10-CM | POA: Diagnosis present

## 2018-08-30 DIAGNOSIS — R0781 Pleurodynia: Secondary | ICD-10-CM

## 2018-08-30 DIAGNOSIS — J9611 Chronic respiratory failure with hypoxia: Secondary | ICD-10-CM | POA: Diagnosis present

## 2018-08-30 DIAGNOSIS — Z8249 Family history of ischemic heart disease and other diseases of the circulatory system: Secondary | ICD-10-CM | POA: Diagnosis not present

## 2018-08-30 DIAGNOSIS — M05741 Rheumatoid arthritis with rheumatoid factor of right hand without organ or systems involvement: Secondary | ICD-10-CM | POA: Diagnosis not present

## 2018-08-30 DIAGNOSIS — M7989 Other specified soft tissue disorders: Secondary | ICD-10-CM | POA: Diagnosis not present

## 2018-08-30 DIAGNOSIS — Z8261 Family history of arthritis: Secondary | ICD-10-CM

## 2018-08-30 DIAGNOSIS — I110 Hypertensive heart disease with heart failure: Secondary | ICD-10-CM | POA: Diagnosis present

## 2018-08-30 DIAGNOSIS — R0689 Other abnormalities of breathing: Secondary | ICD-10-CM | POA: Diagnosis not present

## 2018-08-30 DIAGNOSIS — J441 Chronic obstructive pulmonary disease with (acute) exacerbation: Secondary | ICD-10-CM

## 2018-08-30 DIAGNOSIS — Z03818 Encounter for observation for suspected exposure to other biological agents ruled out: Secondary | ICD-10-CM | POA: Diagnosis not present

## 2018-08-30 DIAGNOSIS — Z515 Encounter for palliative care: Secondary | ICD-10-CM | POA: Diagnosis not present

## 2018-08-30 DIAGNOSIS — J9621 Acute and chronic respiratory failure with hypoxia: Principal | ICD-10-CM | POA: Diagnosis present

## 2018-08-30 DIAGNOSIS — Z8611 Personal history of tuberculosis: Secondary | ICD-10-CM

## 2018-08-30 LAB — CBC WITH DIFFERENTIAL/PLATELET
Abs Immature Granulocytes: 0.05 10*3/uL (ref 0.00–0.07)
Basophils Absolute: 0 10*3/uL (ref 0.0–0.1)
Basophils Relative: 0 %
Eosinophils Absolute: 0.4 10*3/uL (ref 0.0–0.5)
Eosinophils Relative: 4 %
HCT: 32.6 % — ABNORMAL LOW (ref 39.0–52.0)
Hemoglobin: 10.4 g/dL — ABNORMAL LOW (ref 13.0–17.0)
Immature Granulocytes: 1 %
Lymphocytes Relative: 5 %
Lymphs Abs: 0.5 10*3/uL — ABNORMAL LOW (ref 0.7–4.0)
MCH: 28.4 pg (ref 26.0–34.0)
MCHC: 31.9 g/dL (ref 30.0–36.0)
MCV: 89.1 fL (ref 80.0–100.0)
Monocytes Absolute: 0.3 10*3/uL (ref 0.1–1.0)
Monocytes Relative: 3 %
Neutro Abs: 8.7 10*3/uL — ABNORMAL HIGH (ref 1.7–7.7)
Neutrophils Relative %: 87 %
Platelets: 202 10*3/uL (ref 150–400)
RBC: 3.66 MIL/uL — ABNORMAL LOW (ref 4.22–5.81)
RDW: 18.1 % — ABNORMAL HIGH (ref 11.5–15.5)
WBC: 10 10*3/uL (ref 4.0–10.5)
nRBC: 0 % (ref 0.0–0.2)

## 2018-08-30 LAB — BLOOD GAS, ARTERIAL
Acid-Base Excess: 0.7 mmol/L (ref 0.0–2.0)
Bicarbonate: 25.2 mmol/L (ref 20.0–28.0)
FIO2: 32
O2 Saturation: 92.8 %
Patient temperature: 36.6
pCO2 arterial: 33.6 mmHg (ref 32.0–48.0)
pH, Arterial: 7.468 — ABNORMAL HIGH (ref 7.350–7.450)
pO2, Arterial: 66.9 mmHg — ABNORMAL LOW (ref 83.0–108.0)

## 2018-08-30 LAB — BLOOD GAS, VENOUS
Acid-Base Excess: 6.7 mmol/L — ABNORMAL HIGH (ref 0.0–2.0)
Bicarbonate: 26.4 mmol/L (ref 20.0–28.0)
FIO2: 44
O2 Saturation: 13.4 %
Patient temperature: 37.2
pCO2, Ven: 48.7 mmHg (ref 44.0–60.0)
pH, Ven: 7.421 (ref 7.250–7.430)
pO2, Ven: <31 mmHg — CL (ref 32.0–45.0)

## 2018-08-30 LAB — COMPREHENSIVE METABOLIC PANEL
ALT: 23 U/L (ref 0–44)
AST: 17 U/L (ref 15–41)
Albumin: 3.2 g/dL — ABNORMAL LOW (ref 3.5–5.0)
Alkaline Phosphatase: 48 U/L (ref 38–126)
Anion gap: 14 (ref 5–15)
BUN: 13 mg/dL (ref 8–23)
CO2: 25 mmol/L (ref 22–32)
Calcium: 8.8 mg/dL — ABNORMAL LOW (ref 8.9–10.3)
Chloride: 100 mmol/L (ref 98–111)
Creatinine, Ser: 0.92 mg/dL (ref 0.61–1.24)
GFR calc Af Amer: >60 mL/min (ref >60)
GFR calc non Af Amer: >60 mL/min (ref >60)
Glucose, Bld: 92 mg/dL (ref 70–99)
Potassium: 3.8 mmol/L (ref 3.5–5.1)
Sodium: 139 mmol/L (ref 135–145)
Total Bilirubin: 0.7 mg/dL (ref 0.3–1.2)
Total Protein: 6.9 g/dL (ref 6.5–8.1)

## 2018-08-30 LAB — BRAIN NATRIURETIC PEPTIDE: B Natriuretic Peptide: 76 pg/mL (ref 0.0–100.0)

## 2018-08-30 LAB — TROPONIN I
Troponin I: 0.05 ng/mL (ref <0.03)
Troponin I: 0.05 ng/mL (ref <0.03)

## 2018-08-30 LAB — SARS CORONAVIRUS 2 BY RT PCR (HOSPITAL ORDER, PERFORMED IN ~~LOC~~ HOSPITAL LAB): SARS Coronavirus 2: NEGATIVE

## 2018-08-30 LAB — MRSA PCR SCREENING: MRSA by PCR: NEGATIVE

## 2018-08-30 MED ORDER — ATORVASTATIN CALCIUM 10 MG PO TABS
10.0000 mg | ORAL_TABLET | Freq: Every day | ORAL | Status: DC
  Administered 2018-08-30 – 2018-09-05 (×7): 10 mg via ORAL
  Filled 2018-08-30 (×7): qty 1

## 2018-08-30 MED ORDER — ASPIRIN 81 MG PO CHEW
324.0000 mg | CHEWABLE_TABLET | Freq: Once | ORAL | Status: AC
  Administered 2018-08-30: 14:00:00 324 mg via ORAL
  Filled 2018-08-30: qty 4

## 2018-08-30 MED ORDER — SODIUM CHLORIDE 0.9 % IV BOLUS
500.0000 mL | Freq: Once | INTRAVENOUS | Status: AC
  Administered 2018-08-30: 500 mL via INTRAVENOUS

## 2018-08-30 MED ORDER — POLYETHYLENE GLYCOL 3350 17 G PO PACK: 17.0000 g | PACK | Freq: Every day | ORAL | Status: DC | PRN

## 2018-08-30 MED ORDER — ACETAMINOPHEN 325 MG PO TABS
650.0000 mg | ORAL_TABLET | Freq: Four times a day (QID) | ORAL | Status: DC | PRN
  Filled 2018-08-30: qty 2

## 2018-08-30 MED ORDER — ALBUTEROL (5 MG/ML) CONTINUOUS INHALATION SOLN
20.0000 mg/h | INHALATION_SOLUTION | RESPIRATORY_TRACT | Status: DC
  Administered 2018-08-30: 20 mg/h via RESPIRATORY_TRACT
  Filled 2018-08-30: qty 20

## 2018-08-30 MED ORDER — IPRATROPIUM-ALBUTEROL 0.5-2.5 (3) MG/3ML IN SOLN
3.0000 mL | Freq: Four times a day (QID) | RESPIRATORY_TRACT | Status: DC
  Administered 2018-08-30 – 2018-08-31 (×5): 3 mL via RESPIRATORY_TRACT
  Filled 2018-08-30 (×5): qty 3

## 2018-08-30 MED ORDER — ENOXAPARIN SODIUM 40 MG/0.4ML ~~LOC~~ SOLN
40.0000 mg | SUBCUTANEOUS | Status: DC
  Administered 2018-08-30 – 2018-09-05 (×6): 40 mg via SUBCUTANEOUS
  Filled 2018-08-30 (×7): qty 0.4

## 2018-08-30 MED ORDER — SODIUM CHLORIDE 0.9 % IV SOLN
500.0000 mg | INTRAVENOUS | Status: DC
  Administered 2018-08-30 – 2018-09-03 (×5): 500 mg via INTRAVENOUS
  Filled 2018-08-30 (×5): qty 500

## 2018-08-30 MED ORDER — IOHEXOL 350 MG/ML SOLN
100.0000 mL | Freq: Once | INTRAVENOUS | Status: AC | PRN
  Administered 2018-08-30: 100 mL via INTRAVENOUS

## 2018-08-30 MED ORDER — ALBUTEROL SULFATE (2.5 MG/3ML) 0.083% IN NEBU
2.5000 mg | INHALATION_SOLUTION | RESPIRATORY_TRACT | Status: DC | PRN
  Administered 2018-09-04: 2.5 mg via RESPIRATORY_TRACT
  Filled 2018-08-30: qty 3

## 2018-08-30 MED ORDER — ACETAMINOPHEN 650 MG RE SUPP: 650.0000 mg | Freq: Four times a day (QID) | RECTAL | Status: DC | PRN

## 2018-08-30 MED ORDER — ONDANSETRON HCL 4 MG PO TABS: 4.0000 mg | ORAL_TABLET | Freq: Four times a day (QID) | ORAL | Status: DC | PRN

## 2018-08-30 MED ORDER — GUAIFENESIN-DM 100-10 MG/5ML PO SYRP
10.0000 mL | ORAL_SOLUTION | Freq: Three times a day (TID) | ORAL | Status: AC
  Administered 2018-08-30 – 2018-09-01 (×3): 10 mL via ORAL
  Filled 2018-08-30 (×3): qty 10

## 2018-08-30 MED ORDER — ESCITALOPRAM OXALATE 10 MG PO TABS
10.0000 mg | ORAL_TABLET | Freq: Every day | ORAL | Status: DC
  Administered 2018-08-30 – 2018-09-06 (×8): 10 mg via ORAL
  Filled 2018-08-30 (×8): qty 1

## 2018-08-30 MED ORDER — METHYLPREDNISOLONE SODIUM SUCC 125 MG IJ SOLR: 80.0000 mg | Freq: Once | INTRAMUSCULAR | Status: DC

## 2018-08-30 MED ORDER — ONDANSETRON HCL 4 MG/2ML IJ SOLN
4.0000 mg | Freq: Four times a day (QID) | INTRAMUSCULAR | Status: DC | PRN
  Administered 2018-09-02 – 2018-09-04 (×2): 4 mg via INTRAVENOUS
  Filled 2018-08-30 (×2): qty 2

## 2018-08-30 MED ORDER — SODIUM CHLORIDE 0.9 % IV SOLN
INTRAVENOUS | Status: DC | PRN
  Administered 2018-08-30: 18:00:00 500 mL via INTRAVENOUS

## 2018-08-30 MED ORDER — METHYLPREDNISOLONE SODIUM SUCC 125 MG IJ SOLR
60.0000 mg | Freq: Two times a day (BID) | INTRAMUSCULAR | Status: DC
  Administered 2018-08-31 – 2018-09-04 (×9): 60 mg via INTRAVENOUS
  Filled 2018-08-30 (×9): qty 2

## 2018-08-30 MED ORDER — FOLIC ACID 1 MG PO TABS
1.0000 mg | ORAL_TABLET | Freq: Every day | ORAL | Status: DC
  Administered 2018-08-30 – 2018-09-06 (×8): 1 mg via ORAL
  Filled 2018-08-30 (×8): qty 1

## 2018-08-30 NOTE — Progress Notes (Signed)
I have discussed the procedure for the virtual visit with the patient who has given consent to proceed with assessment and treatment.   BETHANY DILLARD, CMA     

## 2018-08-30 NOTE — Progress Notes (Signed)
Spoke with pt's spouse and notified of results per Dr. Wert. Pt verbalized understanding and denied any questions. 

## 2018-08-30 NOTE — ED Notes (Signed)
Assisted pt to bedside toilet.
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
## 2018-08-30 NOTE — Telephone Encounter (Signed)
Instructions from OV 5/6 Prednisone 10 mg take  4 each am x 2 days,   2 each am x 2 days,  1 each am x 2 days and  Then resume 5 mg daily   Stop norvasc and methotrexate    Please remember to go to the  x-ray department  Here and the lab in basement at  [facility] office  for your tests - we will call you with the results when they are available.   Please schedule a follow up office visit in 1 week  sooner if needed  with all medications /inhalers/ solutions in hand so we can verify exactly what you are taking. This includes all medications from all doctors and over the counters     Attempted to call pt to speak with him or his wife Joel Valdez in regards to the call from [place] but unable to reach and unable to leave message due to VM not being set up. Will try to call back later.
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
## 2018-08-30 NOTE — Telephone Encounter (Signed)
Called and spoke with pt's wife [name] who stated that she believes she is going to have to call 911 for pt. [name] stated pt has become a lot worse since OV yesterday 5/6 with MW. [name] stated that pt went to bathroom and when he came back from the bathroom, pt was really struggling to get a breath, sats were 77% on 4.5L O2. Currently with pt sitting, pt's sats were 83% on 4.5L O2 and pulse was ranging from 121-126.   [name] also stated that pt is having a lot of pain in his left lung.  I stated to [name] that it is best for her to go ahead and call 911 due to pt really struggling to get a breath and having low O2 sats even on the amount of O2 he is currently wearing and elevated pulse and [name] verbalized understanding and stated she was going to call 911 to get pt some help.  Routing to Dr. Melvyn Novas as an Juluis Rainier.

## 2018-08-30 NOTE — Progress Notes (Addendum)
RT attempted to place patient on BIPAP, RT used settings IPAP 10 and IPAP 5 and patient was still unable to tolerate; therefore placed on 5L O2 via nasal cannula for SATs 93%. MD made aware, patient will be intubated. RT will continue to monitor and assess.

## 2018-08-30 NOTE — ED Notes (Addendum)
Date and time results received:   Test: vbg po2 Critical Value:< 31  Name of Provider Notified: Dr Reather Converse   Orders Received? Or Actions Taken?:

## 2018-08-30 NOTE — Progress Notes (Signed)
Virtual Visit via Telephone Note  I connected with Joel Valdez on 08/30/18 at 10:00 AM EDT by telephone and verified that I am speaking with the correct person using two identifiers.  Location: Patient: Home Provider: [facility]   I discussed the limitations, risks, security and privacy concerns of performing an evaluation and management service by telephone and the availability of in person appointments. I also discussed with the patient that there may be a patient responsible charge related to this service. The patient expressed understanding and agreed to proceed.  History of Present Illness: Patient presnts via phone today with wife for hospital follow-up. He was sent back to ER on 08/22/2018 for c/o atypical chest pain, leukocytosis and SOB. Patient was also found to be hypotensive on arrival. ER workup included labs (creatinine at 1.31 and BUN at 31, WBC with leukocytosis at 17,000 with 93% neutrophils), EKG (NSR with heart rate 82 and T wave inversions, unchanged from prior ekg), CTA (negative for clot, improved lung findings from prior imaging). He was kept overnight for observation and ACS rule out. IV fluids given with significant improvement in vitals, WBC count and Creatinine. Troponin negative. He was discharged home on 08/23/2018 to follow-up with PCP and Pulmonology.  Today patient is with increased SOB and notes new onset pain in lower lung. Wife notes O2 had to be increased to 4.5 L but he is saturating at 96% there. She has called Pulmonology but waiting on a call back for next steps. Of note, he did see Pulmonology yesterday for assessment at which time he was doing better per notes and patient account today. He is not sure what happened but things changed early this morning. Wife notes his O2 is saturating well with him walking around currently. He is winded on interview.   Observations/Objective:   Assessment and Plan: 1. Acute respiratory distress 2.  Pleuritic chest pain  Discussed giving dyspnea with increase O2 need and pleuritic pain he needs reassessment in the ER. Patient states he will go to the ER if needed but wants to hear back from the [facility]. Wife notes Pulmonology has just called her but she missed the call. She is to call right back to talk to them further but again encouraged 911.   3. Leukocytosis, unspecified type CBC rechecked yesterday by Pulmonology -- had decreased from 17.2 to 11.5 in hospital. Now back up to 14.4 but Neutrophil count decreased. Anemia improved. Likely related to steroids. Will monitor closely.   Follow Up Instructions:    I discussed the assessment and treatment plan with the patient. The patient was provided an opportunity to ask questions and all were answered. The patient agreed with the plan and demonstrated an understanding of the instructions.   The patient was advised to call back or seek an in-person evaluation if the symptoms worsen or if the condition fails to improve as anticipated.  I provided 15 minutes of non-face-to-face time during this encounter.   Leeanne Rio, PA-C

## 2018-08-30 NOTE — Care Management Obs Status (Signed)
[facility] NOTIFICATION   Patient Details  Name: Joel Valdez. Yun MRN: [id number] Date of Birth: Sep 14, 1945   Medicare Observation Status Notification Given:  Yes    [place], LCSW 08/30/2018, 7:16 PM

## 2018-08-30 NOTE — ED Triage Notes (Signed)
Pt's states that his ankles are swollen

## 2018-08-30 NOTE — Telephone Encounter (Signed)
Patient is returning phone call.  Patient phone number is [phone].

## 2018-08-30 NOTE — ED Provider Notes (Signed)
Signout from Dr. Reather Converse.  73 year old with significant lung disease recently admitted intubated a few weeks ago.  He is here again with increased shortness of breath.  Has received steroids and nebs improvement in her symptoms. Physical Exam  BP (!) 116/59   Pulse (!) 116   Temp 97.9 F (36.6 C) (Oral)   Resp (!) 27   Ht 5\' 6"  (1.676 m)   Wt 69.9 kg   SpO2 96%   BMI 24.86 kg/m   Physical Exam  ED Course/Procedures     Procedures  MDM  Plan is for admission to hospitalist and follow-up CT PE.        Hayden Rasmussen, MD 08/31/18 [phone]

## 2018-08-30 NOTE — ED Triage Notes (Signed)
Pt has been sob for the past 3 hours refuses to use inhalers ems had him cpap with improvement pt given 125mg  solumederol in route.

## 2018-08-30 NOTE — ED Notes (Signed)
Date and time results received: 08/30/18 1:51 PM   Test: trop Critical Value: 0.05  Name of Provider Notified: Dr Reather Converse   Orders Received? Or Actions Taken?:

## 2018-08-30 NOTE — Telephone Encounter (Signed)
That is appropriate, thanks

## 2018-08-30 NOTE — Progress Notes (Signed)
Spoke with the pt's spouse and notified of recs per MW She verbalized understanding.

## 2018-08-31 ENCOUNTER — Telehealth: Payer: Self-pay

## 2018-08-31 ENCOUNTER — Observation Stay (HOSPITAL_COMMUNITY): Payer: Medicare Other

## 2018-08-31 DIAGNOSIS — R600 Localized edema: Secondary | ICD-10-CM | POA: Diagnosis not present

## 2018-08-31 LAB — RESPIRATORY PANEL BY PCR

## 2018-08-31 LAB — TROPONIN I: Troponin I: 0.04 ng/mL (ref <0.03)

## 2018-08-31 LAB — PROCALCITONIN: Procalcitonin: 0.15 ng/mL

## 2018-08-31 MED ORDER — FUROSEMIDE 10 MG/ML IJ SOLN
40.0000 mg | Freq: Once | INTRAMUSCULAR | Status: AC
  Administered 2018-08-31: 08:00:00 40 mg via INTRAVENOUS
  Filled 2018-08-31: qty 4

## 2018-08-31 MED ORDER — BUDESONIDE 0.25 MG/2ML IN SUSP
0.2500 mg | Freq: Two times a day (BID) | RESPIRATORY_TRACT | Status: DC
  Administered 2018-08-31 – 2018-09-06 (×13): 0.25 mg via RESPIRATORY_TRACT
  Filled 2018-08-31 (×13): qty 2

## 2018-08-31 MED ORDER — KETOROLAC TROMETHAMINE 15 MG/ML IJ SOLN
15.0000 mg | Freq: Four times a day (QID) | INTRAMUSCULAR | Status: AC | PRN
  Administered 2018-08-31 – 2018-09-01 (×3): 15 mg via INTRAVENOUS
  Filled 2018-08-31 (×3): qty 1

## 2018-08-31 MED ORDER — CHLORHEXIDINE GLUCONATE CLOTH 2 % EX PADS
6.0000 | MEDICATED_PAD | Freq: Every day | CUTANEOUS | Status: DC
  Administered 2018-09-01 – 2018-09-06 (×6): 6 via TOPICAL

## 2018-08-31 MED ORDER — IPRATROPIUM-ALBUTEROL 0.5-2.5 (3) MG/3ML IN SOLN
3.0000 mL | Freq: Three times a day (TID) | RESPIRATORY_TRACT | Status: DC
  Administered 2018-09-01 – 2018-09-06 (×17): 3 mL via RESPIRATORY_TRACT
  Filled 2018-08-31 (×17): qty 3

## 2018-08-31 NOTE — TOC Initial Note (Addendum)
Transition of Care (TOC) - Initial/Assessment Note    Patient Details  Name: Joel Valdez MRN: [id number] Date of Birth: March 04, 1946  Transition of Care [facility]) CM/SW Contact:    Joel Mage, LCSW Phone Number: 08/31/2018, 1:25 PM  Clinical Narrative: 73 YO Caucasian male with interstitial lung disease,chronic, hypoxic; respiratory failure; hypertension; rheumatoid arthritis  Who presents with SOB and pain.  Was recently released from hospital and sent home on 4L O2 through [facility], and also receiving [facility] and nursing through [facility]. Pt presents as somewhat confused,  sking me what day of the week it is, though he did get month and year correct, and also states he is not on home O2, "but if the Dr tells me I need it I guess I will use it."  Has lived here 90 years with wife of 8 years since moving here from [place] post retirement.  Sattes he spends his time watching TV, mowing the grass and doing chores around the house.  Uses no DME.  Gave permission to talk to wife, who is unhappy with [facility] as his portable O2 does not last for the time required to go to the Dr office, unhappy with Joel Valdez as the nurse would not look at pt's bedsores, and unhappy with patient "because he will not do his breathing treatments and drinks coffee all day instead of water."  She is hopeful that he can go to rehab from here so that he cn fully recover with the care he needs.  Dr Joel Valdez with ordering PT consult.  In the meantime, asked Joel Valdez with [facility] to contact wife to see if they can resolve the portable O2 issue, and will await PT recommendation.            Expected Discharge Plan: [place] Barriers to Discharge: No Barriers Identified   Patient Goals and CMS Choice Patient states their goals for this hospitalization and ongoing recovery are:: "I feel pretty rough.  I hope I can feel better and get back home soon."      Expected Discharge Plan and Services Expected Discharge Plan: [place] In-house Referral: NA Discharge Planning Services: CM Consult, Follow-up appt scheduled   Living arrangements for the past 2 months: Single Family Home                                      Prior Living Arrangements/Services Living arrangements for the past 2 months: Single Family Home Lives with:: Spouse Patient language and need for interpreter reviewed:: Yes Do you feel safe going back to the place where you live?: Yes      Need for Family Participation in Patient Care: Yes (Comment) Care giver support system in place?: Yes (comment) Current home services: Home RN, Home RT Criminal Activity/Legal Involvement Pertinent to Current Situation/Hospitalization: No - Comment as needed  Activities of Daily Living Home Assistive Devices/Equipment: Hearing aid ADL Screening (condition at time of admission) Patient's cognitive ability adequate to safely complete daily activities?: Yes Is the patient deaf or have difficulty hearing?: Yes Does the patient have difficulty seeing, even when wearing glasses/contacts?: Yes Does the patient have difficulty concentrating, remembering, or making decisions?: No Patient able to express need for assistance with ADLs?: Yes Does the patient have difficulty dressing or bathing?: No Independently performs ADLs?: Yes (appropriate for developmental age) Does the patient have difficulty walking or  climbing stairs?: No Weakness of Legs: None Weakness of Arms/Hands: None  Permission Sought/Granted Permission sought to share information with : Family Supports Permission granted to share information with : Yes, Verbal Permission Granted  Share Information with NAME: Joel Valdez     Permission granted to share info w Relationship: wife  Permission granted to share info w Contact Information: [phone]  Emotional Assessment Appearance:: Appears stated age Attitude/Demeanor/Rapport: Engaged Affect (typically observed):  Appropriate Orientation: : Oriented to Self, Oriented to Place, Oriented to Situation Alcohol / Substance Use: Not Applicable Psych Involvement: No (comment)  Admission diagnosis:  Acute respiratory distress [R06.03] Acute exacerbation of chronic obstructive pulmonary disease (COPD) ([place]) [J44.1] Patient Active Problem List   Diagnosis Date Noted  . Acute on chronic respiratory failure ([place]) 08/30/2018  . DOE (dyspnea on exertion) 08/29/2018  . Abnormal thyroid function test 08/29/2018  . Chronic respiratory failure with hypoxia ([place]) 08/29/2018  . Cor pulmonale (chronic) ([place]) 08/29/2018  . Acute prerenal azotemia 08/23/2018  . Interstitial pulmonary fibrosis ([place]) 08/23/2018  . Pressure injury of skin 08/23/2018  . Chest pain 08/22/2018  . HCAP (healthcare-associated pneumonia) 08/22/2018  . Memory loss 03/30/2018  . Hyperlipidemia 04/17/2016  . Pelvic lymphadenopathy   . Rheumatoid arthritis involving both hands ([place]) 01/26/2015  . Depression 11/21/2014  . Prostate cancer ([place]) 04/28/2014  . Bruit 11/13/2013  . Abnormal electrocardiogram 11/13/2013  . Glaucoma 09/27/2013  . Essential hypertension, benign 09/27/2013  . Nocturia 09/27/2013  . Colon cancer screening 09/27/2013   PCP:  Joel Jeans, PA-C Pharmacy:   [place], [place] - [address] [place] [address] Phone: [phone] Fax: [phone]     Social Determinants of Health (SDOH) Interventions    Readmission Risk Interventions No flowsheet data found.

## 2018-08-31 NOTE — Telephone Encounter (Signed)
Patient scheduled for appointment with PCP on 09/07/18 at 11am.
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
## 2018-08-31 NOTE — Progress Notes (Signed)
PROGRESS NOTE    Joel Valdez  VOJ:[id number] DOB: Apr 17, 1946 DOA: 08/30/2018 PCP: [name], PA-C   Brief Narrative:  Per HPI: Joel Valdez is a 73 y.o. male with medical history significant for interstitial lung disease chronic respiratory failure, hypertension, rheumatoid arthritis, immunosuppression, who presented to the ED with complaints of difficulty breathing started about 3 hours prior to arrival in the ED.  Reports worsening of his chronic cough also, but his cough over the past 3 weeks has been nonproductive.  Reports worsening lower extremity swelling R>L, without pain redness or warmth.  No fever or chills. Also reports onset of left-sided chest pain that started this morning as he woke up.  Pain is radiating, worse with movement.  Patient reports pain at this time improved after he was given some medications in the ED (aspirin).  Reports chest pain is similar to chest pain he had recently requiring hospitalization. During her tele-visit with his primary care provider today, patient was winded on interview, with patient's O2 at that time on 4.5 L was 96%, but later dropped to 77%.   EMS gave 1 rate 25 mg of Solu-Medrol prior to arrival in the ED  Patient was admitted with acute on chronic hypoxemic respiratory failure likely secondary to acute bronchitis/atypical infection with possible associated flare of his advanced interstitial fibrosis.  He is also noted to have bilateral pleural effusions and has been started on IV Solu-Medrol as well as DuoNebs and azithromycin along with Lasix.  He is also noted to have right lower extremity swelling for which venous Dopplers are pending.  He was notably recently hospitalized for multifocal pneumonia in early April and was intubated at that time and had a complication of small pneumothorax which was treated with chest tube.  He was also noted to have chest pain later in April with unremarkable echocardiogram and was discharged home on 4  to 6 L of oxygen.   Assessment & Plan:   Active Problems:   Acute on chronic respiratory failure (HCC)  Acute on chronic hypoxic respiratory failure-increasing dyspnea, worsening dry cough, initial hypoxemia reported at home 77% O2 sats on 4.5 L, the time of my evaluation O2 sats greater than 90% on 3.5 L.  Likely secondary to acute bronchitis/typical infection with possible flare of his advanced interstitial fibrosis. As suggested on CTA chest.  Also new trace left pleural effusion, on my view his right pleural effusion reported as enlarging is Small and not enough to account for this acute worsening of his respiratory status.  125 mg Solu-Medrol given by EMS.  SARS-CoV-2 test negative. -Continue IV Solu-Medrol 60 mg twice daily for now and wean O2 -Leukocytosis is somewhat improved, but will check procalcitonin as well as respiratory panel -IV azithromycin -Respiratory protocol, supplemental O2 - Obtain ABG-pH 7.4, PCO2 33, PaO2 66.9 - Mucolytics, duo nebs scheduled, albuterol as needed - Pulmology consult appreciated today -Minimal diuresis noted overnight, will increase Lasix dose to 40 mg IV x1 for now and assess response  Chest pain- atypical, appears musculoskeletal.  Recent admission for same ruled out with EKG and troponins with echo.  Mild elevated troponin- 0.05, likely demand 2/2 acute respiratory distress.  EKG unchanged from prior. -EKG with normal sinus rhythm and no changes consistent with ACS noted this a.m. -Flat troponin trend noted.  No suspicion for ACS.  Likely demand ischemia.  Right lower extremity swelling- history of cor pulmonale.  Recent echo 07/2018-EF  >65%.  Home Medication Lasix 20 mg  daily. -Increase IV Lasix to 40 mg x 1 dose for now - Right Lower extremity venous Dopplers pending this a.m.  Interstitial lung disease- Likely contributing to his acute on chronic respiratory failure.  Currently on 3.5 L with sats greater than 90%.  Follows with Dr.  Melvyn Novas.  Rheumatoid arthritis-appears stable.  Home medications methotrexate and leuprolide injections every 3 months.  Hypertension-blood pressures systolic soft to stable. Norvasc recently d/c'd by PCP. -Hold low-dose lisinopril 10 mg for now with contrast exposure   DVT prophylaxis: Lovenox Code Status: Full Family Communication: None at bedside, will plan to call wife to update later Disposition Plan: Per pulmonology recommendations.  Increase diuresis and check Doppler lower extremity.  Wean oxygen as tolerated.   Consultants:   Pulmonology  Procedures:   None  Antimicrobials:   IV azithromycin 5/7->   Subjective: Patient seen and evaluated today with no new acute complaints or concerns. No acute concerns or events noted overnight.  He continues to remain on high flow nasal cannula over 15 L.  Objective: Vitals:   08/31/18 0400 08/31/18 0500 08/31/18 0600 08/31/18 0646  BP: (!) 98/51 (!) 84/64 104/70   Pulse: 79 79 77   Resp: 18 16 (!) 24   Temp: 98.6 F (37 C)     TempSrc: Oral     SpO2: 93% 97% 99%   Weight:    69.2 kg  Height:        Intake/Output Summary (Last 24 hours) at 08/31/2018 0713 Last data filed at 08/31/2018 0603 Gross per 24 hour  Intake 306.56 ml  Output --  Net 306.56 ml   Filed Weights   08/30/18 1136 08/31/18 0646  Weight: 69.9 kg 69.2 kg    Examination:  General exam: Appears calm and comfortable  Respiratory system: Minimal breath sounds bilaterally. Respiratory effort normal.  Continues on high flow nasal cannula over 15 L. Cardiovascular system: S1 & S2 heard, RRR. No JVD, murmurs, rubs, gallops or clicks. No pedal edema. Gastrointestinal system: Abdomen is nondistended, soft and nontender. No organomegaly or masses felt. Normal bowel sounds heard. Central nervous system: Alert and oriented. No focal neurological deficits. Extremities: Symmetric 5 x 5 power. Skin: No rashes, lesions or ulcers Psychiatry: Judgement and insight  appear normal. Mood & affect appropriate.     Data Reviewed: I have personally reviewed following labs and imaging studies  CBC: Recent Labs  Lab 08/29/18 1052 08/30/18 1232  WBC 14.4* 10.0  NEUTROABS 11.2* 8.7*  HGB 10.6* 10.4*  HCT 32.3* 32.6*  MCV 87.2 89.1  PLT 217.0 299   Basic Metabolic Panel: Recent Labs  Lab 08/29/18 1052 08/30/18 1232  NA 138 139  K 3.3* 3.8  CL 99 100  CO2 28 25  GLUCOSE 91 92  BUN 17 13  CREATININE 1.02 0.92  CALCIUM 8.3* 8.8*   GFR: Estimated Creatinine Clearance: 64.5 mL/min (by C-G formula based on SCr of 0.92 mg/dL). Liver Function Tests: Recent Labs  Lab 08/29/18 1052 08/30/18 1232  AST 11 17  ALT 19 23  ALKPHOS 42 48  BILITOT 0.5 0.7  PROT 6.2 6.9  ALBUMIN 3.3* 3.2*   No results for input(s): LIPASE, AMYLASE in the last 168 hours. No results for input(s): AMMONIA in the last 168 hours. Coagulation Profile: No results for input(s): INR, PROTIME in the last 168 hours. Cardiac Enzymes: Recent Labs  Lab 08/30/18 1232 08/30/18 1818 08/31/18 0019  TROPONINI 0.05* 0.05* 0.04*   BNP (last 3 results) Recent Labs  08/29/18 1052  PROBNP 96.0   HbA1C: No results for input(s): HGBA1C in the last 72 hours. CBG: No results for input(s): GLUCAP in the last 168 hours. Lipid Profile: No results for input(s): CHOL, HDL, LDLCALC, TRIG, CHOLHDL, LDLDIRECT in the last 72 hours. Thyroid Function Tests: Recent Labs    08/29/18 1052  TSH 6.11*   Anemia Panel: No results for input(s): VITAMINB12, FOLATE, FERRITIN, TIBC, IRON, RETICCTPCT in the last 72 hours. Sepsis Labs: No results for input(s): PROCALCITON, LATICACIDVEN in the last 168 hours.  Recent Results (from the past 240 hour(s))  Blood Culture (routine x 2)     Status: None   Collection Time: 08/22/18  5:30 PM  Result Value Ref Range Status   Specimen Description   Final    BLOOD RIGHT ANTECUBITAL Performed at [facility], [address] [place], [place] [address]    Special Requests   Final    BOTTLES DRAWN AEROBIC AND ANAEROBIC Blood Culture adequate volume Performed at [facility], [address] [place], [place] [address]    Culture   Final    NO GROWTH 5 DAYS Performed at [facility], [place] [address], [place], [place] [address]    Report Status 08/27/2018 FINAL  Final  Blood Culture (routine x 2)     Status: None   Collection Time: 08/22/18  6:00 PM  Result Value Ref Range Status   Specimen Description   Final    BLOOD BLOOD RIGHT FOREARM Performed at [facility], [address] [place], [place] [address]    Special Requests   Final    BOTTLES DRAWN AEROBIC AND ANAEROBIC Blood Culture adequate volume Performed at [facility], [address] [place], [place] [address]    Culture   Final    NO GROWTH 5 DAYS Performed at [facility], [place] [address], [place], [place] [address]    Report Status 08/27/2018 FINAL  Final  SARS Coronavirus 2 (CEPHEID- Performed in [facility] lab), Hosp Order     Status: None   Collection Time: 08/30/18 11:36 AM  Result Value Ref Range Status   SARS Coronavirus 2 NEGATIVE NEGATIVE Final    Comment: (NOTE) If result is NEGATIVE SARS-CoV-2 target nucleic acids are NOT DETECTED. The SARS-CoV-2 RNA is generally detectable in upper and lower  respiratory specimens during the acute phase of infection. The lowest  concentration of SARS-CoV-2 viral copies this assay can detect is 250  copies / mL. A negative result does not preclude SARS-CoV-2 infection  and should not be used as the sole basis for treatment or other  patient management decisions.  A negative result may occur with  improper specimen collection / handling, submission of specimen other  than nasopharyngeal swab, presence of viral mutation(s) within the  areas targeted by this assay, and inadequate number of viral copies  (<250 copies / mL). A negative result must be  combined with clinical  observations, patient history, and epidemiological information. If result is POSITIVE SARS-CoV-2 target nucleic acids are DETECTED. The SARS-CoV-2 RNA is generally detectable in upper and lower  respiratory specimens dur ing the acute phase of infection.  Positive  results are indicative of active infection with SARS-CoV-2.  Clinical  correlation with patient history and other diagnostic information is  necessary to determine patient infection status.  Positive results do  not rule out bacterial infection or co-infection with other  viruses. If result is PRESUMPTIVE POSTIVE SARS-CoV-2 nucleic acids MAY BE PRESENT.   A presumptive positive result was obtained on the submitted specimen  and confirmed on repeat testing.  While 2019 novel coronavirus  (SARS-CoV-2) nucleic acids may be present in the submitted sample  additional confirmatory testing may be necessary for epidemiological  and / or clinical management purposes  to differentiate between  SARS-CoV-2 and other Sarbecovirus currently known to infect humans.  If clinically indicated additional testing with an alternate test  methodology [phone]) is advised. The SARS-CoV-2 RNA is generally  detectable in upper and lower respiratory sp ecimens during the acute  phase of infection. The expected result is Negative. Fact Sheet for Patients:  StrictlyIdeas.no Fact Sheet for Healthcare Providers: BankingDealers.co.za This test is not yet approved or cleared by the [place] FDA and has been authorized for detection and/or diagnosis of SARS-CoV-2 by FDA under an Emergency Use Authorization (EUA).  This EUA will remain in effect (meaning this test can be used) for the duration of the COVID-19 declaration under Section 564(b)(1) of the Act, 21 U.S.C. section 360bbb-3(b)(1), unless the authorization is terminated or revoked sooner. Performed at [facility],  [address] [place], [place] [address]   Blood culture (routine x 2)     Status: None (Preliminary result)   Collection Time: 08/30/18 12:32 PM  Result Value Ref Range Status   Specimen Description LEFT ANTECUBITAL  Final   Special Requests   Final    BOTTLES DRAWN AEROBIC AND ANAEROBIC Blood Culture adequate volume Performed at [facility], [address] [place], [place] [address]    Culture PENDING  Incomplete   Report Status PENDING  Incomplete  Blood culture (routine x 2)     Status: None (Preliminary result)   Collection Time: 08/30/18 12:32 PM  Result Value Ref Range Status   Specimen Description BLOOD RIGHT FOREARM  Final   Special Requests   Final    BOTTLES DRAWN AEROBIC AND ANAEROBIC Blood Culture adequate volume Performed at [facility], [address] [place], [place] [address]    Culture PENDING  Incomplete   Report Status PENDING  Incomplete  MRSA PCR Screening     Status: None   Collection Time: 08/30/18  5:33 PM  Result Value Ref Range Status   MRSA by PCR NEGATIVE NEGATIVE Final    Comment:        The GeneXpert MRSA Assay (FDA approved for NASAL specimens only), is one component of a comprehensive MRSA colonization surveillance program. It is not intended to diagnose MRSA infection nor to guide or monitor treatment for MRSA infections. Performed at [facility], [address] [place],  [address]          Radiology Studies: Dg Chest 2 View  Result Date: 08/29/2018 CLINICAL DATA:  Dyspnea on exertion. EXAM: CHEST - 2 VIEW COMPARISON:  08/21/2018 and CT 08/22/2018 FINDINGS: Lungs are hypoinflated demonstrate bilateral coarse interstitial disease compatible with known fibrosis. No new airspace process or effusion. Cardiomediastinal silhouette and remainder of the exam is unchanged. IMPRESSION: No acute findings. Stable bilateral interstitial disease. Electronically Signed   By: Marin Olp M.D.   On: 08/29/2018 15:30   Ct Angio Chest Pe W And/or  Wo Contrast  Result Date: 08/30/2018 CLINICAL DATA:  73 year old male with shortness of breath EXAM: CT ANGIOGRAPHY CHEST WITH CONTRAST TECHNIQUE: Multidetector CT imaging of the chest was performed using the standard protocol during bolus administration of intravenous contrast. Multiplanar CT image reconstructions  and MIPs were obtained to evaluate the vascular anatomy. CONTRAST:  169mL OMNIPAQUE IOHEXOL 350 MG/ML SOLN COMPARISON:  CT 08/22/2018, 08/09/2018 FINDINGS: Cardiovascular: Heart: Similar appearance of the heart size with cardiomegaly. No pericardial fluid/thickening . Calcifications of left main, left anterior descending, right coronary arteries. Aorta: Unremarkable course, caliber, contour of the thoracic aorta. No aneurysm or dissection flap. No periaortic fluid. Calcifications of the aortic arch. Pulmonary arteries: No central, lobar, segmental, or proximal subsegmental filling defects. Main pulmonary diameter measures 3.0 cm Mediastinum/Nodes: No mediastinal adenopathy. Unremarkable appearance of the thoracic esophagus. Unremarkable thoracic inlet Lungs/Pleura: Redemonstration of background of centrilobular and paraseptal emphysema with architectural distortion and a pattern of subpleural reticulation/honeycomb pattern of the bilateral lungs, predominance of the lower lobes. Interlobular septal thickening. New trace left pleural fluid compared to the prior CT. Slight increased right pleural fluid at the fissure compared to the prior CT. No endotracheal or endobronchial debris. Bronchial wall thickening is similar to prior. Overall, there is slight increased interstitial opacities. Upper Abdomen: No acute. Musculoskeletal: No acute displaced fracture. Degenerative changes of the spine. Review of the MIP images confirms the above findings. IMPRESSION: Redemonstration of advanced interstitial fibrosis, with likely superimposed acute bronchitis/atypical infection given that there is slight increased  prominence of the interstitial opacities, enlarging right pleural effusion, and new left pleural effusion. No evidence of overt edema. Aortic atherosclerosis and coronary artery disease. Aortic Atherosclerosis (ICD10-I70.0). Electronically Signed   By: Corrie Mckusick D.O.   On: 08/30/2018 15:31   Dg Chest Portable 1 View  Result Date: 08/30/2018 CLINICAL DATA:  Shortness of breath. History of smoking and pulmonary fibrosis. EXAM: PORTABLE CHEST 1 VIEW COMPARISON:  08/29/2018 FINDINGS: The cardiomediastinal silhouette is unchanged. Aortic atherosclerosis is noted. Lung volumes remain mildly low with chronic coarse interstitial opacities again noted throughout both lungs. No definite acute airspace opacity is identified allowing for portable AP technique today compared to two view upright imaging yesterday. No sizable pleural effusion or pneumothorax is identified. No acute osseous abnormality is seen. IMPRESSION: Chronic interstitial lung disease without evidence of acute abnormality. Electronically Signed   By: Logan Bores M.D.   On: 08/30/2018 12:24        Scheduled Meds:  atorvastatin  10 mg Oral QHS   enoxaparin (LOVENOX) injection  40 mg Subcutaneous Q24H   escitalopram  10 mg Oral Daily   folic acid  1 mg Oral Daily   guaiFENesin-dextromethorphan  10 mL Oral Q8H   ipratropium-albuterol  3 mL Nebulization Q6H   methylPREDNISolone (SOLU-MEDROL) injection  60 mg Intravenous Q12H   Continuous Infusions:  sodium chloride 5 mL/hr at 08/31/18 0603   azithromycin Stopped (08/30/18 1840)     LOS: 0 days    Time spent: 30 minutes    Marcella Dunnaway Darleen Crocker, DO [facility] Pager [phone]  If 7PM-7AM, please contact night-coverage [email or web address] Password [facility] 08/31/2018, 7:13 AM

## 2018-08-31 NOTE — Telephone Encounter (Signed)
Copied from [place] [id number]. Topic: Appointment Scheduling - Scheduling Inquiry for Clinic >> Aug 31, 2018 10:19 AM Yvette Rack wrote: Reason for CRM: Barbaraann Rondo with [facility] hospital called in to schedule a hospital follow up visit for the patient. Attempted to transfer to the office but there was no answer after several attempts. Barbaraann Rondo requests a call back to schedule the appt. Cb# [phone] Barbaraann Rondo stated patient should be discharged over the weekend.

## 2018-09-01 DIAGNOSIS — I5033 Acute on chronic diastolic (congestive) heart failure: Secondary | ICD-10-CM | POA: Diagnosis present

## 2018-09-01 DIAGNOSIS — Z515 Encounter for palliative care: Secondary | ICD-10-CM | POA: Diagnosis not present

## 2018-09-01 DIAGNOSIS — E876 Hypokalemia: Secondary | ICD-10-CM | POA: Diagnosis present

## 2018-09-01 DIAGNOSIS — K219 Gastro-esophageal reflux disease without esophagitis: Secondary | ICD-10-CM | POA: Diagnosis present

## 2018-09-01 DIAGNOSIS — D649 Anemia, unspecified: Secondary | ICD-10-CM | POA: Diagnosis present

## 2018-09-01 DIAGNOSIS — Z87891 Personal history of nicotine dependence: Secondary | ICD-10-CM | POA: Diagnosis not present

## 2018-09-01 DIAGNOSIS — I110 Hypertensive heart disease with heart failure: Secondary | ICD-10-CM | POA: Diagnosis present

## 2018-09-01 DIAGNOSIS — M7989 Other specified soft tissue disorders: Secondary | ICD-10-CM | POA: Diagnosis present

## 2018-09-01 DIAGNOSIS — J8410 Pulmonary fibrosis, unspecified: Secondary | ICD-10-CM | POA: Diagnosis not present

## 2018-09-01 DIAGNOSIS — Z7951 Long term (current) use of inhaled steroids: Secondary | ICD-10-CM | POA: Diagnosis not present

## 2018-09-01 DIAGNOSIS — Z8261 Family history of arthritis: Secondary | ICD-10-CM | POA: Diagnosis not present

## 2018-09-01 DIAGNOSIS — Z77090 Contact with and (suspected) exposure to asbestos: Secondary | ICD-10-CM | POA: Diagnosis present

## 2018-09-01 DIAGNOSIS — Z8701 Personal history of pneumonia (recurrent): Secondary | ICD-10-CM | POA: Diagnosis not present

## 2018-09-01 DIAGNOSIS — Z8546 Personal history of malignant neoplasm of prostate: Secondary | ICD-10-CM | POA: Diagnosis not present

## 2018-09-01 DIAGNOSIS — Z20828 Contact with and (suspected) exposure to other viral communicable diseases: Secondary | ICD-10-CM | POA: Diagnosis present

## 2018-09-01 DIAGNOSIS — C61 Malignant neoplasm of prostate: Secondary | ICD-10-CM | POA: Diagnosis not present

## 2018-09-01 DIAGNOSIS — E785 Hyperlipidemia, unspecified: Secondary | ICD-10-CM | POA: Diagnosis present

## 2018-09-01 DIAGNOSIS — R0603 Acute respiratory distress: Secondary | ICD-10-CM | POA: Diagnosis present

## 2018-09-01 DIAGNOSIS — J9621 Acute and chronic respiratory failure with hypoxia: Secondary | ICD-10-CM | POA: Diagnosis not present

## 2018-09-01 DIAGNOSIS — Z8611 Personal history of tuberculosis: Secondary | ICD-10-CM | POA: Diagnosis not present

## 2018-09-01 DIAGNOSIS — Z7952 Long term (current) use of systemic steroids: Secondary | ICD-10-CM | POA: Diagnosis not present

## 2018-09-01 DIAGNOSIS — M05742 Rheumatoid arthritis with rheumatoid factor of left hand without organ or systems involvement: Secondary | ICD-10-CM | POA: Diagnosis not present

## 2018-09-01 DIAGNOSIS — J441 Chronic obstructive pulmonary disease with (acute) exacerbation: Secondary | ICD-10-CM | POA: Diagnosis not present

## 2018-09-01 DIAGNOSIS — Z8249 Family history of ischemic heart disease and other diseases of the circulatory system: Secondary | ICD-10-CM | POA: Diagnosis not present

## 2018-09-01 DIAGNOSIS — J449 Chronic obstructive pulmonary disease, unspecified: Secondary | ICD-10-CM | POA: Diagnosis present

## 2018-09-01 DIAGNOSIS — M05741 Rheumatoid arthritis with rheumatoid factor of right hand without organ or systems involvement: Secondary | ICD-10-CM | POA: Diagnosis not present

## 2018-09-01 DIAGNOSIS — M069 Rheumatoid arthritis, unspecified: Secondary | ICD-10-CM | POA: Diagnosis present

## 2018-09-01 DIAGNOSIS — Z9981 Dependence on supplemental oxygen: Secondary | ICD-10-CM | POA: Diagnosis not present

## 2018-09-01 DIAGNOSIS — I248 Other forms of acute ischemic heart disease: Secondary | ICD-10-CM | POA: Diagnosis present

## 2018-09-01 DIAGNOSIS — H9191 Unspecified hearing loss, right ear: Secondary | ICD-10-CM | POA: Diagnosis present

## 2018-09-01 LAB — CBC
HCT: 28.1 % — ABNORMAL LOW (ref 39.0–52.0)
Hemoglobin: 9.1 g/dL — ABNORMAL LOW (ref 13.0–17.0)
MCH: 28.7 pg (ref 26.0–34.0)
MCHC: 32.4 g/dL (ref 30.0–36.0)
MCV: 88.6 fL (ref 80.0–100.0)
Platelets: 230 10*3/uL (ref 150–400)
RBC: 3.17 MIL/uL — ABNORMAL LOW (ref 4.22–5.81)
RDW: 18.1 % — ABNORMAL HIGH (ref 11.5–15.5)
WBC: 13.2 10*3/uL — ABNORMAL HIGH (ref 4.0–10.5)
nRBC: 0 % (ref 0.0–0.2)

## 2018-09-01 LAB — BASIC METABOLIC PANEL
Anion gap: 11 (ref 5–15)
BUN: 19 mg/dL (ref 8–23)
CO2: 27 mmol/L (ref 22–32)
Calcium: 8.3 mg/dL — ABNORMAL LOW (ref 8.9–10.3)
Chloride: 102 mmol/L (ref 98–111)
Creatinine, Ser: 0.95 mg/dL (ref 0.61–1.24)
GFR calc Af Amer: >60 mL/min (ref >60)
GFR calc non Af Amer: >60 mL/min (ref >60)
Glucose, Bld: 158 mg/dL — ABNORMAL HIGH (ref 70–99)
Potassium: 2.9 mmol/L — ABNORMAL LOW (ref 3.5–5.1)
Sodium: 140 mmol/L (ref 135–145)

## 2018-09-01 LAB — MAGNESIUM: Magnesium: 2 mg/dL (ref 1.7–2.4)

## 2018-09-01 MED ORDER — LISINOPRIL 10 MG PO TABS
10.0000 mg | ORAL_TABLET | Freq: Every day | ORAL | Status: DC
  Administered 2018-09-01 – 2018-09-06 (×6): 10 mg via ORAL
  Filled 2018-09-01 (×6): qty 1

## 2018-09-01 MED ORDER — POTASSIUM CHLORIDE CRYS ER 20 MEQ PO TBCR
40.0000 meq | EXTENDED_RELEASE_TABLET | Freq: Three times a day (TID) | ORAL | Status: AC
  Administered 2018-09-01 (×3): 40 meq via ORAL
  Filled 2018-09-01 (×3): qty 2

## 2018-09-01 MED ORDER — HYDROCODONE-ACETAMINOPHEN 5-325 MG PO TABS
1.0000 | ORAL_TABLET | Freq: Four times a day (QID) | ORAL | Status: DC | PRN
  Administered 2018-09-02 – 2018-09-06 (×11): 1 via ORAL
  Filled 2018-09-01 (×11): qty 1

## 2018-09-01 NOTE — Progress Notes (Signed)
PROGRESS NOTE    Joel Valdez  YIA:[id number] DOB: [birth year] DOA: 08/30/2018 PCP: [name], PA-C   Brief Narrative:  Per HPI: Joel Valdez a 73 y.o.malewith medical history significant forinterstitial lung diseasechronic respiratory failure, hypertension, rheumatoid arthritis,immunosuppression,who presented to the ED with complaints of difficulty breathing started about 3 hours prior to arrival in the ED. Reports worsening of his chronic cough also, buthis cough over the past 3 weeks has been nonproductive. Reports worsening lower extremity swelling R>L,without pain redness or warmth. No fever or chills. Also reports onset of left-sided chest pain that started this morningas hewoke up. Pain is radiating, worse with movement. Patient reports pain at this time improved after he was given some medications in the ED (aspirin). Reports chest pain is similar to chest pain he had recently requiring hospitalization. During her tele-visit with his primary care provider today, patient was winded on interview, withpatient's O2 at that time on 4.5 L was 96%,but later dropped to 77%. EMS gave 1 rate 25 mg of Solu-Medrol prior to arrival in the ED  Patient was admitted with acute on chronic hypoxemic respiratory failure likely secondary to acute bronchitis/atypical infection with possible associated flare of his advanced interstitial fibrosis.  He is also noted to have bilateral pleural effusions and has been started on IV Solu-Medrol as well as DuoNebs and azithromycin along with Lasix.  He was notably recently hospitalized for multifocal pneumonia in early April and was intubated at that time and had a complication of small pneumothorax which was treated with chest tube.  He was also noted to have chest pain later in April with unremarkable echocardiogram and was discharged home on 4 to 6 L of oxygen.  Patient has no evidence of PE on CTA chest and right lower extremity  Doppler negative for any acute findings.  He has diuresed well overnight on 5/8 and is noted to have some hypokalemia on the morning of 5/9.  Requesting SNF/rehab on discharge.   Assessment & Plan:   Active Problems:   Acute on chronic respiratory failure (HCC)   Acute on chronichypoxicrespiratory failure-increasing dyspnea,worsening drycough,initial hypoxemia reported at home 77% O2 sats on 4.5 L,the time of my evaluation O2 sats greater than 90% on 3.5 L.Likely secondary to acute bronchitis/typical infectionwithpossibleflare of his advanced interstitial fibrosis. As suggested on CTAchest.Also new trace left pleural effusion, on my view his right pleural effusion reported as enlarging is Small and not enough to account forthisacute worsening of his respiratory status.125 mg Solu-Medrol given by EMS. SARS-CoV-2 test negative. -Continue IV Solu-Medrol 60 mg twice daily for now and wean O2 -Leukocytosis may be related to steroids.  Procalcitonin and respiratory panel negative -IV azithromycin to continue for now per pulmonology -Respiratory protocol, supplemental O2 - Obtain ABG-pH 7.4, PCO2 33, PaO2 66.9 - Mucolytics,duo nebs scheduled, albuterol as needed - Pulmology consult appreciated  -Patient has had -1.3 L fluid balance with 40 of IV Lasix administered.  Will avoid further Lasix for today given hypokalemia.  Chest pain-atypical, appears musculoskeletal.Recentadmission for same ruled out with EKG and troponins with echo. Mild elevated troponin-0.05,likelydemand 2/2acute respiratory distress. EKG unchanged from prior. -EKG with normal sinus rhythm and no changes consistent with ACS noted this a.m. -Flat troponin trend noted.  No suspicion for ACS.  Likely demand ischemia. -Received Toradol yesterday with improvement noted.  Right lower extremity swelling-history of cor pulmonale.Recent echo 07/2018-EF>65%. -Home Medication Lasix 20 mg will hold for now  given hypokalemia. - RightLower extremity venous Dopplers  negative for DVT  Interstitial lung disease flare- Likelycontributing to his acute on chronic respiratory failure. Currently on 3.5 L at home with sats greater than 90%.Follows with Dr. Melvyn Novas.  Rheumatoidarthritis-appears stable. Home medications methotrexate andleuprolide injections every 3 months.  Also appears to be on prednisone.  Hypertension-bloodpressuressystolic soft tostable.Norvasc recently d/c'd by PCP. -Blood pressure stable and improved this a.m.  Will restart home lisinopril today.  Hypokalemia -Replete orally today and avoid Lasix -Recheck in a.m. along with magnesium   DVT prophylaxis: Lovenox Code Status: Full Family Communication: None at bedside, will plan to call wife to update later Disposition Plan: Per pulmonology recommendations.  Avoid further diuresis today and plan to replete potassium and recheck in a.m.  Wean oxygen as tolerated.  PT evaluation pending.   Consultants:   Pulmonology  Procedures:   None  Antimicrobials:   IV azithromycin 5/7->  Subjective: Patient seen and evaluated today with no new acute complaints or concerns. No acute concerns or events noted overnight.  His chest pain has improved, but he continues to remain on high flow nasal cannula at 15 L.  He has diuresed quite well with IV Lasix overnight.  Objective: Vitals:   09/01/18 0400 09/01/18 0411 09/01/18 0500 09/01/18 0600  BP: 119/70  135/78 122/69  Pulse: 88  (!) 103 86  Resp: (!) 23  15 18   Temp:  97.7 F (36.5 C)    TempSrc:  Axillary    SpO2: 99%  98% 99%  Weight:  68 kg    Height:        Intake/Output Summary (Last 24 hours) at 09/01/2018 0715 Last data filed at 08/31/2018 1815 Gross per 24 hour  Intake 284.76 ml  Output 1600 ml  Net -1315.24 ml   Filed Weights   08/30/18 1136 08/31/18 0646 09/01/18 0411  Weight: 69.9 kg 69.2 kg 68 kg    Examination:  General exam: Appears  calm and comfortable  Respiratory system: Clear to auscultation. Respiratory effort normal.  15 L nasal cannula Cardiovascular system: S1 & S2 heard, RRR. No JVD, murmurs, rubs, gallops or clicks. No pedal edema. Gastrointestinal system: Abdomen is nondistended, soft and nontender. No organomegaly or masses felt. Normal bowel sounds heard. Central nervous system: Alert and oriented. No focal neurological deficits. Extremities: Symmetric 5 x 5 power. Skin: No rashes, lesions or ulcers Psychiatry: Judgement and insight appear normal. Mood & affect appropriate.     Data Reviewed: I have personally reviewed following labs and imaging studies  CBC: Recent Labs  Lab 08/29/18 1052 08/30/18 1232 09/01/18 0439  WBC 14.4* 10.0 13.2*  NEUTROABS 11.2* 8.7*  --   HGB 10.6* 10.4* 9.1*  HCT 32.3* 32.6* 28.1*  MCV 87.2 89.1 88.6  PLT 217.0 202 992   Basic Metabolic Panel: Recent Labs  Lab 08/29/18 1052 08/30/18 1232 09/01/18 0439  NA 138 139 140  K 3.3* 3.8 2.9*  CL 99 100 102  CO2 28 25 27   GLUCOSE 91 92 158*  BUN 17 13 19   CREATININE 1.02 0.92 0.95  CALCIUM 8.3* 8.8* 8.3*  MG  --   --  2.0   GFR: Estimated Creatinine Clearance: 62.5 mL/min (by C-G formula based on SCr of 0.95 mg/dL). Liver Function Tests: Recent Labs  Lab 08/29/18 1052 08/30/18 1232  AST 11 17  ALT 19 23  ALKPHOS 42 48  BILITOT 0.5 0.7  PROT 6.2 6.9  ALBUMIN 3.3* 3.2*   No results for input(s): LIPASE, AMYLASE in the last 168 hours.  No results for input(s): AMMONIA in the last 168 hours. Coagulation Profile: No results for input(s): INR, PROTIME in the last 168 hours. Cardiac Enzymes: Recent Labs  Lab 08/30/18 1232 08/30/18 1818 08/31/18 0019  TROPONINI 0.05* 0.05* 0.04*   BNP (last 3 results) Recent Labs    08/29/18 1052  PROBNP 96.0   HbA1C: No results for input(s): HGBA1C in the last 72 hours. CBG: No results for input(s): GLUCAP in the last 168 hours. Lipid Profile: No results for  input(s): CHOL, HDL, LDLCALC, TRIG, CHOLHDL, LDLDIRECT in the last 72 hours. Thyroid Function Tests: Recent Labs    08/29/18 1052  TSH 6.11*   Anemia Panel: No results for input(s): VITAMINB12, FOLATE, FERRITIN, TIBC, IRON, RETICCTPCT in the last 72 hours. Sepsis Labs: Recent Labs  Lab 08/31/18 0019  PROCALCITON 0.15    Recent Results (from the past 240 hour(s))  Blood Culture (routine x 2)     Status: None   Collection Time: 08/22/18  5:30 PM  Result Value Ref Range Status   Specimen Description   Final    BLOOD RIGHT ANTECUBITAL Performed at [facility], [address] [place], [place] [address]    Special Requests   Final    BOTTLES DRAWN AEROBIC AND ANAEROBIC Blood Culture adequate volume Performed at [facility], [address] [place], [place] [address]    Culture   Final    NO GROWTH 5 DAYS Performed at [facility], [place] [address], [place], [place] [address]    Report Status 08/27/2018 FINAL  Final  Blood Culture (routine x 2)     Status: None   Collection Time: 08/22/18  6:00 PM  Result Value Ref Range Status   Specimen Description   Final    BLOOD BLOOD RIGHT FOREARM Performed at [facility], [address] [place], [place] [address]    Special Requests   Final    BOTTLES DRAWN AEROBIC AND ANAEROBIC Blood Culture adequate volume Performed at [facility], [address] [place], [place] [address]    Culture   Final    NO GROWTH 5 DAYS Performed at [facility], [place] [address], [place], [place] [address]    Report Status 08/27/2018 FINAL  Final  SARS Coronavirus 2 (CEPHEID- Performed in [place] hospital lab), Hosp Order     Status: None   Collection Time: 08/30/18 11:36 AM  Result Value Ref Range Status   SARS Coronavirus 2 NEGATIVE NEGATIVE Final    Comment: (NOTE) If result is NEGATIVE SARS-CoV-2 target nucleic acids are NOT DETECTED. The SARS-CoV-2 RNA is generally detectable in  upper and lower  respiratory specimens during the acute phase of infection. The lowest  concentration of SARS-CoV-2 viral copies this assay can detect is 250  copies / mL. A negative result does not preclude SARS-CoV-2 infection  and should not be used as the sole basis for treatment or other  patient management decisions.  A negative result may occur with  improper specimen collection / handling, submission of specimen other  than nasopharyngeal swab, presence of viral mutation(s) within the  areas targeted by this assay, and inadequate number of viral copies  (<250 copies / mL). A negative result must be combined with clinical  observations, patient history, and epidemiological information. If result is POSITIVE SARS-CoV-2 target nucleic acids are DETECTED. The SARS-CoV-2 RNA is generally detectable in upper and lower  respiratory specimens  dur ing the acute phase of infection.  Positive  results are indicative of active infection with SARS-CoV-2.  Clinical  correlation with patient history and other diagnostic information is  necessary to determine patient infection status.  Positive results do  not rule out bacterial infection or co-infection with other viruses. If result is PRESUMPTIVE POSTIVE SARS-CoV-2 nucleic acids MAY BE PRESENT.   A presumptive positive result was obtained on the submitted specimen  and confirmed on repeat testing.  While 2019 novel coronavirus  (SARS-CoV-2) nucleic acids may be present in the submitted sample  additional confirmatory testing may be necessary for epidemiological  and / or clinical management purposes  to differentiate between  SARS-CoV-2 and other Sarbecovirus currently known to infect humans.  If clinically indicated additional testing with an alternate test  methodology [phone]) is advised. The SARS-CoV-2 RNA is generally  detectable in upper and lower respiratory sp ecimens during the acute  phase of infection. The expected result is  Negative. Fact Sheet for Patients:  StrictlyIdeas.no Fact Sheet for Healthcare Providers: BankingDealers.co.za This test is not yet approved or cleared by the [place] FDA and has been authorized for detection and/or diagnosis of SARS-CoV-2 by FDA under an Emergency Use Authorization (EUA).  This EUA will remain in effect (meaning this test can be used) for the duration of the COVID-19 declaration under Section 564(b)(1) of the Act, 21 U.S.C. section 360bbb-3(b)(1), unless the authorization is terminated or revoked sooner. Performed at [facility], [address] [place], [place] [address]   Blood culture (routine x 2)     Status: None (Preliminary result)   Collection Time: 08/30/18 12:32 PM  Result Value Ref Range Status   Specimen Description LEFT ANTECUBITAL  Final   Special Requests   Final    BOTTLES DRAWN AEROBIC AND ANAEROBIC Blood Culture adequate volume   Culture   Final    NO GROWTH 2 DAYS Performed at [facility], [address], [place], [place] [address]    Report Status PENDING  Incomplete  Blood culture (routine x 2)     Status: None (Preliminary result)   Collection Time: 08/30/18 12:32 PM  Result Value Ref Range Status   Specimen Description BLOOD RIGHT FOREARM  Final   Special Requests   Final    BOTTLES DRAWN AEROBIC AND ANAEROBIC Blood Culture adequate volume   Culture   Final    NO GROWTH 2 DAYS Performed at [facility], [address] [place], [place] [address]    Report Status PENDING  Incomplete  MRSA PCR Screening     Status: None   Collection Time: 08/30/18  5:33 PM  Result Value Ref Range Status   MRSA by PCR NEGATIVE NEGATIVE Final    Comment:        The GeneXpert MRSA Assay (FDA approved for NASAL specimens only), is one component of a comprehensive MRSA colonization surveillance program. It is not intended to diagnose MRSA infection nor to guide or monitor treatment for MRSA  infections. Performed at [facility], [address] [place], [place] [address]   Respiratory Panel by PCR     Status: None   Collection Time: 08/31/18  7:32 AM  Result Value Ref Range Status   Adenovirus NOT DETECTED NOT DETECTED Final   Coronavirus 229E NOT DETECTED NOT DETECTED Final    Comment: (NOTE) The Coronavirus on the Respiratory Panel, DOES NOT test for the novel  Coronavirus (2019 nCoV)    Coronavirus HKU1 NOT DETECTED NOT DETECTED  Final   Coronavirus NL63 NOT DETECTED NOT DETECTED Final   Coronavirus OC43 NOT DETECTED NOT DETECTED Final   Metapneumovirus NOT DETECTED NOT DETECTED Final   Rhinovirus / Enterovirus NOT DETECTED NOT DETECTED Final   Influenza A NOT DETECTED NOT DETECTED Final   Influenza B NOT DETECTED NOT DETECTED Final   Parainfluenza Virus 1 NOT DETECTED NOT DETECTED Final   Parainfluenza Virus 2 NOT DETECTED NOT DETECTED Final   Parainfluenza Virus 3 NOT DETECTED NOT DETECTED Final   Parainfluenza Virus 4 NOT DETECTED NOT DETECTED Final   Respiratory Syncytial Virus NOT DETECTED NOT DETECTED Final   Bordetella pertussis NOT DETECTED NOT DETECTED Final   Chlamydophila pneumoniae NOT DETECTED NOT DETECTED Final   Mycoplasma pneumoniae NOT DETECTED NOT DETECTED Final    Comment: Performed at [facility], [place] [address] [place],  [address]         Radiology Studies: Ct Angio Chest Pe W And/or Wo Contrast  Result Date: 08/30/2018 CLINICAL DATA:  73 year old male with shortness of breath EXAM: CT ANGIOGRAPHY CHEST WITH CONTRAST TECHNIQUE: Multidetector CT imaging of the chest was performed using the standard protocol during bolus administration of intravenous contrast. Multiplanar CT image reconstructions and MIPs were obtained to evaluate the vascular anatomy. CONTRAST:  1110mL OMNIPAQUE IOHEXOL 350 MG/ML SOLN COMPARISON:  CT 08/22/2018, 08/09/2018 FINDINGS: Cardiovascular: Heart: Similar appearance of the heart size with cardiomegaly. No  pericardial fluid/thickening . Calcifications of left main, left anterior descending, right coronary arteries. Aorta: Unremarkable course, caliber, contour of the thoracic aorta. No aneurysm or dissection flap. No periaortic fluid. Calcifications of the aortic arch. Pulmonary arteries: No central, lobar, segmental, or proximal subsegmental filling defects. Main pulmonary diameter measures 3.0 cm Mediastinum/Nodes: No mediastinal adenopathy. Unremarkable appearance of the thoracic esophagus. Unremarkable thoracic inlet Lungs/Pleura: Redemonstration of background of centrilobular and paraseptal emphysema with architectural distortion and a pattern of subpleural reticulation/honeycomb pattern of the bilateral lungs, predominance of the lower lobes. Interlobular septal thickening. New trace left pleural fluid compared to the prior CT. Slight increased right pleural fluid at the fissure compared to the prior CT. No endotracheal or endobronchial debris. Bronchial wall thickening is similar to prior. Overall, there is slight increased interstitial opacities. Upper Abdomen: No acute. Musculoskeletal: No acute displaced fracture. Degenerative changes of the spine. Review of the MIP images confirms the above findings. IMPRESSION: Redemonstration of advanced interstitial fibrosis, with likely superimposed acute bronchitis/atypical infection given that there is slight increased prominence of the interstitial opacities, enlarging right pleural effusion, and new left pleural effusion. No evidence of overt edema. Aortic atherosclerosis and coronary artery disease. Aortic Atherosclerosis (ICD10-I70.0). Electronically Signed   By: Corrie Mckusick D.O.   On: 08/30/2018 15:31   US Venous Img Lower Unilateral Right  Result Date: 08/31/2018 CLINICAL DATA:  Right lower extremity edema. History of prostate cancer. Evaluate for DVT. EXAM: RIGHT LOWER EXTREMITY VENOUS DOPPLER ULTRASOUND TECHNIQUE: Gray-scale sonography with graded  compression, as well as color Doppler and duplex ultrasound were performed to evaluate the lower extremity deep venous systems from the level of the common femoral vein and including the common femoral, femoral, profunda femoral, popliteal and calf veins including the posterior tibial, peroneal and gastrocnemius veins when visible. The superficial great saphenous vein was also interrogated. Spectral Doppler was utilized to evaluate flow at rest and with distal augmentation maneuvers in the common femoral, femoral and popliteal veins. COMPARISON:  None. FINDINGS: Contralateral Common Femoral Vein: Respiratory phasicity is normal and symmetric with the symptomatic  side. No evidence of thrombus. Normal compressibility. Common Femoral Vein: No evidence of thrombus. Normal compressibility, respiratory phasicity and response to augmentation. Saphenofemoral Junction: No evidence of thrombus. Normal compressibility and flow on color Doppler imaging. Profunda Femoral Vein: No evidence of thrombus. Normal compressibility and flow on color Doppler imaging. Femoral Vein: No evidence of thrombus. Normal compressibility, respiratory phasicity and response to augmentation. Popliteal Vein: No evidence of thrombus. Normal compressibility, respiratory phasicity and response to augmentation. Calf Veins: No evidence of thrombus. Normal compressibility and flow on color Doppler imaging. Superficial Great Saphenous Vein: No evidence of thrombus. Normal compressibility. Venous Reflux:  None. Other Findings: A minimal subcutaneous edema is noted at the level right lower leg and calf. IMPRESSION: No evidence DVT within the right lower extremity. Electronically Signed   By: Sandi Mariscal M.D.   On: 08/31/2018 09:19   Dg Chest Portable 1 View  Result Date: 08/30/2018 CLINICAL DATA:  Shortness of breath. History of smoking and pulmonary fibrosis. EXAM: PORTABLE CHEST 1 VIEW COMPARISON:  08/29/2018 FINDINGS: The cardiomediastinal silhouette is  unchanged. Aortic atherosclerosis is noted. Lung volumes remain mildly low with chronic coarse interstitial opacities again noted throughout both lungs. No definite acute airspace opacity is identified allowing for portable AP technique today compared to two view upright imaging yesterday. No sizable pleural effusion or pneumothorax is identified. No acute osseous abnormality is seen. IMPRESSION: Chronic interstitial lung disease without evidence of acute abnormality. Electronically Signed   By: Logan Bores M.D.   On: 08/30/2018 12:24        Scheduled Meds:  atorvastatin  10 mg Oral QHS   budesonide (PULMICORT) nebulizer solution  0.25 mg Nebulization BID   Chlorhexidine Gluconate Cloth  6 each Topical Daily   enoxaparin (LOVENOX) injection  40 mg Subcutaneous Q24H   escitalopram  10 mg Oral Daily   folic acid  1 mg Oral Daily   guaiFENesin-dextromethorphan  10 mL Oral Q8H   ipratropium-albuterol  3 mL Nebulization TID   methylPREDNISolone (SOLU-MEDROL) injection  60 mg Intravenous Q12H   Continuous Infusions:  sodium chloride 5 mL/hr at 08/31/18 0603   azithromycin 500 mg (08/31/18 1712)     LOS: 0 days    Time spent: 30 minutes    [place] Karl Darleen Crocker, DO [facility] Pager [phone]  If 7PM-7AM, please contact night-coverage [email or web address] Password [email or web address] 09/01/2018, 7:15 AM

## 2018-09-01 NOTE — Progress Notes (Signed)
Subjective: He feels better.  He is less short of breath than yesterday.  He did become hypoxic when he got up to go to the bathroom.  He became very short of breath and more hypoxic.  He had been able to go down to 4 L nasal cannula but is now back up on 7 with oxygen saturation in the 90s.  He is tachycardic at about 120.  Objective: Vital signs in last 24 hours: Temp:  [97.7 F (36.5 C)-98.8 F (37.1 C)] 97.7 F (36.5 C) (05/09 0717) Pulse Rate:  [79-105] 89 (05/09 0717) Resp:  [15-31] 25 (05/09 0717) BP: (95-135)/(53-78) 122/69 (05/09 0600) SpO2:  [79 %-100 %] 97 % (05/09 0717) Weight:  [68 kg] 68 kg (05/09 0411) Weight change: -1.854 kg Last BM Date: 08/30/18  Intake/Output from previous day: 05/08 0701 - 05/09 0700 In: 284.8 [P.O.:240; I.V.:44.8] Out: 1600 [Urine:1600]  PHYSICAL EXAM General appearance: alert, cooperative and mild distress Resp: rales Diffusely bilaterally Cardio: His heart is regular with tachycardia as previously noted GI: soft, non-tender; bowel sounds normal; no masses,  no organomegaly Extremities: extremities normal, atraumatic, no cyanosis or edema  Lab Results:  Results for orders placed or performed during the hospital encounter of 08/30/18 (from the past 48 hour(s))  SARS Coronavirus 2 (CEPHEID- Performed in [place] hospital lab), Hosp Order     Status: None   Collection Time: 08/30/18 11:36 AM  Result Value Ref Range   SARS Coronavirus 2 NEGATIVE NEGATIVE    Comment: (NOTE) If result is NEGATIVE SARS-CoV-2 target nucleic acids are NOT DETECTED. The SARS-CoV-2 RNA is generally detectable in upper and lower  respiratory specimens during the acute phase of infection. The lowest  concentration of SARS-CoV-2 viral copies this assay can detect is 250  copies / mL. A negative result does not preclude SARS-CoV-2 infection  and should not be used as the sole basis for treatment or other  patient management decisions.  A negative result may  occur with  improper specimen collection / handling, submission of specimen other  than nasopharyngeal swab, presence of viral mutation(s) within the  areas targeted by this assay, and inadequate number of viral copies  (<250 copies / mL). A negative result must be combined with clinical  observations, patient history, and epidemiological information. If result is POSITIVE SARS-CoV-2 target nucleic acids are DETECTED. The SARS-CoV-2 RNA is generally detectable in upper and lower  respiratory specimens dur ing the acute phase of infection.  Positive  results are indicative of active infection with SARS-CoV-2.  Clinical  correlation with patient history and other diagnostic information is  necessary to determine patient infection status.  Positive results do  not rule out bacterial infection or co-infection with other viruses. If result is PRESUMPTIVE POSTIVE SARS-CoV-2 nucleic acids MAY BE PRESENT.   A presumptive positive result was obtained on the submitted specimen  and confirmed on repeat testing.  While 2019 novel coronavirus  (SARS-CoV-2) nucleic acids may be present in the submitted sample  additional confirmatory testing may be necessary for epidemiological  and / or clinical management purposes  to differentiate between  SARS-CoV-2 and other Sarbecovirus currently known to infect humans.  If clinically indicated additional testing with an alternate test  methodology [phone]) is advised. The SARS-CoV-2 RNA is generally  detectable in upper and lower respiratory sp ecimens during the acute  phase of infection. The expected result is Negative. Fact Sheet for Patients:  StrictlyIdeas.no Fact Sheet for Healthcare Providers: BankingDealers.co.za This test is  not yet approved or cleared by the [place] and has been authorized for detection and/or diagnosis of SARS-CoV-2 by FDA under an Emergency Use Authorization (EUA).  This  EUA will remain in effect (meaning this test can be used) for the duration of the COVID-19 declaration under Section 564(b)(1) of the Act, 21 U.S.C. section 360bbb-3(b)(1), unless the authorization is terminated or revoked sooner. Performed at [facility], [address] [place], [place] [address]   Comprehensive metabolic panel     Status: Abnormal   Collection Time: 08/30/18 12:32 PM  Result Value Ref Range   Sodium 139 135 - 145 mmol/L   Potassium 3.8 3.5 - 5.1 mmol/L   Chloride 100 98 - 111 mmol/L   CO2 25 22 - 32 mmol/L   Glucose, Bld 92 70 - 99 mg/dL   BUN 13 8 - 23 mg/dL   Creatinine, Ser 0.92 0.61 - 1.24 mg/dL   Calcium 8.8 (L) 8.9 - 10.3 mg/dL   Total Protein 6.9 6.5 - 8.1 g/dL   Albumin 3.2 (L) 3.5 - 5.0 g/dL   AST 17 15 - 41 U/L   ALT 23 0 - 44 U/L   Alkaline Phosphatase 48 38 - 126 U/L   Total Bilirubin 0.7 0.3 - 1.2 mg/dL   GFR calc non Af Amer >60 >60 mL/min   GFR calc Af Amer >60 >60 mL/min   Anion gap 14 5 - 15    Comment: Performed at [facility], [address], [place], [place] [address]  Brain natriuretic peptide     Status: None   Collection Time: 08/30/18 12:32 PM  Result Value Ref Range   B Natriuretic Peptide 76.0 0.0 - 100.0 pg/mL    Comment: Performed at [facility], [address], [place], [place] [address]  CBC with Differential     Status: Abnormal   Collection Time: 08/30/18 12:32 PM  Result Value Ref Range   WBC 10.0 4.0 - 10.5 K/uL   RBC 3.66 (L) 4.22 - 5.81 MIL/uL   Hemoglobin 10.4 (L) 13.0 - 17.0 g/dL   HCT 32.6 (L) 39.0 - 52.0 %   MCV 89.1 80.0 - 100.0 fL   MCH 28.4 26.0 - 34.0 pg   MCHC 31.9 30.0 - 36.0 g/dL   RDW 18.1 (H) 11.5 - 15.5 %   Platelets 202 150 - 400 K/uL   nRBC 0.0 0.0 - 0.2 %   Neutrophils Relative % 87 %   Neutro Abs 8.7 (H) 1.7 - 7.7 K/uL   Lymphocytes Relative 5 %   Lymphs Abs 0.5 (L) 0.7 - 4.0 K/uL   Monocytes Relative 3 %   Monocytes Absolute 0.3 0.1 - 1.0 K/uL   Eosinophils Relative 4 %   Eosinophils  Absolute 0.4 0.0 - 0.5 K/uL   Basophils Relative 0 %   Basophils Absolute 0.0 0.0 - 0.1 K/uL   Immature Granulocytes 1 %   Abs Immature Granulocytes 0.05 0.00 - 0.07 K/uL    Comment: Performed at [facility], [address] [place], [place] [address]  Troponin I - Once     Status: Abnormal   Collection Time: 08/30/18 12:32 PM  Result Value Ref Range   Troponin I 0.05 (HH) <0.03 ng/mL    Comment: CRITICAL RESULT CALLED TO, READ BACK BY AND VERIFIED WITH: STIENHISER,C AT 1351 ON 5.7.20 BY ISLEY,B Performed at [facility], [address] [place], [place] [address]   Blood gas, venous     Status: Abnormal  Collection Time: 08/30/18 12:32 PM  Result Value Ref Range   FIO2 44.00    pH, Ven 7.421 7.250 - 7.430   pCO2, Ven 48.7 44.0 - 60.0 mmHg   pO2, Ven <31.0 (LL) 32.0 - 45.0 mmHg    Comment: CRITICAL RESULT CALLED TO, READ BACK BY AND VERIFIED WITH: STINHISER,C AT 1250 ON 5.7.20 BY ISLEY,B    Bicarbonate 26.4 20.0 - 28.0 mmol/L   Acid-Base Excess 6.7 (H) 0.0 - 2.0 mmol/L   O2 Saturation 13.4 %   Patient temperature 37.2     Comment: Performed at [facility], [address], [place], [place] [address]  Blood culture (routine x 2)     Status: None (Preliminary result)   Collection Time: 08/30/18 12:32 PM  Result Value Ref Range   Specimen Description LEFT ANTECUBITAL    Special Requests      BOTTLES DRAWN AEROBIC AND ANAEROBIC Blood Culture adequate volume   Culture      NO GROWTH 2 DAYS Performed at [facility], [address] [place], [place] [address]    Report Status PENDING   Blood culture (routine x 2)     Status: None (Preliminary result)   Collection Time: 08/30/18 12:32 PM  Result Value Ref Range   Specimen Description BLOOD RIGHT FOREARM    Special Requests      BOTTLES DRAWN AEROBIC AND ANAEROBIC Blood Culture adequate volume   Culture      NO GROWTH 2 DAYS Performed at [facility], [address] [place], [place] [address]    Report Status PENDING   Blood  gas, arterial     Status: Abnormal   Collection Time: 08/30/18  1:40 PM  Result Value Ref Range   FIO2 32.00    pH, Arterial 7.468 (H) 7.350 - 7.450   pCO2 arterial 33.6 32.0 - 48.0 mmHg   pO2, Arterial 66.9 (L) 83.0 - 108.0 mmHg   Bicarbonate 25.2 20.0 - 28.0 mmol/L   Acid-Base Excess 0.7 0.0 - 2.0 mmol/L   O2 Saturation 92.8 %   Patient temperature 36.6    Allens test (pass/fail) PASS PASS    Comment: Performed at [facility], [address], [place], [place] [address]  MRSA PCR Screening     Status: None   Collection Time: 08/30/18  5:33 PM  Result Value Ref Range   MRSA by PCR NEGATIVE NEGATIVE    Comment:        The GeneXpert MRSA Assay (FDA approved for NASAL specimens only), is one component of a comprehensive MRSA colonization surveillance program. It is not intended to diagnose MRSA infection nor to guide or monitor treatment for MRSA infections. Performed at [facility], [address] [place], [place] [address]   Troponin I - Now Then Q6H     Status: Abnormal   Collection Time: 08/30/18  6:18 PM  Result Value Ref Range   Troponin I 0.05 (HH) <0.03 ng/mL    Comment: CRITICAL VALUE NOTED.  VALUE IS CONSISTENT WITH PREVIOUSLY REPORTED AND CALLED VALUE. Performed at [facility], [address], [place], [place] [address]   Troponin I - Now Then Q6H     Status: Abnormal   Collection Time: 08/31/18 12:19 AM  Result Value Ref Range   Troponin I 0.04 (HH) <0.03 ng/mL    Comment: CRITICAL VALUE NOTED.  VALUE IS CONSISTENT WITH PREVIOUSLY REPORTED AND CALLED VALUE. Performed at [facility], [address], [place], [place] [address]   Procalcitonin - Baseline  Status: None   Collection Time: 08/31/18 12:19 AM  Result Value Ref Range   Procalcitonin 0.15 ng/mL    Comment:        Interpretation: PCT (Procalcitonin) <= 0.5 ng/mL: Systemic infection (sepsis) is not likely. Local bacterial infection is possible. (NOTE)       Sepsis PCT Algorithm           Lower  Respiratory Tract                                      Infection PCT Algorithm    ----------------------------     ----------------------------         PCT < 0.25 ng/mL                PCT < 0.10 ng/mL         Strongly encourage             Strongly discourage   discontinuation of antibiotics    initiation of antibiotics    ----------------------------     -----------------------------       PCT 0.25 - 0.50 ng/mL            PCT 0.10 - 0.25 ng/mL               OR       >80% decrease in PCT            Discourage initiation of                                            antibiotics      Encourage discontinuation           of antibiotics    ----------------------------     -----------------------------         PCT >= 0.50 ng/mL              PCT 0.26 - 0.50 ng/mL               AND        <80% decrease in PCT             Encourage initiation of                                             antibiotics       Encourage continuation           of antibiotics    ----------------------------     -----------------------------        PCT >= 0.50 ng/mL                  PCT > 0.50 ng/mL               AND         increase in PCT                  Strongly encourage                                      initiation of antibiotics    Strongly encourage  escalation           of antibiotics                                     -----------------------------                                           PCT <= 0.25 ng/mL                                                 OR                                        > 80% decrease in PCT                                     Discontinue / Do not initiate                                             antibiotics Performed at [facility], [address] [place], [place] [address]   Respiratory Panel by PCR     Status: None   Collection Time: 08/31/18  7:32 AM  Result Value Ref Range   Adenovirus NOT DETECTED NOT DETECTED   Coronavirus 229E NOT DETECTED NOT DETECTED     Comment: (NOTE) The Coronavirus on the Respiratory Panel, DOES NOT test for the novel  Coronavirus (2019 nCoV)    Coronavirus HKU1 NOT DETECTED NOT DETECTED   Coronavirus NL63 NOT DETECTED NOT DETECTED   Coronavirus OC43 NOT DETECTED NOT DETECTED   Metapneumovirus NOT DETECTED NOT DETECTED   Rhinovirus / Enterovirus NOT DETECTED NOT DETECTED   Influenza A NOT DETECTED NOT DETECTED   Influenza B NOT DETECTED NOT DETECTED   Parainfluenza Virus 1 NOT DETECTED NOT DETECTED   Parainfluenza Virus 2 NOT DETECTED NOT DETECTED   Parainfluenza Virus 3 NOT DETECTED NOT DETECTED   Parainfluenza Virus 4 NOT DETECTED NOT DETECTED   Respiratory Syncytial Virus NOT DETECTED NOT DETECTED   Bordetella pertussis NOT DETECTED NOT DETECTED   Chlamydophila pneumoniae NOT DETECTED NOT DETECTED   Mycoplasma pneumoniae NOT DETECTED NOT DETECTED    Comment: Performed at [facility] [address], [place], [place] [address]  CBC     Status: Abnormal   Collection Time: 09/01/18  4:39 AM  Result Value Ref Range   WBC 13.2 (H) 4.0 - 10.5 K/uL   RBC 3.17 (L) 4.22 - 5.81 MIL/uL   Hemoglobin 9.1 (L) 13.0 - 17.0 g/dL   HCT 28.1 (L) 39.0 - 52.0 %   MCV 88.6 80.0 - 100.0 fL   MCH 28.7 26.0 - 34.0 pg   MCHC 32.4 30.0 - 36.0 g/dL   RDW 18.1 (H) 11.5 - 15.5 %   Platelets 230 150 - 400 K/uL   nRBC 0.0 0.0 - 0.2 %  Comment: Performed at [facility], [address] [place], [place] [address]  Basic metabolic panel     Status: Abnormal   Collection Time: 09/01/18  4:39 AM  Result Value Ref Range   Sodium 140 135 - 145 mmol/L   Potassium 2.9 (L) 3.5 - 5.1 mmol/L    Comment: DELTA CHECK NOTED   Chloride 102 98 - 111 mmol/L   CO2 27 22 - 32 mmol/L   Glucose, Bld 158 (H) 70 - 99 mg/dL   BUN 19 8 - 23 mg/dL   Creatinine, Ser 0.95 0.61 - 1.24 mg/dL   Calcium 8.3 (L) 8.9 - 10.3 mg/dL   GFR calc non Af Amer >60 >60 mL/min   GFR calc Af Amer >60 >60 mL/min   Anion gap 11 5 - 15    Comment: Performed at  [facility], [address], [place], [place] [address]  Magnesium     Status: None   Collection Time: 09/01/18  4:39 AM  Result Value Ref Range   Magnesium 2.0 1.7 - 2.4 mg/dL    Comment: Performed at [facility], [address], [place], [place] [address]    ABGS Recent Labs    08/30/18 1340  PHART 7.468*  PO2ART 66.9*  HCO3 25.2   CULTURES Recent Results (from the past 240 hour(s))  Blood Culture (routine x 2)     Status: None   Collection Time: 08/22/18  5:30 PM  Result Value Ref Range Status   Specimen Description   Final    BLOOD RIGHT ANTECUBITAL Performed at [facility], [place] [place], [place] [address]    Special Requests   Final    BOTTLES DRAWN AEROBIC AND ANAEROBIC Blood Culture adequate volume Performed at [facility], [address] [place], [place] [address]    Culture   Final    NO GROWTH 5 DAYS Performed at [facility], [place] [address] [place], [place] [address]    Report Status 08/27/2018 FINAL  Final  Blood Culture (routine x 2)     Status: None   Collection Time: 08/22/18  6:00 PM  Result Value Ref Range Status   Specimen Description   Final    BLOOD BLOOD RIGHT FOREARM Performed at [facility], [address] [place], [place] [address]    Special Requests   Final    BOTTLES DRAWN AEROBIC AND ANAEROBIC Blood Culture adequate volume Performed at [facility], [address] [place], [place] [address]    Culture   Final    NO GROWTH 5 DAYS Performed at [facility], [place] [address], [place], [place] [address]    Report Status 08/27/2018 FINAL  Final  SARS Coronavirus 2 (CEPHEID- Performed in [place] hospital lab), Hosp Order     Status: None   Collection Time: 08/30/18 11:36 AM  Result Value Ref Range Status   SARS Coronavirus 2 NEGATIVE NEGATIVE Final    Comment: (NOTE) If result is NEGATIVE SARS-CoV-2 target nucleic acids are NOT DETECTED. The SARS-CoV-2 RNA is  generally detectable in upper and lower  respiratory specimens during the acute phase of infection. The lowest  concentration of SARS-CoV-2 viral copies this assay can detect is 250  copies / mL. A negative result does not preclude SARS-CoV-2 infection  and should not be used as the sole basis for treatment or other  patient management decisions.  A negative result may  occur with  improper specimen collection / handling, submission of specimen other  than nasopharyngeal swab, presence of viral mutation(s) within the  areas targeted by this assay, and inadequate number of viral copies  (<250 copies / mL). A negative result must be combined with clinical  observations, patient history, and epidemiological information. If result is POSITIVE SARS-CoV-2 target nucleic acids are DETECTED. The SARS-CoV-2 RNA is generally detectable in upper and lower  respiratory specimens dur ing the acute phase of infection.  Positive  results are indicative of active infection with SARS-CoV-2.  Clinical  correlation with patient history and other diagnostic information is  necessary to determine patient infection status.  Positive results do  not rule out bacterial infection or co-infection with other viruses. If result is PRESUMPTIVE POSTIVE SARS-CoV-2 nucleic acids MAY BE PRESENT.   A presumptive positive result was obtained on the submitted specimen  and confirmed on repeat testing.  While 2019 novel coronavirus  (SARS-CoV-2) nucleic acids may be present in the submitted sample  additional confirmatory testing may be necessary for epidemiological  and / or clinical management purposes  to differentiate between  SARS-CoV-2 and other Sarbecovirus currently known to infect humans.  If clinically indicated additional testing with an alternate test  methodology [phone]) is advised. The SARS-CoV-2 RNA is generally  detectable in upper and lower respiratory sp ecimens during the acute  phase of  infection. The expected result is Negative. Fact Sheet for Patients:  StrictlyIdeas.no Fact Sheet for Healthcare Providers: BankingDealers.co.za This test is not yet approved or cleared by the [place] FDA and has been authorized for detection and/or diagnosis of SARS-CoV-2 by FDA under an Emergency Use Authorization (EUA).  This EUA will remain in effect (meaning this test can be used) for the duration of the COVID-19 declaration under Section 564(b)(1) of the Act, 21 U.S.C. section 360bbb-3(b)(1), unless the authorization is terminated or revoked sooner. Performed at [facility], [address], [place], [place] [address]   Blood culture (routine x 2)     Status: None (Preliminary result)   Collection Time: 08/30/18 12:32 PM  Result Value Ref Range Status   Specimen Description LEFT ANTECUBITAL  Final   Special Requests   Final    BOTTLES DRAWN AEROBIC AND ANAEROBIC Blood Culture adequate volume   Culture   Final    NO GROWTH 2 DAYS Performed at [facility], [address], [place], [place] [address]    Report Status PENDING  Incomplete  Blood culture (routine x 2)     Status: None (Preliminary result)   Collection Time: 08/30/18 12:32 PM  Result Value Ref Range Status   Specimen Description BLOOD RIGHT FOREARM  Final   Special Requests   Final    BOTTLES DRAWN AEROBIC AND ANAEROBIC Blood Culture adequate volume   Culture   Final    NO GROWTH 2 DAYS Performed at [facility], [address] [place], [place] [address]    Report Status PENDING  Incomplete  MRSA PCR Screening     Status: None   Collection Time: 08/30/18  5:33 PM  Result Value Ref Range Status   MRSA by PCR NEGATIVE NEGATIVE Final    Comment:        The GeneXpert MRSA Assay (FDA approved for NASAL specimens only), is one component of a comprehensive MRSA colonization surveillance program. It is not intended to diagnose MRSA infection nor to guide  or monitor treatment for MRSA infections. Performed at [facility], [place]  [address], [place], [place] [address]   Respiratory Panel by PCR     Status: None   Collection Time: 08/31/18  7:32 AM  Result Value Ref Range Status   Adenovirus NOT DETECTED NOT DETECTED Final   Coronavirus 229E NOT DETECTED NOT DETECTED Final    Comment: (NOTE) The Coronavirus on the Respiratory Panel, DOES NOT test for the novel  Coronavirus (2019 nCoV)    Coronavirus HKU1 NOT DETECTED NOT DETECTED Final   Coronavirus NL63 NOT DETECTED NOT DETECTED Final   Coronavirus OC43 NOT DETECTED NOT DETECTED Final   Metapneumovirus NOT DETECTED NOT DETECTED Final   Rhinovirus / Enterovirus NOT DETECTED NOT DETECTED Final   Influenza A NOT DETECTED NOT DETECTED Final   Influenza B NOT DETECTED NOT DETECTED Final   Parainfluenza Virus 1 NOT DETECTED NOT DETECTED Final   Parainfluenza Virus 2 NOT DETECTED NOT DETECTED Final   Parainfluenza Virus 3 NOT DETECTED NOT DETECTED Final   Parainfluenza Virus 4 NOT DETECTED NOT DETECTED Final   Respiratory Syncytial Virus NOT DETECTED NOT DETECTED Final   Bordetella pertussis NOT DETECTED NOT DETECTED Final   Chlamydophila pneumoniae NOT DETECTED NOT DETECTED Final   Mycoplasma pneumoniae NOT DETECTED NOT DETECTED Final    Comment: Performed at [facility], [address], [place], [place] [address]   Studies/Results: Ct Angio Chest Pe W And/or Wo Contrast  Result Date: 08/30/2018 CLINICAL DATA:  73 year old male with shortness of breath EXAM: CT ANGIOGRAPHY CHEST WITH CONTRAST TECHNIQUE: Multidetector CT imaging of the chest was performed using the standard protocol during bolus administration of intravenous contrast. Multiplanar CT image reconstructions and MIPs were obtained to evaluate the vascular anatomy. CONTRAST:  156mL OMNIPAQUE IOHEXOL 350 MG/ML SOLN COMPARISON:  CT 08/22/2018, 08/09/2018 FINDINGS: Cardiovascular: Heart: Similar appearance of the heart size with  cardiomegaly. No pericardial fluid/thickening . Calcifications of left main, left anterior descending, right coronary arteries. Aorta: Unremarkable course, caliber, contour of the thoracic aorta. No aneurysm or dissection flap. No periaortic fluid. Calcifications of the aortic arch. Pulmonary arteries: No central, lobar, segmental, or proximal subsegmental filling defects. Main pulmonary diameter measures 3.0 cm Mediastinum/Nodes: No mediastinal adenopathy. Unremarkable appearance of the thoracic esophagus. Unremarkable thoracic inlet Lungs/Pleura: Redemonstration of background of centrilobular and paraseptal emphysema with architectural distortion and a pattern of subpleural reticulation/honeycomb pattern of the bilateral lungs, predominance of the lower lobes. Interlobular septal thickening. New trace left pleural fluid compared to the prior CT. Slight increased right pleural fluid at the fissure compared to the prior CT. No endotracheal or endobronchial debris. Bronchial wall thickening is similar to prior. Overall, there is slight increased interstitial opacities. Upper Abdomen: No acute. Musculoskeletal: No acute displaced fracture. Degenerative changes of the spine. Review of the MIP images confirms the above findings. IMPRESSION: Redemonstration of advanced interstitial fibrosis, with likely superimposed acute bronchitis/atypical infection given that there is slight increased prominence of the interstitial opacities, enlarging right pleural effusion, and new left pleural effusion. No evidence of overt edema. Aortic atherosclerosis and coronary artery disease. Aortic Atherosclerosis (ICD10-I70.0). Electronically Signed   By: Corrie Mckusick D.O.   On: 08/30/2018 15:31   US Venous Img Lower Unilateral Right  Result Date: 08/31/2018 CLINICAL DATA:  Right lower extremity edema. History of prostate cancer. Evaluate for DVT. EXAM: RIGHT LOWER EXTREMITY VENOUS DOPPLER ULTRASOUND TECHNIQUE: Gray-scale sonography  with graded compression, as well as color Doppler and duplex ultrasound were performed to evaluate the lower extremity deep venous systems from the level of the common femoral  vein and including the common femoral, femoral, profunda femoral, popliteal and calf veins including the posterior tibial, peroneal and gastrocnemius veins when visible. The superficial great saphenous vein was also interrogated. Spectral Doppler was utilized to evaluate flow at rest and with distal augmentation maneuvers in the common femoral, femoral and popliteal veins. COMPARISON:  None. FINDINGS: Contralateral Common Femoral Vein: Respiratory phasicity is normal and symmetric with the symptomatic side. No evidence of thrombus. Normal compressibility. Common Femoral Vein: No evidence of thrombus. Normal compressibility, respiratory phasicity and response to augmentation. Saphenofemoral Junction: No evidence of thrombus. Normal compressibility and flow on color Doppler imaging. Profunda Femoral Vein: No evidence of thrombus. Normal compressibility and flow on color Doppler imaging. Femoral Vein: No evidence of thrombus. Normal compressibility, respiratory phasicity and response to augmentation. Popliteal Vein: No evidence of thrombus. Normal compressibility, respiratory phasicity and response to augmentation. Calf Veins: No evidence of thrombus. Normal compressibility and flow on color Doppler imaging. Superficial Great Saphenous Vein: No evidence of thrombus. Normal compressibility. Venous Reflux:  None. Other Findings: A minimal subcutaneous edema is noted at the level right lower leg and calf. IMPRESSION: No evidence DVT within the right lower extremity. Electronically Signed   By: Sandi Mariscal M.D.   On: 08/31/2018 09:19   Dg Chest Portable 1 View  Result Date: 08/30/2018 CLINICAL DATA:  Shortness of breath. History of smoking and pulmonary fibrosis. EXAM: PORTABLE CHEST 1 VIEW COMPARISON:  08/29/2018 FINDINGS: The cardiomediastinal  silhouette is unchanged. Aortic atherosclerosis is noted. Lung volumes remain mildly low with chronic coarse interstitial opacities again noted throughout both lungs. No definite acute airspace opacity is identified allowing for portable AP technique today compared to two view upright imaging yesterday. No sizable pleural effusion or pneumothorax is identified. No acute osseous abnormality is seen. IMPRESSION: Chronic interstitial lung disease without evidence of acute abnormality. Electronically Signed   By: Logan Bores M.D.   On: 08/30/2018 12:24    Medications:  Prior to Admission:  Medications Prior to Admission  Medication Sig Dispense Refill Last Dose  . albuterol (VENTOLIN HFA) 108 (90 Base) MCG/ACT inhaler Inhale 2 puffs into the lungs every 6 (six) hours as needed for wheezing or shortness of breath. 1 Inhaler 0 Taking  . atorvastatin (LIPITOR) 10 MG tablet Take 1 tablet (10 mg total) by mouth at bedtime. 90 tablet 1 Past Week at Unknown time  . B-D TB SYRINGE 1CC/26GX3/8" 26G X 3/8" 1 ML MISC USE TO INJECT methotrexate ONCE every WEEK AS DIRECTED   08/29/2018 at Unknown time  . escitalopram (LEXAPRO) 20 MG tablet Take 0.5 tablets (10 mg total) by mouth daily. 30 tablet 5 08/29/2018 at Unknown time  . folic acid (FOLVITE) 1 MG tablet Take 1 tablet by mouth daily.   08/29/2018 at Unknown time  . leuprolide (LUPRON) 30 MG injection Inject 30 mg into the muscle every 3 (three) months.   Taking  . Multiple Vitamin (MULTIVITAMIN WITH MINERALS) TABS tablet Take 1 tablet by mouth daily. 30 tablet 0 08/29/2018 at Unknown time  . OXYGEN 4 lpm 24/7  unsure of DME   08/30/2018 at Unknown time  . predniSONE (DELTASONE) 1 MG tablet Take 5 tablets (5 mg total) by mouth daily with breakfast. 150 tablet 1 Past Week at Unknown time  . predniSONE (DELTASONE) 10 MG tablet Take  4 each am x 2 days,   2 each am x 2 days,  1 each am x 2 days and stop 14 tablet 0 08/29/2018 at Unknown  time  . amLODipine (NORVASC) 5 MG  tablet Take 5 mg by mouth daily.   Not Taking at Unknown time  . B Complex Vitamins (VITAMIN B-COMPLEX PO) Take 1 tablet by mouth daily.    Not Taking at Unknown time  . Cholecalciferol (VITAMIN D3) 5000 UNITS TABS Take 1-2 tablets by mouth daily.    Not Taking at Unknown time  . furosemide (LASIX) 20 MG tablet Take 20 mg by mouth daily.   Not Taking at Unknown time  . guaiFENesin (MUCINEX) 600 MG 12 hr tablet Take 2 tablets (1,200 mg total) by mouth 2 (two) times daily as needed. (Patient not taking: Reported on 08/30/2018) 14 tablet 0 Not Taking at Unknown time  . lisinopril (ZESTRIL) 10 MG tablet Take 10 mg by mouth daily.   Not Taking at Unknown time  . OLIVE LEAF PO Take 1 tablet by mouth daily.    Not Taking at Unknown time   Scheduled: . atorvastatin  10 mg Oral QHS  . budesonide (PULMICORT) nebulizer solution  0.25 mg Nebulization BID  . Chlorhexidine Gluconate Cloth  6 each Topical Daily  . enoxaparin (LOVENOX) injection  40 mg Subcutaneous Q24H  . escitalopram  10 mg Oral Daily  . folic acid  1 mg Oral Daily  . guaiFENesin-dextromethorphan  10 mL Oral Q8H  . ipratropium-albuterol  3 mL Nebulization TID  . lisinopril  10 mg Oral Daily  . methylPREDNISolone (SOLU-MEDROL) injection  60 mg Intravenous Q12H  . potassium chloride  40 mEq Oral TID   Continuous: . sodium chloride 5 mL/hr at 08/31/18 0603  . azithromycin 500 mg (08/31/18 1712)   YQI:HKVQQV chloride, acetaminophen **OR** acetaminophen, albuterol, ondansetron **OR** ondansetron (ZOFRAN) IV, polyethylene glycol  Assesment: He was admitted with acute on chronic hypoxic respiratory failure.  He required high flow nasal cannula up to 15 L.  He has been able to be weaned some with treatment and was down to 4 L earlier this morning but is back up to 7 now I think because he became more dyspneic with exertion.  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  He probably has some element of COPD from smoking in the past.  He is deconditioned and consideration is being made for rehab placement  He probably had some element of volume overload and diuresed with Lasix and seems to be euvolemic now  He is hypokalemic which is being replaced  He had swelling of his leg but no evidence of DVT  He has anemia and his hemoglobin level has dropped some Active Problems:   Acute on chronic respiratory failure ([place])    Plan: Continue current treatments.  He does seem to be improving    LOS: 0 days   Alonza Bogus 09/01/2018, 8:43 AM

## 2018-09-01 NOTE — Progress Notes (Signed)
Healing areas on buttocks and sacral area are covered in thin layer skin protectant and/or foam dressing. Patient has expressed pain relief from this application process/combination.

## 2018-09-01 NOTE — Progress Notes (Signed)
Patient has large area [place] with no deep area. Hurts wen cleaning and mostly on left complete buttocks.

## 2018-09-02 DIAGNOSIS — J441 Chronic obstructive pulmonary disease with (acute) exacerbation: Secondary | ICD-10-CM

## 2018-09-02 LAB — BASIC METABOLIC PANEL
Anion gap: 8 (ref 5–15)
BUN: 21 mg/dL (ref 8–23)
CO2: 29 mmol/L (ref 22–32)
Calcium: 8.3 mg/dL — ABNORMAL LOW (ref 8.9–10.3)
Chloride: 103 mmol/L (ref 98–111)
Creatinine, Ser: 0.85 mg/dL (ref 0.61–1.24)
GFR calc Af Amer: >60 mL/min (ref >60)
GFR calc non Af Amer: >60 mL/min (ref >60)
Glucose, Bld: 156 mg/dL — ABNORMAL HIGH (ref 70–99)
Potassium: 4.5 mmol/L (ref 3.5–5.1)
Sodium: 140 mmol/L (ref 135–145)

## 2018-09-02 LAB — MAGNESIUM: Magnesium: 2 mg/dL (ref 1.7–2.4)

## 2018-09-02 MED ORDER — POTASSIUM CHLORIDE CRYS ER 20 MEQ PO TBCR
40.0000 meq | EXTENDED_RELEASE_TABLET | Freq: Every day | ORAL | Status: DC
  Administered 2018-09-02: 08:00:00 40 meq via ORAL
  Filled 2018-09-02 (×2): qty 2

## 2018-09-02 MED ORDER — FUROSEMIDE 10 MG/ML IJ SOLN
40.0000 mg | Freq: Once | INTRAMUSCULAR | Status: AC
  Administered 2018-09-02: 08:00:00 40 mg via INTRAVENOUS
  Filled 2018-09-02: qty 4

## 2018-09-02 NOTE — Progress Notes (Signed)
Subjective: He says he feels a little bit better but he is still having episodes of hypoxia with exertion.  He is still requiring high flow nasal cannula.  He is coughing a little bit.  He is not having any other new complaints.  No fever chills no chest pain  Objective: Vital signs in last 24 hours: Temp:  [97.9 F (36.6 C)-98.6 F (37 C)] 98.4 F (36.9 C) (05/10 0713) Pulse Rate:  [72-101] 77 (05/10 0713) Resp:  [15-31] 15 (05/10 0713) BP: (104-138)/(52-87) 134/81 (05/10 0600) SpO2:  [95 %-100 %] 100 % (05/10 0713) Weight:  [68.9 kg] 68.9 kg (05/10 0500) Weight change: 0.9 kg Last BM Date: 09/01/18  Intake/Output from previous day: 05/09 0701 - 05/10 0700 In: 1604.3 [P.O.:1320; I.V.:34.3; IV Piggyback:250] Out: 1325 [Urine:1325]  PHYSICAL EXAM General appearance: alert, cooperative and mild distress Resp: rales Diffusely bilaterally Cardio: regular rate and rhythm, S1, S2 normal, no murmur, click, rub or gallop GI: soft, non-tender; bowel sounds normal; no masses,  no organomegaly Extremities: extremities normal, atraumatic, no cyanosis or edema  Lab Results:  Results for orders placed or performed during the hospital encounter of 08/30/18 (from the past 48 hour(s))  CBC     Status: Abnormal   Collection Time: 09/01/18  4:39 AM  Result Value Ref Range   WBC 13.2 (H) 4.0 - 10.5 K/uL   RBC 3.17 (L) 4.22 - 5.81 MIL/uL   Hemoglobin 9.1 (L) 13.0 - 17.0 g/dL   HCT 28.1 (L) 39.0 - 52.0 %   MCV 88.6 80.0 - 100.0 fL   MCH 28.7 26.0 - 34.0 pg   MCHC 32.4 30.0 - 36.0 g/dL   RDW 18.1 (H) 11.5 - 15.5 %   Platelets 230 150 - 400 K/uL   nRBC 0.0 0.0 - 0.2 %    Comment: Performed at [facility], [address], [place], [place] [address]  Basic metabolic panel     Status: Abnormal   Collection Time: 09/01/18  4:39 AM  Result Value Ref Range   Sodium 140 135 - 145 mmol/L   Potassium 2.9 (L) 3.5 - 5.1 mmol/L    Comment: DELTA CHECK NOTED   Chloride 102 98 - 111 mmol/L   CO2 27 22  - 32 mmol/L   Glucose, Bld 158 (H) 70 - 99 mg/dL   BUN 19 8 - 23 mg/dL   Creatinine, Ser 0.95 0.61 - 1.24 mg/dL   Calcium 8.3 (L) 8.9 - 10.3 mg/dL   GFR calc non Af Amer >60 >60 mL/min   GFR calc Af Amer >60 >60 mL/min   Anion gap 11 5 - 15    Comment: Performed at [facility], [address], [place], [place] [address]  Magnesium     Status: None   Collection Time: 09/01/18  4:39 AM  Result Value Ref Range   Magnesium 2.0 1.7 - 2.4 mg/dL    Comment: Performed at [facility], [address], [place],  [address]  Basic metabolic panel     Status: Abnormal   Collection Time: 09/02/18  4:33 AM  Result Value Ref Range   Sodium 140 135 - 145 mmol/L   Potassium 4.5 3.5 - 5.1 mmol/L    Comment: DELTA CHECK NOTED   Chloride 103 98 - 111 mmol/L   CO2 29 22 - 32 mmol/L   Glucose, Bld 156 (H) 70 - 99 mg/dL   BUN 21 8 - 23 mg/dL   Creatinine, Ser 0.85 0.61 - 1.24 mg/dL  Calcium 8.3 (L) 8.9 - 10.3 mg/dL   GFR calc non Af Amer >60 >60 mL/min   GFR calc Af Amer >60 >60 mL/min   Anion gap 8 5 - 15    Comment: Performed at [facility], [address], [place], [place] [address]  Magnesium     Status: None   Collection Time: 09/02/18  4:33 AM  Result Value Ref Range   Magnesium 2.0 1.7 - 2.4 mg/dL    Comment: Performed at [facility], [address] [place], [place] [address]    ABGS Recent Labs    08/30/18 1340  PHART 7.468*  PO2ART 66.9*  HCO3 25.2   CULTURES Recent Results (from the past 240 hour(s))  SARS Coronavirus 2 (CEPHEID- Performed in [place] hospital lab), Hosp Order     Status: None   Collection Time: 08/30/18 11:36 AM  Result Value Ref Range Status   SARS Coronavirus 2 NEGATIVE NEGATIVE Final    Comment: (NOTE) If result is NEGATIVE SARS-CoV-2 target nucleic acids are NOT DETECTED. The SARS-CoV-2 RNA is generally detectable in upper and lower  respiratory specimens during the acute phase of infection. The lowest  concentration of SARS-CoV-2 viral  copies this assay can detect is 250  copies / mL. A negative result does not preclude SARS-CoV-2 infection  and should not be used as the sole basis for treatment or other  patient management decisions.  A negative result may occur with  improper specimen collection / handling, submission of specimen other  than nasopharyngeal swab, presence of viral mutation(s) within the  areas targeted by this assay, and inadequate number of viral copies  (<250 copies / mL). A negative result must be combined with clinical  observations, patient history, and epidemiological information. If result is POSITIVE SARS-CoV-2 target nucleic acids are DETECTED. The SARS-CoV-2 RNA is generally detectable in upper and lower  respiratory specimens dur ing the acute phase of infection.  Positive  results are indicative of active infection with SARS-CoV-2.  Clinical  correlation with patient history and other diagnostic information is  necessary to determine patient infection status.  Positive results do  not rule out bacterial infection or co-infection with other viruses. If result is PRESUMPTIVE POSTIVE SARS-CoV-2 nucleic acids MAY BE PRESENT.   A presumptive positive result was obtained on the submitted specimen  and confirmed on repeat testing.  While 2019 novel coronavirus  (SARS-CoV-2) nucleic acids may be present in the submitted sample  additional confirmatory testing may be necessary for epidemiological  and / or clinical management purposes  to differentiate between  SARS-CoV-2 and other Sarbecovirus currently known to infect humans.  If clinically indicated additional testing with an alternate test  methodology [phone]) is advised. The SARS-CoV-2 RNA is generally  detectable in upper and lower respiratory sp ecimens during the acute  phase of infection. The expected result is Negative. Fact Sheet for Patients:  StrictlyIdeas.no Fact Sheet for Healthcare  Providers: BankingDealers.co.za This test is not yet approved or cleared by the [place] FDA and has been authorized for detection and/or diagnosis of SARS-CoV-2 by FDA under an Emergency Use Authorization (EUA).  This EUA will remain in effect (meaning this test can be used) for the duration of the COVID-19 declaration under Section 564(b)(1) of the Act, 21 U.S.C. section 360bbb-3(b)(1), unless the authorization is terminated or revoked sooner. Performed at [facility], [address] [place], [place] [address]   Blood culture (routine x 2)     Status: None (Preliminary  result)   Collection Time: 08/30/18 12:32 PM  Result Value Ref Range Status   Specimen Description LEFT ANTECUBITAL  Final   Special Requests   Final    BOTTLES DRAWN AEROBIC AND ANAEROBIC Blood Culture adequate volume   Culture   Final    NO GROWTH 3 DAYS Performed at [facility], [address] [place], [place] [address]    Report Status PENDING  Incomplete  Blood culture (routine x 2)     Status: None (Preliminary result)   Collection Time: 08/30/18 12:32 PM  Result Value Ref Range Status   Specimen Description BLOOD RIGHT FOREARM  Final   Special Requests   Final    BOTTLES DRAWN AEROBIC AND ANAEROBIC Blood Culture adequate volume   Culture   Final    NO GROWTH 3 DAYS Performed at [facility], [address] [place], [place] [address]    Report Status PENDING  Incomplete  MRSA PCR Screening     Status: None   Collection Time: 08/30/18  5:33 PM  Result Value Ref Range Status   MRSA by PCR NEGATIVE NEGATIVE Final    Comment:        The GeneXpert MRSA Assay (FDA approved for NASAL specimens only), is one component of a comprehensive MRSA colonization surveillance program. It is not intended to diagnose MRSA infection nor to guide or monitor treatment for MRSA infections. Performed at [facility], [address], [place], [place] [address]   Respiratory Panel by PCR      Status: None   Collection Time: 08/31/18  7:32 AM  Result Value Ref Range Status   Adenovirus NOT DETECTED NOT DETECTED Final   Coronavirus 229E NOT DETECTED NOT DETECTED Final    Comment: (NOTE) The Coronavirus on the Respiratory Panel, DOES NOT test for the novel  Coronavirus (2019 nCoV)    Coronavirus HKU1 NOT DETECTED NOT DETECTED Final   Coronavirus NL63 NOT DETECTED NOT DETECTED Final   Coronavirus OC43 NOT DETECTED NOT DETECTED Final   Metapneumovirus NOT DETECTED NOT DETECTED Final   Rhinovirus / Enterovirus NOT DETECTED NOT DETECTED Final   Influenza A NOT DETECTED NOT DETECTED Final   Influenza B NOT DETECTED NOT DETECTED Final   Parainfluenza Virus 1 NOT DETECTED NOT DETECTED Final   Parainfluenza Virus 2 NOT DETECTED NOT DETECTED Final   Parainfluenza Virus 3 NOT DETECTED NOT DETECTED Final   Parainfluenza Virus 4 NOT DETECTED NOT DETECTED Final   Respiratory Syncytial Virus NOT DETECTED NOT DETECTED Final   Bordetella pertussis NOT DETECTED NOT DETECTED Final   Chlamydophila pneumoniae NOT DETECTED NOT DETECTED Final   Mycoplasma pneumoniae NOT DETECTED NOT DETECTED Final    Comment: Performed at [facility], [place] [address], [place], [place] [address]   Studies/Results: US Venous Img Lower Unilateral Right  Result Date: 08/31/2018 CLINICAL DATA:  Right lower extremity edema. History of prostate cancer. Evaluate for DVT. EXAM: RIGHT LOWER EXTREMITY VENOUS DOPPLER ULTRASOUND TECHNIQUE: Gray-scale sonography with graded compression, as well as color Doppler and duplex ultrasound were performed to evaluate the lower extremity deep venous systems from the level of the common femoral vein and including the common femoral, femoral, profunda femoral, popliteal and calf veins including the posterior tibial, peroneal and gastrocnemius veins when visible. The superficial great saphenous vein was also interrogated. Spectral Doppler was utilized to evaluate flow at rest and with  distal augmentation maneuvers in the common femoral, femoral and popliteal veins. COMPARISON:  None. FINDINGS: Contralateral Common Femoral  Vein: Respiratory phasicity is normal and symmetric with the symptomatic side. No evidence of thrombus. Normal compressibility. Common Femoral Vein: No evidence of thrombus. Normal compressibility, respiratory phasicity and response to augmentation. Saphenofemoral Junction: No evidence of thrombus. Normal compressibility and flow on color Doppler imaging. Profunda Femoral Vein: No evidence of thrombus. Normal compressibility and flow on color Doppler imaging. Femoral Vein: No evidence of thrombus. Normal compressibility, respiratory phasicity and response to augmentation. Popliteal Vein: No evidence of thrombus. Normal compressibility, respiratory phasicity and response to augmentation. Calf Veins: No evidence of thrombus. Normal compressibility and flow on color Doppler imaging. Superficial Great Saphenous Vein: No evidence of thrombus. Normal compressibility. Venous Reflux:  None. Other Findings: A minimal subcutaneous edema is noted at the level right lower leg and calf. IMPRESSION: No evidence DVT within the right lower extremity. Electronically Signed   By: Sandi Mariscal M.D.   On: 08/31/2018 09:19    Medications:  Prior to Admission:  Medications Prior to Admission  Medication Sig Dispense Refill Last Dose  . albuterol (VENTOLIN HFA) 108 (90 Base) MCG/ACT inhaler Inhale 2 puffs into the lungs every 6 (six) hours as needed for wheezing or shortness of breath. 1 Inhaler 0 Taking  . atorvastatin (LIPITOR) 10 MG tablet Take 1 tablet (10 mg total) by mouth at bedtime. 90 tablet 1 Past Week at Unknown time  . B-D TB SYRINGE 1CC/26GX3/8" 26G X 3/8" 1 ML MISC USE TO INJECT methotrexate ONCE every WEEK AS DIRECTED   08/29/2018 at Unknown time  . escitalopram (LEXAPRO) 20 MG tablet Take 0.5 tablets (10 mg total) by mouth daily. 30 tablet 5 08/29/2018 at Unknown time  . folic  acid (FOLVITE) 1 MG tablet Take 1 tablet by mouth daily.   08/29/2018 at Unknown time  . leuprolide (LUPRON) 30 MG injection Inject 30 mg into the muscle every 3 (three) months.   Taking  . Multiple Vitamin (MULTIVITAMIN WITH MINERALS) TABS tablet Take 1 tablet by mouth daily. 30 tablet 0 08/29/2018 at Unknown time  . OXYGEN 4 lpm 24/7  unsure of DME   08/30/2018 at Unknown time  . predniSONE (DELTASONE) 1 MG tablet Take 5 tablets (5 mg total) by mouth daily with breakfast. 150 tablet 1 Past Week at Unknown time  . predniSONE (DELTASONE) 10 MG tablet Take  4 each am x 2 days,   2 each am x 2 days,  1 each am x 2 days and stop 14 tablet 0 08/29/2018 at Unknown time  . amLODipine (NORVASC) 5 MG tablet Take 5 mg by mouth daily.   Not Taking at Unknown time  . B Complex Vitamins (VITAMIN B-COMPLEX PO) Take 1 tablet by mouth daily.    Not Taking at Unknown time  . Cholecalciferol (VITAMIN D3) 5000 UNITS TABS Take 1-2 tablets by mouth daily.    Not Taking at Unknown time  . furosemide (LASIX) 20 MG tablet Take 20 mg by mouth daily.   Not Taking at Unknown time  . guaiFENesin (MUCINEX) 600 MG 12 hr tablet Take 2 tablets (1,200 mg total) by mouth 2 (two) times daily as needed. (Patient not taking: Reported on 08/30/2018) 14 tablet 0 Not Taking at Unknown time  . lisinopril (ZESTRIL) 10 MG tablet Take 10 mg by mouth daily.   Not Taking at Unknown time  . OLIVE LEAF PO Take 1 tablet by mouth daily.    Not Taking at Unknown time   Scheduled: . atorvastatin  10 mg Oral QHS  . budesonide (  PULMICORT) nebulizer solution  0.25 mg Nebulization BID  . Chlorhexidine Gluconate Cloth  6 each Topical Daily  . enoxaparin (LOVENOX) injection  40 mg Subcutaneous Q24H  . escitalopram  10 mg Oral Daily  . folic acid  1 mg Oral Daily  . ipratropium-albuterol  3 mL Nebulization TID  . lisinopril  10 mg Oral Daily  . methylPREDNISolone (SOLU-MEDROL) injection  60 mg Intravenous Q12H  . potassium chloride  40 mEq Oral Daily    Continuous: . sodium chloride Stopped (09/01/18 1852)  . azithromycin Stopped (09/01/18 1746)   GYF:VCBSWH chloride, acetaminophen **OR** acetaminophen, albuterol, HYDROcodone-acetaminophen, ondansetron **OR** ondansetron (ZOFRAN) IV, polyethylene glycol  Assesment: He has acute on chronic hypoxic respiratory failure which is multifactorial.  He has interstitial pulmonary disease likely from asbestos exposure but he also has rheumatoid arthritis.  He has some element of COPD.  He had a pleural effusion and he did seem to respond to Lasix and that is going to be repeated  He has been hypokalemic and that has been replenished  He is on chronic prednisone for rheumatoid Active Problems:   Acute on chronic respiratory failure ([facility])   COPD (chronic obstructive pulmonary disease) ([place])    Plan: Continue treatments.  He is going to have Lasix again and see if that will help    LOS: 1 day   Alonza Bogus 09/02/2018, 8:55 AM

## 2018-09-02 NOTE — Progress Notes (Signed)
PROGRESS NOTE    Joel Valdez  JQB:[id number] DOB: 04-04-1946 DOA: 08/30/2018 PCP: [name], PA-C   Brief Narrative:  Per HPI: Joel Almond Milleris a 73 y.o.malewith medical history significant forinterstitial lung diseasechronic respiratory failure, hypertension, rheumatoid arthritis,immunosuppression,who presented to the ED with complaints of difficulty breathing started about 3 hours prior to arrival in the ED. Reports worsening of his chronic cough also, buthis cough over the past 3 weeks has been nonproductive. Reports worsening lower extremity swelling R>L,without pain redness or warmth. No fever or chills. Also reports onset of left-sided chest pain that started this morningas hewoke up. Pain is radiating, worse with movement. Patient reports pain at this time improved after he was given some medications in the ED (aspirin). Reports chest pain is similar to chest pain he had recently requiring hospitalization. During her tele-visit with his primary care provider today, patient was winded on interview, withpatient's O2 at that time on 4.5 L was 96%,but later dropped to 77%. EMS gave 1 rate 25 mg of Solu-Medrol prior to arrival in the ED  Patient was admitted with acute on chronic hypoxemic respiratory failure likely secondary to acute bronchitis/atypical infection with possible associated flare of his advanced interstitial fibrosis. He is also noted to have bilateral pleural effusions and has been started on IV Solu-Medrol as well as DuoNebs and azithromycin along with Lasix. He was notably recently hospitalized for multifocal pneumonia in early April and was intubated at that time and had a complication of small pneumothorax which was treated with chest tube. He was also noted to have chest pain later in April with unremarkable echocardiogram and was discharged home on 4 to 6 L of oxygen.  Patient has no evidence of PE on CTA chest and right lower extremity  Doppler negative for any acute findings.  He has diuresed well overnight on 5/8 and is noted to have some hypokalemia on the morning of 5/9.  Requesting SNF/rehab on discharge.  He has not participated with PT as of yet and still continues to remain on 7 L nasal cannula with significant hypoxemia on exertion.   Assessment & Plan:   Active Problems:   Acute on chronic respiratory failure (HCC)   COPD (chronic obstructive pulmonary disease) ([place])   Acute on chronichypoxicrespiratory failure-increasing dyspnea,worsening drycough,initial hypoxemia reported at home 77% O2 sats on 4.5 L,the time of my evaluation O2 sats greater than 90% on 3.5 L.Likely secondary to acute bronchitis/typical infectionwithpossibleflare of his advanced interstitial fibrosis. As suggested on CTAchest.Also new trace left pleural effusion, on my view his right pleural effusion reported as enlarging is Small and not enough to account forthisacute worsening of his respiratory status.125 mg Solu-Medrol given by EMS. SARS-CoV-2 test negative. -Continue IV Solu-Medrol 60 mg twice dailyfor now and wean O2-appreciate pulmonology with steroid weaning -Leukocytosis may be related to steroids.  Procalcitonin and respiratory panel negative -IV azithromycin to continue for now per pulmonology -Respiratory protocol, supplemental O2 - Obtain ABG-pH 7.4, PCO2 33, PaO2 66.9 - Mucolytics,duo nebs scheduled, albuterol as needed - Pulmology consultappreciated  -Repeat Lasix dose today and maintain on potassium supplementation-monitor strict I's and O's  Chest pain-atypical, appears musculoskeletal.Recentadmission for same ruled out with EKG and troponins with echo. Mild elevated troponin-0.05,likelydemand 2/2acute respiratory distress. EKG unchanged from prior. -EKGwith normal sinus rhythm and no changes consistent with ACS noted this a.m. -Flat troponin trend noted. No suspicion for ACS. Likely demand  ischemia. -Received Toradol yesterday with improvement noted.  Right lower extremity swelling-history of cor  pulmonale.Recent echo 07/2018-EF>65%. -Home Medication Lasix 20 mg will hold for now and give 1 more dose of IV Lasix. - RightLower extremity venous Dopplers negative for DVT  Interstitial lung disease flare- Likelycontributing to his acute on chronic respiratory failure. Currently on 3.5 L at home with sats greater than 90%.Follows with Dr. Melvyn Novas.  Rheumatoidarthritis-appears stable. Home medications methotrexate andleuprolide injections every 3 months.  Also appears to be on prednisone.  Hypertension-bloodpressuressystolic soft tostable.Norvasc recently d/c'd by PCP. -Blood pressure stable and improved this a.m.  Will restart home lisinopril today.  Hypokalemia-resolved -Maintain on oral repletion while on Lasix today -Recheck in a.m. along with magnesium   DVT prophylaxis:Lovenox Code Status:Full Family Communication:None at bedside, will plan to call wife to update later Disposition Plan:Per pulmonology recommendations.   Further diuresis with repeat Lasix dose today.  Wean oxygen as tolerated.  PT evaluation pending.   Consultants:  Pulmonology  Procedures:  None  Antimicrobials:  IV azithromycin 5/7->  Subjective: Patient seen and evaluated today with no new acute complaints or concerns. No acute concerns or events noted overnight.  He did not sleep well overnight and did not participate with physical therapy yesterday.  He continues to remain on 7 L nasal cannula.  Objective: Vitals:   09/02/18 0400 09/02/18 0500 09/02/18 0600 09/02/18 0713  BP: (!) 121/52 136/76 134/81   Pulse: 86 85 85 77  Resp: 19 (!) 31 20 15   Temp: 98.6 F (37 C)   98.4 F (36.9 C)  TempSrc:    Oral  SpO2: 99% 98% 100% 100%  Weight:  68.9 kg    Height:        Intake/Output Summary (Last 24 hours) at 09/02/2018 0722 Last data filed at 09/02/2018  0600 Gross per 24 hour  Intake 1604.34 ml  Output 675 ml  Net 929.34 ml   Filed Weights   08/31/18 0646 09/01/18 0411 09/02/18 0500  Weight: 69.2 kg 68 kg 68.9 kg    Examination:  General exam: Appears calm and comfortable  Respiratory system: Clear to auscultation. Respiratory effort normal. Cardiovascular system: S1 & S2 heard, RRR. No JVD, murmurs, rubs, gallops or clicks. No pedal edema. Gastrointestinal system: Abdomen is nondistended, soft and nontender. No organomegaly or masses felt. Normal bowel sounds heard. Central nervous system: Alert and oriented. No focal neurological deficits. Extremities: Symmetric 5 x 5 power. Skin: No rashes, lesions or ulcers Psychiatry: Judgement and insight appear normal. Mood & affect appropriate.     Data Reviewed: I have personally reviewed following labs and imaging studies  CBC: Recent Labs  Lab 08/29/18 1052 08/30/18 1232 09/01/18 0439  WBC 14.4* 10.0 13.2*  NEUTROABS 11.2* 8.7*  --   HGB 10.6* 10.4* 9.1*  HCT 32.3* 32.6* 28.1*  MCV 87.2 89.1 88.6  PLT 217.0 202 283   Basic Metabolic Panel: Recent Labs  Lab 08/29/18 1052 08/30/18 1232 09/01/18 0439 09/02/18 0433  NA 138 139 140 140  K 3.3* 3.8 2.9* 4.5  CL 99 100 102 103  CO2 28 25 27 29   GLUCOSE 91 92 158* 156*  BUN 17 13 19 21   CREATININE 1.02 0.92 0.95 0.85  CALCIUM 8.3* 8.8* 8.3* 8.3*  MG  --   --  2.0 2.0   GFR: Estimated Creatinine Clearance: 69.8 mL/min (by C-G formula based on SCr of 0.85 mg/dL). Liver Function Tests: Recent Labs  Lab 08/29/18 1052 08/30/18 1232  AST 11 17  ALT 19 23  ALKPHOS 42 48  BILITOT 0.5 0.7  PROT 6.2 6.9  ALBUMIN 3.3* 3.2*   No results for input(s): LIPASE, AMYLASE in the last 168 hours. No results for input(s): AMMONIA in the last 168 hours. Coagulation Profile: No results for input(s): INR, PROTIME in the last 168 hours. Cardiac Enzymes: Recent Labs  Lab 08/30/18 1232 08/30/18 1818 08/31/18 0019  TROPONINI  0.05* 0.05* 0.04*   BNP (last 3 results) Recent Labs    08/29/18 1052  PROBNP 96.0   HbA1C: No results for input(s): HGBA1C in the last 72 hours. CBG: No results for input(s): GLUCAP in the last 168 hours. Lipid Profile: No results for input(s): CHOL, HDL, LDLCALC, TRIG, CHOLHDL, LDLDIRECT in the last 72 hours. Thyroid Function Tests: No results for input(s): TSH, T4TOTAL, FREET4, T3FREE, THYROIDAB in the last 72 hours. Anemia Panel: No results for input(s): VITAMINB12, FOLATE, FERRITIN, TIBC, IRON, RETICCTPCT in the last 72 hours. Sepsis Labs: Recent Labs  Lab 08/31/18 0019  PROCALCITON 0.15    Recent Results (from the past 240 hour(s))  SARS Coronavirus 2 (CEPHEID- Performed in [place] hospital lab), Hosp Order     Status: None   Collection Time: 08/30/18 11:36 AM  Result Value Ref Range Status   SARS Coronavirus 2 NEGATIVE NEGATIVE Final    Comment: (NOTE) If result is NEGATIVE SARS-CoV-2 target nucleic acids are NOT DETECTED. The SARS-CoV-2 RNA is generally detectable in upper and lower  respiratory specimens during the acute phase of infection. The lowest  concentration of SARS-CoV-2 viral copies this assay can detect is 250  copies / mL. A negative result does not preclude SARS-CoV-2 infection  and should not be used as the sole basis for treatment or other  patient management decisions.  A negative result may occur with  improper specimen collection / handling, submission of specimen other  than nasopharyngeal swab, presence of viral mutation(s) within the  areas targeted by this assay, and inadequate number of viral copies  (<250 copies / mL). A negative result must be combined with clinical  observations, patient history, and epidemiological information. If result is POSITIVE SARS-CoV-2 target nucleic acids are DETECTED. The SARS-CoV-2 RNA is generally detectable in upper and lower  respiratory specimens dur ing the acute phase of infection.  Positive    results are indicative of active infection with SARS-CoV-2.  Clinical  correlation with patient history and other diagnostic information is  necessary to determine patient infection status.  Positive results do  not rule out bacterial infection or co-infection with other viruses. If result is PRESUMPTIVE POSTIVE SARS-CoV-2 nucleic acids MAY BE PRESENT.   A presumptive positive result was obtained on the submitted specimen  and confirmed on repeat testing.  While 2019 novel coronavirus  (SARS-CoV-2) nucleic acids may be present in the submitted sample  additional confirmatory testing may be necessary for epidemiological  and / or clinical management purposes  to differentiate between  SARS-CoV-2 and other Sarbecovirus currently known to infect humans.  If clinically indicated additional testing with an alternate test  methodology [phone]) is advised. The SARS-CoV-2 RNA is generally  detectable in upper and lower respiratory sp ecimens during the acute  phase of infection. The expected result is Negative. Fact Sheet for Patients:  StrictlyIdeas.no Fact Sheet for Healthcare Providers: BankingDealers.co.za This test is not yet approved or cleared by the [place] FDA and has been authorized for detection and/or diagnosis of SARS-CoV-2 by FDA under an Emergency Use Authorization (EUA).  This EUA will remain in effect (meaning this test can be used)  for the duration of the COVID-19 declaration under Section 564(b)(1) of the Act, 21 U.S.C. section 360bbb-3(b)(1), unless the authorization is terminated or revoked sooner. Performed at [facility], [address], [place], [place] [address]   Blood culture (routine x 2)     Status: None (Preliminary result)   Collection Time: 08/30/18 12:32 PM  Result Value Ref Range Status   Specimen Description LEFT ANTECUBITAL  Final   Special Requests   Final    BOTTLES DRAWN AEROBIC AND ANAEROBIC  Blood Culture adequate volume   Culture   Final    NO GROWTH 3 DAYS Performed at [facility], [address] [place], [place] [address]    Report Status PENDING  Incomplete  Blood culture (routine x 2)     Status: None (Preliminary result)   Collection Time: 08/30/18 12:32 PM  Result Value Ref Range Status   Specimen Description BLOOD RIGHT FOREARM  Final   Special Requests   Final    BOTTLES DRAWN AEROBIC AND ANAEROBIC Blood Culture adequate volume   Culture   Final    NO GROWTH 3 DAYS Performed at [facility], [address] [place], [place] [address]    Report Status PENDING  Incomplete  MRSA PCR Screening     Status: None   Collection Time: 08/30/18  5:33 PM  Result Value Ref Range Status   MRSA by PCR NEGATIVE NEGATIVE Final    Comment:        The GeneXpert MRSA Assay (FDA approved for NASAL specimens only), is one component of a comprehensive MRSA colonization surveillance program. It is not intended to diagnose MRSA infection nor to guide or monitor treatment for MRSA infections. Performed at [facility], [address], [place], [place] [address]   Respiratory Panel by PCR     Status: None   Collection Time: 08/31/18  7:32 AM  Result Value Ref Range Status   Adenovirus NOT DETECTED NOT DETECTED Final   Coronavirus 229E NOT DETECTED NOT DETECTED Final    Comment: (NOTE) The Coronavirus on the Respiratory Panel, DOES NOT test for the novel  Coronavirus (2019 nCoV)    Coronavirus HKU1 NOT DETECTED NOT DETECTED Final   Coronavirus NL63 NOT DETECTED NOT DETECTED Final   Coronavirus OC43 NOT DETECTED NOT DETECTED Final   Metapneumovirus NOT DETECTED NOT DETECTED Final   Rhinovirus / Enterovirus NOT DETECTED NOT DETECTED Final   Influenza A NOT DETECTED NOT DETECTED Final   Influenza B NOT DETECTED NOT DETECTED Final   Parainfluenza Virus 1 NOT DETECTED NOT DETECTED Final   Parainfluenza Virus 2 NOT DETECTED NOT DETECTED Final   Parainfluenza Virus 3 NOT DETECTED  NOT DETECTED Final   Parainfluenza Virus 4 NOT DETECTED NOT DETECTED Final   Respiratory Syncytial Virus NOT DETECTED NOT DETECTED Final   Bordetella pertussis NOT DETECTED NOT DETECTED Final   Chlamydophila pneumoniae NOT DETECTED NOT DETECTED Final   Mycoplasma pneumoniae NOT DETECTED NOT DETECTED Final    Comment: Performed at [facility], [place] [address] [place], [place] [address]         Radiology Studies: US Venous Img Lower Unilateral Right  Result Date: 08/31/2018 CLINICAL DATA:  Right lower extremity edema. History of prostate cancer. Evaluate for DVT. EXAM: RIGHT LOWER EXTREMITY VENOUS DOPPLER ULTRASOUND TECHNIQUE: Gray-scale sonography with graded compression, as well as color Doppler and duplex ultrasound were performed to evaluate the lower extremity deep venous systems from the level of the common femoral vein and including  the common femoral, femoral, profunda femoral, popliteal and calf veins including the posterior tibial, peroneal and gastrocnemius veins when visible. The superficial great saphenous vein was also interrogated. Spectral Doppler was utilized to evaluate flow at rest and with distal augmentation maneuvers in the common femoral, femoral and popliteal veins. COMPARISON:  None. FINDINGS: Contralateral Common Femoral Vein: Respiratory phasicity is normal and symmetric with the symptomatic side. No evidence of thrombus. Normal compressibility. Common Femoral Vein: No evidence of thrombus. Normal compressibility, respiratory phasicity and response to augmentation. Saphenofemoral Junction: No evidence of thrombus. Normal compressibility and flow on color Doppler imaging. Profunda Femoral Vein: No evidence of thrombus. Normal compressibility and flow on color Doppler imaging. Femoral Vein: No evidence of thrombus. Normal compressibility, respiratory phasicity and response to augmentation. Popliteal Vein: No evidence of thrombus. Normal compressibility, respiratory  phasicity and response to augmentation. Calf Veins: No evidence of thrombus. Normal compressibility and flow on color Doppler imaging. Superficial Great Saphenous Vein: No evidence of thrombus. Normal compressibility. Venous Reflux:  None. Other Findings: A minimal subcutaneous edema is noted at the level right lower leg and calf. IMPRESSION: No evidence DVT within the right lower extremity. Electronically Signed   By: Sandi Mariscal M.D.   On: 08/31/2018 09:19        Scheduled Meds:  atorvastatin  10 mg Oral QHS   budesonide (PULMICORT) nebulizer solution  0.25 mg Nebulization BID   Chlorhexidine Gluconate Cloth  6 each Topical Daily   enoxaparin (LOVENOX) injection  40 mg Subcutaneous Q24H   escitalopram  10 mg Oral Daily   folic acid  1 mg Oral Daily   ipratropium-albuterol  3 mL Nebulization TID   lisinopril  10 mg Oral Daily   methylPREDNISolone (SOLU-MEDROL) injection  60 mg Intravenous Q12H   Continuous Infusions:  sodium chloride Stopped (09/01/18 1852)   azithromycin Stopped (09/01/18 1746)     LOS: 1 day    Time spent: 30 minutes    Mikyla Schachter Darleen Crocker, DO [facility] Pager [phone]  If 7PM-7AM, please contact night-coverage [email or web address] Password [email or web address] 09/02/2018, 7:22 AM

## 2018-09-03 LAB — CBC
HCT: 34.2 % — ABNORMAL LOW (ref 39.0–52.0)
Hemoglobin: 10.4 g/dL — ABNORMAL LOW (ref 13.0–17.0)
MCH: 27.3 pg (ref 26.0–34.0)
MCHC: 30.4 g/dL (ref 30.0–36.0)
MCV: 89.8 fL (ref 80.0–100.0)
Platelets: 319 10*3/uL (ref 150–400)
RBC: 3.81 MIL/uL — ABNORMAL LOW (ref 4.22–5.81)
RDW: 17.6 % — ABNORMAL HIGH (ref 11.5–15.5)
WBC: 13.6 10*3/uL — ABNORMAL HIGH (ref 4.0–10.5)
nRBC: 0.1 % (ref 0.0–0.2)

## 2018-09-03 LAB — BASIC METABOLIC PANEL
Anion gap: 10 (ref 5–15)
BUN: 26 mg/dL — ABNORMAL HIGH (ref 8–23)
CO2: 26 mmol/L (ref 22–32)
Calcium: 8 mg/dL — ABNORMAL LOW (ref 8.9–10.3)
Chloride: 100 mmol/L (ref 98–111)
Creatinine, Ser: 0.98 mg/dL (ref 0.61–1.24)
GFR calc Af Amer: >60 mL/min (ref >60)
GFR calc non Af Amer: >60 mL/min (ref >60)
Glucose, Bld: 135 mg/dL — ABNORMAL HIGH (ref 70–99)
Potassium: 4.5 mmol/L (ref 3.5–5.1)
Sodium: 136 mmol/L (ref 135–145)

## 2018-09-03 LAB — MAGNESIUM: Magnesium: 2 mg/dL (ref 1.7–2.4)

## 2018-09-03 MED ORDER — GUAIFENESIN-DM 100-10 MG/5ML PO SYRP
5.0000 mL | ORAL_SOLUTION | ORAL | Status: DC | PRN
  Administered 2018-09-03: 05:00:00 5 mL via ORAL
  Filled 2018-09-03 (×2): qty 5

## 2018-09-03 MED ORDER — FUROSEMIDE 20 MG PO TABS
20.0000 mg | ORAL_TABLET | Freq: Every day | ORAL | Status: DC
  Administered 2018-09-03 – 2018-09-05 (×3): 20 mg via ORAL
  Filled 2018-09-03 (×3): qty 1

## 2018-09-03 NOTE — Clinical Social Work Note (Signed)
Linda from [facility] reported back that she had smoothed things out with Mrs Koeppen.  Called [place] at [facility] to let him know of concerns as expressed by wife, and he stated he would follow up.

## 2018-09-03 NOTE — Evaluation (Signed)
Physical Therapy Evaluation Patient Details Name: Joel Valdez MRN: [id number] DOB: [birth year] Today's Date: 09/03/2018   History of Present Illness  Joel Valdez is a 73 y.o. male with medical history significant for interstitial lung disease chronic respiratory failure, hypertension, rheumatoid arthritis, immunosuppression, who presented to the ED with complaints of difficulty breathing started about 3 hours prior to arrival in the ED.  Reports worsening of his chronic cough also, but his cough over the past 3 weeks has been nonproductive.  Reports worsening lower extremity swelling R>L, without pain redness or warmth.  No fever or chills.    Clinical Impression  Patient functioning near baseline for functional mobility and gait, mostly limited due to SOB with SpO2 dropping to 85% while on room air seated at bedside, required 3 LPM O2 for ambulation in hallway with SpO2 between 85-89% and had to take 4-5 minute rest break due to SOB after walking.  Patient transferred to wheelchair to be taken to 300 floor by nursing staff after therapy.  Patient will benefit from continued physical therapy in hospital and recommended venue below to increase strength, balance, endurance for safe ADLs and gait.     Follow Up Recommendations Home health PT;Outpatient PT    Equipment Recommendations  None recommended by PT    Recommendations for Other Services       Precautions / Restrictions Precautions Precautions: Fall Precaution Comments: monitor sats  Restrictions Weight Bearing Restrictions: No      Mobility  Bed Mobility Overal bed mobility: Modified Independent                Transfers Overall transfer level: Modified independent                  Ambulation/Gait Ambulation/Gait assistance: Supervision Gait Distance (Feet): 100 Feet Assistive device: None Gait Pattern/deviations: Decreased step length - right;Decreased step length - left;Decreased stride length Gait  velocity: decreased   General Gait Details: slightly labored cadence with frequent leaning on counter tops for support, no loss of balance, on 3 LPM with SpO2 below 90%  Stairs            Wheelchair Mobility    Modified Rankin (Stroke Patients Only)       Balance Overall balance assessment: Needs assistance Sitting-balance support: Feet supported;No upper extremity supported Sitting balance-Leahy Scale: Good     Standing balance support: During functional activity;No upper extremity supported Standing balance-Leahy Scale: Fair                               Pertinent Vitals/Pain Pain Assessment: 0-10 Pain Score: 8  Pain Location: left side upper chest Pain Descriptors / Indicators: Sore Pain Intervention(s): Limited activity within patient's tolerance;Monitored during session    Home Living   Living Arrangements: Spouse/significant other Available Help at Discharge: Family Type of Home: House Home Access: Stairs to enter Entrance Stairs-Rails: Right Entrance Stairs-Number of Steps: 3 steps into back, 1 step into front Home Layout: One level Home Equipment: Grab bars - tub/shower;Walker - 2 wheels;Cane - single point;Shower seat;Bedside commode Additional Comments: Lives with wife who has COPD, and her cousin who is on continuous 02    Prior Function Level of Independence: Independent         Comments: Hydrographic surveyor, drives     Journalist, newspaper        Extremity/Trunk Assessment   Upper Extremity Assessment Upper Extremity Assessment: Overall WFL for  tasks assessed    Lower Extremity Assessment Lower Extremity Assessment: Generalized weakness    Cervical / Trunk Assessment Cervical / Trunk Assessment: Normal  Communication   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)  Cognition Arousal/Alertness: Awake/alert Behavior During Therapy: WFL for tasks assessed/performed Overall Cognitive Status: Within Functional Limits for tasks  assessed                                        General Comments      Exercises     Assessment/Plan    PT Assessment Patient needs continued PT services  PT Problem List Decreased strength;Decreased activity tolerance;Decreased balance;Decreased mobility       PT Treatment Interventions Therapeutic exercise;Gait training;Stair training;Functional mobility training;Therapeutic activities;Patient/family education    PT Goals (Current goals can be found in the Care Plan section)  Acute Rehab PT Goals Patient Stated Goal: return home PT Goal Formulation: With patient Time For Goal Achievement: 09/10/18 Potential to Achieve Goals: Good    Frequency Min 3X/week   Barriers to discharge        Co-evaluation               AM-PAC PT "6 Clicks" Mobility  Outcome Measure Help needed turning from your back to your side while in a flat bed without using bedrails?: None Help needed moving from lying on your back to sitting on the side of a flat bed without using bedrails?: None Help needed moving to and from a bed to a chair (including a wheelchair)?: None Help needed standing up from a chair using your arms (e.g., wheelchair or bedside chair)?: None Help needed to walk in hospital room?: A Little Help needed climbing 3-5 steps with a railing? : A Little 6 Click Score: 22    End of Session Equipment Utilized During Treatment: Oxygen Activity Tolerance: Patient tolerated treatment well;Patient limited by fatigue(Patient limited by SOB and SpO2 desaturation) Patient left: in chair;with call bell/phone within reach;with nursing/sitter in room Nurse Communication: Mobility status PT Visit Diagnosis: Unsteadiness on feet (R26.81);Other abnormalities of gait and mobility (R26.89);Muscle weakness (generalized) (M62.81)    Time: 3559-7416 PT Time Calculation (min) (ACUTE ONLY): 22 min   Charges:   PT Evaluation $PT Eval Moderate Complexity: 1 Mod PT  Treatments $Therapeutic Activity: 23-37 mins        1:59 PM, 09/03/18 Lonell Grandchild, MPT Physical Therapist with [facility] [phone] office [phone] mobile phone

## 2018-09-03 NOTE — Progress Notes (Signed)
PROGRESS NOTE    Joel Valdez  BOF:[id number] DOB: 1945/11/13 DOA: 08/30/2018 PCP: [name], PA-C   Brief Narrative:  Per HPI: Joel Valdez a 73 y.o.malewith medical history significant forinterstitial lung diseasechronic respiratory failure, hypertension, rheumatoid arthritis,immunosuppression,who presented to the ED with complaints of difficulty breathing started about 3 hours prior to arrival in the ED. Reports worsening of his chronic cough also, buthis cough over the past 3 weeks has been nonproductive. Reports worsening lower extremity swelling R>L,without pain redness or warmth. No fever or chills. Also reports onset of left-sided chest pain that started this morningas hewoke up. Pain is radiating, worse with movement. Patient reports pain at this time improved after he was given some medications in the ED (aspirin). Reports chest pain is similar to chest pain he had recently requiring hospitalization. During her tele-visit with his primary care provider today, patient was winded on interview, withpatient's O2 at that time on 4.5 L was 96%,but later dropped to 77%. EMS gave 1 rate 25 mg of Solu-Medrol prior to arrival in the ED  Patient was admitted with acute on chronic hypoxemic respiratory failure likely secondary to acute bronchitis/atypical infection with possible associated flare of his advanced interstitial fibrosis. He is also noted to have bilateral pleural effusions and has been started on IV Solu-Medrol as well as DuoNebs and azithromycin along with Lasix. He was notably recently hospitalized for multifocal pneumonia in early April and was intubated at that time and had a complication of small pneumothorax which was treated with chest tube. He was also noted to have chest pain later in April with unremarkable echocardiogram and was discharged home on 4 to 6 L of oxygen.  Patient has no evidence of PE on CTA chest and right lower extremity  Doppler negative for any acute findings. He has diuresed well overnight on 5/8 and is noted to have some hypokalemia on the morning of 5/9. Requesting SNF/rehab on discharge.  He has not participated with PT as of yet, but is continued to have some respiratory status improvements with diuresis and current medications.  We will plan to transfer to [place] today.   Assessment & Plan:   Active Problems:   Acute on chronic respiratory failure (HCC)   COPD (chronic obstructive pulmonary disease) ([place])   Acute on chronichypoxicrespiratory failure-increasing dyspnea,worsening drycough,initial hypoxemia reported at home 77% O2 sats on 4.5 L,the time of my evaluation O2 sats greater than 90% on 3.5 L.Likely secondary to acute bronchitis/typical infectionwithpossibleflare of his advanced interstitial fibrosis. As suggested on CTAchest.Also new trace left pleural effusion, on my view his right pleural effusion reported as enlarging is Small and not enough to account forthisacute worsening of his respiratory status.125 mg Solu-Medrol given by EMS. SARS-CoV-2 test negative. -Continue IV Solu-Medrol 60 mg twice dailyfor now and wean O2-appreciate pulmonology with steroid weaning -Leukocytosismay be related to steroids. Procalcitonin and respiratory panel negative -IV azithromycinto continue for now per pulmonology -Respiratory protocol, supplemental O2 - Obtain ABG-pH 7.4, PCO2 33, PaO2 66.9 - Mucolytics,duo nebs scheduled, albuterol as needed - Pulmology consultappreciated  -Start home Lasix oral today-monitor strict I's and O's  Chest pain-atypical, appears musculoskeletal.Recentadmission for same ruled out with EKG and troponins with echo. Mild elevated troponin-0.05,likelydemand 2/2acute respiratory distress. EKG unchanged from prior. -EKGwith normal sinus rhythm and no changes consistent with ACS noted this a.m. -Flat troponin trend noted. No suspicion for ACS.  Likely demand ischemia. -Received Toradol yesterday with improvement noted. -Patient on Norco every 6 hours.  Right  lower extremity swelling-history of cor pulmonale.Recent echo 07/2018-EF>65%. -Restart home Lasix. - RightLower extremity venous Dopplersnegative for DVT  Interstitial lung diseaseflare- Likelycontributing to his acute on chronic respiratory failure. Currently on 3.5 L at [facility] sats greater than 90%.Follows with Dr. Melvyn Novas.  Rheumatoidarthritis-appears stable. Home medications methotrexate andleuprolide injections every 3 months.Also appears to be on prednisone.  Hypertension-bloodpressuressystolic soft tostable.Norvasc recently d/c'd by PCP. -Blood pressure stable and improved this a.m. continue home lisinopril. -Continue home Lasix.  Hypokalemia-resolved -Maintain on oral repletion while on Lasix today -Recheck in a.m. along with magnesium   DVT prophylaxis:Lovenox Code Status:Full Family Communication:None at bedside, will plan to call wife to update later Disposition Plan:Per pulmonology recommendations.    Restart home Lasix today and avoid further IV doses and monitor a.m. labs.  Transfer to floor. Wean oxygen as tolerated. PT evaluation pending.   Consultants:  Pulmonology  Procedures:  None  Antimicrobials:  IV azithromycin 5/7->  Subjective: Patient seen and evaluated today with no new acute complaints or concerns. No acute concerns or events noted overnight.  He appears to have diuresed well and is feeling somewhat better this morning.  Objective: Vitals:   09/03/18 0400 09/03/18 0500 09/03/18 0723 09/03/18 0733  BP: (!) 149/73 119/67    Pulse: 83 92    Resp:      Temp: 97.6 F (36.4 C)   97.6 F (36.4 C)  TempSrc:    Oral  SpO2: 100% 92% 100% 100%  Weight:  68.9 kg    Height:        Intake/Output Summary (Last 24 hours) at 09/03/2018 0838 Last data filed at 09/03/2018 0400 Gross per 24 hour   Intake -  Output 1550 ml  Net -1550 ml   Filed Weights   09/01/18 0411 09/02/18 0500 09/03/18 0500  Weight: 68 kg 68.9 kg 68.9 kg    Examination:  General exam: Appears calm and comfortable  Respiratory system: Clear to auscultation. Respiratory effort normal.  Currently on 5 L nasal cannula. Cardiovascular system: S1 & S2 heard, RRR. No JVD, murmurs, rubs, gallops or clicks. No pedal edema. Gastrointestinal system: Abdomen is nondistended, soft and nontender. No organomegaly or masses felt. Normal bowel sounds heard. Central nervous system: Alert and oriented. No focal neurological deficits. Extremities: Symmetric 5 x 5 power. Skin: No rashes, lesions or ulcers Psychiatry: Judgement and insight appear normal. Mood & affect appropriate.     Data Reviewed: I have personally reviewed following labs and imaging studies  CBC: Recent Labs  Lab 08/29/18 1052 08/30/18 1232 09/01/18 0439 09/03/18 0514  WBC 14.4* 10.0 13.2* 13.6*  NEUTROABS 11.2* 8.7*  --   --   HGB 10.6* 10.4* 9.1* 10.4*  HCT 32.3* 32.6* 28.1* 34.2*  MCV 87.2 89.1 88.6 89.8  PLT 217.0 202 230 517   Basic Metabolic Panel: Recent Labs  Lab 08/29/18 1052 08/30/18 1232 09/01/18 0439 09/02/18 0433 09/03/18 0514  NA 138 139 140 140 136  K 3.3* 3.8 2.9* 4.5 4.5  CL 99 100 102 103 100  CO2 28 25 27 29 26   GLUCOSE 91 92 158* 156* 135*  BUN 17 13 19 21  26*  CREATININE 1.02 0.92 0.95 0.85 0.98  CALCIUM 8.3* 8.8* 8.3* 8.3* 8.0*  MG  --   --  2.0 2.0 2.0   GFR: Estimated Creatinine Clearance: 60.6 mL/min (by C-G formula based on SCr of 0.98 mg/dL). Liver Function Tests: Recent Labs  Lab 08/29/18 1052 08/30/18 1232  AST 11 17  ALT 19  23  ALKPHOS 42 48  BILITOT 0.5 0.7  PROT 6.2 6.9  ALBUMIN 3.3* 3.2*   No results for input(s): LIPASE, AMYLASE in the last 168 hours. No results for input(s): AMMONIA in the last 168 hours. Coagulation Profile: No results for input(s): INR, PROTIME in the last 168  hours. Cardiac Enzymes: Recent Labs  Lab 08/30/18 1232 08/30/18 1818 08/31/18 0019  TROPONINI 0.05* 0.05* 0.04*   BNP (last 3 results) Recent Labs    08/29/18 1052  PROBNP 96.0   HbA1C: No results for input(s): HGBA1C in the last 72 hours. CBG: No results for input(s): GLUCAP in the last 168 hours. Lipid Profile: No results for input(s): CHOL, HDL, LDLCALC, TRIG, CHOLHDL, LDLDIRECT in the last 72 hours. Thyroid Function Tests: No results for input(s): TSH, T4TOTAL, FREET4, T3FREE, THYROIDAB in the last 72 hours. Anemia Panel: No results for input(s): VITAMINB12, FOLATE, FERRITIN, TIBC, IRON, RETICCTPCT in the last 72 hours. Sepsis Labs: Recent Labs  Lab 08/31/18 0019  PROCALCITON 0.15    Recent Results (from the past 240 hour(s))  SARS Coronavirus 2 (CEPHEID- Performed in [place] hospital lab), Hosp Order     Status: None   Collection Time: 08/30/18 11:36 AM  Result Value Ref Range Status   SARS Coronavirus 2 NEGATIVE NEGATIVE Final    Comment: (NOTE) If result is NEGATIVE SARS-CoV-2 target nucleic acids are NOT DETECTED. The SARS-CoV-2 RNA is generally detectable in upper and lower  respiratory specimens during the acute phase of infection. The lowest  concentration of SARS-CoV-2 viral copies this assay can detect is 250  copies / mL. A negative result does not preclude SARS-CoV-2 infection  and should not be used as the sole basis for treatment or other  patient management decisions.  A negative result may occur with  improper specimen collection / handling, submission of specimen other  than nasopharyngeal swab, presence of viral mutation(s) within the  areas targeted by this assay, and inadequate number of viral copies  (<250 copies / mL). A negative result must be combined with clinical  observations, patient history, and epidemiological information. If result is POSITIVE SARS-CoV-2 target nucleic acids are DETECTED. The SARS-CoV-2 RNA is generally  detectable in upper and lower  respiratory specimens dur ing the acute phase of infection.  Positive  results are indicative of active infection with SARS-CoV-2.  Clinical  correlation with patient history and other diagnostic information is  necessary to determine patient infection status.  Positive results do  not rule out bacterial infection or co-infection with other viruses. If result is PRESUMPTIVE POSTIVE SARS-CoV-2 nucleic acids MAY BE PRESENT.   A presumptive positive result was obtained on the submitted specimen  and confirmed on repeat testing.  While 2019 novel coronavirus  (SARS-CoV-2) nucleic acids may be present in the submitted sample  additional confirmatory testing may be necessary for epidemiological  and / or clinical management purposes  to differentiate between  SARS-CoV-2 and other Sarbecovirus currently known to infect humans.  If clinically indicated additional testing with an alternate test  methodology [phone]) is advised. The SARS-CoV-2 RNA is generally  detectable in upper and lower respiratory sp ecimens during the acute  phase of infection. The expected result is Negative. Fact Sheet for Patients:  StrictlyIdeas.no Fact Sheet for Healthcare Providers: BankingDealers.co.za This test is not yet approved or cleared by the [place] FDA and has been authorized for detection and/or diagnosis of SARS-CoV-2 by FDA under an Emergency Use Authorization (EUA).  This EUA will  remain in effect (meaning this test can be used) for the duration of the COVID-19 declaration under Section 564(b)(1) of the Act, 21 U.S.C. section 360bbb-3(b)(1), unless the authorization is terminated or revoked sooner. Performed at [facility], [address], [place], [place] [address]   Blood culture (routine x 2)     Status: None (Preliminary result)   Collection Time: 08/30/18 12:32 PM  Result Value Ref Range Status   Specimen  Description LEFT ANTECUBITAL  Final   Special Requests   Final    BOTTLES DRAWN AEROBIC AND ANAEROBIC Blood Culture adequate volume   Culture   Final    NO GROWTH 4 DAYS Performed at [facility], [address], [place], [place] [address]    Report Status PENDING  Incomplete  Blood culture (routine x 2)     Status: None (Preliminary result)   Collection Time: 08/30/18 12:32 PM  Result Value Ref Range Status   Specimen Description BLOOD RIGHT FOREARM  Final   Special Requests   Final    BOTTLES DRAWN AEROBIC AND ANAEROBIC Blood Culture adequate volume   Culture   Final    NO GROWTH 4 DAYS Performed at [facility], [address], [place], [place] [address]    Report Status PENDING  Incomplete  MRSA PCR Screening     Status: None   Collection Time: 08/30/18  5:33 PM  Result Value Ref Range Status   MRSA by PCR NEGATIVE NEGATIVE Final    Comment:        The GeneXpert MRSA Assay (FDA approved for NASAL specimens only), is one component of a comprehensive MRSA colonization surveillance program. It is not intended to diagnose MRSA infection nor to guide or monitor treatment for MRSA infections. Performed at [facility], [address], [place], [place] [address]   Respiratory Panel by PCR     Status: None   Collection Time: 08/31/18  7:32 AM  Result Value Ref Range Status   Adenovirus NOT DETECTED NOT DETECTED Final   Coronavirus 229E NOT DETECTED NOT DETECTED Final    Comment: (NOTE) The Coronavirus on the Respiratory Panel, DOES NOT test for the novel  Coronavirus (2019 nCoV)    Coronavirus HKU1 NOT DETECTED NOT DETECTED Final   Coronavirus NL63 NOT DETECTED NOT DETECTED Final   Coronavirus OC43 NOT DETECTED NOT DETECTED Final   Metapneumovirus NOT DETECTED NOT DETECTED Final   Rhinovirus / Enterovirus NOT DETECTED NOT DETECTED Final   Influenza A NOT DETECTED NOT DETECTED Final   Influenza B NOT DETECTED NOT DETECTED Final   Parainfluenza Virus 1 NOT DETECTED NOT  DETECTED Final   Parainfluenza Virus 2 NOT DETECTED NOT DETECTED Final   Parainfluenza Virus 3 NOT DETECTED NOT DETECTED Final   Parainfluenza Virus 4 NOT DETECTED NOT DETECTED Final   Respiratory Syncytial Virus NOT DETECTED NOT DETECTED Final   Bordetella pertussis NOT DETECTED NOT DETECTED Final   Chlamydophila pneumoniae NOT DETECTED NOT DETECTED Final   Mycoplasma pneumoniae NOT DETECTED NOT DETECTED Final    Comment: Performed at [facility], [place] [address] [place], [place] [address]         Radiology Studies: No results found.      Scheduled Meds: . atorvastatin  10 mg Oral QHS  . budesonide (PULMICORT) nebulizer solution  0.25 mg Nebulization BID  . Chlorhexidine Gluconate Cloth  6 each Topical Daily  . enoxaparin (LOVENOX) injection  40 mg Subcutaneous Q24H  . escitalopram  10 mg Oral Daily  .  folic acid  1 mg Oral Daily  . furosemide  20 mg Oral Daily  . ipratropium-albuterol  3 mL Nebulization TID  . lisinopril  10 mg Oral Daily  . methylPREDNISolone (SOLU-MEDROL) injection  60 mg Intravenous Q12H  . potassium chloride  40 mEq Oral Daily   Continuous Infusions: . sodium chloride Stopped (09/01/18 1852)  . azithromycin 500 mg (09/02/18 1827)     LOS: 2 days    Time spent: 30 minutes     Darleen Crocker, DO [facility] Pager [phone]  If 7PM-7AM, please contact night-coverage [email or web address] Password [facility] 09/03/2018, 8:38 AM

## 2018-09-03 NOTE — Progress Notes (Signed)
Subjective: He says he feels better.  He was given Lasix yesterday and had good diuresis.  No new complaints.  He has some cough which is nonproductive  Objective: Vital signs in last 24 hours: Temp:  [97.5 F (36.4 C)-98.3 F (36.8 C)] 97.6 F (36.4 C) (05/11 0733) Pulse Rate:  [71-119] 92 (05/11 0500) Resp:  [25-37] 26 (05/10 1500) BP: (94-149)/(65-91) 119/67 (05/11 0500) SpO2:  [88 %-100 %] 100 % (05/11 0733) Weight:  [68.9 kg] 68.9 kg (05/11 0500) Weight change: 0 kg Last BM Date: 09/01/18  Intake/Output from previous day: 05/10 0701 - 05/11 0700 In: 360 [P.O.:360] Out: 1950 [Urine:1950]  PHYSICAL EXAM General appearance: alert, cooperative and no distress Resp: rales Diffusely bilaterally Cardio: regular rate and rhythm, S1, S2 normal, no murmur, click, rub or gallop GI: soft, non-tender; bowel sounds normal; no masses,  no organomegaly Extremities: extremities normal, atraumatic, no cyanosis or edema  Lab Results:  Results for orders placed or performed during the hospital encounter of 08/30/18 (from the past 48 hour(s))  Basic metabolic panel     Status: Abnormal   Collection Time: 09/02/18  4:33 AM  Result Value Ref Range   Sodium 140 135 - 145 mmol/L   Potassium 4.5 3.5 - 5.1 mmol/L    Comment: DELTA CHECK NOTED   Chloride 103 98 - 111 mmol/L   CO2 29 22 - 32 mmol/L   Glucose, Bld 156 (H) 70 - 99 mg/dL   BUN 21 8 - 23 mg/dL   Creatinine, Ser 0.85 0.61 - 1.24 mg/dL   Calcium 8.3 (L) 8.9 - 10.3 mg/dL   GFR calc non Af Amer >60 >60 mL/min   GFR calc Af Amer >60 >60 mL/min   Anion gap 8 5 - 15    Comment: Performed at [facility], [address], [place], [place] [address]  Magnesium     Status: None   Collection Time: 09/02/18  4:33 AM  Result Value Ref Range   Magnesium 2.0 1.7 - 2.4 mg/dL    Comment: Performed at [facility], [address] [place], [place] [address]  Basic metabolic panel     Status: Abnormal   Collection Time: 09/03/18  5:14 AM   Result Value Ref Range   Sodium 136 135 - 145 mmol/L   Potassium 4.5 3.5 - 5.1 mmol/L   Chloride 100 98 - 111 mmol/L   CO2 26 22 - 32 mmol/L   Glucose, Bld 135 (H) 70 - 99 mg/dL   BUN 26 (H) 8 - 23 mg/dL   Creatinine, Ser 0.98 0.61 - 1.24 mg/dL   Calcium 8.0 (L) 8.9 - 10.3 mg/dL   GFR calc non Af Amer >60 >60 mL/min   GFR calc Af Amer >60 >60 mL/min   Anion gap 10 5 - 15    Comment: Performed at [facility], [address] [place], [place] [address]  Magnesium     Status: None   Collection Time: 09/03/18  5:14 AM  Result Value Ref Range   Magnesium 2.0 1.7 - 2.4 mg/dL    Comment: Performed at [facility], [address] [place], [place] [address]  CBC     Status: Abnormal   Collection Time: 09/03/18  5:14 AM  Result Value Ref Range   WBC 13.6 (H) 4.0 - 10.5 K/uL   RBC 3.81 (L) 4.22 - 5.81 MIL/uL   Hemoglobin 10.4 (L) 13.0 - 17.0 g/dL   HCT 34.2 (L) 39.0 - 52.0 %  MCV 89.8 80.0 - 100.0 fL   MCH 27.3 26.0 - 34.0 pg   MCHC 30.4 30.0 - 36.0 g/dL   RDW 17.6 (H) 11.5 - 15.5 %   Platelets 319 150 - 400 K/uL   nRBC 0.1 0.0 - 0.2 %    Comment: Performed at [facility], [address] [place], [place] [address]    ABGS No results for input(s): PHART, PO2ART, TCO2, HCO3 in the last 72 hours.  Invalid input(s): PCO2 CULTURES Recent Results (from the past 240 hour(s))  SARS Coronavirus 2 (CEPHEID- Performed in [place] hospital lab), Hosp Order     Status: None   Collection Time: 08/30/18 11:36 AM  Result Value Ref Range Status   SARS Coronavirus 2 NEGATIVE NEGATIVE Final    Comment: (NOTE) If result is NEGATIVE SARS-CoV-2 target nucleic acids are NOT DETECTED. The SARS-CoV-2 RNA is generally detectable in upper and lower  respiratory specimens during the acute phase of infection. The lowest  concentration of SARS-CoV-2 viral copies this assay can detect is 250  copies / mL. A negative result does not preclude SARS-CoV-2 infection  and should not be used as the sole  basis for treatment or other  patient management decisions.  A negative result may occur with  improper specimen collection / handling, submission of specimen other  than nasopharyngeal swab, presence of viral mutation(s) within the  areas targeted by this assay, and inadequate number of viral copies  (<250 copies / mL). A negative result must be combined with clinical  observations, patient history, and epidemiological information. If result is POSITIVE SARS-CoV-2 target nucleic acids are DETECTED. The SARS-CoV-2 RNA is generally detectable in upper and lower  respiratory specimens dur ing the acute phase of infection.  Positive  results are indicative of active infection with SARS-CoV-2.  Clinical  correlation with patient history and other diagnostic information is  necessary to determine patient infection status.  Positive results do  not rule out bacterial infection or co-infection with other viruses. If result is PRESUMPTIVE POSTIVE SARS-CoV-2 nucleic acids MAY BE PRESENT.   A presumptive positive result was obtained on the submitted specimen  and confirmed on repeat testing.  While 2019 novel coronavirus  (SARS-CoV-2) nucleic acids may be present in the submitted sample  additional confirmatory testing may be necessary for epidemiological  and / or clinical management purposes  to differentiate between  SARS-CoV-2 and other Sarbecovirus currently known to infect humans.  If clinically indicated additional testing with an alternate test  methodology [phone]) is advised. The SARS-CoV-2 RNA is generally  detectable in upper and lower respiratory sp ecimens during the acute  phase of infection. The expected result is Negative. Fact Sheet for Patients:  StrictlyIdeas.no Fact Sheet for Healthcare Providers: BankingDealers.co.za This test is not yet approved or cleared by the [place] FDA and has been authorized for detection  and/or diagnosis of SARS-CoV-2 by FDA under an Emergency Use Authorization (EUA).  This EUA will remain in effect (meaning this test can be used) for the duration of the COVID-19 declaration under Section 564(b)(1) of the Act, 21 U.S.C. section 360bbb-3(b)(1), unless the authorization is terminated or revoked sooner. Performed at [facility], [address], [place], [place] [address]   Blood culture (routine x 2)     Status: None (Preliminary result)   Collection Time: 08/30/18 12:32 PM  Result Value Ref Range Status   Specimen Description LEFT ANTECUBITAL  Final   Special Requests   Final  BOTTLES DRAWN AEROBIC AND ANAEROBIC Blood Culture adequate volume   Culture   Final    NO GROWTH 4 DAYS Performed at [facility], [address] [place], [place] [address]    Report Status PENDING  Incomplete  Blood culture (routine x 2)     Status: None (Preliminary result)   Collection Time: 08/30/18 12:32 PM  Result Value Ref Range Status   Specimen Description BLOOD RIGHT FOREARM  Final   Special Requests   Final    BOTTLES DRAWN AEROBIC AND ANAEROBIC Blood Culture adequate volume   Culture   Final    NO GROWTH 4 DAYS Performed at [facility], [address], [place], [place] [address]    Report Status PENDING  Incomplete  MRSA PCR Screening     Status: None   Collection Time: 08/30/18  5:33 PM  Result Value Ref Range Status   MRSA by PCR NEGATIVE NEGATIVE Final    Comment:        The GeneXpert MRSA Assay (FDA approved for NASAL specimens only), is one component of a comprehensive MRSA colonization surveillance program. It is not intended to diagnose MRSA infection nor to guide or monitor treatment for MRSA infections. Performed at [facility], [address] [place], [place] [address]   Respiratory Panel by PCR     Status: None   Collection Time: 08/31/18  7:32 AM  Result Value Ref Range Status   Adenovirus NOT DETECTED NOT DETECTED Final   Coronavirus 229E NOT DETECTED  NOT DETECTED Final    Comment: (NOTE) The Coronavirus on the Respiratory Panel, DOES NOT test for the novel  Coronavirus (2019 nCoV)    Coronavirus HKU1 NOT DETECTED NOT DETECTED Final   Coronavirus NL63 NOT DETECTED NOT DETECTED Final   Coronavirus OC43 NOT DETECTED NOT DETECTED Final   Metapneumovirus NOT DETECTED NOT DETECTED Final   Rhinovirus / Enterovirus NOT DETECTED NOT DETECTED Final   Influenza A NOT DETECTED NOT DETECTED Final   Influenza B NOT DETECTED NOT DETECTED Final   Parainfluenza Virus 1 NOT DETECTED NOT DETECTED Final   Parainfluenza Virus 2 NOT DETECTED NOT DETECTED Final   Parainfluenza Virus 3 NOT DETECTED NOT DETECTED Final   Parainfluenza Virus 4 NOT DETECTED NOT DETECTED Final   Respiratory Syncytial Virus NOT DETECTED NOT DETECTED Final   Bordetella pertussis NOT DETECTED NOT DETECTED Final   Chlamydophila pneumoniae NOT DETECTED NOT DETECTED Final   Mycoplasma pneumoniae NOT DETECTED NOT DETECTED Final    Comment: Performed at [facility], [place] [address] [place], [place] [address]   Studies/Results: No results found.  Medications:  Prior to Admission:  Medications Prior to Admission  Medication Sig Dispense Refill Last Dose  . albuterol (VENTOLIN HFA) 108 (90 Base) MCG/ACT inhaler Inhale 2 puffs into the lungs every 6 (six) hours as needed for wheezing or shortness of breath. 1 Inhaler 0 Taking  . atorvastatin (LIPITOR) 10 MG tablet Take 1 tablet (10 mg total) by mouth at bedtime. 90 tablet 1 Past Week at Unknown time  . B-D TB SYRINGE 1CC/26GX3/8" 26G X 3/8" 1 ML MISC USE TO INJECT methotrexate ONCE every WEEK AS DIRECTED   08/29/2018 at Unknown time  . escitalopram (LEXAPRO) 20 MG tablet Take 0.5 tablets (10 mg total) by mouth daily. 30 tablet 5 08/29/2018 at Unknown time  . folic acid (FOLVITE) 1 MG tablet Take 1 tablet by mouth daily.   08/29/2018 at Unknown time  . leuprolide (LUPRON) 30 MG injection  Inject 30 mg into the muscle every 3 (three)  months.   Taking  . Multiple Vitamin (MULTIVITAMIN WITH MINERALS) TABS tablet Take 1 tablet by mouth daily. 30 tablet 0 08/29/2018 at Unknown time  . OXYGEN 4 lpm 24/7  unsure of DME   08/30/2018 at Unknown time  . predniSONE (DELTASONE) 1 MG tablet Take 5 tablets (5 mg total) by mouth daily with breakfast. 150 tablet 1 Past Week at Unknown time  . predniSONE (DELTASONE) 10 MG tablet Take  4 each am x 2 days,   2 each am x 2 days,  1 each am x 2 days and stop 14 tablet 0 08/29/2018 at Unknown time  . amLODipine (NORVASC) 5 MG tablet Take 5 mg by mouth daily.   Not Taking at Unknown time  . B Complex Vitamins (VITAMIN B-COMPLEX PO) Take 1 tablet by mouth daily.    Not Taking at Unknown time  . Cholecalciferol (VITAMIN D3) 5000 UNITS TABS Take 1-2 tablets by mouth daily.    Not Taking at Unknown time  . furosemide (LASIX) 20 MG tablet Take 20 mg by mouth daily.   Not Taking at Unknown time  . guaiFENesin (MUCINEX) 600 MG 12 hr tablet Take 2 tablets (1,200 mg total) by mouth 2 (two) times daily as needed. (Patient not taking: Reported on 08/30/2018) 14 tablet 0 Not Taking at Unknown time  . lisinopril (ZESTRIL) 10 MG tablet Take 10 mg by mouth daily.   Not Taking at Unknown time  . OLIVE LEAF PO Take 1 tablet by mouth daily.    Not Taking at Unknown time   Scheduled: . atorvastatin  10 mg Oral QHS  . budesonide (PULMICORT) nebulizer solution  0.25 mg Nebulization BID  . Chlorhexidine Gluconate Cloth  6 each Topical Daily  . enoxaparin (LOVENOX) injection  40 mg Subcutaneous Q24H  . escitalopram  10 mg Oral Daily  . folic acid  1 mg Oral Daily  . ipratropium-albuterol  3 mL Nebulization TID  . lisinopril  10 mg Oral Daily  . methylPREDNISolone (SOLU-MEDROL) injection  60 mg Intravenous Q12H  . potassium chloride  40 mEq Oral Daily   Continuous: . sodium chloride Stopped (09/01/18 1852)  . azithromycin 500 mg (09/02/18 1827)   TTS:VXBLTJ chloride, acetaminophen **OR** acetaminophen, albuterol,  guaiFENesin-dextromethorphan, HYDROcodone-acetaminophen, ondansetron **OR** ondansetron (ZOFRAN) IV, polyethylene glycol  Assesment: He was admitted with acute on chronic hypoxic respiratory failure.  He has some element of COPD but he largely has pulmonary fibrosis.  He also probably had some element of volume overload and he has responded to diuresis. Active Problems:   Acute on chronic respiratory failure (HCC)   COPD (chronic obstructive pulmonary disease) (HCC)    Plan: Continue steroids.  Continue other treatments.    LOS: 2 days   Joel Valdez 09/03/2018, 8:00 AM

## 2018-09-03 NOTE — Care Management Important Message (Signed)
Important Message  Patient Details  Name: Joel Valdez. [place] MRN: [id number] Date of Birth: Jul 17, 1945   Medicare Important Message Given:  Yes    Tommy Medal 09/03/2018, 3:04 PM

## 2018-09-03 NOTE — Plan of Care (Signed)
  Problem: Acute Rehab PT Goals(only PT should resolve) Goal: Pt Will Go Supine/Side To Sit Outcome: Progressing Flowsheets (Taken 09/03/2018 1401) Pt will go Supine/Side to Sit: Independently Goal: Patient Will Transfer Sit To/From Stand Outcome: Progressing Flowsheets (Taken 09/03/2018 1401) Patient will transfer sit to/from stand: with modified independence Goal: Pt Will Transfer Bed To Chair/Chair To Bed Outcome: Progressing Flowsheets (Taken 09/03/2018 1401) Pt will Transfer Bed to Chair/Chair to Bed: with modified independence Goal: Pt Will Ambulate Outcome: Progressing Flowsheets (Taken 09/03/2018 1401) Pt will Ambulate: 100 feet; with modified independence; with cane   2:01 PM, 09/03/18 Lonell Grandchild, MPT Physical Therapist with [facility] [phone] office [phone] mobile phone

## 2018-09-04 LAB — CULTURE, BLOOD (ROUTINE X 2)
Culture: NO GROWTH
Culture: NO GROWTH
Special Requests: ADEQUATE
Special Requests: ADEQUATE

## 2018-09-04 LAB — BASIC METABOLIC PANEL
Anion gap: 7 (ref 5–15)
BUN: 24 mg/dL — ABNORMAL HIGH (ref 8–23)
CO2: 29 mmol/L (ref 22–32)
Calcium: 8 mg/dL — ABNORMAL LOW (ref 8.9–10.3)
Chloride: 102 mmol/L (ref 98–111)
Creatinine, Ser: 1.03 mg/dL (ref 0.61–1.24)
GFR calc Af Amer: >60 mL/min (ref >60)
GFR calc non Af Amer: >60 mL/min (ref >60)
Glucose, Bld: 131 mg/dL — ABNORMAL HIGH (ref 70–99)
Potassium: 4.5 mmol/L (ref 3.5–5.1)
Sodium: 138 mmol/L (ref 135–145)

## 2018-09-04 MED ORDER — METHYLPREDNISOLONE SODIUM SUCC 40 MG IJ SOLR
40.0000 mg | Freq: Two times a day (BID) | INTRAMUSCULAR | Status: DC
  Administered 2018-09-04 – 2018-09-06 (×4): 40 mg via INTRAVENOUS
  Filled 2018-09-04 (×4): qty 1

## 2018-09-04 MED ORDER — PREDNISONE 20 MG PO TABS: 40.0000 mg | ORAL_TABLET | Freq: Every day | ORAL | 0 refills | Status: DC

## 2018-09-04 MED ORDER — IPRATROPIUM-ALBUTEROL 0.5-2.5 (3) MG/3ML IN SOLN: 3.0000 mL | Freq: Four times a day (QID) | RESPIRATORY_TRACT | 3 refills | Status: AC | PRN

## 2018-09-04 NOTE — Progress Notes (Signed)
Physical Therapy Treatment Patient Details Name: Joel Valdez MRN: [id number] DOB: 06/21/1945 Today's Date: 09/04/2018    History of Present Illness Joel Valdez is a 73 y.o. male with medical history significant for interstitial lung disease chronic respiratory failure, hypertension, rheumatoid arthritis, immunosuppression, who presented to the ED with complaints of difficulty breathing started about 3 hours prior to arrival in the ED.  Reports worsening of his chronic cough also, but his cough over the past 3 weeks has been nonproductive.  Reports worsening lower extremity swelling R>L, without pain redness or warmth.  No fever or chills.    PT Comments    Pt friendly and willing to participate.  Independent bed mobility and min guard with transfer.  Pt limited by c/o chest pain (RN aware), SOB and decreased SpO2 with exertion.  Pt stated he has not been on O2 at home (though previous notes indicate he was sent home with O2 A following last visit to hosptial.)  Assessed O2 saturation without assistance from nasal cannula.  Pt able to keep O2 saturation WNL for 2 minutes then decreased to 85% on room air while sitting on EOB.  Added 2L of O2 A with ability to keep O2 saturation at 94% for 75 ft then decreased to 84% and required increased to 3L then 4L to bring SpO2 above 93% prior return to room.  Pt left in chair with O2 saturation at 95% on 3L O2A.  No reports of increased pain through session.  Was limited by fatigue and SOB.  RN aware of status and O2 saturation levels.    Follow Up Recommendations  Home health PT;Outpatient PT     Equipment Recommendations  None recommended by PT    Recommendations for Other Services       Precautions / Restrictions Precautions Precautions: Fall Precaution Comments: monitor sats  Restrictions Weight Bearing Restrictions: No    Mobility  Bed Mobility Overal bed mobility: Independent                Transfers Overall transfer level:  Independent                  Ambulation/Gait Ambulation/Gait assistance: Supervision Gait Distance (Feet): 120 Feet Assistive device: None Gait Pattern/deviations: Decreased step length - right;Decreased step length - left;Decreased stride length Gait velocity: decreased   General Gait Details: slightly labored cadence with frequent leaning on counter tops for support, no loss of balance, on 3 LPM with SpO2 below 90%   Stairs             Wheelchair Mobility    Modified Rankin (Stroke Patients Only)       Balance                                            Cognition                                              Exercises      General Comments        Pertinent Vitals/Pain Pain Assessment: 0-10 Pain Score: 6  Pain Location: left side upper chest Pain Descriptors / Indicators: Sore Pain Intervention(s): Monitored during session;Limited activity within patient's tolerance    Home Living  Prior Function            PT Goals (current goals can now be found in the care plan section)      Frequency    Min 3X/week      PT Plan Current plan remains appropriate    Co-evaluation              AM-PAC PT "6 Clicks" Mobility   Outcome Measure  Help needed turning from your back to your side while in a flat bed without using bedrails?: None Help needed moving from lying on your back to sitting on the side of a flat bed without using bedrails?: None Help needed moving to and from a bed to a chair (including a wheelchair)?: None Help needed standing up from a chair using your arms (e.g., wheelchair or bedside chair)?: None Help needed to walk in hospital room?: A Little Help needed climbing 3-5 steps with a railing? : A Little 6 Click Score: 22    End of Session Equipment Utilized During Treatment: Oxygen Activity Tolerance: Patient tolerated treatment well;Patient limited by  fatigue(Limited by SOB and decreased SpO2) Patient left: in chair;with call bell/phone within reach;with nursing/sitter in room Nurse Communication: Mobility status PT Visit Diagnosis: Unsteadiness on feet (R26.81);Other abnormalities of gait and mobility (R26.89);Muscle weakness (generalized) (M62.81)     Time: 1093-2355 PT Time Calculation (min) (ACUTE ONLY): 21 min  Charges:  $Therapeutic Activity: 8-22 mins                     [address], LPTA; CBIS [phone]   Aldona Lento 09/04/2018, 11:49 AM

## 2018-09-04 NOTE — Progress Notes (Signed)
Subjective: He says he feels much better.  He is requiring less oxygen.  He is less short of breath.  He has less cough.  Exam: He looks much more comfortable.  He still has bilateral rales diffusely  Assessment: He has acute on chronic hypoxic respiratory failure from a combination of exacerbation of pulmonary fibrosis, exacerbation of COPD and probably some volume overload.  He is much improved.  Plan: Agree he is approaching discharge.  I would plan to have him follow-up in my office.  I will plan to sign off at this point.  Thanks for allowing me to see him with you.  Discussed plan with Dr. Manuella Ghazi

## 2018-09-04 NOTE — Discharge Summary (Signed)
Physician Discharge Summary  Joel Valdez. Bolyard ZDG:[id number] DOB: [birth year] DOA: 08/30/2018  PCP: [name], PA-C  Admit date: 08/30/2018  Discharge date: 09/04/2018  Admitted From:Home    Disposition:  Home  Recommendations for Outpatient Follow-up:  1. Follow up with PCP in 1-2 weeks 2. Remain on prednisone taper as prescribed 3. Follow-up with Dr. Luan Pulling of pulmonology in 1 to 2 weeks 4. Patient will be given home nebulizer machine as well as duo nebs to use as needed for shortness of breath or wheezing  Home Health: Yes with PT to continue upon discharge  Equipment/Devices: Patient has home oxygen and will require 3 L nasal cannula  Discharge Condition: Stable  CODE STATUS: Full  Diet recommendation: Heart Healthy  Brief/Interim Summary: Per HPI: Joel L. Milleris a 73 y.o.malewith medical history significant forinterstitial lung diseasechronic respiratory failure, hypertension, rheumatoid arthritis,immunosuppression,who presented to the ED with complaints of difficulty breathing started about 3 hours prior to arrival in the ED. Reports worsening of his chronic cough also, buthis cough over the past 3 weeks has been nonproductive. Reports worsening lower extremity swelling R>L,without pain redness or warmth. No fever or chills. Also reports onset of left-sided chest pain that started this morningas hewoke up. Pain is radiating, worse with movement. Patient reports pain at this time improved after he was given some medications in the ED (aspirin). Reports chest pain is similar to chest pain he had recently requiring hospitalization. During her tele-visit with his primary care provider today, patient was winded on interview, withpatient's O2 at that time on 4.5 L was 96%,but later dropped to 77%. EMS gave 1 rate 25 mg of Solu-Medrol prior to arrival in the ED  Patient was admitted with acute on chronic hypoxemic respiratory failure likely secondary to acute  bronchitis/atypical infection with possible associated flare of his advanced interstitial fibrosis. He is also noted to have bilateral pleural effusions and has been started on IV Solu-Medrol as well as DuoNebs and azithromycin along with Lasix. He was notably recently hospitalized for multifocal pneumonia in early April and was intubated at that time and had a complication of small pneumothorax which was treated with chest tube. He was also noted to have chest pain later in April with unremarkable echocardiogram and was discharged home on 4 to 6 L of oxygen.  Patient has no evidence of PE on CTA chest and right lower extremity Doppler negative for any acute findings. He has diuresed well overnight on 5/8 and is noted to have some hypokalemia on the morning of 5/9.  He has finished a course of azithromycin over the last 5 days and this has now been discontinued.  He currently remains on 3 L nasal cannula which is reportedly what he was discharged on at the end of April.  He is still on high-dose IV steroids on 5/12 which will be weaned further today and he will be ambulated.  He is ready for discharge on 5/13 and will follow up with Dr. Luan Pulling of pulmonology in the outpatient setting.  He will be provided a home nebulizer machine as well as breathing treatments to use as needed for shortness of breath or wheezing.  Additionally he has been evaluated by physical therapy and deemed appropriate for home physical therapy at this time and does not require any rehab.  Discharge Diagnoses:  Active Problems:   Acute on chronic respiratory failure (HCC)   COPD (chronic obstructive pulmonary disease) ([place])  Principal discharge diagnosis: Acute on chronic hypoxemic respiratory failure-multifactorial  secondary to interstitial fibrosis flare as well as volume overload.  Discharge Instructions  Discharge Instructions    DME Nebulizer machine   Complete by:  As directed    Patient needs a nebulizer to treat  with the following condition:  Interstitial lung disease ([place])     Allergies as of 09/04/2018      Reactions   Methotrexate Derivatives Diarrhea, Other (See Comments)   Weakness; Severe bone pain; No Appetite Methotrexate with preservatives      Medication List    TAKE these medications   albuterol 108 (90 Base) MCG/ACT inhaler Commonly known as:  VENTOLIN HFA Inhale 2 puffs into the lungs every 6 (six) hours as needed for wheezing or shortness of breath.   amLODipine 5 MG tablet Commonly known as:  NORVASC Take 5 mg by mouth daily.   atorvastatin 10 MG tablet Commonly known as:  LIPITOR Take 1 tablet (10 mg total) by mouth at bedtime.   B-D TB SYRINGE 1CC/26GX3/8" 26G X 3/8" 1 ML Misc Generic drug:  TUBERCULIN SYR 1CC/26GX3/8" USE TO INJECT methotrexate ONCE every WEEK AS DIRECTED   escitalopram 20 MG tablet Commonly known as:  LEXAPRO Take 0.5 tablets (10 mg total) by mouth daily.   folic acid 1 MG tablet Commonly known as:  FOLVITE Take 1 tablet by mouth daily.   furosemide 20 MG tablet Commonly known as:  LASIX Take 20 mg by mouth daily.   guaiFENesin 600 MG 12 hr tablet Commonly known as:  MUCINEX Take 2 tablets (1,200 mg total) by mouth 2 (two) times daily as needed.   ipratropium-albuterol 0.5-2.5 (3) MG/3ML Soln Commonly known as:  DUONEB Take 3 mLs by nebulization every 6 (six) hours as needed for up to 30 days (shortness of breath or wheezing).   leuprolide 30 MG injection Commonly known as:  LUPRON Inject 30 mg into the muscle every 3 (three) months.   lisinopril 10 MG tablet Commonly known as:  ZESTRIL Take 10 mg by mouth daily.   multivitamin with minerals Tabs tablet Take 1 tablet by mouth daily.   OLIVE LEAF PO Take 1 tablet by mouth daily.   OXYGEN 4 lpm 24/7  unsure of DME   predniSONE 20 MG tablet Commonly known as:  Deltasone Take 2 tablets (40 mg total) by mouth daily for 5 days. What changed:    medication strength  how much  to take  when to take this  Another medication with the same name was removed. Continue taking this medication, and follow the directions you see here.   VITAMIN B-COMPLEX PO Take 1 tablet by mouth daily.   Vitamin D3 125 MCG (5000 UT) Tabs Take 1-2 tablets by mouth daily.            Durable Medical Equipment  (From admission, onward)         Start     Ordered   09/04/18 0000  DME Nebulizer machine    Question:  Patient needs a nebulizer to treat with the following condition  Answer:  Interstitial lung disease ([place])   09/04/18 1225         Follow-up Information    [name], PA-C Follow up on 09/07/2018.   Specialty:  Family Medicine Why:  Friday at 11:00 with Elyn Aquas.  This is an in person appointment at the office. Contact information: [address] [place] [address] [phone]        Sinda Du, MD Follow up  in 1 week(s).   Specialty:  Pulmonary Disease Contact information: [address] [place] [address] [phone]          Allergies  Allergen Reactions  . Methotrexate Derivatives Diarrhea and Other (See Comments)    Weakness; Severe bone pain; No Appetite Methotrexate with preservatives    Consultations:  Pulmonology   Procedures/Studies: Dg Chest 2 View  Result Date: 08/29/2018 CLINICAL DATA:  Dyspnea on exertion. EXAM: CHEST - 2 VIEW COMPARISON:  08/21/2018 and CT 08/22/2018 FINDINGS: Lungs are hypoinflated demonstrate bilateral coarse interstitial disease compatible with known fibrosis. No new airspace process or effusion. Cardiomediastinal silhouette and remainder of the exam is unchanged. IMPRESSION: No acute findings. Stable bilateral interstitial disease. Electronically Signed   By: Marin Olp M.D.   On: 08/29/2018 15:30   Dg Chest 2 View  Result Date: 08/21/2018 CLINICAL DATA:  Chest pain. EXAM: CHEST - 2 VIEW COMPARISON:  CT scan of August 09, 2018. Radiograph of August 06, 2018. FINDINGS:  Stable cardiomediastinal silhouette. Atherosclerosis of thoracic aorta is noted. No pneumothorax or pleural effusion is noted. Stable coarse reticular interstitial densities are noted throughout both lungs most consistent with scarring or fibrosis. No acute abnormality is noted. Bony thorax is unremarkable. IMPRESSION: Stable coarse reticular densities are noted throughout both lungs most consistent with scarring or fibrosis. No acute abnormality is noted. Aortic Atherosclerosis (ICD10-I70.0). Electronically Signed   By: Marijo Conception M.D.   On: 08/21/2018 15:12   Ct Angio Chest Pe W And/or Wo Contrast  Result Date: 08/30/2018 CLINICAL DATA:  73 year old male with shortness of breath EXAM: CT ANGIOGRAPHY CHEST WITH CONTRAST TECHNIQUE: Multidetector CT imaging of the chest was performed using the standard protocol during bolus administration of intravenous contrast. Multiplanar CT image reconstructions and MIPs were obtained to evaluate the vascular anatomy. CONTRAST:  121mL OMNIPAQUE IOHEXOL 350 MG/ML SOLN COMPARISON:  CT 08/22/2018, 08/09/2018 FINDINGS: Cardiovascular: Heart: Similar appearance of the heart size with cardiomegaly. No pericardial fluid/thickening . Calcifications of left main, left anterior descending, right coronary arteries. Aorta: Unremarkable course, caliber, contour of the thoracic aorta. No aneurysm or dissection flap. No periaortic fluid. Calcifications of the aortic arch. Pulmonary arteries: No central, lobar, segmental, or proximal subsegmental filling defects. Main pulmonary diameter measures 3.0 cm Mediastinum/Nodes: No mediastinal adenopathy. Unremarkable appearance of the thoracic esophagus. Unremarkable thoracic inlet Lungs/Pleura: Redemonstration of background of centrilobular and paraseptal emphysema with architectural distortion and a pattern of subpleural reticulation/honeycomb pattern of the bilateral lungs, predominance of the lower lobes. Interlobular septal thickening. New  trace left pleural fluid compared to the prior CT. Slight increased right pleural fluid at the fissure compared to the prior CT. No endotracheal or endobronchial debris. Bronchial wall thickening is similar to prior. Overall, there is slight increased interstitial opacities. Upper Abdomen: No acute. Musculoskeletal: No acute displaced fracture. Degenerative changes of the spine. Review of the MIP images confirms the above findings. IMPRESSION: Redemonstration of advanced interstitial fibrosis, with likely superimposed acute bronchitis/atypical infection given that there is slight increased prominence of the interstitial opacities, enlarging right pleural effusion, and new left pleural effusion. No evidence of overt edema. Aortic atherosclerosis and coronary artery disease. Aortic Atherosclerosis (ICD10-I70.0). Electronically Signed   By: Corrie Mckusick D.O.   On: 08/30/2018 15:31   Ct Angio Chest Pe W And/or Wo Contrast  Result Date: 08/22/2018 CLINICAL DATA:  Chest pain and shortness of breath. Elevated D-dimer. High clinical probability for pulmonary embolism. EXAM: CT ANGIOGRAPHY CHEST WITH CONTRAST TECHNIQUE: Multidetector CT imaging  of the chest was performed using the standard protocol during bolus administration of intravenous contrast. Multiplanar CT image reconstructions and MIPs were obtained to evaluate the vascular anatomy. CONTRAST:  120mL OMNIPAQUE IOHEXOL 350 MG/ML SOLN COMPARISON:  08/09/2018 from [place]: Cardiovascular: Satisfactory opacification of pulmonary arteries noted, and no pulmonary emboli identified. No evidence of thoracic aortic dissection or aneurysm. Mild cardiomegaly. Aortic and coronary artery atherosclerosis. Mediastinum/Nodes: No masses or pathologically enlarged lymph nodes identified. Lungs/Pleura: Severe chronic pulmonary interstitial fibrosis again seen with honeycombing and traction bronchiectasis which is most severe in the left upper lobe.  Previously seen areas of parenchymal consolidation in the right mid and lower lung are decreased since previous study, consistent with resolving superimposed infectious or inflammatory process. No new or worsening areas of pulmonary opacity are identified. No evidence of pneumothorax or pleural effusion. Upper abdomen: No acute findings. Musculoskeletal: No suspicious bone lesions identified. Review of the MIP images confirms the above findings. IMPRESSION: 1. No evidence of pulmonary embolism or other acute findings. 2. Severe chronic pulmonary interstitial fibrosis. 3. Resolving right mid and lower lung airspace opacity, consistent with resolving superimposed infectious or inflammatory process. 4. Aortic and coronary artery atherosclerosis. Electronically Signed   By: Earle Gell M.D.   On: 08/22/2018 20:20   Ct Angio Chest Pe W Or Wo Contrast  Result Date: 08/09/2018 CLINICAL DATA:  Persistent hypoxia, negative COVID EXAM: CT ANGIOGRAPHY CHEST WITH CONTRAST TECHNIQUE: Multidetector CT imaging of the chest was performed using the standard protocol during bolus administration of intravenous contrast. Multiplanar CT image reconstructions and MIPs were obtained to evaluate the vascular anatomy. CONTRAST:  144mL OMNIPAQUE IOHEXOL 350 MG/ML SOLN COMPARISON:  Chest radiographs, 08/06/2018 FINDINGS: Cardiovascular: Satisfactory opacification of the pulmonary arteries to the segmental level. No evidence of pulmonary embolism. Normal heart size. Scattered coronary artery calcifications. Aortic atherosclerosis. No pericardial effusion. Mediastinum/Nodes: No enlarged mediastinal, hilar, or axillary lymph nodes. Thyroid gland, trachea, and esophagus demonstrate no significant findings. Lungs/Pleura: Moderate to severe pulmonary fibrosis in a pattern featuring mild traction bronchiectasis and multiple areas of bronchiolectasis and possible honeycombing, for example in left upper lobe. There is no significant apical to  basal gradient. Moderate underlying emphysema. There are areas of ground-glass and consolidative airspace opacity in the right greater than left lung bases. There are areas of dense consolidation about the fissures (e.g. series 6, image 72). No pleural effusion or pneumothorax. Upper Abdomen: No acute abnormality. Musculoskeletal: No chest wall abnormality. No acute or significant osseous findings. Review of the MIP images confirms the above findings. IMPRESSION: 1.  Negative examination for pulmonary embolism. 2. Moderate to severe pulmonary fibrosis in a pattern featuring mild traction bronchiectasis and multiple areas of bronchiolectasis and possible honeycombing, for example in left upper lobe. There is no significant apical to basal gradient. Moderate underlying emphysema. 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 There are areas of dense consolidation about the fissures (e.g. series 6, image 72), which are likely related to chronic scarring. Recommend follow-up CT for these areas in 3 months to exclude underlying mass. 3. There are areas of ground-glass and consolidative airspace opacity in the right greater than left lung bases, consistent with multifocal infection. 4.  Coronary artery disease and aortic atherosclerosis. Electronically Signed   By: Eddie Candle M.D.   On: 08/09/2018 10:56   US Venous Img Lower Unilateral Right  Result Date: 08/31/2018 CLINICAL DATA:  Right lower extremity edema. History of prostate cancer.  Evaluate for DVT. EXAM: RIGHT LOWER EXTREMITY VENOUS DOPPLER ULTRASOUND TECHNIQUE: Gray-scale sonography with graded compression, as well as color Doppler and duplex ultrasound were performed to evaluate the lower extremity deep venous systems from the level of the common femoral vein and including the common femoral, femoral, profunda femoral, popliteal and calf veins including the posterior tibial, peroneal and gastrocnemius veins  when visible. The superficial great saphenous vein was also interrogated. Spectral Doppler was utilized to evaluate flow at rest and with distal augmentation maneuvers in the common femoral, femoral and popliteal veins. COMPARISON:  None. FINDINGS: Contralateral Common Femoral Vein: Respiratory phasicity is normal and symmetric with the symptomatic side. No evidence of thrombus. Normal compressibility. Common Femoral Vein: No evidence of thrombus. Normal compressibility, respiratory phasicity and response to augmentation. Saphenofemoral Junction: No evidence of thrombus. Normal compressibility and flow on color Doppler imaging. Profunda Femoral Vein: No evidence of thrombus. Normal compressibility and flow on color Doppler imaging. Femoral Vein: No evidence of thrombus. Normal compressibility, respiratory phasicity and response to augmentation. Popliteal Vein: No evidence of thrombus. Normal compressibility, respiratory phasicity and response to augmentation. Calf Veins: No evidence of thrombus. Normal compressibility and flow on color Doppler imaging. Superficial Great Saphenous Vein: No evidence of thrombus. Normal compressibility. Venous Reflux:  None. Other Findings: A minimal subcutaneous edema is noted at the level right lower leg and calf. IMPRESSION: No evidence DVT within the right lower extremity. Electronically Signed   By: Sandi Mariscal M.D.   On: 08/31/2018 09:19   Dg Chest Portable 1 View  Result Date: 08/30/2018 CLINICAL DATA:  Shortness of breath. History of smoking and pulmonary fibrosis. EXAM: PORTABLE CHEST 1 VIEW COMPARISON:  08/29/2018 FINDINGS: The cardiomediastinal silhouette is unchanged. Aortic atherosclerosis is noted. Lung volumes remain mildly low with chronic coarse interstitial opacities again noted throughout both lungs. No definite acute airspace opacity is identified allowing for portable AP technique today compared to two view upright imaging yesterday. No sizable pleural effusion  or pneumothorax is identified. No acute osseous abnormality is seen. IMPRESSION: Chronic interstitial lung disease without evidence of acute abnormality. Electronically Signed   By: Logan Bores M.D.   On: 08/30/2018 12:24   Dg Chest Port 1 View  Result Date: 08/06/2018 CLINICAL DATA:  Evaluate pneumothorax.  COVID-19 pneumonia. EXAM: PORTABLE CHEST 1 VIEW COMPARISON:  08/04/2018 FINDINGS: Findings right internal jugular line tip at low SVC. Removal of left chest tube, without pneumothorax. Mild cardiomegaly. Atherosclerosis in the transverse aorta. No pleural fluid. Left greater than right and basilar predominant interstitial opacities are similar, given differences in technique. IMPRESSION: No significant change in left greater than right interstitial opacities, consistent with pneumonia. Removal of left chest tube, without pneumothorax or pleural fluid. Aortic Atherosclerosis (ICD10-I70.0). Electronically Signed   By: Abigail Miyamoto M.D.   On: 08/06/2018 08:51    Discharge Exam: Vitals:   09/04/18 0718 09/04/18 1145  BP:    Pulse:    Resp:    Temp:    SpO2: 97% 94%   Vitals:   09/03/18 2250 09/04/18 0552 09/04/18 0718 09/04/18 1145  BP: 112/78 118/72    Pulse: 79 71    Resp: 16 18    Temp: 97.7 F (36.5 C) 97.9 F (36.6 C)    TempSrc: Oral Oral    SpO2: 100% 98% 97% 94%  Weight:  69.6 kg    Height:        General: Pt is alert, awake, not in acute distress Cardiovascular: RRR, S1/S2 +, no  rubs, no gallops Respiratory: CTA bilaterally, no wheezing, no rhonchi, currently on 3 L nasal cannula Abdominal: Soft, NT, ND, bowel sounds + Extremities: no edema, no cyanosis    The results of significant diagnostics from this hospitalization (including imaging, microbiology, ancillary and laboratory) are listed below for reference.     Microbiology: Recent Results (from the past 240 hour(s))  SARS Coronavirus 2 (CEPHEID- Performed in [place] hospital lab), Hosp Order     Status:  None   Collection Time: 08/30/18 11:36 AM  Result Value Ref Range Status   SARS Coronavirus 2 NEGATIVE NEGATIVE Final    Comment: (NOTE) If result is NEGATIVE SARS-CoV-2 target nucleic acids are NOT DETECTED. The SARS-CoV-2 RNA is generally detectable in upper and lower  respiratory specimens during the acute phase of infection. The lowest  concentration of SARS-CoV-2 viral copies this assay can detect is 250  copies / mL. A negative result does not preclude SARS-CoV-2 infection  and should not be used as the sole basis for treatment or other  patient management decisions.  A negative result may occur with  improper specimen collection / handling, submission of specimen other  than nasopharyngeal swab, presence of viral mutation(s) within the  areas targeted by this assay, and inadequate number of viral copies  (<250 copies / mL). A negative result must be combined with clinical  observations, patient history, and epidemiological information. If result is POSITIVE SARS-CoV-2 target nucleic acids are DETECTED. The SARS-CoV-2 RNA is generally detectable in upper and lower  respiratory specimens dur ing the acute phase of infection.  Positive  results are indicative of active infection with SARS-CoV-2.  Clinical  correlation with patient history and other diagnostic information is  necessary to determine patient infection status.  Positive results do  not rule out bacterial infection or co-infection with other viruses. If result is PRESUMPTIVE POSTIVE SARS-CoV-2 nucleic acids MAY BE PRESENT.   A presumptive positive result was obtained on the submitted specimen  and confirmed on repeat testing.  While 2019 novel coronavirus  (SARS-CoV-2) nucleic acids may be present in the submitted sample  additional confirmatory testing may be necessary for epidemiological  and / or clinical management purposes  to differentiate between  SARS-CoV-2 and other Sarbecovirus currently known to infect  humans.  If clinically indicated additional testing with an alternate test  methodology [phone]) is advised. The SARS-CoV-2 RNA is generally  detectable in upper and lower respiratory sp ecimens during the acute  phase of infection. The expected result is Negative. Fact Sheet for Patients:  StrictlyIdeas.no Fact Sheet for Healthcare Providers: BankingDealers.co.za This test is not yet approved or cleared by the [place] FDA and has been authorized for detection and/or diagnosis of SARS-CoV-2 by FDA under an Emergency Use Authorization (EUA).  This EUA will remain in effect (meaning this test can be used) for the duration of the COVID-19 declaration under Section 564(b)(1) of the Act, 21 U.S.C. section 360bbb-3(b)(1), unless the authorization is terminated or revoked sooner. Performed at [facility], [address], [place],  [address]   Blood culture (routine x 2)     Status: None   Collection Time: 08/30/18 12:32 PM  Result Value Ref Range Status   Specimen Description LEFT ANTECUBITAL  Final   Special Requests   Final    BOTTLES DRAWN AEROBIC AND ANAEROBIC Blood Culture adequate volume   Culture   Final    NO GROWTH 5 DAYS Performed at [facility], [address] [place],  [place] [address]    Report Status 09/04/2018 FINAL  Final  Blood culture (routine x 2)     Status: None   Collection Time: 08/30/18 12:32 PM  Result Value Ref Range Status   Specimen Description BLOOD RIGHT FOREARM  Final   Special Requests   Final    BOTTLES DRAWN AEROBIC AND ANAEROBIC Blood Culture adequate volume   Culture   Final    NO GROWTH 5 DAYS Performed at [facility], [address] [place], [place] [address]    Report Status 09/04/2018 FINAL  Final  MRSA PCR Screening     Status: None   Collection Time: 08/30/18  5:33 PM  Result Value Ref Range Status   MRSA by PCR NEGATIVE NEGATIVE Final    Comment:        The GeneXpert MRSA  Assay (FDA approved for NASAL specimens only), is one component of a comprehensive MRSA colonization surveillance program. It is not intended to diagnose MRSA infection nor to guide or monitor treatment for MRSA infections. Performed at [facility], [address], [place], [place] [address]   Respiratory Panel by PCR     Status: None   Collection Time: 08/31/18  7:32 AM  Result Value Ref Range Status   Adenovirus NOT DETECTED NOT DETECTED Final   Coronavirus 229E NOT DETECTED NOT DETECTED Final    Comment: (NOTE) The Coronavirus on the Respiratory Panel, DOES NOT test for the novel  Coronavirus (2019 nCoV)    Coronavirus HKU1 NOT DETECTED NOT DETECTED Final   Coronavirus NL63 NOT DETECTED NOT DETECTED Final   Coronavirus OC43 NOT DETECTED NOT DETECTED Final   Metapneumovirus NOT DETECTED NOT DETECTED Final   Rhinovirus / Enterovirus NOT DETECTED NOT DETECTED Final   Influenza A NOT DETECTED NOT DETECTED Final   Influenza B NOT DETECTED NOT DETECTED Final   Parainfluenza Virus 1 NOT DETECTED NOT DETECTED Final   Parainfluenza Virus 2 NOT DETECTED NOT DETECTED Final   Parainfluenza Virus 3 NOT DETECTED NOT DETECTED Final   Parainfluenza Virus 4 NOT DETECTED NOT DETECTED Final   Respiratory Syncytial Virus NOT DETECTED NOT DETECTED Final   Bordetella pertussis NOT DETECTED NOT DETECTED Final   Chlamydophila pneumoniae NOT DETECTED NOT DETECTED Final   Mycoplasma pneumoniae NOT DETECTED NOT DETECTED Final    Comment: Performed at [facility], [place] [address], [place], [place] [address]     Labs: BNP (last 3 results) Recent Labs    07/30/18 1221 08/30/18 1232  BNP 94.0 93.7   Basic Metabolic Panel: Recent Labs  Lab 08/30/18 1232 09/01/18 0439 09/02/18 0433 09/03/18 0514 09/04/18 0553  NA 139 140 140 136 138  K 3.8 2.9* 4.5 4.5 4.5  CL 100 102 103 100 102  CO2 25 27 29 26 29   GLUCOSE 92 158* 156* 135* 131*  BUN 13 19 21  26* 24*  CREATININE 0.92 0.95 0.85  0.98 1.03  CALCIUM 8.8* 8.3* 8.3* 8.0* 8.0*  MG  --  2.0 2.0 2.0  --    Liver Function Tests: Recent Labs  Lab 08/29/18 1052 08/30/18 1232  AST 11 17  ALT 19 23  ALKPHOS 42 48  BILITOT 0.5 0.7  PROT 6.2 6.9  ALBUMIN 3.3* 3.2*   No results for input(s): LIPASE, AMYLASE in the last 168 hours. No results for input(s): AMMONIA in the last 168 hours. CBC: Recent Labs  Lab 08/29/18 1052 08/30/18 1232 09/01/18 0439 09/03/18 0514  WBC 14.4* 10.0 13.2* 13.6*  NEUTROABS 11.2* 8.7*  --   --   HGB 10.6* 10.4* 9.1* 10.4*  HCT 32.3* 32.6* 28.1* 34.2*  MCV 87.2 89.1 88.6 89.8  PLT 217.0 202 230 319   Cardiac Enzymes: Recent Labs  Lab 08/30/18 1232 08/30/18 1818 08/31/18 0019  TROPONINI 0.05* 0.05* 0.04*   BNP: Invalid input(s): POCBNP CBG: No results for input(s): GLUCAP in the last 168 hours. D-Dimer No results for input(s): DDIMER in the last 72 hours. Hgb A1c No results for input(s): HGBA1C in the last 72 hours. Lipid Profile No results for input(s): CHOL, HDL, LDLCALC, TRIG, CHOLHDL, LDLDIRECT in the last 72 hours. Thyroid function studies No results for input(s): TSH, T4TOTAL, T3FREE, THYROIDAB in the last 72 hours.  Invalid input(s): FREET3 Anemia work up No results for input(s): VITAMINB12, FOLATE, FERRITIN, TIBC, IRON, RETICCTPCT in the last 72 hours. Urinalysis    Component Value Date/Time   COLORURINE YELLOW 07/30/2018 1224   APPEARANCEUR CLEAR 07/30/2018 1224   LABSPEC 1.015 07/30/2018 1224   PHURINE 5.0 07/30/2018 1224   GLUCOSEU NEGATIVE 07/30/2018 1224   GLUCOSEU NEGATIVE 11/04/2015 0948   HGBUR MODERATE (A) 07/30/2018 1224   BILIRUBINUR NEGATIVE 07/30/2018 1224   BILIRUBINUR NEG 09/24/2013 1412   KETONESUR NEGATIVE 07/30/2018 1224   PROTEINUR NEGATIVE 07/30/2018 1224   UROBILINOGEN 0.2 11/04/2015 0948   NITRITE NEGATIVE 07/30/2018 1224   LEUKOCYTESUR NEGATIVE 07/30/2018 1224   Sepsis Labs Invalid input(s): PROCALCITONIN,  WBC,   LACTICIDVEN Microbiology Recent Results (from the past 240 hour(s))  SARS Coronavirus 2 (CEPHEID- Performed in [place] hospital lab), Hosp Order     Status: None   Collection Time: 08/30/18 11:36 AM  Result Value Ref Range Status   SARS Coronavirus 2 NEGATIVE NEGATIVE Final    Comment: (NOTE) If result is NEGATIVE SARS-CoV-2 target nucleic acids are NOT DETECTED. The SARS-CoV-2 RNA is generally detectable in upper and lower  respiratory specimens during the acute phase of infection. The lowest  concentration of SARS-CoV-2 viral copies this assay can detect is 250  copies / mL. A negative result does not preclude SARS-CoV-2 infection  and should not be used as the sole basis for treatment or other  patient management decisions.  A negative result may occur with  improper specimen collection / handling, submission of specimen other  than nasopharyngeal swab, presence of viral mutation(s) within the  areas targeted by this assay, and inadequate number of viral copies  (<250 copies / mL). A negative result must be combined with clinical  observations, patient history, and epidemiological information. If result is POSITIVE SARS-CoV-2 target nucleic acids are DETECTED. The SARS-CoV-2 RNA is generally detectable in upper and lower  respiratory specimens dur ing the acute phase of infection.  Positive  results are indicative of active infection with SARS-CoV-2.  Clinical  correlation with patient history and other diagnostic information is  necessary to determine patient infection status.  Positive results do  not rule out bacterial infection or co-infection with other viruses. If result is PRESUMPTIVE POSTIVE SARS-CoV-2 nucleic acids MAY BE PRESENT.   A presumptive positive result was obtained on the submitted specimen  and confirmed on repeat testing.  While 2019 novel coronavirus  (SARS-CoV-2) nucleic acids may be present in the submitted sample  additional confirmatory testing may  be necessary for epidemiological  and / or clinical management purposes  to differentiate between  SARS-CoV-2 and other Sarbecovirus currently known to infect humans.  If clinically indicated additional testing with an alternate test  methodology (  [id number]) is advised. The SARS-CoV-2 RNA is generally  detectable in upper and lower respiratory sp ecimens during the acute  phase of infection. The expected result is Negative. Fact Sheet for Patients:  StrictlyIdeas.no Fact Sheet for Healthcare Providers: BankingDealers.co.za This test is not yet approved or cleared by the [place] FDA and has been authorized for detection and/or diagnosis of SARS-CoV-2 by FDA under an Emergency Use Authorization (EUA).  This EUA will remain in effect (meaning this test can be used) for the duration of the COVID-19 declaration under Section 564(b)(1) of the Act, 21 U.S.C. section 360bbb-3(b)(1), unless the authorization is terminated or revoked sooner. Performed at [facility], [address], [place], [place] [address]   Blood culture (routine x 2)     Status: None   Collection Time: 08/30/18 12:32 PM  Result Value Ref Range Status   Specimen Description LEFT ANTECUBITAL  Final   Special Requests   Final    BOTTLES DRAWN AEROBIC AND ANAEROBIC Blood Culture adequate volume   Culture   Final    NO GROWTH 5 DAYS Performed at [facility], [address] [place], [place] [address]    Report Status 09/04/2018 FINAL  Final  Blood culture (routine x 2)     Status: None   Collection Time: 08/30/18 12:32 PM  Result Value Ref Range Status   Specimen Description BLOOD RIGHT FOREARM  Final   Special Requests   Final    BOTTLES DRAWN AEROBIC AND ANAEROBIC Blood Culture adequate volume   Culture   Final    NO GROWTH 5 DAYS Performed at [facility], [address], [place], [place] [address]    Report Status 09/04/2018 FINAL  Final  MRSA PCR Screening      Status: None   Collection Time: 08/30/18  5:33 PM  Result Value Ref Range Status   MRSA by PCR NEGATIVE NEGATIVE Final    Comment:        The GeneXpert MRSA Assay (FDA approved for NASAL specimens only), is one component of a comprehensive MRSA colonization surveillance program. It is not intended to diagnose MRSA infection nor to guide or monitor treatment for MRSA infections. Performed at [facility], [address], [place], [place] [address]   Respiratory Panel by PCR     Status: None   Collection Time: 08/31/18  7:32 AM  Result Value Ref Range Status   Adenovirus NOT DETECTED NOT DETECTED Final   Coronavirus 229E NOT DETECTED NOT DETECTED Final    Comment: (NOTE) The Coronavirus on the Respiratory Panel, DOES NOT test for the novel  Coronavirus (2019 nCoV)    Coronavirus HKU1 NOT DETECTED NOT DETECTED Final   Coronavirus NL63 NOT DETECTED NOT DETECTED Final   Coronavirus OC43 NOT DETECTED NOT DETECTED Final   Metapneumovirus NOT DETECTED NOT DETECTED Final   Rhinovirus / Enterovirus NOT DETECTED NOT DETECTED Final   Influenza A NOT DETECTED NOT DETECTED Final   Influenza B NOT DETECTED NOT DETECTED Final   Parainfluenza Virus 1 NOT DETECTED NOT DETECTED Final   Parainfluenza Virus 2 NOT DETECTED NOT DETECTED Final   Parainfluenza Virus 3 NOT DETECTED NOT DETECTED Final   Parainfluenza Virus 4 NOT DETECTED NOT DETECTED Final   Respiratory Syncytial Virus NOT DETECTED NOT DETECTED Final   Bordetella pertussis NOT DETECTED NOT DETECTED Final   Chlamydophila pneumoniae NOT DETECTED NOT DETECTED Final   Mycoplasma pneumoniae NOT DETECTED NOT DETECTED Final    Comment: Performed at [facility],  [address], [place], [place] [address]     Time coordinating discharge: 35 minutes  SIGNED:   Rodena Goldmann, DO [facility] 09/04/2018, 12:25 PM  If 7PM-7AM, please contact night-coverage [email or web address] Password [email or web address]

## 2018-09-05 DIAGNOSIS — M05741 Rheumatoid arthritis with rheumatoid factor of right hand without organ or systems involvement: Secondary | ICD-10-CM

## 2018-09-05 DIAGNOSIS — J8410 Pulmonary fibrosis, unspecified: Secondary | ICD-10-CM

## 2018-09-05 DIAGNOSIS — M05742 Rheumatoid arthritis with rheumatoid factor of left hand without organ or systems involvement: Secondary | ICD-10-CM

## 2018-09-05 DIAGNOSIS — Z515 Encounter for palliative care: Secondary | ICD-10-CM

## 2018-09-05 MED ORDER — AZITHROMYCIN 250 MG PO TABS
250.0000 mg | ORAL_TABLET | Freq: Every day | ORAL | Status: DC
  Administered 2018-09-06: 09:00:00 250 mg via ORAL
  Filled 2018-09-05: qty 1

## 2018-09-05 MED ORDER — HYDROCOD POLST-CPM POLST ER 10-8 MG/5ML PO SUER
5.0000 mL | Freq: Two times a day (BID) | ORAL | Status: DC
  Administered 2018-09-05 – 2018-09-06 (×3): 5 mL via ORAL
  Filled 2018-09-05 (×3): qty 5

## 2018-09-05 MED ORDER — DICLOFENAC SODIUM 1 % TD GEL
4.0000 g | Freq: Four times a day (QID) | TRANSDERMAL | Status: DC
  Administered 2018-09-05 – 2018-09-06 (×4): 4 g via TOPICAL
  Filled 2018-09-05: qty 100

## 2018-09-05 MED ORDER — AZITHROMYCIN 250 MG PO TABS
500.0000 mg | ORAL_TABLET | Freq: Every day | ORAL | Status: AC
  Administered 2018-09-05: 500 mg via ORAL
  Filled 2018-09-05: qty 2

## 2018-09-05 MED ORDER — FUROSEMIDE 10 MG/ML IJ SOLN
40.0000 mg | Freq: Once | INTRAMUSCULAR | Status: AC
  Administered 2018-09-05: 15:00:00 40 mg via INTRAVENOUS
  Filled 2018-09-05: qty 4

## 2018-09-05 MED ORDER — FUROSEMIDE 10 MG/ML IJ SOLN
40.0000 mg | Freq: Two times a day (BID) | INTRAMUSCULAR | Status: DC
  Administered 2018-09-05 – 2018-09-06 (×2): 40 mg via INTRAVENOUS
  Filled 2018-09-05 (×2): qty 4

## 2018-09-05 MED ORDER — HYDROCODONE-ACETAMINOPHEN 5-325 MG PO TABS: 1.0000 | ORAL_TABLET | Freq: Four times a day (QID) | ORAL | 0 refills | Status: DC | PRN

## 2018-09-05 NOTE — TOC Progression Note (Signed)
Transition of Care (TOC) - Progression Note    Patient Details  Name: Joel Valdez. [place] MRN: [id number] Date of Birth: 08-03-1945  Transition of Care [facility]) CM/SW Contact  Roda Shutters [place], RN Phone Number: 09/05/2018, 4:45 PM  Clinical Narrative:     Palliative consulted today. Per MD discussion with wife, wife would prefer pt go for some STR prior to coming home. CM spoke with [place] who [place]. CM spoke with pt who is not certain he will agree to placement and would like to think about it tonight and begin bed search in AM if agreeable. CM has made wife aware pt must agree to placement. TOC will visit pt in AM.

## 2018-09-05 NOTE — TOC Transition Note (Signed)
Transition of Care [facility]) - CM/SW Discharge Note   Patient Details  Name: Joel Valdez MRN: [id number] Date of Birth: 01-22-1946  Transition of Care [facility]) CM/SW Contact:  Sherald Barge, RN Phone Number: 09/05/2018, 11:48 AM   Clinical Narrative:   Discharging home today. CM verified with wife she no longer wants to use [facility] and has requested [facility]. Per [facility] rep, they have discharged the patient from their services. Wife verbalizes some concerns about pt's readiness for DC, she has not spoken to a MD. CM has encouraged her to voice those concerns to MD and has sent a message tot he MD to give wife a call to provide update.    Final next level of care: [place] Barriers to Discharge: No Barriers Identified   Patient Goals and CMS Choice Patient states their goals for this hospitalization and ongoing recovery are:: not to have to come back to the hospital CMS Medicare.gov Compare Post Acute Care list provided to:: Patient Represenative (must comment)(wife) Choice offered to / list presented to : Spouse  Discharge Plan and Services In-house Referral: NA Discharge Planning Services: CM Consult, Follow-up appt scheduled             HH Arranged: OT, PT HH Agency: [facility] (formerly [employer]) Date [place]: 09/05/18 Time [place]: 1148 Representative spoke with at [place]: [place]     Readmission Risk Interventions Readmission Risk Prevention Plan 09/05/2018 08/31/2018  Transportation Screening - Complete  PCP or Specialist Appt within 3-5 Days - Complete  HRI or [place] - Complete  Social Work Consult for [place] Planning/Counseling - Complete  Palliative Care Screening - Not Applicable  Medication Review Press photographer) - Complete  PCP or Specialist appointment within 3-5 days of discharge Complete -  [place] or [place] Complete -  SW Recovery Care/Counseling  Consult Complete -  Palliative Care Screening Not Applicable -  [place] Not Applicable -  Some recent data might be hidden

## 2018-09-05 NOTE — Care Management Important Message (Signed)
Important Message  Patient Details  Name: Joel Valdez MRN: [id number] Date of Birth: 1946-04-09   Medicare Important Message Given:  Yes    Tommy Medal 09/05/2018, 2:55 PM

## 2018-09-05 NOTE — NC FL2 (Signed)
[place] LEVEL OF CARE SCREENING TOOL     IDENTIFICATION  Patient Name: Joel Valdez Birthdate: Nov 21, 1945 Sex: male Admission Date (Current Location): 08/30/2018  [place] and [place] Number:  [employer] and Address:  [place] [address] [place]      Provider Number: [id number]  Attending Physician Name and Address:  Debbe Odea, MD  Relative Name and Phone Number:  Takashi Korol [phone]    Current Level of Care: Hospital Recommended Level of Care: [place] Prior Approval Number:    Date Approved/Denied:   PASRR Number:    Discharge Plan: SNF    Current Diagnoses: Patient Active Problem List   Diagnosis Date Noted  . Acute on chronic respiratory failure ([place]) 08/30/2018  . DOE (dyspnea on exertion) 08/29/2018  . Abnormal thyroid function test 08/29/2018  . Chronic respiratory failure with hypoxia ([place]) 08/29/2018  . Cor pulmonale (chronic) ([place]) 08/29/2018  . Acute prerenal azotemia 08/23/2018  . 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 Pressure injury of skin 08/23/2018  . Chest pain 08/22/2018  . HCAP (healthcare-associated pneumonia) 08/22/2018  . Memory loss 03/30/2018  . Hyperlipidemia 04/17/2016  . Pelvic lymphadenopathy   . Rheumatoid arthritis involving both hands ([place]) 01/26/2015  . Depression 11/21/2014  . Prostate cancer ([place]) 04/28/2014  . Bruit 11/13/2013  . Abnormal electrocardiogram 11/13/2013  . Glaucoma 09/27/2013  . Essential hypertension, benign 09/27/2013  . Nocturia 09/27/2013  . Colon cancer screening 09/27/2013    Orientation RESPIRATION BLADDER Height & Weight     Self, Time, Situation, Place  O2(4lpm) Continent Weight: 69.9 kg Height:  5\' 6"  (167.6 cm)  BEHAVIORAL SYMPTOMS/MOOD NEUROLOGICAL BOWEL NUTRITION STATUS  (N/A) (N/A) Continent Diet(heart healthy)  AMBULATORY STATUS COMMUNICATION OF NEEDS Skin   Extensive Assist Verbally PU  Stage and Appropriate Care, Bruising(Bruisng  Bilateral arms)   PU Stage 2 Dressing: Daily                   Personal Care Assistance Level of Assistance  Bathing, Feeding, Dressing Bathing Assistance: Limited assistance Feeding assistance: Independent Dressing Assistance: Limited assistance     Functional Limitations Info  Sight, Hearing, Speech Sight Info: Impaired Hearing Info: Impaired Speech Info: Adequate    SPECIAL CARE FACTORS FREQUENCY  PT (By licensed PT)     PT Frequency: 5 days/week              Contractures Contractures Info: Not present    Additional Factors Info  Code Status, Allergies, Psychotropic Code Status Info: DNR Allergies Info: methotrexate derivatives Psychotropic Info: lexapro         Current Medications (09/05/2018):  This is the current hospital active medication list Current Facility-Administered Medications  Medication Dose Route Frequency Provider Last Rate Last Dose  . 0.9 %  sodium chloride infusion   Intravenous PRN Emokpae, Ejiroghene E, MD   Stopped at 09/01/18 1852  . acetaminophen (TYLENOL) tablet 650 mg  650 mg Oral Q6H PRN Emokpae, Ejiroghene E, MD       Or  . acetaminophen (TYLENOL) suppository 650 mg  650 mg Rectal Q6H PRN Emokpae, Ejiroghene E, MD      . albuterol (PROVENTIL) (2.5 MG/3ML) 0.083% nebulizer solution 2.5 mg  2.5 mg Nebulization Q4H PRN Emokpae, Ejiroghene E, MD   2.5 mg at 09/04/18 2305  . atorvastatin (LIPITOR) tablet 10 mg  10 mg Oral QHS Emokpae, Ejiroghene E, MD   10 mg at 09/04/18 2243  . [  START ON 09/06/2018] azithromycin (ZITHROMAX) tablet 250 mg  250 mg Oral Daily Rizwan, Eunice Blase, MD      . budesonide (PULMICORT) nebulizer solution 0.25 mg  0.25 mg Nebulization BID Manuella Ghazi, Pratik D, DO   0.25 mg at 09/05/18 0738  . Chlorhexidine Gluconate Cloth 2 % PADS 6 each  6 each Topical Daily Heath Lark D, DO   6 each at 09/04/18 0933  . chlorpheniramine-HYDROcodone (TUSSIONEX) 10-8 MG/5ML suspension 5 mL  5 mL  Oral Q12H Rizwan, Saima, MD   5 mL at 09/05/18 1455  . diclofenac sodium (VOLTAREN) 1 % transdermal gel 4 g  4 g Topical QID Rizwan, Eunice Blase, MD      . enoxaparin (LOVENOX) injection 40 mg  40 mg Subcutaneous Q24H Emokpae, Ejiroghene E, MD   40 mg at 09/04/18 2243  . escitalopram (LEXAPRO) tablet 10 mg  10 mg Oral Daily Emokpae, Ejiroghene E, MD   10 mg at 09/05/18 0826  . folic acid (FOLVITE) tablet 1 mg  1 mg Oral Daily Emokpae, Ejiroghene E, MD   1 mg at 09/05/18 0827  . furosemide (LASIX) injection 40 mg  40 mg Intravenous BID Debbe Odea, MD      . guaiFENesin-dextromethorphan (ROBITUSSIN DM) 100-10 MG/5ML syrup 5 mL  5 mL Oral Q4H PRN Manuella Ghazi, Pratik D, DO   5 mL at 09/03/18 0502  . HYDROcodone-acetaminophen (NORCO/VICODIN) 5-325 MG per tablet 1 tablet  1 tablet Oral Q6H PRN Heath Lark D, DO   1 tablet at 09/05/18 [phone]  . ipratropium-albuterol (DUONEB) 0.5-2.5 (3) MG/3ML nebulizer solution 3 mL  3 mL Nebulization TID Manuella Ghazi, Pratik D, DO   3 mL at 09/05/18 1500  . lisinopril (ZESTRIL) tablet 10 mg  10 mg Oral Daily Manuella Ghazi, Pratik D, DO   10 mg at 09/05/18 0826  . methylPREDNISolone sodium succinate (SOLU-MEDROL) 40 mg/mL injection 40 mg  40 mg Intravenous Q12H Shah, Pratik D, DO   40 mg at 09/05/18 0827  . ondansetron (ZOFRAN) tablet 4 mg  4 mg Oral Q6H PRN Emokpae, Ejiroghene E, MD       Or  . ondansetron (ZOFRAN) injection 4 mg  4 mg Intravenous Q6H PRN Emokpae, Ejiroghene E, MD   4 mg at 09/04/18 1826  . polyethylene glycol (MIRALAX / GLYCOLAX) packet 17 g  17 g Oral Daily PRN Emokpae, Ejiroghene E, MD         Discharge Medications: Please see discharge summary for a list of discharge medications.  Relevant Imaging Results:  Relevant Lab Results:   Additional Information SS# [id number], [place], [place]
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
## 2018-09-05 NOTE — Plan of Care (Signed)

## 2018-09-05 NOTE — Plan of Care (Signed)
Problem: Education: Goal: Knowledge of General Education information will improve Description Including pain rating scale, medication(s)/side effects and non-pharmacologic comfort measures 09/05/2018 1226 by Rance Muir, RN Outcome: Adequate for Discharge 09/05/2018 0933 by Rance Muir, RN Outcome: Progressing   Problem: Health Behavior/Discharge Planning: Goal: Ability to manage health-related needs will improve 09/05/2018 1226 by Rance Muir, RN Outcome: Adequate for Discharge 09/05/2018 0933 by Rance Muir, RN Outcome: Progressing   Problem: Clinical Measurements: Goal: Ability to maintain clinical measurements within normal limits will improve 09/05/2018 1226 by Rance Muir, RN Outcome: Adequate for Discharge 09/05/2018 0933 by Rance Muir, RN Outcome: Progressing Goal: Will remain free from infection 09/05/2018 1226 by Rance Muir, RN Outcome: Adequate for Discharge 09/05/2018 0933 by Rance Muir, RN Outcome: Progressing Goal: Diagnostic test results will improve 09/05/2018 1226 by Rance Muir, RN Outcome: Adequate for Discharge 09/05/2018 0933 by Rance Muir, RN Outcome: Progressing Goal: Respiratory complications will improve 09/05/2018 1226 by Rance Muir, RN Outcome: Adequate for Discharge 09/05/2018 0933 by Rance Muir, RN Outcome: Progressing Goal: Cardiovascular complication will be avoided 09/05/2018 1226 by Rance Muir, RN Outcome: Adequate for Discharge 09/05/2018 0933 by Rance Muir, RN Outcome: Progressing   Problem: Activity: Goal: Risk for activity intolerance will decrease 09/05/2018 1226 by Rance Muir, RN Outcome: Adequate for Discharge 09/05/2018 0933 by Rance Muir, RN Outcome: Progressing   Problem: Nutrition: Goal: Adequate nutrition will be maintained 09/05/2018 1226 by Rance Muir, RN Outcome: Adequate for Discharge 09/05/2018 0933 by Rance Muir, RN Outcome: Progressing   Problem: Coping: Goal: Level of anxiety will decrease 09/05/2018 1226 by Rance Muir, RN Outcome: Adequate for  Discharge 09/05/2018 0933 by Rance Muir, RN Outcome: Progressing   Problem: Elimination: Goal: Will not experience complications related to bowel motility 09/05/2018 1226 by Rance Muir, RN Outcome: Adequate for Discharge 09/05/2018 0933 by Rance Muir, RN Outcome: Progressing Goal: Will not experience complications related to urinary retention 09/05/2018 1226 by Rance Muir, RN Outcome: Adequate for Discharge 09/05/2018 0933 by Rance Muir, RN Outcome: Progressing   Problem: Pain Managment: Goal: General experience of comfort will improve 09/05/2018 1226 by Rance Muir, RN Outcome: Adequate for Discharge 09/05/2018 0933 by Rance Muir, RN Outcome: Progressing   Problem: Safety: Goal: Ability to remain free from injury will improve 09/05/2018 1226 by Rance Muir, RN Outcome: Adequate for Discharge 09/05/2018 0933 by Rance Muir, RN Outcome: Progressing   Problem: Skin Integrity: Goal: Risk for impaired skin integrity will decrease 09/05/2018 1226 by Rance Muir, RN Outcome: Adequate for Discharge 09/05/2018 0933 by Rance Muir, RN Outcome: Progressing   Problem: Education: Goal: Knowledge of disease or condition will improve 09/05/2018 1226 by Rance Muir, RN Outcome: Adequate for Discharge 09/05/2018 0933 by Rance Muir, RN Outcome: Progressing Goal: Knowledge of the prescribed therapeutic regimen will improve 09/05/2018 1226 by Rance Muir, RN Outcome: Adequate for Discharge 09/05/2018 0933 by Rance Muir, RN Outcome: Progressing Goal: Individualized Educational Video(s) 09/05/2018 1226 by Rance Muir, RN Outcome: Adequate for Discharge 09/05/2018 0933 by Rance Muir, RN Outcome: Progressing   Problem: Activity: Goal: Ability to tolerate increased activity will improve 09/05/2018 1226 by Rance Muir, RN Outcome: Adequate for Discharge 09/05/2018 0933 by Rance Muir, RN Outcome: Progressing Goal: Will verbalize the importance of balancing activity with adequate rest periods 09/05/2018 1226 by Rance Muir,  RN Outcome: Adequate for Discharge 09/05/2018 0933 by Rance Muir, RN Outcome: Progressing   Problem: Respiratory: Goal: Ability to maintain a clear airway will improve 09/05/2018 1226 by Rance Muir, RN Outcome: Adequate for Discharge 09/05/2018  0933 by Rance Muir, RN Outcome: Progressing Goal: Levels of oxygenation will improve 09/05/2018 1226 by Rance Muir, RN Outcome: Adequate for Discharge 09/05/2018 0933 by Rance Muir, RN Outcome: Progressing Goal: Ability to maintain adequate ventilation will improve 09/05/2018 1226 by Rance Muir, RN Outcome: Adequate for Discharge 09/05/2018 0933 by Rance Muir, RN Outcome: Progressing

## 2018-09-05 NOTE — Progress Notes (Signed)
Physical Therapy Treatment Patient Details Name: Joel Valdez. Sensing MRN: [id number] DOB: 1945-12-09 Today's Date: 09/05/2018    History of Present Illness Joel Valdez is a 73 y.o. male with medical history significant for interstitial lung disease chronic respiratory failure, hypertension, rheumatoid arthritis, immunosuppression, who presented to the ED with complaints of difficulty breathing started about 3 hours prior to arrival in the ED.  Reports worsening of his chronic cough also, but his cough over the past 3 weeks has been nonproductive.  Reports worsening lower extremity swelling R>L, without pain redness or warmth.  No fever or chills.    PT Comments    Pt supine and willing to participate with therapy.  Pt limited by chest pain on Lt side, SOB and decreased O2 saturation.  Pt independent with bed mobility.  Assessed O2 saturationon sitting with fast reduction in saturation following 1 minute of sitting on EOB.  Decreased saturation to 84-87% and HR went from 98 to 123 bmp just sitting.  Pt instructed diaphragmatic breathing and increased to L4 O2 A via nasal cannula which did not increase saturation.  Following discussion with RN pt transfered to chair with supervision.  No gait training this session due to saturation levels.  No reports of increased pain.  Pt left in chair with call bell within reach and RN aware of status.   Follow Up Recommendations  Home health PT;Outpatient PT     Equipment Recommendations  None recommended by PT    Recommendations for Other Services       Precautions / Restrictions Precautions Precautions: Fall Precaution Comments: monitor sats  Restrictions Weight Bearing Restrictions: No    Mobility  Bed Mobility Overal bed mobility: Independent                Transfers Overall transfer level: Independent   Transfers: Sit to/from Stand Sit to Stand: Independent         General transfer comment: no cueing  required  Ambulation/Gait   Gait Distance (Feet): 3 Feet Assistive device: None Gait Pattern/deviations: Decreased step length - right;Decreased step length - left;Decreased stride length Gait velocity: decreased   General Gait Details: Due to O2 saturation, transfer to chair only and diaphragmatic breathing instructed to assist with saturation   Stairs             Wheelchair Mobility    Modified Rankin (Stroke Patients Only)       Balance                                            Cognition Arousal/Alertness: Awake/alert Behavior During Therapy: WFL for tasks assessed/performed Overall Cognitive Status: Within Functional Limits for tasks assessed                                        Exercises      General Comments        Pertinent Vitals/Pain Pain Assessment: 0-10 Pain Score: 8  Pain Location: left side upper chest Pain Descriptors / Indicators: Sore Pain Intervention(s): Limited activity within patient's tolerance;Monitored during session;Premedicated before session(RN aware of chest pain and premedicated prior sessoin)    Home Living                      Prior  Function            PT Goals (current goals can now be found in the care plan section)      Frequency    Min 3X/week      PT Plan Current plan remains appropriate    Co-evaluation              AM-PAC PT "6 Clicks" Mobility   Outcome Measure  Help needed turning from your back to your side while in a flat bed without using bedrails?: None Help needed moving from lying on your back to sitting on the side of a flat bed without using bedrails?: None Help needed moving to and from a bed to a chair (including a wheelchair)?: None Help needed standing up from a chair using your arms (e.g., wheelchair or bedside chair)?: None Help needed to walk in hospital room?: A Little Help needed climbing 3-5 steps with a railing? : A Little 6  Click Score: 22    End of Session Equipment Utilized During Treatment: Oxygen Activity Tolerance: Patient tolerated treatment well;Patient limited by fatigue(Limited by O2 saturation/SOB) Patient left: in chair;with call bell/phone within reach Nurse Communication: Mobility status PT Visit Diagnosis: Unsteadiness on feet (R26.81);Other abnormalities of gait and mobility (R26.89);Muscle weakness (generalized) (M62.81)     Time: 4034-7425 PT Time Calculation (min) (ACUTE ONLY): 15 min  Charges:  $Therapeutic Activity: 8-22 mins                     [address] LPTA; [facility] [phone]  Aldona Lento 09/05/2018, 10:30 AM

## 2018-09-05 NOTE — Progress Notes (Signed)
PROGRESS NOTE    Joel Valdez   GDJ:[id number]  DOB: 16-Dec-1945  DOA: 08/30/2018 PCP: [name], PA-C   Brief Narrative:  Joel Valdez is a 73 y/o male with Rh arthritis and pulmonary fibrosis, prostate cancer, HTN and GERD. He was admitted for dyspnea and cough.  He was admitted in early April (07/30/18- 08/15/18) for b/l pneumonia to [facility] and was intubated. After that hospital stay, he required continuous O2 . He meat with Dr Melvyn Novas who evaluated him in the office and recommended he continue O2. He was on 3-4 L at baseline. He was readmitted on 4/29 through 4/30 for chest pain. CTA was negative and he was discharged home. He returned on 09/04/18 for ongoing dyspnea and severe cough.   CTA > Redemonstration of advanced interstitial fibrosis, with likely superimposed acute bronchitis/atypical infection given that there is slight increased prominence of the interstitial opacities, enlarging right pleural effusion, and new left pleural effusion. He was started on Diuretics and Solumedrol.   Subjective: Has left sided chest pain.     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)  -I have had a long discussion with wife who states that he has become so short of breath that he can barely walk and she is not able to take care of him at home- we have further discussed the severity of his fibrosis and the fact that this will not be reversed- she and I agree that a palliative care consult is needed to determine the goals of  care      Active Problems:   Rheumatoid arthritis involving both hands  - Methotrexate on hold  Prostate Cancer  - on Lupron injections every 3 months      Time spent in minutes: 35 DVT prophylaxis: Lovenox Code Status: no CPR, ACLS meds, defibrillation or cardioversion Family Communication: wife Disposition Plan: to be determined Consultants:   pulmonary Procedures:    Antimicrobials:  Anti-infectives (From admission, onward)   Start     Dose/Rate Route Frequency Ordered Stop   09/06/18 1000  azithromycin (ZITHROMAX) tablet 250 mg     250 mg Oral Daily 09/05/18 1341 09/10/18 0959   09/05/18 1345  azithromycin (ZITHROMAX) tablet 500 mg     500 mg Oral Daily 09/05/18 1341 09/05/18 1447   08/30/18 1630  azithromycin (ZITHROMAX) 500 mg in sodium chloride 0.9 % 250 mL IVPB  Status:  Discontinued     500 mg 250 mL/hr over 60 Minutes Intravenous Every 24 hours 08/30/18 1624 09/04/18 0942       Objective: Vitals:   09/05/18 0738 09/05/18 1025 09/05/18 1450 09/05/18 1500  BP:   139/87   Pulse:  98 90   Resp:   20   Temp:   98.4 F (36.9 C)   TempSrc:   Oral   SpO2: 96% (!) 87% 98% 97%  Weight:      Height:        Intake/Output Summary (Last 24 hours) at 09/05/2018 1523 Last data filed at 09/04/2018 1900 Gross per  24 hour  Intake 480 ml  Output -  Net 480 ml   Filed Weights   09/03/18 0500 09/04/18 0552 09/05/18 0517  Weight: 68.9 kg 69.6 kg 69.9 kg    Examination: General exam: Appears comfortable  HEENT: PERRLA, oral mucosa moist, no sclera icterus or thrush Respiratory system: b/l crackles + cough. Respiratory effort normal. Cardiovascular system: S1 & S2 heard, RRR.   Gastrointestinal system: Abdomen soft, non-tender, nondistended. Normal bowel sounds. Central nervous system: Alert and oriented. No focal neurological deficits. Extremities: No cyanosis, clubbing or edema Skin: No rashes or ulcers Psychiatry:  Mood & affect appropriate.    Data  Reviewed: I have personally reviewed following labs and imaging studies  CBC: Recent Labs  Lab 08/30/18 1232 09/01/18 0439 09/03/18 0514  WBC 10.0 13.2* 13.6*  NEUTROABS 8.7*  --   --   HGB 10.4* 9.1* 10.4*  HCT 32.6* 28.1* 34.2*  MCV 89.1 88.6 89.8  PLT 202 230 539   Basic Metabolic Panel: Recent Labs  Lab 08/30/18 1232 09/01/18 0439 09/02/18 0433 09/03/18 0514 09/04/18 0553  NA 139 140 140 136 138  K 3.8 2.9* 4.5 4.5 4.5  CL 100 102 103 100 102  CO2 25 27 29 26 29   GLUCOSE 92 158* 156* 135* 131*  BUN 13 19 21  26* 24*  CREATININE 0.92 0.95 0.85 0.98 1.03  CALCIUM 8.8* 8.3* 8.3* 8.0* 8.0*  MG  --  2.0 2.0 2.0  --    GFR: Estimated Creatinine Clearance: 57.6 mL/min (by C-G formula based on SCr of 1.03 mg/dL). Liver Function Tests: Recent Labs  Lab 08/30/18 1232  AST 17  ALT 23  ALKPHOS 48  BILITOT 0.7  PROT 6.9  ALBUMIN 3.2*   No results for input(s): LIPASE, AMYLASE in the last 168 hours. No results for input(s): AMMONIA in the last 168 hours. Coagulation Profile: No results for input(s): INR, PROTIME in the last 168 hours. Cardiac Enzymes: Recent Labs  Lab 08/30/18 1232 08/30/18 1818 08/31/18 0019  TROPONINI 0.05* 0.05* 0.04*   BNP (last 3 results) Recent Labs    08/29/18 1052  PROBNP 96.0   HbA1C: No results for input(s): HGBA1C in the last 72 hours. CBG: No results for input(s): GLUCAP in the last 168 hours. Lipid Profile: No results for input(s): CHOL, HDL, LDLCALC, TRIG, CHOLHDL, LDLDIRECT in the last 72 hours. Thyroid Function Tests: No results for input(s): TSH, T4TOTAL, FREET4, T3FREE, THYROIDAB in the last 72 hours. Anemia Panel: No results for input(s): VITAMINB12, FOLATE, FERRITIN, TIBC, IRON, RETICCTPCT in the last 72 hours. Urine analysis:    Component Value Date/Time   COLORURINE YELLOW 07/30/2018 1224   APPEARANCEUR CLEAR 07/30/2018 1224   LABSPEC 1.015 07/30/2018 1224   PHURINE 5.0 07/30/2018 1224   GLUCOSEU NEGATIVE  07/30/2018 1224   GLUCOSEU NEGATIVE 11/04/2015 0948   HGBUR MODERATE (A) 07/30/2018 1224   BILIRUBINUR NEGATIVE 07/30/2018 1224   BILIRUBINUR NEG 09/24/2013 1412   KETONESUR NEGATIVE 07/30/2018 1224   PROTEINUR NEGATIVE 07/30/2018 1224   UROBILINOGEN 0.2 11/04/2015 0948   NITRITE NEGATIVE 07/30/2018 1224   LEUKOCYTESUR NEGATIVE 07/30/2018 1224   Sepsis Labs: @LABRCNTIP (procalcitonin:4,lacticidven:4) ) Recent Results (from the past 240 hour(s))  SARS Coronavirus 2 (CEPHEID- Performed in [place] hospital lab), Hosp Order     Status: None   Collection Time: 08/30/18 11:36 AM  Result Value Ref Range Status   SARS Coronavirus 2 NEGATIVE NEGATIVE Final    Comment: (NOTE) If result is NEGATIVE SARS-CoV-2 target nucleic  acids are NOT DETECTED. The SARS-CoV-2 RNA is generally detectable in upper and lower  respiratory specimens during the acute phase of infection. The lowest  concentration of SARS-CoV-2 viral copies this assay can detect is 250  copies / mL. A negative result does not preclude SARS-CoV-2 infection  and should not be used as the sole basis for treatment or other  patient management decisions.  A negative result may occur with  improper specimen collection / handling, submission of specimen other  than nasopharyngeal swab, presence of viral mutation(s) within the  areas targeted by this assay, and inadequate number of viral copies  (<250 copies / mL). A negative result must be combined with clinical  observations, patient history, and epidemiological information. If result is POSITIVE SARS-CoV-2 target nucleic acids are DETECTED. The SARS-CoV-2 RNA is generally detectable in upper and lower  respiratory specimens dur ing the acute phase of infection.  Positive  results are indicative of active infection with SARS-CoV-2.  Clinical  correlation with patient history and other diagnostic information is  necessary to determine patient infection status.  Positive results  do  not rule out bacterial infection or co-infection with other viruses. If result is PRESUMPTIVE POSTIVE SARS-CoV-2 nucleic acids MAY BE PRESENT.   A presumptive positive result was obtained on the submitted specimen  and confirmed on repeat testing.  While 2019 novel coronavirus  (SARS-CoV-2) nucleic acids may be present in the submitted sample  additional confirmatory testing may be necessary for epidemiological  and / or clinical management purposes  to differentiate between  SARS-CoV-2 and other Sarbecovirus currently known to infect humans.  If clinically indicated additional testing with an alternate test  methodology [phone]) is advised. The SARS-CoV-2 RNA is generally  detectable in upper and lower respiratory sp ecimens during the acute  phase of infection. The expected result is Negative. Fact Sheet for Patients:  StrictlyIdeas.no Fact Sheet for Healthcare Providers: BankingDealers.co.za This test is not yet approved or cleared by the [place] FDA and has been authorized for detection and/or diagnosis of SARS-CoV-2 by FDA under an Emergency Use Authorization (EUA).  This EUA will remain in effect (meaning this test can be used) for the duration of the COVID-19 declaration under Section 564(b)(1) of the Act, 21 U.S.C. section 360bbb-3(b)(1), unless the authorization is terminated or revoked sooner. Performed at [facility], [address] [place], [place] [address]   Blood culture (routine x 2)     Status: None   Collection Time: 08/30/18 12:32 PM  Result Value Ref Range Status   Specimen Description LEFT ANTECUBITAL  Final   Special Requests   Final    BOTTLES DRAWN AEROBIC AND ANAEROBIC Blood Culture adequate volume   Culture   Final    NO GROWTH 5 DAYS Performed at [facility], [address] [place], [place] [address]    Report Status 09/04/2018 FINAL  Final  Blood culture (routine x 2)     Status: None    Collection Time: 08/30/18 12:32 PM  Result Value Ref Range Status   Specimen Description BLOOD RIGHT FOREARM  Final   Special Requests   Final    BOTTLES DRAWN AEROBIC AND ANAEROBIC Blood Culture adequate volume   Culture   Final    NO GROWTH 5 DAYS Performed at [facility], [address] [place], [place] [address]    Report Status 09/04/2018 FINAL  Final  MRSA PCR Screening     Status: None   Collection Time: 08/30/18  5:33  PM  Result Value Ref Range Status   MRSA by PCR NEGATIVE NEGATIVE Final    Comment:        The GeneXpert MRSA Assay (FDA approved for NASAL specimens only), is one component of a comprehensive MRSA colonization surveillance program. It is not intended to diagnose MRSA infection nor to guide or monitor treatment for MRSA infections. Performed at [facility], [address] [place], [place] [address]   Respiratory Panel by PCR     Status: None   Collection Time: 08/31/18  7:32 AM  Result Value Ref Range Status   Adenovirus NOT DETECTED NOT DETECTED Final   Coronavirus 229E NOT DETECTED NOT DETECTED Final    Comment: (NOTE) The Coronavirus on the Respiratory Panel, DOES NOT test for the novel  Coronavirus (2019 nCoV)    Coronavirus HKU1 NOT DETECTED NOT DETECTED Final   Coronavirus NL63 NOT DETECTED NOT DETECTED Final   Coronavirus OC43 NOT DETECTED NOT DETECTED Final   Metapneumovirus NOT DETECTED NOT DETECTED Final   Rhinovirus / Enterovirus NOT DETECTED NOT DETECTED Final   Influenza A NOT DETECTED NOT DETECTED Final   Influenza B NOT DETECTED NOT DETECTED Final   Parainfluenza Virus 1 NOT DETECTED NOT DETECTED Final   Parainfluenza Virus 2 NOT DETECTED NOT DETECTED Final   Parainfluenza Virus 3 NOT DETECTED NOT DETECTED Final   Parainfluenza Virus 4 NOT DETECTED NOT DETECTED Final   Respiratory Syncytial Virus NOT DETECTED NOT DETECTED Final   Bordetella pertussis NOT DETECTED NOT DETECTED Final   Chlamydophila pneumoniae NOT DETECTED NOT  DETECTED Final   Mycoplasma pneumoniae NOT DETECTED NOT DETECTED Final    Comment: Performed at [facility], [place] [address] [place], [place] [address]         Radiology Studies: No results found.    Scheduled Meds: . atorvastatin  10 mg Oral QHS  . [START ON 09/06/2018] azithromycin  250 mg Oral Daily  . budesonide (PULMICORT) nebulizer solution  0.25 mg Nebulization BID  . Chlorhexidine Gluconate Cloth  6 each Topical Daily  . chlorpheniramine-HYDROcodone  5 mL Oral Q12H  . diclofenac sodium  4 g Topical QID  . enoxaparin (LOVENOX) injection  40 mg Subcutaneous Q24H  . escitalopram  10 mg Oral Daily  . folic acid  1 mg Oral Daily  . furosemide  40 mg Intravenous BID  . ipratropium-albuterol  3 mL Nebulization TID  . lisinopril  10 mg Oral Daily  . methylPREDNISolone (SOLU-MEDROL) injection  40 mg Intravenous Q12H   Continuous Infusions: . sodium chloride Stopped (09/01/18 1852)     LOS: 4 days      Debbe Odea, MD [facility] Pager: [email or web address] Password [facility] 09/05/2018, 3:23 PM

## 2018-09-06 DIAGNOSIS — C61 Malignant neoplasm of prostate: Secondary | ICD-10-CM

## 2018-09-06 DIAGNOSIS — I5033 Acute on chronic diastolic (congestive) heart failure: Secondary | ICD-10-CM

## 2018-09-06 LAB — PSA: Prostatic Specific Antigen: 29.69 ng/mL — ABNORMAL HIGH (ref 0.00–4.00)

## 2018-09-06 MED ORDER — HYDROCODONE-ACETAMINOPHEN 5-325 MG PO TABS
1.0000 | ORAL_TABLET | ORAL | Status: DC | PRN
  Administered 2018-09-06: 14:00:00 1 via ORAL
  Filled 2018-09-06: qty 1

## 2018-09-06 MED ORDER — PREDNISONE 10 MG PO TABS: ORAL_TABLET | ORAL | 0 refills | Status: DC

## 2018-09-06 MED ORDER — HYDROCOD POLST-CPM POLST ER 10-8 MG/5ML PO SUER: 5.0000 mL | Freq: Two times a day (BID) | ORAL | 0 refills | Status: DC | PRN

## 2018-09-06 MED ORDER — AZITHROMYCIN 250 MG PO TABS: 250.0000 mg | ORAL_TABLET | Freq: Every day | ORAL | 0 refills | Status: DC

## 2018-09-06 MED ORDER — DICLOFENAC SODIUM 1 % TD GEL: 4.0000 g | Freq: Four times a day (QID) | TRANSDERMAL | 0 refills | Status: DC

## 2018-09-06 NOTE — Discharge Instructions (Signed)
Acute Respiratory Distress Syndrome, Adult ° °Acute respiratory distress syndrome is a life-threatening condition in which fluid collects in the lungs. This prevents the lungs from filling with air and passing oxygen into the blood. This can cause the lungs and other vital organs to fail. The condition usually develops following an infection, illness, surgery, or injury. °What are the causes? °This condition may be caused by: °· An infection, such as sepsis or pneumonia. °· A serious injury to the head or chest. °· Severe bleeding from an injury. °· A major surgery. °· Breathing in harmful chemicals or smoke. °· Blood transfusions. °· A blood clot in the lungs. °· Breathing in vomit (aspiration). °· Near-drowning. °· Inflammation of the pancreas (pancreatitis). °· A drug overdose. °What are the signs or symptoms? °Sudden shortness of breath and rapid breathing are the main symptoms of this condition. Other symptoms may include: °· A fast or irregular heartbeat. °· Skin, lips, or fingernails that look blue (cyanosis). °· Confusion. °· Tiredness or loss of energy. °· Chest pain, particularly while taking a breath. °· Coughing. °· Restlessness or anxiety. °· Fever. This is usually present if there is an underlying infection, such as pneumonia. °How is this diagnosed? °This condition is diagnosed based on: °· Your symptoms. °· Medical history. °· A physical exam. During the exam, your health care provider will listen to your heart and check for crackling or wheezing sounds in your lungs. °You may also have other tests to confirm the diagnosis and measure how well your lungs are working. These may include: °· Measuring the amount of oxygen in your blood. Your health care provider will use two methods to do this procedure: °? A small device (pulse oximeter) that is placed on your finger, earlobe, or toe. °? An arterial blood gas test. A sample of blood is taken from an artery and tested for oxygen levels. °· Blood  tests. °· Chest X-rays or CT scans to look for fluid in the lungs. °· Taking a sample of your sputum to test for infection. °· Heart test, such as an echocardiogram or electrocardiogram. This is done to rule out any heart problems (such as heart failure) that may be causing your symptoms. °· Bronchoscopy. During this test, a thin, flexible tube with a light is passed into the mouth or nose, down the windpipe, and into the lungs. °How is this treated? °Treatment depends on the cause of your condition. The goal is to support you while your lungs heal and the underlying cause is treated. Treatment may include: °· Oxygen therapy. This may be done through: °? A tube in your nose or a face mask. °? A ventilator. This device helps move air into and out of your lungs through a breathing tube that is inserted into your mouth or nose. °· Continuous positive airway pressure (CPAP). This treatment uses mild air pressure to keep the airways open. A mask or other device will be placed over your nose or mouth. °· Tracheostomy. During this procedure, a small cut is made in your neck to create an opening to your windpipe. A breathing tube is placed directly into your windpipe. The breathing tube is connected to a ventilator. This is done if you have problems with your airway or if you need a ventilator for a long period of time. °· Positioning you to lie on your stomach (prone position). °· Medicines, such as: °? Sedatives to help you relax. °? Blood pressure medicines. °? Antibiotics to treat infection. °? Blood   thinners to prevent blood clots. °? Diuretics to help prevent excess fluid. °· Fluids and nutrients given through an IV tube. °· Wearing compression stockings on your legs to prevent blood clots. °· Extra corporeal membrane oxygenation (ECMO). This treatment takes blood outside your body, adds oxygen, and removes carbon dioxide. The blood is then returned to your body. This treatment is only used in severe cases. °Follow  these instructions at home: °· Take over-the-counter and prescription medicines only as told by your health care provider. °· Do not use any products that contain nicotine or tobacco, such as cigarettes and e-cigarettes. If you need help quitting, ask your health care provider. °· Limit alcohol intake to no more than 1 drink per day for nonpregnant women and 2 drinks per day for men. One drink equals 12 oz of beer, 5 oz of wine, or 1½ oz of hard liquor. °· Ask friends and family to help you if daily activities make you tired. °· Attend any pulmonary rehabilitation as told by your health care provider. This may include: °? Education about your condition. °? Exercises. °? Breathing training. °? Counseling. °? Learning techniques to conserve energy. °? Nutrition counseling. °· Keep all follow-up visits as told by your health care provider. This is important. °Contact a health care provider if: °· You become short of breath during activity or while resting. °· You develop a cough that does not go away. °· You have a fever. °· Your symptoms do not get better or they get worse. °· You become anxious or depressed. °Get help right away if: °· You have sudden shortness of breath. °· You develop sudden chest pain that does not go away. °· You develop a rapid heart rate. °· You develop swelling or pain in one of your legs. °· You cough up blood. °· You have trouble breathing. °· Your skin, lips, or fingernails turn blue. °These symptoms may represent a serious problem that is an emergency. Do not wait to see if the symptoms will go away. Get medical help right away. Call your local emergency services (911 in the [place] Do not drive yourself to the hospital. °Summary °· Acute respiratory distress syndrome is a life-threatening condition in which fluid collects in the lungs, which leads the lungs and other vital organs to fail. °· This condition usually develops following an infection, illness, surgery, or injury. °· Sudden  shortness of breath and rapid breathing are the main symptoms of acute respiratory distress syndrome. °· Treatment may include oxygen therapy, continuous positive airway pressure (CPAP), tracheostomy, lying on your stomach (prone position), medicines, fluids and nutrients given through an IV tube, compression stockings, and extra corporeal membrane oxygenation (ECMO). °This information is not intended to replace advice given to you by your health care provider. Make sure you discuss any questions you have with your health care provider. °Document Released: 04/11/2005 Document Revised: 03/28/2016 Document Reviewed: 03/28/2016 °Elsevier Interactive Patient Education © 2019 [employer] ° °

## 2018-09-06 NOTE — Plan of Care (Signed)
Problem: Education: Goal: Knowledge of General Education information will improve Description Including pain rating scale, medication(s)/side effects and non-pharmacologic comfort measures 09/06/2018 1246 by Rance Muir, RN Outcome: Adequate for Discharge 09/06/2018 1134 by Rance Muir, RN Outcome: Progressing   Problem: Health Behavior/Discharge Planning: Goal: Ability to manage health-related needs will improve 09/06/2018 1246 by Rance Muir, RN Outcome: Adequate for Discharge 09/06/2018 1134 by Rance Muir, RN Outcome: Progressing   Problem: Clinical Measurements: Goal: Ability to maintain clinical measurements within normal limits will improve 09/06/2018 1246 by Rance Muir, RN Outcome: Adequate for Discharge 09/06/2018 1134 by Rance Muir, RN Outcome: Progressing Goal: Will remain free from infection 09/06/2018 1246 by Rance Muir, RN Outcome: Adequate for Discharge 09/06/2018 1134 by Rance Muir, RN Outcome: Progressing Goal: Diagnostic test results will improve 09/06/2018 1246 by Rance Muir, RN Outcome: Adequate for Discharge 09/06/2018 1134 by Rance Muir, RN Outcome: Progressing Goal: Respiratory complications will improve 09/06/2018 1246 by Rance Muir, RN Outcome: Adequate for Discharge 09/06/2018 1134 by Rance Muir, RN Outcome: Progressing Goal: Cardiovascular complication will be avoided 09/06/2018 1246 by Rance Muir, RN Outcome: Adequate for Discharge 09/06/2018 1134 by Rance Muir, RN Outcome: Progressing   Problem: Activity: Goal: Risk for activity intolerance will decrease 09/06/2018 1246 by Rance Muir, RN Outcome: Adequate for Discharge 09/06/2018 1134 by Rance Muir, RN Outcome: Progressing   Problem: Nutrition: Goal: Adequate nutrition will be maintained 09/06/2018 1246 by Rance Muir, RN Outcome: Adequate for Discharge 09/06/2018 1134 by Rance Muir, RN Outcome: Progressing   Problem: Coping: Goal: Level of anxiety will decrease 09/06/2018 1246 by Rance Muir, RN Outcome: Adequate for  Discharge 09/06/2018 1134 by Rance Muir, RN Outcome: Progressing   Problem: Elimination: Goal: Will not experience complications related to bowel motility 09/06/2018 1246 by Rance Muir, RN Outcome: Adequate for Discharge 09/06/2018 1134 by Rance Muir, RN Outcome: Progressing Goal: Will not experience complications related to urinary retention 09/06/2018 1246 by Rance Muir, RN Outcome: Adequate for Discharge 09/06/2018 1134 by Rance Muir, RN Outcome: Progressing   Problem: Pain Managment: Goal: General experience of comfort will improve 09/06/2018 1246 by Rance Muir, RN Outcome: Adequate for Discharge 09/06/2018 1134 by Rance Muir, RN Outcome: Progressing   Problem: Safety: Goal: Ability to remain free from injury will improve 09/06/2018 1246 by Rance Muir, RN Outcome: Adequate for Discharge 09/06/2018 1134 by Rance Muir, RN Outcome: Progressing   Problem: Skin Integrity: Goal: Risk for impaired skin integrity will decrease 09/06/2018 1246 by Rance Muir, RN Outcome: Adequate for Discharge 09/06/2018 1134 by Rance Muir, RN Outcome: Progressing   Problem: Education: Goal: Knowledge of disease or condition will improve 09/06/2018 1246 by Rance Muir, RN Outcome: Adequate for Discharge 09/06/2018 1134 by Rance Muir, RN Outcome: Progressing Goal: Knowledge of the prescribed therapeutic regimen will improve 09/06/2018 1246 by Rance Muir, RN Outcome: Adequate for Discharge 09/06/2018 1134 by Rance Muir, RN Outcome: Progressing Goal: Individualized Educational Video(s) 09/06/2018 1246 by Rance Muir, RN Outcome: Adequate for Discharge 09/06/2018 1134 by Rance Muir, RN Outcome: Progressing   Problem: Activity: Goal: Ability to tolerate increased activity will improve 09/06/2018 1246 by Rance Muir, RN Outcome: Adequate for Discharge 09/06/2018 1134 by Rance Muir, RN Outcome: Progressing Goal: Will verbalize the importance of balancing activity with adequate rest periods 09/06/2018 1246 by Rance Muir,  RN Outcome: Adequate for Discharge 09/06/2018 1134 by Rance Muir, RN Outcome: Progressing   Problem: Respiratory: Goal: Ability to maintain a clear airway will improve 09/06/2018 1246 by Rance Muir, RN Outcome: Adequate for Discharge 09/06/2018  1134 by Rance Muir, RN Outcome: Progressing Goal: Levels of oxygenation will improve 09/06/2018 1246 by Rance Muir, RN Outcome: Adequate for Discharge 09/06/2018 1134 by Rance Muir, RN Outcome: Progressing Goal: Ability to maintain adequate ventilation will improve 09/06/2018 1246 by Rance Muir, RN Outcome: Adequate for Discharge 09/06/2018 1134 by Rance Muir, RN Outcome: Progressing

## 2018-09-06 NOTE — TOC Transition Note (Addendum)
Transition of Care [facility]) - CM/SW Discharge Note   Patient Details  Name: Joel Valdez MRN: [id number] Date of Birth: 11/07/1945  Transition of Care [facility]) CM/SW Contact:  Sherald Barge, RN Phone Number: 09/06/2018, 12:27 PM   Clinical Narrative:   Pt has no decided he wants to go home with [facility]. Pt did better with PT today. Blake Divine, [place] has delivered port tank to pt room for transport home. CM has requested [employer] rep to contact pt's wife to discuss concerns about home O2. CM discussed with pt concern about his multiple readmissions. Pt acknowledges he has been in the hospital a lot. He says he doesn't want to but "will come back if he has to".    Update: CM called wife back because she had mentioned to Palliative NP that they wanted to switch DME provider to [facility]. [employer] rep has spoken with wife and they are hopeful to resolve the issues. (Per wife issues are: delivery man was rude, they do not have 24hour supply of O2 if power were to go out).    Final next level of care: [place] Barriers to Discharge: No Barriers Identified   Patient Goals and CMS Choice Patient states their goals for this hospitalization and ongoing recovery are:: not to have to come back to the hospital CMS Medicare.gov Compare Post Acute Care list provided to:: Patient Represenative (must comment)(wife) Choice offered to / list presented to : Spouse  Discharge Plan and Services In-house Referral: NA Discharge Planning Services: CM Consult, Follow-up appt scheduled               HH Arranged: PT, OT HH Agency: [facility] (formerly [employer]) Date [place]: 09/05/18 Time [place]: [id number] Representative spoke with at [place]: [place]   Readmission Risk Interventions Readmission Risk Prevention Plan 09/05/2018 08/31/2018  Transportation Screening - Complete  PCP or Specialist Appt within 3-5 Days - Complete  HRI  or [place]s - Complete  Social Work Consult for [place] Planning/Counseling - Complete  Palliative Care Screening - Not Applicable  Medication Review Press photographer) - Complete  PCP or Specialist appointment within 3-5 days of discharge Complete -  [place] or [place] Complete -  SW Recovery Care/Counseling Consult Complete -  Palliative Care Screening Not Applicable -  [place] Not Applicable -  Some recent data might be hidden

## 2018-09-06 NOTE — Care Management (Signed)
Pt agreeable this AM to SNF bed search. Referrals sent to 5 SNF's in [facility].

## 2018-09-06 NOTE — Discharge Summary (Signed)
Physician Discharge Summary  Joel Valdez PXT:[id number] DOB: 09-16-1945 DOA: 08/30/2018  PCP: [name], PA-C  Admit date: 08/30/2018 Discharge date: 09/06/2018  Admitted From: home  Disposition:  home   Recommendations for Outpatient Follow-up:  1. F/u with pulmonary in 1 wk  Home Health:  ordered Discharge Condition:  stable   CODE STATUS:  No CPR, cardioversion, shock or ACLS medications   Consultations:  pulmonary    Discharge Diagnoses:  Principal Problem:   Acute on chronic respiratory failure ([place]) Active Problems:   Rheumatoid arthritis involving both hands ([place])   Interstitial pulmonary fibrosis ([place])      Brief Summary: Joel Almond. [place] is a 73 y/o male with Rh arthritis and pulmonary fibrosis, prostate cancer, HTN and GERD. He was admitted for dyspnea and cough.  He was admitted in early April (07/30/18- 08/15/18) for b/l pneumonia to [facility] and was intubated. After that hospital stay, he required continuous O2 . He meat with Dr Melvyn Novas who evaluated him in the office and recommended he continue O2. He was on 3-4 L at baseline. He was readmitted on 4/29 through 4/30 for chest pain. CTA was negative and he was discharged home. He returned on 09/04/18 for ongoing dyspnea and severe cough.   CTA > Redemonstration of advanced interstitial fibrosis, with likely superimposed acute bronchitis/atypical infection given that there is slight increased prominence of the interstitial opacities, enlarging right pleural effusion, and new left pleural effusion. He was started on Diuretics and Solumedrol.   Hospital Course:  Acute on chronic respiratory failure- Interstitial fibrosis - due to severe underlying pulmonary fibrosis, and  Recent pneumonia (likely with new scarring) and pleural effusions - he has been treated with IV diuretics, steroids and antibiotics - started incentive spirometry, Tussionex and Mucinex DM for cough - controlled left sided chest pain (musculoskeletal)  with Norco and Voltaren gel  -I have had a long discussion with the patient and his wife- according to his wife, he is not able to walk much anymore without getting severely short of breath - as he is requiring 4-6 L of O2 and has severe fibrosis, I have involved palliative care for discussions in case he does not improve - the patient has decided that he WOULD want short term intubation if he did need it again - at this point he and his family are declining palliative care services - will d/c home with a prolonged prednisone taper and he will finish off his antibiotics - he will follow up with pulmonary in 1 wk.      Active Problems: Acute on chronic diastolic CHF - 2 d ECHO from 08/23/18 is suggestive of diastolic CHF- he was given IV Lasix during his hospital stay and can continue Lasix at home    Rheumatoid arthritis involving both hands - cont outpt management  Prostate Cancer  - on Lupron injections every 3 months    Discharge Exam: Vitals:   09/06/18 0501 09/06/18 1055  BP: 122/77   Pulse: 82 98  Resp: 18   Temp: 97.8 F (36.6 C)   SpO2: 100% 97%   Vitals:   09/05/18 2107 09/06/18 0425 09/06/18 0501 09/06/18 1055  BP: 112/83  122/77   Pulse: 96  82 98  Resp: 18  18   Temp: 98 F (36.7 C)  97.8 F (36.6 C)   TempSrc: Oral  Oral   SpO2: 100%  100% 97%  Weight:  69.4 kg    Height:  General: Pt is alert, awake, not in acute distress Cardiovascular: RRR, S1/S2 +, no rubs, no gallops Respiratory: Crackles bilaterally, no wheezing, no rhonchi Abdominal: Soft, NT, ND, bowel sounds + Extremities: no edema, no cyanosis   Discharge Instructions  Discharge Instructions    DME Nebulizer machine   Complete by:  As directed    Patient needs a nebulizer to treat with the following condition:  Interstitial lung disease (HCC)   Diet - low sodium heart healthy   Complete by:  As directed    Diet - low sodium heart healthy   Complete by:  As directed     Increase activity slowly   Complete by:  As directed    Increase activity slowly   Complete by:  As directed      Allergies as of 09/06/2018      Reactions   Methotrexate Derivatives Diarrhea, Other (See Comments)   Weakness; Severe bone pain; No Appetite Methotrexate with preservatives      Medication List    TAKE these medications   albuterol 108 (90 Base) MCG/ACT inhaler Commonly known as:  VENTOLIN HFA Inhale 2 puffs into the lungs every 6 (six) hours as needed for wheezing or shortness of breath.   amLODipine 5 MG tablet Commonly known as:  NORVASC Take 5 mg by mouth daily.   atorvastatin 10 MG tablet Commonly known as:  LIPITOR Take 1 tablet (10 mg total) by mouth at bedtime.   azithromycin 250 MG tablet Commonly known as:  ZITHROMAX Take 1 tablet (250 mg total) by mouth daily.   B-D TB SYRINGE 1CC/26GX3/8" 26G X 3/8" 1 ML Misc Generic drug:  TUBERCULIN SYR 1CC/26GX3/8" USE TO INJECT methotrexate ONCE every WEEK AS DIRECTED   chlorpheniramine-HYDROcodone 10-8 MG/5ML Suer Commonly known as:  TUSSIONEX Take 5 mLs by mouth every 12 (twelve) hours as needed for cough.   diclofenac sodium 1 % Gel Commonly known as:  VOLTAREN Apply 4 g topically 4 (four) times daily.   escitalopram 20 MG tablet Commonly known as:  LEXAPRO Take 0.5 tablets (10 mg total) by mouth daily.   folic acid 1 MG tablet Commonly known as:  FOLVITE Take 1 tablet by mouth daily.   furosemide 20 MG tablet Commonly known as:  LASIX Take 20 mg by mouth daily.   guaiFENesin 600 MG 12 hr tablet Commonly known as:  MUCINEX Take 2 tablets (1,200 mg total) by mouth 2 (two) times daily as needed.   HYDROcodone-acetaminophen 5-325 MG tablet Commonly known as:  NORCO/VICODIN Take 1 tablet by mouth every 6 (six) hours as needed for moderate pain or severe pain.   ipratropium-albuterol 0.5-2.5 (3) MG/3ML Soln Commonly known as:  DUONEB Take 3 mLs by nebulization every 6 (six) hours as needed  for up to 30 days (shortness of breath or wheezing).   leuprolide 30 MG injection Commonly known as:  LUPRON Inject 30 mg into the muscle every 3 (three) months.   lisinopril 10 MG tablet Commonly known as:  ZESTRIL Take 10 mg by mouth daily.   multivitamin with minerals Tabs tablet Take 1 tablet by mouth daily.   OLIVE LEAF PO Take 1 tablet by mouth daily.   OXYGEN 4 lpm 24/7  unsure of DME   predniSONE 20 MG tablet Commonly known as:  Deltasone Take 2 tablets (40 mg total) by mouth daily for 5 days. What changed:    medication strength  how much to take  when to take this  Another medication with  the same name was removed. Continue taking this medication, and follow the directions you see here.   VITAMIN B-COMPLEX PO Take 1 tablet by mouth daily.   Vitamin D3 125 MCG (5000 UT) Tabs Take 1-2 tablets by mouth daily.            Durable Medical Equipment  (From admission, onward)         Start     Ordered   09/04/18 0000  DME Nebulizer machine    Question:  Patient needs a nebulizer to treat with the following condition  Answer:  Interstitial lung disease ([place])   09/04/18 1225         Follow-up Information    [name], PA-C Follow up on 09/07/2018.   Specialty:  Family Medicine Why:  Friday at 11:00 with Elyn Aquas.  This is an in person appointment at the office. Contact information: [address] [place] [address] [phone]        Sinda Du, MD Follow up in 1 week(s).   Specialty:  Pulmonary Disease Contact information: [address] [place] [address] [phone]          Allergies  Allergen Reactions  . Methotrexate Derivatives Diarrhea and Other (See Comments)    Weakness; Severe bone pain; No Appetite Methotrexate with preservatives     Procedures/Studies:    Dg Chest 2 View  Result Date: 08/29/2018 CLINICAL DATA:  Dyspnea on exertion. EXAM: CHEST - 2 VIEW COMPARISON:  08/21/2018 and CT  08/22/2018 FINDINGS: Lungs are hypoinflated demonstrate bilateral coarse interstitial disease compatible with known fibrosis. No new airspace process or effusion. Cardiomediastinal silhouette and remainder of the exam is unchanged. IMPRESSION: No acute findings. Stable bilateral interstitial disease. Electronically Signed   By: Marin Olp M.D.   On: 08/29/2018 15:30   Dg Chest 2 View  Result Date: 08/21/2018 CLINICAL DATA:  Chest pain. EXAM: CHEST - 2 VIEW COMPARISON:  CT scan of August 09, 2018. Radiograph of August 06, 2018. FINDINGS: Stable cardiomediastinal silhouette. Atherosclerosis of thoracic aorta is noted. No pneumothorax or pleural effusion is noted. Stable coarse reticular interstitial densities are noted throughout both lungs most consistent with scarring or fibrosis. No acute abnormality is noted. Bony thorax is unremarkable. IMPRESSION: Stable coarse reticular densities are noted throughout both lungs most consistent with scarring or fibrosis. No acute abnormality is noted. Aortic Atherosclerosis (ICD10-I70.0). Electronically Signed   By: Marijo Conception M.D.   On: 08/21/2018 15:12   Ct Angio Chest Pe W And/or Wo Contrast  Result Date: 08/30/2018 CLINICAL DATA:  73 year old male with shortness of breath EXAM: CT ANGIOGRAPHY CHEST WITH CONTRAST TECHNIQUE: Multidetector CT imaging of the chest was performed using the standard protocol during bolus administration of intravenous contrast. Multiplanar CT image reconstructions and MIPs were obtained to evaluate the vascular anatomy. CONTRAST:  112mL OMNIPAQUE IOHEXOL 350 MG/ML SOLN COMPARISON:  CT 08/22/2018, 08/09/2018 FINDINGS: Cardiovascular: Heart: Similar appearance of the heart size with cardiomegaly. No pericardial fluid/thickening . Calcifications of left main, left anterior descending, right coronary arteries. Aorta: Unremarkable course, caliber, contour of the thoracic aorta. No aneurysm or dissection flap. No periaortic fluid.  Calcifications of the aortic arch. Pulmonary arteries: No central, lobar, segmental, or proximal subsegmental filling defects. Main pulmonary diameter measures 3.0 cm Mediastinum/Nodes: No mediastinal adenopathy. Unremarkable appearance of the thoracic esophagus. Unremarkable thoracic inlet Lungs/Pleura: Redemonstration of background of centrilobular and paraseptal emphysema with architectural distortion and a pattern of subpleural reticulation/honeycomb pattern of the  bilateral lungs, predominance of the lower lobes. Interlobular septal thickening. New trace left pleural fluid compared to the prior CT. Slight increased right pleural fluid at the fissure compared to the prior CT. No endotracheal or endobronchial debris. Bronchial wall thickening is similar to prior. Overall, there is slight increased interstitial opacities. Upper Abdomen: No acute. Musculoskeletal: No acute displaced fracture. Degenerative changes of the spine. Review of the MIP images confirms the above findings. IMPRESSION: Redemonstration of advanced interstitial fibrosis, with likely superimposed acute bronchitis/atypical infection given that there is slight increased prominence of the interstitial opacities, enlarging right pleural effusion, and new left pleural effusion. No evidence of overt edema. Aortic atherosclerosis and coronary artery disease. Aortic Atherosclerosis (ICD10-I70.0). Electronically Signed   By: Corrie Mckusick D.O.   On: 08/30/2018 15:31   Ct Angio Chest Pe W And/or Wo Contrast  Result Date: 08/22/2018 CLINICAL DATA:  Chest pain and shortness of breath. Elevated D-dimer. High clinical probability for pulmonary embolism. EXAM: CT ANGIOGRAPHY CHEST WITH CONTRAST TECHNIQUE: Multidetector CT imaging of the chest was performed using the standard protocol during bolus administration of intravenous contrast. Multiplanar CT image reconstructions and MIPs were obtained to evaluate the vascular anatomy. CONTRAST:  155mL OMNIPAQUE  IOHEXOL 350 MG/ML SOLN COMPARISON:  08/09/2018 from [place]: Cardiovascular: Satisfactory opacification of pulmonary arteries noted, and no pulmonary emboli identified. No evidence of thoracic aortic dissection or aneurysm. Mild cardiomegaly. Aortic and coronary artery atherosclerosis. Mediastinum/Nodes: No masses or pathologically enlarged lymph nodes identified. Lungs/Pleura: Severe chronic pulmonary interstitial fibrosis again seen with honeycombing and traction bronchiectasis which is most severe in the left upper lobe. Previously seen areas of parenchymal consolidation in the right mid and lower lung are decreased since previous study, consistent with resolving superimposed infectious or inflammatory process. No new or worsening areas of pulmonary opacity are identified. No evidence of pneumothorax or pleural effusion. Upper abdomen: No acute findings. Musculoskeletal: No suspicious bone lesions identified. Review of the MIP images confirms the above findings. IMPRESSION: 1. No evidence of pulmonary embolism or other acute findings. 2. Severe chronic pulmonary interstitial fibrosis. 3. Resolving right mid and lower lung airspace opacity, consistent with resolving superimposed infectious or inflammatory process. 4. Aortic and coronary artery atherosclerosis. Electronically Signed   By: Earle Gell M.D.   On: 08/22/2018 20:20   Ct Angio Chest Pe W Or Wo Contrast  Result Date: 08/09/2018 CLINICAL DATA:  Persistent hypoxia, negative COVID EXAM: CT ANGIOGRAPHY CHEST WITH CONTRAST TECHNIQUE: Multidetector CT imaging of the chest was performed using the standard protocol during bolus administration of intravenous contrast. Multiplanar CT image reconstructions and MIPs were obtained to evaluate the vascular anatomy. CONTRAST:  142mL OMNIPAQUE IOHEXOL 350 MG/ML SOLN COMPARISON:  Chest radiographs, 08/06/2018 FINDINGS: Cardiovascular: Satisfactory opacification of the pulmonary arteries to the  segmental level. No evidence of pulmonary embolism. Normal heart size. Scattered coronary artery calcifications. Aortic atherosclerosis. No pericardial effusion. Mediastinum/Nodes: No enlarged mediastinal, hilar, or axillary lymph nodes. Thyroid gland, trachea, and esophagus demonstrate no significant findings. Lungs/Pleura: Moderate to severe pulmonary fibrosis in a pattern featuring mild traction bronchiectasis and multiple areas of bronchiolectasis and possible honeycombing, for example in left upper lobe. There is no significant apical to basal gradient. Moderate underlying emphysema. There are areas of ground-glass and consolidative airspace opacity in the right greater than left lung bases. There are areas of dense consolidation about the fissures (e.g. series 6, image 72). No pleural effusion or pneumothorax. Upper Abdomen: No acute abnormality. Musculoskeletal: No chest wall abnormality.  No acute or significant osseous findings. Review of the MIP images confirms the above findings. IMPRESSION: 1.  Negative examination for pulmonary embolism. 2. Moderate to severe pulmonary fibrosis in a pattern featuring mild traction bronchiectasis and multiple areas of bronchiolectasis and possible honeycombing, for example in left upper lobe. There is no significant apical to basal gradient. Moderate underlying emphysema. 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 There are areas of dense consolidation about the fissures (e.g. series 6, image 72), which are likely related to chronic scarring. Recommend follow-up CT for these areas in 3 months to exclude underlying mass. 3. There are areas of ground-glass and consolidative airspace opacity in the right greater than left lung bases, consistent with multifocal infection. 4.  Coronary artery disease and aortic atherosclerosis. Electronically Signed   By: Eddie Candle M.D.   On: 08/09/2018 10:56   US Venous Img Lower  Unilateral Right  Result Date: 08/31/2018 CLINICAL DATA:  Right lower extremity edema. History of prostate cancer. Evaluate for DVT. EXAM: RIGHT LOWER EXTREMITY VENOUS DOPPLER ULTRASOUND TECHNIQUE: Gray-scale sonography with graded compression, as well as color Doppler and duplex ultrasound were performed to evaluate the lower extremity deep venous systems from the level of the common femoral vein and including the common femoral, femoral, profunda femoral, popliteal and calf veins including the posterior tibial, peroneal and gastrocnemius veins when visible. The superficial great saphenous vein was also interrogated. Spectral Doppler was utilized to evaluate flow at rest and with distal augmentation maneuvers in the common femoral, femoral and popliteal veins. COMPARISON:  None. FINDINGS: Contralateral Common Femoral Vein: Respiratory phasicity is normal and symmetric with the symptomatic side. No evidence of thrombus. Normal compressibility. Common Femoral Vein: No evidence of thrombus. Normal compressibility, respiratory phasicity and response to augmentation. Saphenofemoral Junction: No evidence of thrombus. Normal compressibility and flow on color Doppler imaging. Profunda Femoral Vein: No evidence of thrombus. Normal compressibility and flow on color Doppler imaging. Femoral Vein: No evidence of thrombus. Normal compressibility, respiratory phasicity and response to augmentation. Popliteal Vein: No evidence of thrombus. Normal compressibility, respiratory phasicity and response to augmentation. Calf Veins: No evidence of thrombus. Normal compressibility and flow on color Doppler imaging. Superficial Great Saphenous Vein: No evidence of thrombus. Normal compressibility. Venous Reflux:  None. Other Findings: A minimal subcutaneous edema is noted at the level right lower leg and calf. IMPRESSION: No evidence DVT within the right lower extremity. Electronically Signed   By: Sandi Mariscal M.D.   On: 08/31/2018 09:19    Dg Chest Portable 1 View  Result Date: 08/30/2018 CLINICAL DATA:  Shortness of breath. History of smoking and pulmonary fibrosis. EXAM: PORTABLE CHEST 1 VIEW COMPARISON:  08/29/2018 FINDINGS: The cardiomediastinal silhouette is unchanged. Aortic atherosclerosis is noted. Lung volumes remain mildly low with chronic coarse interstitial opacities again noted throughout both lungs. No definite acute airspace opacity is identified allowing for portable AP technique today compared to two view upright imaging yesterday. No sizable pleural effusion or pneumothorax is identified. No acute osseous abnormality is seen. IMPRESSION: Chronic interstitial lung disease without evidence of acute abnormality. Electronically Signed   By: Logan Bores M.D.   On: 08/30/2018 12:24     The results of significant diagnostics from this hospitalization (including imaging, microbiology, ancillary and laboratory) are listed below for reference.     Microbiology: Recent Results (from the past 240 hour(s))  SARS Coronavirus 2 (CEPHEID- Performed in [place] hospital lab), [facility] [facility]  Status: None   Collection Time: 08/30/18 11:36 AM  Result Value Ref Range Status   SARS Coronavirus 2 NEGATIVE NEGATIVE Final    Comment: (NOTE) If result is NEGATIVE SARS-CoV-2 target nucleic acids are NOT DETECTED. The SARS-CoV-2 RNA is generally detectable in upper and lower  respiratory specimens during the acute phase of infection. The lowest  concentration of SARS-CoV-2 viral copies this assay can detect is 250  copies / mL. A negative result does not preclude SARS-CoV-2 infection  and should not be used as the sole basis for treatment or other  patient management decisions.  A negative result may occur with  improper specimen collection / handling, submission of specimen other  than nasopharyngeal swab, presence of viral mutation(s) within the  areas targeted by this assay, and inadequate number of viral copies  (<250 copies  / mL). A negative result must be combined with clinical  observations, patient history, and epidemiological information. If result is POSITIVE SARS-CoV-2 target nucleic acids are DETECTED. The SARS-CoV-2 RNA is generally detectable in upper and lower  respiratory specimens dur ing the acute phase of infection.  Positive  results are indicative of active infection with SARS-CoV-2.  Clinical  correlation with patient history and other diagnostic information is  necessary to determine patient infection status.  Positive results do  not rule out bacterial infection or co-infection with other viruses. If result is PRESUMPTIVE POSTIVE SARS-CoV-2 nucleic acids MAY BE PRESENT.   A presumptive positive result was obtained on the submitted specimen  and confirmed on repeat testing.  While 2019 novel coronavirus  (SARS-CoV-2) nucleic acids may be present in the submitted sample  additional confirmatory testing may be necessary for epidemiological  and / or clinical management purposes  to differentiate between  SARS-CoV-2 and other Sarbecovirus currently known to infect humans.  If clinically indicated additional testing with an alternate test  methodology [phone]) is advised. The SARS-CoV-2 RNA is generally  detectable in upper and lower respiratory sp ecimens during the acute  phase of infection. The expected result is Negative. Fact Sheet for Patients:  StrictlyIdeas.no Fact Sheet for Healthcare Providers: BankingDealers.co.za This test is not yet approved or cleared by the [place] FDA and has been authorized for detection and/or diagnosis of SARS-CoV-2 by FDA under an Emergency Use Authorization (EUA).  This EUA will remain in effect (meaning this test can be used) for the duration of the COVID-19 declaration under Section 564(b)(1) of the Act, 21 U.S.C. section 360bbb-3(b)(1), unless the authorization is terminated or revoked  sooner. Performed at [facility], [address], [place], [place] [address]   Blood culture (routine x 2)     Status: None   Collection Time: 08/30/18 12:32 PM  Result Value Ref Range Status   Specimen Description LEFT ANTECUBITAL  Final   Special Requests   Final    BOTTLES DRAWN AEROBIC AND ANAEROBIC Blood Culture adequate volume   Culture   Final    NO GROWTH 5 DAYS Performed at [facility], [address], [place], [place] [address]    Report Status 09/04/2018 FINAL  Final  Blood culture (routine x 2)     Status: None   Collection Time: 08/30/18 12:32 PM  Result Value Ref Range Status   Specimen Description BLOOD RIGHT FOREARM  Final   Special Requests   Final    BOTTLES DRAWN AEROBIC AND ANAEROBIC Blood Culture adequate volume   Culture   Final    NO GROWTH 5 DAYS Performed at  [facility], [address], [place], [place] [address]    Report Status 09/04/2018 FINAL  Final  MRSA PCR Screening     Status: None   Collection Time: 08/30/18  5:33 PM  Result Value Ref Range Status   MRSA by PCR NEGATIVE NEGATIVE Final    Comment:        The GeneXpert MRSA Assay (FDA approved for NASAL specimens only), is one component of a comprehensive MRSA colonization surveillance program. It is not intended to diagnose MRSA infection nor to guide or monitor treatment for MRSA infections. Performed at [facility], [address] [place], [place] [address]   Respiratory Panel by PCR     Status: None   Collection Time: 08/31/18  7:32 AM  Result Value Ref Range Status   Adenovirus NOT DETECTED NOT DETECTED Final   Coronavirus 229E NOT DETECTED NOT DETECTED Final    Comment: (NOTE) The Coronavirus on the Respiratory Panel, DOES NOT test for the novel  Coronavirus (2019 nCoV)    Coronavirus HKU1 NOT DETECTED NOT DETECTED Final   Coronavirus NL63 NOT DETECTED NOT DETECTED Final   Coronavirus OC43 NOT DETECTED NOT DETECTED Final   Metapneumovirus NOT DETECTED NOT DETECTED Final    Rhinovirus / Enterovirus NOT DETECTED NOT DETECTED Final   Influenza A NOT DETECTED NOT DETECTED Final   Influenza B NOT DETECTED NOT DETECTED Final   Parainfluenza Virus 1 NOT DETECTED NOT DETECTED Final   Parainfluenza Virus 2 NOT DETECTED NOT DETECTED Final   Parainfluenza Virus 3 NOT DETECTED NOT DETECTED Final   Parainfluenza Virus 4 NOT DETECTED NOT DETECTED Final   Respiratory Syncytial Virus NOT DETECTED NOT DETECTED Final   Bordetella pertussis NOT DETECTED NOT DETECTED Final   Chlamydophila pneumoniae NOT DETECTED NOT DETECTED Final   Mycoplasma pneumoniae NOT DETECTED NOT DETECTED Final    Comment: Performed at [facility], [place] [address], [place], [place] [address]     Labs: BNP (last 3 results) Recent Labs    07/30/18 1221 08/30/18 1232  BNP 94.0 81.0   Basic Metabolic Panel: Recent Labs  Lab 09/01/18 0439 09/02/18 0433 09/03/18 0514 09/04/18 0553  NA 140 140 136 138  K 2.9* 4.5 4.5 4.5  CL 102 103 100 102  CO2 27 29 26 29   GLUCOSE 158* 156* 135* 131*  BUN 19 21 26* 24*  CREATININE 0.95 0.85 0.98 1.03  CALCIUM 8.3* 8.3* 8.0* 8.0*  MG 2.0 2.0 2.0  --    Liver Function Tests: No results for input(s): AST, ALT, ALKPHOS, BILITOT, PROT, ALBUMIN in the last 168 hours. No results for input(s): LIPASE, AMYLASE in the last 168 hours. No results for input(s): AMMONIA in the last 168 hours. CBC: Recent Labs  Lab 09/01/18 0439 09/03/18 0514  WBC 13.2* 13.6*  HGB 9.1* 10.4*  HCT 28.1* 34.2*  MCV 88.6 89.8  PLT 230 319   Cardiac Enzymes: Recent Labs  Lab 08/30/18 1818 08/31/18 0019  TROPONINI 0.05* 0.04*   BNP: Invalid input(s): POCBNP CBG: No results for input(s): GLUCAP in the last 168 hours. D-Dimer No results for input(s): DDIMER in the last 72 hours. Hgb A1c No results for input(s): HGBA1C in the last 72 hours. Lipid Profile No results for input(s): CHOL, HDL, LDLCALC, TRIG, CHOLHDL, LDLDIRECT in the last 72 hours. Thyroid function  studies No results for input(s): TSH, T4TOTAL, T3FREE, THYROIDAB in the last 72 hours.  Invalid input(s): FREET3 Anemia work up No results for input(s): VITAMINB12, FOLATE,  FERRITIN, TIBC, IRON, RETICCTPCT in the last 72 hours. Urinalysis    Component Value Date/Time   COLORURINE YELLOW 07/30/2018 1224   APPEARANCEUR CLEAR 07/30/2018 1224   LABSPEC 1.015 07/30/2018 1224   PHURINE 5.0 07/30/2018 1224   GLUCOSEU NEGATIVE 07/30/2018 1224   GLUCOSEU NEGATIVE 11/04/2015 0948   HGBUR MODERATE (A) 07/30/2018 1224   BILIRUBINUR NEGATIVE 07/30/2018 1224   BILIRUBINUR NEG 09/24/2013 1412   KETONESUR NEGATIVE 07/30/2018 1224   PROTEINUR NEGATIVE 07/30/2018 1224   UROBILINOGEN 0.2 11/04/2015 0948   NITRITE NEGATIVE 07/30/2018 1224   LEUKOCYTESUR NEGATIVE 07/30/2018 1224   Sepsis Labs Invalid input(s): PROCALCITONIN,  WBC,  LACTICIDVEN Microbiology Recent Results (from the past 240 hour(s))  SARS Coronavirus 2 (CEPHEID- Performed in [place] hospital lab), Hosp Order     Status: None   Collection Time: 08/30/18 11:36 AM  Result Value Ref Range Status   SARS Coronavirus 2 NEGATIVE NEGATIVE Final    Comment: (NOTE) If result is NEGATIVE SARS-CoV-2 target nucleic acids are NOT DETECTED. The SARS-CoV-2 RNA is generally detectable in upper and lower  respiratory specimens during the acute phase of infection. The lowest  concentration of SARS-CoV-2 viral copies this assay can detect is 250  copies / mL. A negative result does not preclude SARS-CoV-2 infection  and should not be used as the sole basis for treatment or other  patient management decisions.  A negative result may occur with  improper specimen collection / handling, submission of specimen other  than nasopharyngeal swab, presence of viral mutation(s) within the  areas targeted by this assay, and inadequate number of viral copies  (<250 copies / mL). A negative result must be combined with clinical  observations, patient  history, and epidemiological information. If result is POSITIVE SARS-CoV-2 target nucleic acids are DETECTED. The SARS-CoV-2 RNA is generally detectable in upper and lower  respiratory specimens dur ing the acute phase of infection.  Positive  results are indicative of active infection with SARS-CoV-2.  Clinical  correlation with patient history and other diagnostic information is  necessary to determine patient infection status.  Positive results do  not rule out bacterial infection or co-infection with other viruses. If result is PRESUMPTIVE POSTIVE SARS-CoV-2 nucleic acids MAY BE PRESENT.   A presumptive positive result was obtained on the submitted specimen  and confirmed on repeat testing.  While 2019 novel coronavirus  (SARS-CoV-2) nucleic acids may be present in the submitted sample  additional confirmatory testing may be necessary for epidemiological  and / or clinical management purposes  to differentiate between  SARS-CoV-2 and other Sarbecovirus currently known to infect humans.  If clinically indicated additional testing with an alternate test  methodology [phone]) is advised. The SARS-CoV-2 RNA is generally  detectable in upper and lower respiratory sp ecimens during the acute  phase of infection. The expected result is Negative. Fact Sheet for Patients:  StrictlyIdeas.no Fact Sheet for Healthcare Providers: BankingDealers.co.za This test is not yet approved or cleared by the [place] FDA and has been authorized for detection and/or diagnosis of SARS-CoV-2 by FDA under an Emergency Use Authorization (EUA).  This EUA will remain in effect (meaning this test can be used) for the duration of the COVID-19 declaration under Section 564(b)(1) of the Act, 21 U.S.C. section 360bbb-3(b)(1), unless the authorization is terminated or revoked sooner. Performed at [facility], [address], [place], [place] [address]   Blood  culture (routine x 2)     Status: None   Collection Time:  08/30/18 12:32 PM  Result Value Ref Range Status   Specimen Description LEFT ANTECUBITAL  Final   Special Requests   Final    BOTTLES DRAWN AEROBIC AND ANAEROBIC Blood Culture adequate volume   Culture   Final    NO GROWTH 5 DAYS Performed at [facility], [address], [place], [place] [address]    Report Status 09/04/2018 FINAL  Final  Blood culture (routine x 2)     Status: None   Collection Time: 08/30/18 12:32 PM  Result Value Ref Range Status   Specimen Description BLOOD RIGHT FOREARM  Final   Special Requests   Final    BOTTLES DRAWN AEROBIC AND ANAEROBIC Blood Culture adequate volume   Culture   Final    NO GROWTH 5 DAYS Performed at [facility], [address], [place], [place] [address]    Report Status 09/04/2018 FINAL  Final  MRSA PCR Screening     Status: None   Collection Time: 08/30/18  5:33 PM  Result Value Ref Range Status   MRSA by PCR NEGATIVE NEGATIVE Final    Comment:        The GeneXpert MRSA Assay (FDA approved for NASAL specimens only), is one component of a comprehensive MRSA colonization surveillance program. It is not intended to diagnose MRSA infection nor to guide or monitor treatment for MRSA infections. Performed at [facility], [address] [place], [place] [address]   Respiratory Panel by PCR     Status: None   Collection Time: 08/31/18  7:32 AM  Result Value Ref Range Status   Adenovirus NOT DETECTED NOT DETECTED Final   Coronavirus 229E NOT DETECTED NOT DETECTED Final    Comment: (NOTE) The Coronavirus on the Respiratory Panel, DOES NOT test for the novel  Coronavirus (2019 nCoV)    Coronavirus HKU1 NOT DETECTED NOT DETECTED Final   Coronavirus NL63 NOT DETECTED NOT DETECTED Final   Coronavirus OC43 NOT DETECTED NOT DETECTED Final   Metapneumovirus NOT DETECTED NOT DETECTED Final   Rhinovirus / Enterovirus NOT DETECTED NOT DETECTED Final   Influenza A NOT DETECTED NOT  DETECTED Final   Influenza B NOT DETECTED NOT DETECTED Final   Parainfluenza Virus 1 NOT DETECTED NOT DETECTED Final   Parainfluenza Virus 2 NOT DETECTED NOT DETECTED Final   Parainfluenza Virus 3 NOT DETECTED NOT DETECTED Final   Parainfluenza Virus 4 NOT DETECTED NOT DETECTED Final   Respiratory Syncytial Virus NOT DETECTED NOT DETECTED Final   Bordetella pertussis NOT DETECTED NOT DETECTED Final   Chlamydophila pneumoniae NOT DETECTED NOT DETECTED Final   Mycoplasma pneumoniae NOT DETECTED NOT DETECTED Final    Comment: Performed at [facility], [place] [address] [place],  [address]     Time coordinating discharge in minutes: 52  SIGNED:   Debbe Odea, MD  [facility] 09/06/2018, 12:41 PM Pager   If 7PM-7AM, please contact night-coverage [email or web address] Password [email or web address]

## 2018-09-06 NOTE — Progress Notes (Signed)
Physical Therapy Treatment Patient Details Name: Joel Valdez MRN: [id number] DOB: Jun 11, 1945 Today's Date: 09/06/2018    History of Present Illness Joel Valdez is a 72 y.o. male with medical history significant for interstitial lung disease chronic respiratory failure, hypertension, rheumatoid arthritis, immunosuppression, who presented to the ED with complaints of difficulty breathing started about 3 hours prior to arrival in the ED.  Reports worsening of his chronic cough also, but his cough over the past 3 weeks has been nonproductive.  Reports worsening lower extremity swelling R>L, without pain redness or warmth.  No fever or chills.    PT Comments    Pt friendly and willing to participate, reports chest feels better today though does continue to have pain Lt chest.  Pt presents with improved vitals with ability to complete bed mobility and transfer to standing with O2 sat at 96% or above x 5 minutes.  Due to appropriate vitals began gait training, pt able to keep O2 saturation above 93% during gait though significant HR range from 37 bpm to 133 bpm.  Gait training terminated with vitals and returned to room.  Pt reports decreased chest pain at EOS, slight SOB.  Able to complete seated LE strengthening exercises with O2 saturation range of 97-100%.  RN aware of status and vitals.   Follow Up Recommendations  SNF     Equipment Recommendations  None recommended by PT    Recommendations for Other Services       Precautions / Restrictions Precautions Precautions: Fall Precaution Comments: monitor sats  Restrictions Weight Bearing Restrictions: No    Mobility  Bed Mobility Overal bed mobility: Independent                Transfers Overall transfer level: Independent                  Ambulation/Gait Ambulation/Gait assistance: Supervision Gait Distance (Feet): 40 Feet(O2 at 95%, HR at 112 until corner and HR decreased to 37 then increase 133 on way back to  room) Assistive device: None Gait Pattern/deviations: Decreased step length - right;Decreased step length - left;Decreased stride length Gait velocity: decreased   General Gait Details: Vitals with HR, able to keep O2 saturation above 93%   Stairs             Wheelchair Mobility    Modified Rankin (Stroke Patients Only)       Balance                                            Cognition Arousal/Alertness: Awake/alert Behavior During Therapy: WFL for tasks assessed/performed Overall Cognitive Status: Within Functional Limits for tasks assessed                                        Exercises General Exercises - Lower Extremity Ankle Circles/Pumps: AROM;Both;15 reps;Seated Long Arc Quad: AROM;Both;10 reps;Seated Hip ABduction/ADduction: AROM;Both;10 reps;Seated Hip Flexion/Marching: AROM;Seated;Both;10 reps Toe Raises: Both;15 reps;Seated Heel Raises: Both;15 reps;Seated    General Comments        Pertinent Vitals/Pain Pain Score: 7  Pain Location: left side upper chest Pain Descriptors / Indicators: Sore Pain Intervention(s): Monitored during session;Limited activity within patient's tolerance    Home Living  Prior Function            PT Goals (current goals can now be found in the care plan section)      Frequency    Min 3X/week      PT Plan Current plan remains appropriate    Co-evaluation              AM-PAC PT "6 Clicks" Mobility   Outcome Measure  Help needed turning from your back to your side while in a flat bed without using bedrails?: None Help needed moving from lying on your back to sitting on the side of a flat bed without using bedrails?: None Help needed moving to and from a bed to a chair (including a wheelchair)?: None Help needed standing up from a chair using your arms (e.g., wheelchair or bedside chair)?: None Help needed to walk in hospital room?: A  Little Help needed climbing 3-5 steps with a railing? : A Little 6 Click Score: 22    End of Session Equipment Utilized During Treatment: Oxygen(3L via nasal cannula) Activity Tolerance: Patient tolerated treatment well;Patient limited by fatigue Patient left: in chair;with call bell/phone within reach Nurse Communication: Mobility status PT Visit Diagnosis: Unsteadiness on feet (R26.81);Other abnormalities of gait and mobility (R26.89);Muscle weakness (generalized) (M62.81)     Time: 1025-1050 PT Time Calculation (min) (ACUTE ONLY): 25 min  Charges:  $Therapeutic Exercise: 8-22 mins $Therapeutic Activity: 8-22 mins                     [address] LPTA; CBIS [phone]  Aldona Lento 09/06/2018, 11:01 AM

## 2018-09-06 NOTE — Plan of Care (Signed)

## 2018-09-06 NOTE — Progress Notes (Signed)
Discussed his situation with him today.  He has limited understanding about pulmonary fibrosis.  He was distracted during my visit so we did not have much of the discussion.  He says he is considering skilled care facility placement which might make some difference.

## 2018-09-06 NOTE — Progress Notes (Addendum)
Palliative:  I met today with Joel Valdez again. He does not remember me from yesterday and cannot recall much that his medical team has told him. He says "I don't know... all I know is my lungs are in bad shape and [place] I acknowledged this as correct. We discussed potential plans for SNF and also potential need for support at home to help his QOL and help with his comfort. I mentioned that this care can be provided by hospice. I asked how he felt about this and he replied that he was not "too keen" on this but acknowledged that if this is what he needs than that is okay. I told him that it is scary but sometimes they can provide a lot of help to get him to feel as good as possible when we know we cannot fix him. He was happy to hear that.   I called and spoke with sister, Joel Valdez. I tried to reach wife, Joel Valdez, but no answer and no option for voicemail. Joel Valdez and I spoke about SNF vs home with hospice. She has been speaking with Joel Valdez since we spoke yesterday. Joel Valdez is concerned that her brother may not be emotionally ready for hospice but also does not want him to suffer or struggle to breathe.   Joel Valdez verified plan with wife, Joel Valdez (I called Joel Valdez to confirm) and they have decided to take him home. They have decided to hold off on hospice for now until they can discuss further with him as a family at home. I have placed Living Will packet in his bags to take home and Joel Valdez educated on current changes that notary and no witnesses are needed to complete. They also plan to clarify DNR status with him as well. Family understands that reintubation is not recommended and that he is unlikely to be successfully extubated. Family focus is on his comfort and minimizing suffering but also respecting his wishes.   All questions/concerns addressed. Emotional support provided.  Exam: Alert, oriented but forgetful. No distress.   Plan: - Home with home health. Family to further  discuss code status, Living Will, hospice options at home.  - Family requests pulmonary follow up with Dr. Luan Pulling but if this is not possible than follow up with Dr. Melvyn Novas.  - Family inquiring about follow up for RA and prostate cancer. Taken off methotrexate and continuing on chronic prednisone for RA - probably okay to hold off on follow up. Also likely no follow up needed for prostate cancer at this time but I will order PSA and reach out to Dr. Diona Fanti to advise them. They understand the underlying issue at this time is his pulmonary fibrosis (this is his life limiting condition) and the difficulties of getting him to multiple appointments for other conditions likely outweigh the benefits at this stage.  - Contacted [facility]MR[facility] with family request for [place].   21 min  Vinie Sill, NP Palliative Medicine Team Pager # [phone] (M-F 8a-5p) Team Phone # [phone] (Nights/Weekends)

## 2018-09-07 ENCOUNTER — Telehealth: Payer: Self-pay | Admitting: Physician Assistant

## 2018-09-07 ENCOUNTER — Other Ambulatory Visit: Payer: Self-pay | Admitting: *Deleted

## 2018-09-07 ENCOUNTER — Other Ambulatory Visit: Payer: Self-pay

## 2018-09-07 ENCOUNTER — Ambulatory Visit (INDEPENDENT_AMBULATORY_CARE_PROVIDER_SITE_OTHER): Payer: Medicare Other | Admitting: Physician Assistant

## 2018-09-07 ENCOUNTER — Encounter: Payer: Self-pay | Admitting: Physician Assistant

## 2018-09-07 VITALS — HR 90

## 2018-09-07 DIAGNOSIS — I5033 Acute on chronic diastolic (congestive) heart failure: Secondary | ICD-10-CM

## 2018-09-07 DIAGNOSIS — J9611 Chronic respiratory failure with hypoxia: Secondary | ICD-10-CM | POA: Diagnosis not present

## 2018-09-07 DIAGNOSIS — B9689 Other specified bacterial agents as the cause of diseases classified elsewhere: Secondary | ICD-10-CM

## 2018-09-07 DIAGNOSIS — N19 Unspecified kidney failure: Secondary | ICD-10-CM

## 2018-09-07 DIAGNOSIS — J208 Acute bronchitis due to other specified organisms: Secondary | ICD-10-CM

## 2018-09-07 DIAGNOSIS — J8410 Pulmonary fibrosis, unspecified: Secondary | ICD-10-CM | POA: Diagnosis not present

## 2018-09-07 DIAGNOSIS — I10 Essential (primary) hypertension: Secondary | ICD-10-CM | POA: Diagnosis not present

## 2018-09-07 DIAGNOSIS — R7989 Other specified abnormal findings of blood chemistry: Secondary | ICD-10-CM

## 2018-09-07 NOTE — Progress Notes (Signed)
I have discussed the procedure for the telephone visit with the patient who has given consent to proceed with assessment and treatment.   Joel Valdez, CMA

## 2018-09-07 NOTE — Telephone Encounter (Signed)
Wife states she forgot to ask Einar Pheasant about what pt sodium intake should be and would like a call back

## 2018-09-07 NOTE — Patient Outreach (Signed)
Transition of care call attempted no answer and unable to leave a message. I also emailed the address on file to see if I could get a response that way.   If I do not hear back I will call his MD office for other suggestions on how to reach this pt.  Eulah Pont. Myrtie Neither, MSN, [facility] Gerontological Nurse Practitioner [facility] Care Management [phone]

## 2018-09-07 NOTE — Telephone Encounter (Signed)
Spoke with patient wife about the amount of sodium intake. Given recommendations from PCP below 2300 but prefer  less than 1700. The handout for sodium intake is in the mail.. She is agreeable.

## 2018-09-07 NOTE — Telephone Encounter (Signed)
As discussed I am mailing them a handout on dietary recommendations. Would keep < 2300 mg at least but prefer 1700 or less.

## 2018-09-08 DIAGNOSIS — M069 Rheumatoid arthritis, unspecified: Secondary | ICD-10-CM | POA: Diagnosis not present

## 2018-09-08 DIAGNOSIS — H409 Unspecified glaucoma: Secondary | ICD-10-CM | POA: Diagnosis not present

## 2018-09-08 DIAGNOSIS — J45909 Unspecified asthma, uncomplicated: Secondary | ICD-10-CM | POA: Diagnosis not present

## 2018-09-08 DIAGNOSIS — J40 Bronchitis, not specified as acute or chronic: Secondary | ICD-10-CM | POA: Diagnosis not present

## 2018-09-08 DIAGNOSIS — Z87891 Personal history of nicotine dependence: Secondary | ICD-10-CM | POA: Diagnosis not present

## 2018-09-08 DIAGNOSIS — I110 Hypertensive heart disease with heart failure: Secondary | ICD-10-CM | POA: Diagnosis not present

## 2018-09-08 DIAGNOSIS — J61 Pneumoconiosis due to asbestos and other mineral fibers: Secondary | ICD-10-CM | POA: Diagnosis not present

## 2018-09-08 DIAGNOSIS — J9621 Acute and chronic respiratory failure with hypoxia: Secondary | ICD-10-CM | POA: Diagnosis not present

## 2018-09-08 DIAGNOSIS — Z9981 Dependence on supplemental oxygen: Secondary | ICD-10-CM | POA: Diagnosis not present

## 2018-09-08 DIAGNOSIS — J8489 Other specified interstitial pulmonary diseases: Secondary | ICD-10-CM | POA: Diagnosis not present

## 2018-09-08 DIAGNOSIS — J439 Emphysema, unspecified: Secondary | ICD-10-CM | POA: Diagnosis not present

## 2018-09-08 DIAGNOSIS — I700 Atherosclerosis of aorta: Secondary | ICD-10-CM | POA: Diagnosis not present

## 2018-09-08 DIAGNOSIS — I5033 Acute on chronic diastolic (congestive) heart failure: Secondary | ICD-10-CM | POA: Diagnosis not present

## 2018-09-08 DIAGNOSIS — K219 Gastro-esophageal reflux disease without esophagitis: Secondary | ICD-10-CM | POA: Diagnosis not present

## 2018-09-08 DIAGNOSIS — L989 Disorder of the skin and subcutaneous tissue, unspecified: Secondary | ICD-10-CM | POA: Diagnosis not present

## 2018-09-08 DIAGNOSIS — E785 Hyperlipidemia, unspecified: Secondary | ICD-10-CM | POA: Diagnosis not present

## 2018-09-08 DIAGNOSIS — I2510 Atherosclerotic heart disease of native coronary artery without angina pectoris: Secondary | ICD-10-CM | POA: Diagnosis not present

## 2018-09-09 DIAGNOSIS — E785 Hyperlipidemia, unspecified: Secondary | ICD-10-CM | POA: Diagnosis not present

## 2018-09-09 DIAGNOSIS — H409 Unspecified glaucoma: Secondary | ICD-10-CM | POA: Diagnosis not present

## 2018-09-09 DIAGNOSIS — I2510 Atherosclerotic heart disease of native coronary artery without angina pectoris: Secondary | ICD-10-CM | POA: Diagnosis not present

## 2018-09-09 DIAGNOSIS — M069 Rheumatoid arthritis, unspecified: Secondary | ICD-10-CM | POA: Diagnosis not present

## 2018-09-09 DIAGNOSIS — K219 Gastro-esophageal reflux disease without esophagitis: Secondary | ICD-10-CM | POA: Diagnosis not present

## 2018-09-09 DIAGNOSIS — J9621 Acute and chronic respiratory failure with hypoxia: Secondary | ICD-10-CM | POA: Diagnosis not present

## 2018-09-09 DIAGNOSIS — J8489 Other specified interstitial pulmonary diseases: Secondary | ICD-10-CM | POA: Diagnosis not present

## 2018-09-09 DIAGNOSIS — I5033 Acute on chronic diastolic (congestive) heart failure: Secondary | ICD-10-CM | POA: Diagnosis not present

## 2018-09-09 DIAGNOSIS — Z9981 Dependence on supplemental oxygen: Secondary | ICD-10-CM | POA: Diagnosis not present

## 2018-09-09 DIAGNOSIS — J40 Bronchitis, not specified as acute or chronic: Secondary | ICD-10-CM | POA: Diagnosis not present

## 2018-09-09 DIAGNOSIS — I110 Hypertensive heart disease with heart failure: Secondary | ICD-10-CM | POA: Diagnosis not present

## 2018-09-09 DIAGNOSIS — Z87891 Personal history of nicotine dependence: Secondary | ICD-10-CM | POA: Diagnosis not present

## 2018-09-09 DIAGNOSIS — I700 Atherosclerosis of aorta: Secondary | ICD-10-CM | POA: Diagnosis not present

## 2018-09-09 DIAGNOSIS — J61 Pneumoconiosis due to asbestos and other mineral fibers: Secondary | ICD-10-CM | POA: Diagnosis not present

## 2018-09-09 DIAGNOSIS — J45909 Unspecified asthma, uncomplicated: Secondary | ICD-10-CM | POA: Diagnosis not present

## 2018-09-09 DIAGNOSIS — L989 Disorder of the skin and subcutaneous tissue, unspecified: Secondary | ICD-10-CM | POA: Diagnosis not present

## 2018-09-09 DIAGNOSIS — J439 Emphysema, unspecified: Secondary | ICD-10-CM | POA: Diagnosis not present

## 2018-09-10 ENCOUNTER — Telehealth: Payer: Self-pay | Admitting: Physician Assistant

## 2018-09-10 ENCOUNTER — Other Ambulatory Visit: Payer: Self-pay | Admitting: *Deleted

## 2018-09-10 ENCOUNTER — Ambulatory Visit: Payer: Medicare Other | Admitting: Internal Medicine

## 2018-09-10 ENCOUNTER — Other Ambulatory Visit: Payer: Self-pay

## 2018-09-10 NOTE — Telephone Encounter (Signed)
Verbal orders given to [facility] at [facility].

## 2018-09-10 NOTE — Telephone Encounter (Signed)
Copied from [place] [id number]. Topic: Quick Communication - Home Health Verbal Orders >> Sep 10, 2018 10:18 AM Erick Blinks wrote: Caller/Agency: [facility] [place] Number: [phone] Requesting OT/PT/Skilled Nursing/Social Work/Speech Therapy: Nurse visitation twice a week for the next 8 weeks Frequency:  2 week 1  2 week 2 2 week 3  2 week 4 2 week 5 2 week 6 2 week 7 2 week 8

## 2018-09-10 NOTE — Telephone Encounter (Signed)
Called # provided states that the # couldn't be completed as dialed. No note in the system as to what [place].

## 2018-09-10 NOTE — Telephone Encounter (Signed)
Ok for verbal orders ?

## 2018-09-10 NOTE — Progress Notes (Signed)
Virtual Visit via Telephone Note  I connected with Joel Valdez on 09/07/18 at 11:00 AM EDT by telephone and verified that I am speaking with the correct person using two identifiers.  Location: Patient: Home Provider: [facility]   I discussed the limitations, risks, security and privacy concerns of performing an evaluation and management service by telephone and the availability of in person appointments. I also discussed with the patient that there may be a patient responsible charge related to this service. The patient expressed understanding and agreed to proceed.  History of Present Illness: Patient presents via phone today for hospitalization follow-up. Patient readmitted via ER on 08/30/2018 after being sent there by this office and pulmonologist via EMS due to worsening SOB with increased oxygen requirements. Patient arrived with CPAP on. Removed and O2 sats dropped into the 70s. Patient tachypneic and tachycardic. Patient placed on nasal cannula and then transitioned to BiPAP. Patient given IV steroids and neb treatments. ER workup at that time included labs (troponin 0.05, Hgb 10.4, Calcium 8.8, pH normal), CXR (no acute findings), COVID testing again negative. CTA ordered with results pending at time of admission to ICU. CTA revealed advanced interstitial fibrosis with likely superimposed acute bronchitis and enlarging R pleural effusion.  Hospitalization was 7 days and complicated. For a complete synopsis of hospitalization please refer to the appropriate portions of the EMR. Hospital course summarized as noted below:  During hospitalization patient received IV diuretics (acute on chronic diastolic HF), solumedrol and antibiotics. Was started on incentive spirometry, Tussionex and Mucinex. Patient remained on 4-6L O2 throughout stay. Palliative care consult was performed due to patient's advanced fibrosis and current oxygen requirements. Patient and family  declined palliative care services. Patient was discharged home on 09/06/2018 to continue a prolonged prednisone taper, to finish antibiotic course and to take daily Lasix. HH PT, RN was set back up through [facility]. Was recommended he follow-up with Pulmonology in 1 week from discharge.   Since discharge, patient and wife endorse the patient is doing better overall. Still noting fatigue but breathing is much better than before. Is keeping at 4L O2 with rest and exertion with saturations 94-98% per wife. HH RN is coming out this weekend for assessment and they expect PT to start again next week. Denies any residual chest pain. Is trying to keep hydrated. Is eating mostly processed foods currently. Patient notes he is taking his medications as directed, including the Lasix but has noted increased foot and ankle swelling bilaterally over the past 2 days. Denies PND or orthopnea.   Of note, they are working on getting follow-up scheduled with Pulmonology (Dr. Luan Pulling - [facility]) but are waiting on a callback from office.    Observations/Objective: Patient is in no acute distress.  Resting comfortably at home.  No labored breathing.  Speech is clear and coherent with logical content.  Patient is alert and oriented at baseline.   Assessment and Plan: 1. Chronic respiratory failure with hypoxia (HCC) Saturating well on current O2 settings. Continue 4L O2. Continue spirometry. Finish steroid taper. Follow-up with Pulmonology. They are to let us know as soon as they get appt with Dr. Luan Pulling. If having issue today wife will let us know so that we can help with this. Strict ER precautions reviewed.  2. Acute bacterial bronchitis Resolved clinically. Has completed antibiotics. Denies chest congestion or productive cough. Is completing course of steroid. Follow-up ASAP for any recurring symptoms.  3. Interstitial pulmonary fibrosis (HCC) Advanced.  No needing O2 daily, no need prior to recent illness. Declines  palliative measures. They want to work on him having the best quality of life and are hopeful he will continue to improve somewhat. Follow-up with Pulmonology as scheduled.   4. Acute on chronic diastolic CHF (congestive heart failure) (HCC) Mild foot swelling. Discussed need to cut back on processed foods due to sodium content. Measure intake and output. Weigh daily.   5. Essential hypertension, benign BP improved today from pre-hospitalization levels. Repeat BMP and CBC w HH RN.   6. Acute prerenal azotemia Is hydrating. Will recheck BMP via [facility] RN.   Follow Up Instructions: Will follow-up Monday AM via phone to reassess feet/ankles.   I discussed the assessment and treatment plan with the patient. The patient was provided an opportunity to ask questions and all were answered. The patient agreed with the plan and demonstrated an understanding of the instructions.   The patient was advised to call back or seek an in-person evaluation if the symptoms worsen or if the condition fails to improve as anticipated.  I provided 25 minutes of non-face-to-face time during this encounter.   Leeanne Rio, PA-C

## 2018-09-10 NOTE — Patient Outreach (Signed)
Transition of care call #2 made. Was able to speak with pt's wife only, pt was resting. Requested that she give him a message to please return my call regarding care management services that he is eligible for. She states that she will.  Eulah Pont. Myrtie Neither, MSN, [facility] Gerontological Nurse Practitioner [facility] Care Management [phone]

## 2018-09-11 ENCOUNTER — Telehealth: Payer: Self-pay | Admitting: Emergency Medicine

## 2018-09-11 DIAGNOSIS — Z87891 Personal history of nicotine dependence: Secondary | ICD-10-CM | POA: Diagnosis not present

## 2018-09-11 DIAGNOSIS — J40 Bronchitis, not specified as acute or chronic: Secondary | ICD-10-CM | POA: Diagnosis not present

## 2018-09-11 DIAGNOSIS — J8489 Other specified interstitial pulmonary diseases: Secondary | ICD-10-CM | POA: Diagnosis not present

## 2018-09-11 DIAGNOSIS — I2510 Atherosclerotic heart disease of native coronary artery without angina pectoris: Secondary | ICD-10-CM | POA: Diagnosis not present

## 2018-09-11 DIAGNOSIS — K219 Gastro-esophageal reflux disease without esophagitis: Secondary | ICD-10-CM | POA: Diagnosis not present

## 2018-09-11 DIAGNOSIS — Z9981 Dependence on supplemental oxygen: Secondary | ICD-10-CM | POA: Diagnosis not present

## 2018-09-11 DIAGNOSIS — J61 Pneumoconiosis due to asbestos and other mineral fibers: Secondary | ICD-10-CM | POA: Diagnosis not present

## 2018-09-11 DIAGNOSIS — I110 Hypertensive heart disease with heart failure: Secondary | ICD-10-CM | POA: Diagnosis not present

## 2018-09-11 DIAGNOSIS — I5033 Acute on chronic diastolic (congestive) heart failure: Secondary | ICD-10-CM | POA: Diagnosis not present

## 2018-09-11 DIAGNOSIS — J45909 Unspecified asthma, uncomplicated: Secondary | ICD-10-CM | POA: Diagnosis not present

## 2018-09-11 DIAGNOSIS — H409 Unspecified glaucoma: Secondary | ICD-10-CM | POA: Diagnosis not present

## 2018-09-11 DIAGNOSIS — I700 Atherosclerosis of aorta: Secondary | ICD-10-CM | POA: Diagnosis not present

## 2018-09-11 DIAGNOSIS — M069 Rheumatoid arthritis, unspecified: Secondary | ICD-10-CM | POA: Diagnosis not present

## 2018-09-11 DIAGNOSIS — J439 Emphysema, unspecified: Secondary | ICD-10-CM | POA: Diagnosis not present

## 2018-09-11 DIAGNOSIS — L989 Disorder of the skin and subcutaneous tissue, unspecified: Secondary | ICD-10-CM | POA: Diagnosis not present

## 2018-09-11 DIAGNOSIS — J9621 Acute and chronic respiratory failure with hypoxia: Secondary | ICD-10-CM | POA: Diagnosis not present

## 2018-09-11 DIAGNOSIS — E785 Hyperlipidemia, unspecified: Secondary | ICD-10-CM | POA: Diagnosis not present

## 2018-09-11 NOTE — Telephone Encounter (Signed)
Spoke with patient wife [name] She states his feet/ankles are not as swollen as before. Some improvements She has cut down on his salt intake. He is not as active. Does not elevate feet. Not getting in enough water. Only enough to take medications. Drinking 4 cups of coffee a day Blood pressures are running low again. Breathing doing good, O2 90's is doing well  PT came on Sunday 82/60, 88/62 weight 144 94/55 yesterday weight 147 This morning weight 146 Taking Lasix 1 tablet twice daily The nurse has not drawn blood yet. The nurses has not started coming out yet Please advise

## 2018-09-11 NOTE — Telephone Encounter (Signed)
Cut Lasix back to 1 tablet daily. Cannot continue twice daily dosing if he is not eating/drinking as this will drop BP too much. Needs to work on intake. Needs to elevate legs and wear compression stockings. At this point we need to decide if he wants to reconsider palliative care measures as he declined these in hospital but is not working on things to improve BP, and current status. I know this is very difficult and each person handles things differently but we do need to address it.   I will await the [facility] RN and case manager reports as well so we can determine further changes. We need to call [facility] again today to make sure they draw the labs as requested LAST WEEK. I am happy to see him in office or via phone to discuss current state and options.   Keeping them in my thoughts and prayers.

## 2018-09-12 ENCOUNTER — Other Ambulatory Visit: Payer: Self-pay | Admitting: Physician Assistant

## 2018-09-12 DIAGNOSIS — I5033 Acute on chronic diastolic (congestive) heart failure: Secondary | ICD-10-CM

## 2018-09-12 NOTE — Telephone Encounter (Signed)
She took his BP yesterday at 11:56 a  102/51 P 92 O2 98.  He feet is improving enough that he can put his shoes on his feet yesterday.  She will cut his Lasix back to 1 tablet daily. He is feeling better today. Has been eating/snacking more He does not have compression stockings to wear. Will need a rx for compression stockings.   He is having trouble sleeping at night. Has been taking Melatonin. He has trouble falling asleep and staying asleep. Wants something to help sleep.  Will contact [facility] about orders for labs.

## 2018-09-12 NOTE — Telephone Encounter (Signed)
Spoke with Melissa at [facility]. A nurse will be out to the home tomorrow and draw labs given verbal order.

## 2018-09-12 NOTE — Telephone Encounter (Signed)
Will need to talk to pulmonologist about sleep aide as it affects breathing and I am not comfortable prescribing.  Keep up with 1 Lasix per day, elevating legs. Will make sure that a new script for compression stockings is sent to DME supplier.   Check with HH as they should have already been out to draw labs.

## 2018-09-12 NOTE — Telephone Encounter (Signed)
Advised patient wife [name] to continue Lasix 1 tablet daily. Elevate legs and increase hydration.  Home health nurse should be out tomorrow to draw labs.

## 2018-09-13 ENCOUNTER — Encounter (HOSPITAL_COMMUNITY): Payer: Self-pay

## 2018-09-13 ENCOUNTER — Inpatient Hospital Stay (HOSPITAL_COMMUNITY)
Admission: EM | Admit: 2018-09-13 | Discharge: 2018-09-16 | DRG: 291 | Disposition: A | Discharge status: Home / Self Care | Payer: Medicare Other | Attending: Internal Medicine | Admitting: Internal Medicine

## 2018-09-13 ENCOUNTER — Ambulatory Visit: Payer: Self-pay | Admitting: Physician Assistant

## 2018-09-13 ENCOUNTER — Other Ambulatory Visit: Payer: Self-pay

## 2018-09-13 ENCOUNTER — Emergency Department (HOSPITAL_COMMUNITY): Payer: Medicare Other

## 2018-09-13 DIAGNOSIS — L89302 Pressure ulcer of unspecified buttock, stage 2: Secondary | ICD-10-CM | POA: Diagnosis present

## 2018-09-13 DIAGNOSIS — M069 Rheumatoid arthritis, unspecified: Secondary | ICD-10-CM | POA: Diagnosis not present

## 2018-09-13 DIAGNOSIS — Z9981 Dependence on supplemental oxygen: Secondary | ICD-10-CM

## 2018-09-13 DIAGNOSIS — J84112 Idiopathic pulmonary fibrosis: Secondary | ICD-10-CM | POA: Diagnosis not present

## 2018-09-13 DIAGNOSIS — I700 Atherosclerosis of aorta: Secondary | ICD-10-CM | POA: Diagnosis not present

## 2018-09-13 DIAGNOSIS — J61 Pneumoconiosis due to asbestos and other mineral fibers: Secondary | ICD-10-CM | POA: Diagnosis not present

## 2018-09-13 DIAGNOSIS — C61 Malignant neoplasm of prostate: Secondary | ICD-10-CM | POA: Diagnosis present

## 2018-09-13 DIAGNOSIS — Z8611 Personal history of tuberculosis: Secondary | ICD-10-CM | POA: Diagnosis not present

## 2018-09-13 DIAGNOSIS — H409 Unspecified glaucoma: Secondary | ICD-10-CM | POA: Diagnosis present

## 2018-09-13 DIAGNOSIS — I5033 Acute on chronic diastolic (congestive) heart failure: Secondary | ICD-10-CM | POA: Diagnosis present

## 2018-09-13 DIAGNOSIS — E876 Hypokalemia: Secondary | ICD-10-CM | POA: Diagnosis not present

## 2018-09-13 DIAGNOSIS — Z7952 Long term (current) use of systemic steroids: Secondary | ICD-10-CM

## 2018-09-13 DIAGNOSIS — R069 Unspecified abnormalities of breathing: Secondary | ICD-10-CM | POA: Diagnosis not present

## 2018-09-13 DIAGNOSIS — Z77090 Contact with and (suspected) exposure to asbestos: Secondary | ICD-10-CM | POA: Diagnosis present

## 2018-09-13 DIAGNOSIS — J45909 Unspecified asthma, uncomplicated: Secondary | ICD-10-CM | POA: Diagnosis not present

## 2018-09-13 DIAGNOSIS — F419 Anxiety disorder, unspecified: Secondary | ICD-10-CM | POA: Diagnosis present

## 2018-09-13 DIAGNOSIS — I2781 Cor pulmonale (chronic): Secondary | ICD-10-CM | POA: Diagnosis present

## 2018-09-13 DIAGNOSIS — J439 Emphysema, unspecified: Secondary | ICD-10-CM | POA: Diagnosis not present

## 2018-09-13 DIAGNOSIS — Z8261 Family history of arthritis: Secondary | ICD-10-CM

## 2018-09-13 DIAGNOSIS — J9621 Acute and chronic respiratory failure with hypoxia: Secondary | ICD-10-CM | POA: Diagnosis not present

## 2018-09-13 DIAGNOSIS — K219 Gastro-esophageal reflux disease without esophagitis: Secondary | ICD-10-CM | POA: Diagnosis not present

## 2018-09-13 DIAGNOSIS — N179 Acute kidney failure, unspecified: Secondary | ICD-10-CM

## 2018-09-13 DIAGNOSIS — R0602 Shortness of breath: Secondary | ICD-10-CM | POA: Diagnosis not present

## 2018-09-13 DIAGNOSIS — R064 Hyperventilation: Secondary | ICD-10-CM | POA: Diagnosis not present

## 2018-09-13 DIAGNOSIS — J449 Chronic obstructive pulmonary disease, unspecified: Secondary | ICD-10-CM | POA: Diagnosis present

## 2018-09-13 DIAGNOSIS — R918 Other nonspecific abnormal finding of lung field: Secondary | ICD-10-CM | POA: Diagnosis not present

## 2018-09-13 DIAGNOSIS — D72828 Other elevated white blood cell count: Secondary | ICD-10-CM | POA: Diagnosis not present

## 2018-09-13 DIAGNOSIS — J8489 Other specified interstitial pulmonary diseases: Secondary | ICD-10-CM | POA: Diagnosis not present

## 2018-09-13 DIAGNOSIS — I10 Essential (primary) hypertension: Secondary | ICD-10-CM | POA: Diagnosis not present

## 2018-09-13 DIAGNOSIS — Z79899 Other long term (current) drug therapy: Secondary | ICD-10-CM | POA: Diagnosis not present

## 2018-09-13 DIAGNOSIS — E785 Hyperlipidemia, unspecified: Secondary | ICD-10-CM | POA: Diagnosis present

## 2018-09-13 DIAGNOSIS — Z8249 Family history of ischemic heart disease and other diseases of the circulatory system: Secondary | ICD-10-CM

## 2018-09-13 DIAGNOSIS — J8410 Pulmonary fibrosis, unspecified: Secondary | ICD-10-CM | POA: Diagnosis not present

## 2018-09-13 DIAGNOSIS — J189 Pneumonia, unspecified organism: Secondary | ICD-10-CM

## 2018-09-13 DIAGNOSIS — Z87891 Personal history of nicotine dependence: Secondary | ICD-10-CM | POA: Diagnosis not present

## 2018-09-13 DIAGNOSIS — I110 Hypertensive heart disease with heart failure: Principal | ICD-10-CM | POA: Diagnosis present

## 2018-09-13 DIAGNOSIS — Z888 Allergy status to other drugs, medicaments and biological substances status: Secondary | ICD-10-CM

## 2018-09-13 DIAGNOSIS — H9191 Unspecified hearing loss, right ear: Secondary | ICD-10-CM | POA: Diagnosis present

## 2018-09-13 DIAGNOSIS — J40 Bronchitis, not specified as acute or chronic: Secondary | ICD-10-CM | POA: Diagnosis not present

## 2018-09-13 DIAGNOSIS — I2510 Atherosclerotic heart disease of native coronary artery without angina pectoris: Secondary | ICD-10-CM | POA: Diagnosis not present

## 2018-09-13 DIAGNOSIS — R0600 Dyspnea, unspecified: Secondary | ICD-10-CM | POA: Diagnosis not present

## 2018-09-13 DIAGNOSIS — Z1159 Encounter for screening for other viral diseases: Secondary | ICD-10-CM | POA: Diagnosis not present

## 2018-09-13 DIAGNOSIS — F329 Major depressive disorder, single episode, unspecified: Secondary | ICD-10-CM | POA: Diagnosis present

## 2018-09-13 DIAGNOSIS — L989 Disorder of the skin and subcutaneous tissue, unspecified: Secondary | ICD-10-CM | POA: Diagnosis not present

## 2018-09-13 DIAGNOSIS — I2609 Other pulmonary embolism with acute cor pulmonale: Secondary | ICD-10-CM | POA: Diagnosis not present

## 2018-09-13 LAB — CBC WITH DIFFERENTIAL/PLATELET
Abs Immature Granulocytes: 0.98 10*3/uL — ABNORMAL HIGH (ref 0.00–0.07)
Basophils Absolute: 0.1 10*3/uL (ref 0.0–0.1)
Basophils Relative: 0 %
Eosinophils Absolute: 0 10*3/uL (ref 0.0–0.5)
Eosinophils Relative: 0 %
HCT: 30.7 % — ABNORMAL LOW (ref 39.0–52.0)
Hemoglobin: 9.7 g/dL — ABNORMAL LOW (ref 13.0–17.0)
Immature Granulocytes: 4 %
Lymphocytes Relative: 4 %
Lymphs Abs: 1 10*3/uL (ref 0.7–4.0)
MCH: 28 pg (ref 26.0–34.0)
MCHC: 31.6 g/dL (ref 30.0–36.0)
MCV: 88.5 fL (ref 80.0–100.0)
Monocytes Absolute: 1.4 10*3/uL — ABNORMAL HIGH (ref 0.1–1.0)
Monocytes Relative: 6 %
Neutro Abs: 20.6 10*3/uL — ABNORMAL HIGH (ref 1.7–7.7)
Neutrophils Relative %: 86 %
Platelets: 371 10*3/uL (ref 150–400)
RBC: 3.47 MIL/uL — ABNORMAL LOW (ref 4.22–5.81)
RDW: 18.8 % — ABNORMAL HIGH (ref 11.5–15.5)
WBC: 24.1 10*3/uL — ABNORMAL HIGH (ref 4.0–10.5)
nRBC: 0 % (ref 0.0–0.2)

## 2018-09-13 LAB — BASIC METABOLIC PANEL
Anion gap: 12 (ref 5–15)
BUN: 29 mg/dL — ABNORMAL HIGH (ref 8–23)
CO2: 28 mmol/L (ref 22–32)
Calcium: 8.8 mg/dL — ABNORMAL LOW (ref 8.9–10.3)
Chloride: 91 mmol/L — ABNORMAL LOW (ref 98–111)
Creatinine, Ser: 1.59 mg/dL — ABNORMAL HIGH (ref 0.61–1.24)
GFR calc Af Amer: 49 mL/min — ABNORMAL LOW (ref >60)
GFR calc non Af Amer: 42 mL/min — ABNORMAL LOW (ref >60)
Glucose, Bld: 114 mg/dL — ABNORMAL HIGH (ref 70–99)
Potassium: 3.9 mmol/L (ref 3.5–5.1)
Sodium: 131 mmol/L — ABNORMAL LOW (ref 135–145)

## 2018-09-13 LAB — SARS CORONAVIRUS 2 BY RT PCR (HOSPITAL ORDER, PERFORMED IN ~~LOC~~ HOSPITAL LAB): SARS Coronavirus 2: NEGATIVE

## 2018-09-13 LAB — TROPONIN I: Troponin I: <0.03 ng/mL (ref <0.03)

## 2018-09-13 MED ORDER — LATANOPROST 0.005 % OP SOLN
OPHTHALMIC | Status: AC
  Filled 2018-09-13: qty 2.5

## 2018-09-13 MED ORDER — SODIUM CHLORIDE 0.9 % IV SOLN
2.0000 g | Freq: Two times a day (BID) | INTRAVENOUS | Status: DC
  Administered 2018-09-13 – 2018-09-15 (×4): 2 g via INTRAVENOUS
  Filled 2018-09-13 (×4): qty 2

## 2018-09-13 MED ORDER — SODIUM CHLORIDE 0.9% FLUSH
3.0000 mL | Freq: Two times a day (BID) | INTRAVENOUS | Status: DC
  Administered 2018-09-13 – 2018-09-16 (×5): 3 mL via INTRAVENOUS

## 2018-09-13 MED ORDER — HYDROCODONE-ACETAMINOPHEN 5-325 MG PO TABS
1.0000 | ORAL_TABLET | Freq: Four times a day (QID) | ORAL | Status: DC | PRN
  Administered 2018-09-14 – 2018-09-16 (×6): 1 via ORAL
  Filled 2018-09-13 (×6): qty 1

## 2018-09-13 MED ORDER — METHYLPREDNISOLONE SODIUM SUCC 40 MG IJ SOLR
40.0000 mg | Freq: Two times a day (BID) | INTRAMUSCULAR | Status: DC
  Administered 2018-09-13 – 2018-09-14 (×2): 40 mg via INTRAVENOUS
  Filled 2018-09-13 (×2): qty 1

## 2018-09-13 MED ORDER — LEUPROLIDE ACETATE (4 MONTH) 30 MG IM KIT: 30.0000 mg | PACK | INTRAMUSCULAR | Status: DC

## 2018-09-13 MED ORDER — SODIUM CHLORIDE 0.9% FLUSH: 3.0000 mL | INTRAVENOUS | Status: DC | PRN

## 2018-09-13 MED ORDER — ONDANSETRON HCL 4 MG PO TABS: 4.0000 mg | ORAL_TABLET | Freq: Four times a day (QID) | ORAL | Status: DC | PRN

## 2018-09-13 MED ORDER — FUROSEMIDE 10 MG/ML IJ SOLN
40.0000 mg | Freq: Every day | INTRAMUSCULAR | Status: DC
  Administered 2018-09-13: 22:00:00 40 mg via INTRAVENOUS
  Filled 2018-09-13: qty 4

## 2018-09-13 MED ORDER — ESCITALOPRAM OXALATE 10 MG PO TABS
10.0000 mg | ORAL_TABLET | Freq: Every day | ORAL | Status: DC
  Administered 2018-09-14 – 2018-09-16 (×3): 10 mg via ORAL
  Filled 2018-09-13 (×3): qty 1

## 2018-09-13 MED ORDER — HYDROCOD POLST-CPM POLST ER 10-8 MG/5ML PO SUER: 5.0000 mL | Freq: Two times a day (BID) | ORAL | Status: DC | PRN

## 2018-09-13 MED ORDER — VANCOMYCIN HCL IN DEXTROSE 1-5 GM/200ML-% IV SOLN: 1000.0000 mg | INTRAVENOUS | Status: DC

## 2018-09-13 MED ORDER — ACETAMINOPHEN 325 MG PO TABS: 650.0000 mg | ORAL_TABLET | Freq: Four times a day (QID) | ORAL | Status: DC | PRN

## 2018-09-13 MED ORDER — ONDANSETRON HCL 4 MG/2ML IJ SOLN: 4.0000 mg | Freq: Four times a day (QID) | INTRAMUSCULAR | Status: DC | PRN

## 2018-09-13 MED ORDER — POLYETHYLENE GLYCOL 3350 17 G PO PACK: 17.0000 g | PACK | Freq: Every day | ORAL | Status: DC | PRN

## 2018-09-13 MED ORDER — GUAIFENESIN ER 600 MG PO TB12
600.0000 mg | ORAL_TABLET | Freq: Two times a day (BID) | ORAL | Status: DC
  Administered 2018-09-13 – 2018-09-16 (×6): 600 mg via ORAL
  Filled 2018-09-13 (×6): qty 1

## 2018-09-13 MED ORDER — IPRATROPIUM-ALBUTEROL 0.5-2.5 (3) MG/3ML IN SOLN
3.0000 mL | Freq: Three times a day (TID) | RESPIRATORY_TRACT | Status: DC
  Administered 2018-09-14 – 2018-09-16 (×7): 3 mL via RESPIRATORY_TRACT
  Filled 2018-09-13 (×8): qty 3

## 2018-09-13 MED ORDER — ACETAMINOPHEN 650 MG RE SUPP: 650.0000 mg | Freq: Four times a day (QID) | RECTAL | Status: DC | PRN

## 2018-09-13 MED ORDER — TRAZODONE HCL 50 MG PO TABS
50.0000 mg | ORAL_TABLET | Freq: Every evening | ORAL | Status: DC | PRN
  Administered 2018-09-14 – 2018-09-15 (×2): 50 mg via ORAL
  Filled 2018-09-13 (×2): qty 1

## 2018-09-13 MED ORDER — VANCOMYCIN HCL 10 G IV SOLR
1500.0000 mg | Freq: Once | INTRAVENOUS | Status: AC
  Administered 2018-09-13: 22:00:00 1500 mg via INTRAVENOUS
  Filled 2018-09-13 (×2): qty 1500

## 2018-09-13 MED ORDER — FOLIC ACID 1 MG PO TABS
1.0000 mg | ORAL_TABLET | Freq: Every morning | ORAL | Status: DC
  Administered 2018-09-14 – 2018-09-16 (×3): 1 mg via ORAL
  Filled 2018-09-13 (×3): qty 1

## 2018-09-13 MED ORDER — LATANOPROST 0.005 % OP SOLN
1.0000 [drp] | Freq: Every day | OPHTHALMIC | Status: DC
  Administered 2018-09-13 – 2018-09-15 (×3): 1 [drp] via OPHTHALMIC
  Filled 2018-09-13: qty 2.5

## 2018-09-13 MED ORDER — ALBUTEROL SULFATE (2.5 MG/3ML) 0.083% IN NEBU: 2.5000 mg | INHALATION_SOLUTION | RESPIRATORY_TRACT | Status: DC | PRN

## 2018-09-13 MED ORDER — HEPARIN SODIUM (PORCINE) 5000 UNIT/ML IJ SOLN
5000.0000 [IU] | Freq: Three times a day (TID) | INTRAMUSCULAR | Status: DC
  Administered 2018-09-13 – 2018-09-14 (×2): 5000 [IU] via SUBCUTANEOUS
  Filled 2018-09-13 (×3): qty 1

## 2018-09-13 MED ORDER — FUROSEMIDE 20 MG PO TABS: 20.0000 mg | ORAL_TABLET | Freq: Every morning | ORAL | Status: DC

## 2018-09-13 MED ORDER — AMLODIPINE BESYLATE 5 MG PO TABS: 5.0000 mg | ORAL_TABLET | Freq: Every morning | ORAL | Status: DC

## 2018-09-13 MED ORDER — SODIUM CHLORIDE 0.9 % IV SOLN: 250.0000 mL | INTRAVENOUS | Status: DC | PRN

## 2018-09-13 MED ORDER — LISINOPRIL 10 MG PO TABS: 10.0000 mg | ORAL_TABLET | Freq: Every morning | ORAL | Status: DC

## 2018-09-13 MED ORDER — ATORVASTATIN CALCIUM 10 MG PO TABS
10.0000 mg | ORAL_TABLET | Freq: Every day | ORAL | Status: DC
  Administered 2018-09-13 – 2018-09-15 (×3): 10 mg via ORAL
  Filled 2018-09-13 (×3): qty 1

## 2018-09-13 MED ORDER — TIMOLOL MALEATE 0.5 % OP SOLN
1.0000 [drp] | Freq: Two times a day (BID) | OPHTHALMIC | Status: DC
  Administered 2018-09-14 – 2018-09-16 (×5): 1 [drp] via OPHTHALMIC
  Filled 2018-09-13 (×2): qty 5

## 2018-09-13 MED ORDER — DICLOFENAC SODIUM 1 % TD GEL
4.0000 g | Freq: Four times a day (QID) | TRANSDERMAL | Status: DC
  Administered 2018-09-14 – 2018-09-16 (×9): 4 g via TOPICAL
  Filled 2018-09-13 (×2): qty 100

## 2018-09-13 MED ORDER — ADULT MULTIVITAMIN W/MINERALS CH
1.0000 | ORAL_TABLET | Freq: Every day | ORAL | Status: DC
  Administered 2018-09-14 – 2018-09-16 (×3): 1 via ORAL
  Filled 2018-09-13 (×3): qty 1

## 2018-09-13 NOTE — Telephone Encounter (Signed)
Was called personally by wife and [facility] nurse on my cell number. Discussed current symptoms and status with [facility] RN and reassured wife that ER is the only course of action. It sound like he unfortunately is having a significant exacerbation of diastolic CHF and the third spacing is causing BP to be so low. Discussed he needs urgent assessment, IV diuresis, etc. Wife somewhat calmer after call. [facility] nurse is monitoring vitals until EMS arrives. Only saturating in mid 80s with his 4L O2 so she has increased to 5L O2 for now with good saturation. HR 100 max. Will keep close watch on his ER notes.

## 2018-09-13 NOTE — Telephone Encounter (Signed)
Spoke with wife and urged her to have him reconsider. He is still refusing but is currently in right mind so there is little we can do at present. She is going to try him to reconsider this and update me.

## 2018-09-13 NOTE — ED Triage Notes (Signed)
Pt c/o new onset of SOB that began this morning. Pt d/c from hospital 4 days ago for pneumonia. Pt reports improvement in SOB after d/c. Hx of COPD and CHF. Pt wears 4L oxygen chronically. Pt stage IV pulm fibrosis. Pt given 125 mg Solu-medrol en route.
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
## 2018-09-13 NOTE — Telephone Encounter (Signed)
Joel Valdez with [facility] reports pt. Has had a 9 lb. Weight gain in 2 days. O2 sat 84%. BP 80/50. She is calling 911 for transport to ED.
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
## 2018-09-13 NOTE — Progress Notes (Signed)
Pharmacy Antibiotic Note  Joel Valdez is a 73 y.o. male admitted on 09/13/2018 with pneumonia.  Pharmacy has been consulted for Vancomycin and Cefepime dosing.  Plan: Vancomycin 1500 mg IV x 1 dose. Vancomycin 1000 mg IV every 24 hours.  Goal trough 15-20 mcg/mL.  Cefepime 2000 mg IV every 12 hours. Monitor labs, c/s, and vanco levels as indicated.  Height: 5\' 7"  (170.2 cm) Weight: 162 lb (73.5 kg) IBW/kg (Calculated) : 66.1  Temp (24hrs), Avg:98.2 F (36.8 C), Min:98.1 F (36.7 C), Max:98.3 F (36.8 C)  Recent Labs  Lab 09/13/18 1628  WBC 24.1*  CREATININE 1.59*    Estimated Creatinine Clearance: 38.7 mL/min (A) (by C-G formula based on SCr of 1.59 mg/dL (H)).    Allergies  Allergen Reactions  . Methotrexate Derivatives Diarrhea and Other (See Comments)    Weakness; Severe bone pain; No Appetite Methotrexate with preservatives    Antimicrobials this admission: Vanco 5/21 >>  Cefepime 5/21 >>   Dose adjustments this admission: Cefepime/Vanco  Microbiology results: N/A  Thank you for allowing pharmacy to be a part of this patient's care.  Ramond Craver 09/13/2018 7:36 PM

## 2018-09-13 NOTE — Telephone Encounter (Signed)
Per Levada Dy with [facility] patient refused to go to hospital.

## 2018-09-13 NOTE — Telephone Encounter (Signed)
See below. I am aware. Thank you.

## 2018-09-14 ENCOUNTER — Telehealth: Payer: Self-pay | Admitting: Physician Assistant

## 2018-09-14 DIAGNOSIS — J84112 Idiopathic pulmonary fibrosis: Secondary | ICD-10-CM

## 2018-09-14 DIAGNOSIS — J9621 Acute and chronic respiratory failure with hypoxia: Secondary | ICD-10-CM

## 2018-09-14 DIAGNOSIS — Z9981 Dependence on supplemental oxygen: Secondary | ICD-10-CM

## 2018-09-14 DIAGNOSIS — E785 Hyperlipidemia, unspecified: Secondary | ICD-10-CM

## 2018-09-14 DIAGNOSIS — I2510 Atherosclerotic heart disease of native coronary artery without angina pectoris: Secondary | ICD-10-CM

## 2018-09-14 DIAGNOSIS — J45909 Unspecified asthma, uncomplicated: Secondary | ICD-10-CM

## 2018-09-14 DIAGNOSIS — M069 Rheumatoid arthritis, unspecified: Secondary | ICD-10-CM

## 2018-09-14 DIAGNOSIS — Z87891 Personal history of nicotine dependence: Secondary | ICD-10-CM

## 2018-09-14 DIAGNOSIS — H409 Unspecified glaucoma: Secondary | ICD-10-CM

## 2018-09-14 DIAGNOSIS — I700 Atherosclerosis of aorta: Secondary | ICD-10-CM

## 2018-09-14 DIAGNOSIS — J439 Emphysema, unspecified: Secondary | ICD-10-CM | POA: Diagnosis not present

## 2018-09-14 DIAGNOSIS — J40 Bronchitis, not specified as acute or chronic: Secondary | ICD-10-CM | POA: Diagnosis not present

## 2018-09-14 DIAGNOSIS — N179 Acute kidney failure, unspecified: Secondary | ICD-10-CM

## 2018-09-14 DIAGNOSIS — K219 Gastro-esophageal reflux disease without esophagitis: Secondary | ICD-10-CM

## 2018-09-14 DIAGNOSIS — I5033 Acute on chronic diastolic (congestive) heart failure: Secondary | ICD-10-CM

## 2018-09-14 DIAGNOSIS — F419 Anxiety disorder, unspecified: Secondary | ICD-10-CM

## 2018-09-14 DIAGNOSIS — C61 Malignant neoplasm of prostate: Secondary | ICD-10-CM

## 2018-09-14 DIAGNOSIS — J8489 Other specified interstitial pulmonary diseases: Secondary | ICD-10-CM | POA: Diagnosis not present

## 2018-09-14 DIAGNOSIS — J61 Pneumoconiosis due to asbestos and other mineral fibers: Secondary | ICD-10-CM

## 2018-09-14 DIAGNOSIS — I110 Hypertensive heart disease with heart failure: Secondary | ICD-10-CM

## 2018-09-14 DIAGNOSIS — L989 Disorder of the skin and subcutaneous tissue, unspecified: Secondary | ICD-10-CM

## 2018-09-14 DIAGNOSIS — Z8701 Personal history of pneumonia (recurrent): Secondary | ICD-10-CM

## 2018-09-14 DIAGNOSIS — I2609 Other pulmonary embolism with acute cor pulmonale: Secondary | ICD-10-CM

## 2018-09-14 LAB — BASIC METABOLIC PANEL
Anion gap: 11 (ref 5–15)
BUN: 26 mg/dL — ABNORMAL HIGH (ref 8–23)
CO2: 29 mmol/L (ref 22–32)
Calcium: 8.3 mg/dL — ABNORMAL LOW (ref 8.9–10.3)
Chloride: 98 mmol/L (ref 98–111)
Creatinine, Ser: 1.26 mg/dL — ABNORMAL HIGH (ref 0.61–1.24)
GFR calc Af Amer: >60 mL/min (ref >60)
GFR calc non Af Amer: 56 mL/min — ABNORMAL LOW (ref >60)
Glucose, Bld: 148 mg/dL — ABNORMAL HIGH (ref 70–99)
Potassium: 3.2 mmol/L — ABNORMAL LOW (ref 3.5–5.1)
Sodium: 138 mmol/L (ref 135–145)

## 2018-09-14 LAB — CBC
HCT: 28 % — ABNORMAL LOW (ref 39.0–52.0)
Hemoglobin: 9.1 g/dL — ABNORMAL LOW (ref 13.0–17.0)
MCH: 28.4 pg (ref 26.0–34.0)
MCHC: 32.5 g/dL (ref 30.0–36.0)
MCV: 87.5 fL (ref 80.0–100.0)
Platelets: 337 10*3/uL (ref 150–400)
RBC: 3.2 MIL/uL — ABNORMAL LOW (ref 4.22–5.81)
RDW: 18.8 % — ABNORMAL HIGH (ref 11.5–15.5)
WBC: 13.4 10*3/uL — ABNORMAL HIGH (ref 4.0–10.5)
nRBC: 0 % (ref 0.0–0.2)

## 2018-09-14 MED ORDER — ENOXAPARIN SODIUM 40 MG/0.4ML ~~LOC~~ SOLN
40.0000 mg | SUBCUTANEOUS | Status: DC
  Administered 2018-09-14 – 2018-09-15 (×2): 40 mg via SUBCUTANEOUS
  Filled 2018-09-14 (×2): qty 0.4

## 2018-09-14 MED ORDER — SODIUM CHLORIDE 0.9 % IV SOLN
INTRAVENOUS | Status: DC
  Administered 2018-09-14 – 2018-09-16 (×3): via INTRAVENOUS

## 2018-09-14 MED ORDER — PANTOPRAZOLE SODIUM 40 MG PO TBEC: 40.0000 mg | DELAYED_RELEASE_TABLET | Freq: Every day | ORAL | Status: DC

## 2018-09-14 MED ORDER — AMLODIPINE BESYLATE 5 MG PO TABS
2.5000 mg | ORAL_TABLET | Freq: Every morning | ORAL | Status: DC
  Administered 2018-09-14: 10:00:00 2.5 mg via ORAL
  Filled 2018-09-14: qty 1

## 2018-09-14 MED ORDER — POTASSIUM CHLORIDE CRYS ER 20 MEQ PO TBCR
40.0000 meq | EXTENDED_RELEASE_TABLET | Freq: Once | ORAL | Status: AC
  Administered 2018-09-14: 17:00:00 40 meq via ORAL
  Filled 2018-09-14: qty 2

## 2018-09-14 MED ORDER — VANCOMYCIN HCL IN DEXTROSE 1-5 GM/200ML-% IV SOLN: 1000.0000 mg | INTRAVENOUS | Status: DC

## 2018-09-14 MED ORDER — FUROSEMIDE 10 MG/ML IJ SOLN
40.0000 mg | Freq: Every day | INTRAMUSCULAR | Status: DC
  Administered 2018-09-14 – 2018-09-16 (×3): 40 mg via INTRAVENOUS
  Filled 2018-09-14 (×4): qty 4

## 2018-09-14 NOTE — Progress Notes (Signed)
PROGRESS NOTE    Joel Valdez  WGN:[id number] DOB: [birth year] DOA: 09/13/2018 PCP: [name], PA-C    Brief Narrative:  74 year old male who presented with dyspnea.  He does have significant past medical history for pulmonary fibrosis, chronic hypoxic respiratory failure on home oxygen, rheumatoid arthritis, hypertension, GERD and prostate cancer.  Recent hospitalization for worsening hypoxic respiratory failure.  His initial physical examination his blood pressure was 98/55, heart rate 68, temperature 97.6, oxygen saturation was 100% on 5 L/min of supplemental oxygen per nasal cannula.  Her lungs had decreased breath sounds bilaterally with scattered rhonchi and wheezing, heart S1-S2 present and rhythmic, abdomen soft, no lower extremity edema.  Sodium 131, potassium 3.9, chloride 91, bicarb 28, glucose 114, BUN 29, creatinine 1.59, troponin less than 0.03, white count 24.1, hemoglobin 9.7, hematocrit 30.7, platelets 371.  Chest x-ray, had increased interstitial markings bilaterally, predominantly left upper lobe and right lower lobe, improved from May 6.  EKG 72 bpm, left axis deviation, sinus rhythm with normal conduction, no ST segment changes no T wave inversions.  Patient was admitted to the hospital working diagnosis of acute on chronic hypoxic respiratory failure, to rule out recurrent pneumonia.  Assessment & Plan:   Active Problems:   Acute on chronic respiratory failure with hypoxemia (HCC)   PNA (pneumonia)   1. Acute on chronic hypoxic respiratory failure, due to suspected core pulmonale exacerbation. Current oxymetry is 95% on 2 LPM,  Chest film with bilateral interstitial infiltrates, suspected acute pulmonary edema on top of pulmonary fibrosis.Echocardiogram from 25.20 (personally reviewed old records) with preserved LV systolic function, RV with mildly reduced systolic function and mildly dilated cavity. Will continue diuresis with IV furosemide to target a negative  fluid balance. Continue oxymetry monitoring and supplemental 02 per [place].   2. AKI with Hypokalemia. Renal function with persistent elevation in serum cr at 1,26 with K at 3,2, will continue K correction with Kcl and will follow renal panel in am, continue diuresis with IV furosemide, strict in and out.   2. Possible pneumonia. Doubt pulmonary infection, severely injured pulmonary parenchyma due to fibrosis and emphysema, but no signs of worsening alveolar infiltrates per his chest radiograph. Will dc vancomycin and will continue close monitoring of cell count, temperature curve and oxygen requirements. Continue on cefepime for now.   3. IPF and COPD. No increase mucous production, no significant wheezing on lung examination, 0 eosinophils, will discontinue systemic steroids for now. No clinical signs of acute exacerbation.   4. Reactive leukocytosis. WBC down to 13 from 24, will discontinue steroids and will follow on cell count in am.   5. HTN. Blood pressure 99 to 109 mmHg, will hold on amlodipine for now.   6. Depression. Continue with trazodone.    DVT prophylaxis: enoxaparin    Code Status: full Family Communication: no family at the bedside  Disposition Plan/ discharge barriers: pending clinical improvement   Body mass index is 23.96 kg/m. Malnutrition Type:      Malnutrition Characteristics:      Nutrition Interventions:     RN Pressure Injury Documentation: Pressure Injury 08/22/18 Stage II -  Partial thickness loss of dermis presenting as a shallow open ulcer with a red, pink wound bed without slough. Scabed (Active)  08/22/18 2330  Location: Buttocks  Location Orientation: Right;Medial  Staging: Stage II -  Partial thickness loss of dermis presenting as a shallow open ulcer with a red, pink wound bed without slough.  Wound Description (Comments): Scabed  Present on Admission: Yes     Consultants:     Procedures:     Antimicrobials:   Vancomycin dc  05/22  Cefepime     Subjective: Patient continue to have dyspnea, mild improvement but not at his baseline yet. Positive lower extremity edema, no nausea or vomiting, no chest pain.   Objective: Vitals:   09/13/18 2226 09/14/18 0500 09/14/18 0546 09/14/18 0720  BP: (!) 98/55  111/68   Pulse: 68  71   Resp: 16  20   Temp: 97.6 F (36.4 C)  97.8 F (36.6 C)   TempSrc: Oral  Oral   SpO2: 100%  100% 96%  Weight:  69.4 kg    Height:        Intake/Output Summary (Last 24 hours) at 09/14/2018 1144 Last data filed at 09/14/2018 0957 Gross per 24 hour  Intake 508.84 ml  Output 2125 ml  Net -1616.16 ml   Filed Weights   09/13/18 1545 09/13/18 2033 09/14/18 0500  Weight: 73.5 kg 69.1 kg 69.4 kg    Examination:   General: deconditioned and ill looking appearing  Neurology: Awake and alert, non focal  E ENT: mild pallor, no icterus, oral mucosa moist Cardiovascular: No JVD. S1-S2 present, rhythmic, no gallops, rubs, or murmurs. ++/+++ pitting lower extremity edema. Pulmonary: positive breath sounds bilaterally, very poor air movement, no wheezing or rhonchi, scattered rales bilaterally. Gastrointestinal. Abdomen with no organomegaly, non tender, no rebound or guarding Skin. No rashes Musculoskeletal: no joint deformities     Data Reviewed: I have personally reviewed following labs and imaging studies  CBC: Recent Labs  Lab 09/13/18 1628 09/14/18 0507  WBC 24.1* 13.4*  NEUTROABS 20.6*  --   HGB 9.7* 9.1*  HCT 30.7* 28.0*  MCV 88.5 87.5  PLT 371 253   Basic Metabolic Panel: Recent Labs  Lab 09/13/18 1628 09/14/18 0507  NA 131* 138  K 3.9 3.2*  CL 91* 98  CO2 28 29  GLUCOSE 114* 148*  BUN 29* 26*  CREATININE 1.59* 1.26*  CALCIUM 8.8* 8.3*   GFR: Estimated Creatinine Clearance: 48.8 mL/min (A) (by C-G formula based on SCr of 1.26 mg/dL (H)). Liver Function Tests: No results for input(s): AST, ALT, ALKPHOS, BILITOT, PROT, ALBUMIN in the last 168 hours. No  results for input(s): LIPASE, AMYLASE in the last 168 hours. No results for input(s): AMMONIA in the last 168 hours. Coagulation Profile: No results for input(s): INR, PROTIME in the last 168 hours. Cardiac Enzymes: Recent Labs  Lab 09/13/18 1628  TROPONINI <0.03   BNP (last 3 results) Recent Labs    08/29/18 1052  PROBNP 96.0   HbA1C: No results for input(s): HGBA1C in the last 72 hours. CBG: No results for input(s): GLUCAP in the last 168 hours. Lipid Profile: No results for input(s): CHOL, HDL, LDLCALC, TRIG, CHOLHDL, LDLDIRECT in the last 72 hours. Thyroid Function Tests: No results for input(s): TSH, T4TOTAL, FREET4, T3FREE, THYROIDAB in the last 72 hours. Anemia Panel: No results for input(s): VITAMINB12, FOLATE, FERRITIN, TIBC, IRON, RETICCTPCT in the last 72 hours.    Radiology Studies: I have reviewed all of the imaging during this hospital visit personally     Scheduled Meds:  amLODipine  2.5 mg Oral q morning - 10a   atorvastatin  10 mg Oral QHS   diclofenac sodium  4 g Topical QID   escitalopram  10 mg Oral Daily   folic acid  1 mg Oral q morning - 10a  guaiFENesin  600 mg Oral BID   heparin  5,000 Units Subcutaneous Q8H   ipratropium-albuterol  3 mL Nebulization TID   latanoprost  1 drop Both Eyes QHS   methylPREDNISolone (SOLU-MEDROL) injection  40 mg Intravenous Q12H   multivitamin with minerals  1 tablet Oral Daily   sodium chloride flush  3 mL Intravenous Q12H   timolol  1 drop Both Eyes BID   Continuous Infusions:  sodium chloride     sodium chloride 50 mL/hr at 09/14/18 0403   ceFEPime (MAXIPIME) IV 2 g (09/14/18 0627)     LOS: 1 day        Randie Tallarico Gerome Apley, MD

## 2018-09-14 NOTE — TOC Initial Note (Signed)
Transition of Care (TOC) - Initial/Assessment Note    Patient Details  Name: Joel Valdez MRN: [id number] Date of Birth: [birth year]  Transition of Care [facility]) CM/SW Contact:    Sherald Barge, RN Phone Number: 09/14/2018, 9:23 AM  Clinical Narrative:     Readmission. From home, lives with wife. Pt with advanced pulmonary fibrosis and now with CHF exacerbation despite frequent communication with PCP (per chart review) and medication adjustments at home. Pt active with [facility] and has home O2 through [employer]. Pt referred to [facility] at time of last DC, phone call attempt was made on 5/15 and NP requested pt return call. No notes after that. Per notes pt initially refused to come to hospital. Seen by Palliative team last admission. CM will request continuation this admission. Per pt there is nothing that could have been done differently to prevent this admission. Plans to return home with resumption of his [facility] services.   Expected Discharge Plan: [place]    Expected Discharge Plan and Services Expected Discharge Plan: [place] In-house [facility]erral: [facility]     Living arrangements for the past 2 months: [place] Agency: [facility]     Prior Living Arrangements/Services Living arrangements for the past 2 months: [place] with:: Spouse Patient language and need for interpreter reviewed:: Yes Do you feel safe going back to the place where you live?: Yes      Need for Family Participation in Patient Care: Yes (Comment) Care giver support system in place?: Yes (comment) Current home services: Home RN, Home PT Criminal Activity/Legal Involvement Pertinent to Current Situation/Hospitalization: No - Comment as needed  Activities of Daily Living Home Assistive Devices/Equipment: Hearing aid ADL Screening (condition at time of admission) Patient's cognitive  ability adequate to safely complete daily activities?: Yes Is the patient deaf or have difficulty hearing?: Yes Does the patient have difficulty seeing, even when wearing glasses/contacts?: No Does the patient have difficulty concentrating, remembering, or making decisions?: No Patient able to express need for assistance with ADLs?: Yes Does the patient have difficulty dressing or bathing?: No Independently performs ADLs?: Yes (appropriate for developmental age) Does the patient have difficulty walking or climbing stairs?: No Weakness of Legs: None Weakness of Arms/Hands: None Emotional Assessment       Orientation: : Oriented to Self, Oriented to  Time, Oriented to Place, Oriented to Situation      Admission diagnosis:  Pulmonary fibrosis ([place]) [J84.10] HCAP (healthcare-associated pneumonia) [J18.9] PNA (pneumonia) [J18.9] Patient Active Problem List   Diagnosis Date Noted  . Acute on chronic respiratory failure with hypoxemia ([place]) 09/13/2018  . PNA (pneumonia) 09/13/2018  . Acute on chronic diastolic CHF (congestive heart failure) ([place]) 09/06/2018  . Abnormal thyroid function test 08/29/2018  . Chronic respiratory failure with hypoxia ([place]) 08/29/2018  . Cor pulmonale (chronic) ([place]) 08/29/2018  . Acute prerenal azotemia 08/23/2018  . 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 Pressure injury of skin 08/23/2018  . HCAP (healthcare-associated pneumonia) 08/22/2018  . Memory loss 03/30/2018  . Hyperlipidemia 04/17/2016  . Pelvic lymphadenopathy   . Rheumatoid arthritis involving both hands ([place]) 01/26/2015  . Depression 11/21/2014  . Prostate cancer ([place]) 04/28/2014  . Bruit 11/13/2013  . Abnormal electrocardiogram 11/13/2013  . Glaucoma 09/27/2013  . Essential hypertension, benign 09/27/2013  .  Nocturia 09/27/2013  . Colon cancer screening 09/27/2013   PCP:  [name], PA-C Pharmacy:   [place], [place] - [address] [place] [address] Phone: [phone] Fax: [phone]     Social Determinants of Health ([place]) Interventions    Readmission Risk Interventions Readmission Risk Prevention Plan 09/05/2018 08/31/2018  Transportation Screening - Complete  PCP or Specialist Appt within 3-5 Days - Complete  HRI or [place] - Complete  Social Work Consult for [place] Planning/Counseling - Complete  Palliative Care Screening - Not Applicable  Medication Review Press photographer) - Complete  PCP or Specialist appointment within 3-5 days of discharge Complete -  [place] or [place] Complete -  SW Recovery Care/Counseling Consult Complete -  Palliative Care Screening Not Applicable -  [place] Not Applicable -  Some recent data might be hidden

## 2018-09-14 NOTE — Telephone Encounter (Signed)
I have placed a HH cert. And plan of care in the bin upfront with a charge sheet.  

## 2018-09-14 NOTE — Telephone Encounter (Signed)
Picked forms up front the back and faxed

## 2018-09-14 NOTE — Care Management Important Message (Signed)
Important Message  Patient Details  Name: Joel Valdez. [name] MRN: [id number] Date of Birth: July 20, 1945   Medicare Important Message Given:  Yes    Tommy Medal 09/14/2018, 4:16 PM

## 2018-09-14 NOTE — Telephone Encounter (Signed)
Form in your basket for completion

## 2018-09-14 NOTE — Telephone Encounter (Signed)
Ready for fax.

## 2018-09-14 NOTE — Telephone Encounter (Signed)
Reviewed. Placed in supervising MD bin today for signature.

## 2018-09-14 NOTE — Telephone Encounter (Signed)
Form completed and placed in basket  

## 2018-09-15 DIAGNOSIS — N179 Acute kidney failure, unspecified: Secondary | ICD-10-CM

## 2018-09-15 DIAGNOSIS — I5033 Acute on chronic diastolic (congestive) heart failure: Secondary | ICD-10-CM

## 2018-09-15 DIAGNOSIS — E876 Hypokalemia: Secondary | ICD-10-CM

## 2018-09-15 LAB — CBC WITH DIFFERENTIAL/PLATELET
Abs Immature Granulocytes: 0.32 10*3/uL — ABNORMAL HIGH (ref 0.00–0.07)
Basophils Absolute: 0 10*3/uL (ref 0.0–0.1)
Basophils Relative: 0 %
Eosinophils Absolute: 0 10*3/uL (ref 0.0–0.5)
Eosinophils Relative: 0 %
HCT: 25.9 % — ABNORMAL LOW (ref 39.0–52.0)
Hemoglobin: 8.5 g/dL — ABNORMAL LOW (ref 13.0–17.0)
Immature Granulocytes: 1 %
Lymphocytes Relative: 5 %
Lymphs Abs: 1.1 10*3/uL (ref 0.7–4.0)
MCH: 29 pg (ref 26.0–34.0)
MCHC: 32.8 g/dL (ref 30.0–36.0)
MCV: 88.4 fL (ref 80.0–100.0)
Monocytes Absolute: 1.5 10*3/uL — ABNORMAL HIGH (ref 0.1–1.0)
Monocytes Relative: 7 %
Neutro Abs: 20.1 10*3/uL — ABNORMAL HIGH (ref 1.7–7.7)
Neutrophils Relative %: 87 %
Platelets: 316 10*3/uL (ref 150–400)
RBC: 2.93 MIL/uL — ABNORMAL LOW (ref 4.22–5.81)
RDW: 19.2 % — ABNORMAL HIGH (ref 11.5–15.5)
WBC: 23 10*3/uL — ABNORMAL HIGH (ref 4.0–10.5)
nRBC: 0 % (ref 0.0–0.2)

## 2018-09-15 LAB — BASIC METABOLIC PANEL
Anion gap: 9 (ref 5–15)
BUN: 28 mg/dL — ABNORMAL HIGH (ref 8–23)
CO2: 28 mmol/L (ref 22–32)
Calcium: 8.1 mg/dL — ABNORMAL LOW (ref 8.9–10.3)
Chloride: 101 mmol/L (ref 98–111)
Creatinine, Ser: 1.2 mg/dL (ref 0.61–1.24)
GFR calc Af Amer: >60 mL/min (ref >60)
GFR calc non Af Amer: 60 mL/min — ABNORMAL LOW (ref >60)
Glucose, Bld: 149 mg/dL — ABNORMAL HIGH (ref 70–99)
Potassium: 3.2 mmol/L — ABNORMAL LOW (ref 3.5–5.1)
Sodium: 138 mmol/L (ref 135–145)

## 2018-09-15 MED ORDER — POTASSIUM CHLORIDE CRYS ER 20 MEQ PO TBCR
40.0000 meq | EXTENDED_RELEASE_TABLET | Freq: Once | ORAL | Status: AC
  Administered 2018-09-15: 11:00:00 40 meq via ORAL
  Filled 2018-09-15: qty 2

## 2018-09-15 NOTE — Progress Notes (Addendum)
PROGRESS NOTE    Joel Valdez  ERX:[id number] DOB: 12/09/1945 DOA: 09/13/2018 PCP: [name], PA-C    Brief Narrative:  73 year old male who presented with dyspnea.  He does have significant past medical history for pulmonary fibrosis, chronic hypoxic respiratory failure on home oxygen, rheumatoid arthritis, hypertension, GERD and prostate cancer.  Recent hospitalization for worsening hypoxic respiratory failure.  His initial physical examination his blood pressure was 98/55, heart rate 68, temperature 97.6, oxygen saturation was 100% on 5 L/min of supplemental oxygen per nasal cannula.  Her lungs had decreased breath sounds bilaterally with scattered rhonchi and wheezing, heart S1-S2 present and rhythmic, abdomen soft, no lower extremity edema.  Sodium 131, potassium 3.9, chloride 91, bicarb 28, glucose 114, BUN 29, creatinine 1.59, troponin less than 0.03, white count 24.1, hemoglobin 9.7, hematocrit 30.7, platelets 371.  Chest x-ray, had increased interstitial markings bilaterally, predominantly left upper lobe and right lower lobe, improved from May 6.  EKG 72 bpm, left axis deviation, sinus rhythm with normal conduction, no ST segment changes no T wave inversions.  Patient was admitted to the hospital working diagnosis of acute on chronic hypoxic respiratory failure, to rule out recurrent pneumonia.   Assessment & Plan:   Principal Problem:   Acute on chronic respiratory failure with hypoxemia (HCC) Active Problems:   Essential hypertension, benign   Prostate cancer ([place])   Interstitial pulmonary fibrosis (HCC)   Cor pulmonale (chronic) (HCC)   AKI (acute kidney injury) ([place])  1. Acute on chronic hypoxic respiratory failure, due to suspected core pulmonale exacerbation/ acute on chronic diastolic heart failure. Echocardiogram from 04.20  with preserved LV systolic function, RV with mildly reduced systolic function and mildly dilated cavity. Patient has responded well to  diuresis with improvement of his dyspnea, oxymetry has been at 97% on 2 LPM, at home he uses up to 5 LPM. Will continue diuresis with furosemide IV target negative fluid balance.   2. AKI with Hypokalemia. Renal function with serum cr trending down to 1,20 from 1,26, Will continue K correction with Kcl po 40 meq and will follow on renal panel in am.    2. Possible pneumonia. Patient continue to improve with diuresis, will hold on antibiotic therapy for now, will rule out pneumonia. Close follow up of temperature curve. Leukocytosis likely due to steroids.    3. IPF and COPD. No clinical signs of acute exacerbation, will continue oxymetry monitoring, supplemental 02 per [place] to target oxygen saturation greater than 88%. Continue bronchodilator therapy.    4. Reactive leukocytosis. Persistent elevation in WBC, suspected induced by systemic steroids, will continue to follow cell count.  5. HTN. Blood pressure systolic 92 to 950 mmHg, will continue to hold on amlodipine for now.   6. Depression. on trazodone with good toleration.     DVT prophylaxis: enoxaparin    Code Status: full Family Communication: no family at the bedside  Disposition Plan/ discharge barriers: pending clinical improvement    Body mass index is 24.34 kg/m. Malnutrition Type:      Malnutrition Characteristics:      Nutrition Interventions:     RN Pressure Injury Documentation:    Consultants:     Procedures:     Antimicrobials:       Subjective: Patient continue to feel better, with improved dyspnea, but continue to have lower extremity edema. Positive pleuritic chest pain on left hemithorax, no nausea or vomiting.   Objective: Vitals:   09/14/18 1941 09/14/18 2140 09/15/18 0525 09/15/18  0750  BP:  111/62 (!) 92/54   Pulse:  79 (!) 58   Resp:  16 16   Temp:  98.5 F (36.9 C) 98.4 F (36.9 C)   TempSrc:  Oral Oral   SpO2: 92% 96% 98% 97%  Weight:   70.5 kg   Height:         Intake/Output Summary (Last 24 hours) at 09/15/2018 1039 Last data filed at 09/15/2018 0956 Gross per 24 hour  Intake 1800.58 ml  Output 330 ml  Net 1470.58 ml   Filed Weights   09/13/18 2033 09/14/18 0500 09/15/18 0525  Weight: 69.1 kg 69.4 kg 70.5 kg    Examination:   General: deconditioned  Neurology: Awake and alert, non focal  E ENT: mild pallor, no icterus, oral mucosa moist Cardiovascular: No JVD. S1-S2 present, rhythmic, no gallops, rubs, or murmurs. ++ pitting bilateral lower extremity edema. Pulmonary: positive breath sounds bilaterally, decreased air movement, no wheezing or rhonchi, positive bilateral rales. Gastrointestinal. Abdomen with no organomegaly, non tender, no rebound or guarding Skin. No rashes Musculoskeletal: no joint deformities     Data Reviewed: I have personally reviewed following labs and imaging studies  CBC: Recent Labs  Lab 09/13/18 1628 09/14/18 0507 09/15/18 0602  WBC 24.1* 13.4* 23.0*  NEUTROABS 20.6*  --  20.1*  HGB 9.7* 9.1* 8.5*  HCT 30.7* 28.0* 25.9*  MCV 88.5 87.5 88.4  PLT 371 337 782   Basic Metabolic Panel: Recent Labs  Lab 09/13/18 1628 09/14/18 0507 09/15/18 0602  NA 131* 138 138  K 3.9 3.2* 3.2*  CL 91* 98 101  CO2 28 29 28   GLUCOSE 114* 148* 149*  BUN 29* 26* 28*  CREATININE 1.59* 1.26* 1.20  CALCIUM 8.8* 8.3* 8.1*   GFR: Estimated Creatinine Clearance: 51.3 mL/min (by C-G formula based on SCr of 1.2 mg/dL). Liver Function Tests: No results for input(s): AST, ALT, ALKPHOS, BILITOT, PROT, ALBUMIN in the last 168 hours. No results for input(s): LIPASE, AMYLASE in the last 168 hours. No results for input(s): AMMONIA in the last 168 hours. Coagulation Profile: No results for input(s): INR, PROTIME in the last 168 hours. Cardiac Enzymes: Recent Labs  Lab 09/13/18 1628  TROPONINI <0.03   BNP (last 3 results) Recent Labs    08/29/18 1052  PROBNP 96.0   HbA1C: No results for input(s): HGBA1C in the  last 72 hours. CBG: No results for input(s): GLUCAP in the last 168 hours. Lipid Profile: No results for input(s): CHOL, HDL, LDLCALC, TRIG, CHOLHDL, LDLDIRECT in the last 72 hours. Thyroid Function Tests: No results for input(s): TSH, T4TOTAL, FREET4, T3FREE, THYROIDAB in the last 72 hours. Anemia Panel: No results for input(s): VITAMINB12, FOLATE, FERRITIN, TIBC, IRON, RETICCTPCT in the last 72 hours.    Radiology Studies: I have reviewed all of the imaging during this hospital visit personally     Scheduled Meds: . atorvastatin  10 mg Oral QHS  . diclofenac sodium  4 g Topical QID  . enoxaparin (LOVENOX) injection  40 mg Subcutaneous Q24H  . escitalopram  10 mg Oral Daily  . folic acid  1 mg Oral q morning - 10a  . furosemide  40 mg Intravenous Daily  . guaiFENesin  600 mg Oral BID  . ipratropium-albuterol  3 mL Nebulization TID  . latanoprost  1 drop Both Eyes QHS  . multivitamin with minerals  1 tablet Oral Daily  . sodium chloride flush  3 mL Intravenous Q12H  . timolol  1  drop Both Eyes BID   Continuous Infusions: . sodium chloride    . sodium chloride 50 mL/hr at 09/14/18 1608  . ceFEPime (MAXIPIME) IV 2 g (09/15/18 0826)     LOS: 2 days         Gerome Apley, MD

## 2018-09-15 NOTE — Assessment & Plan Note (Signed)
With acute diastolic heart failure

## 2018-09-16 DIAGNOSIS — J8410 Pulmonary fibrosis, unspecified: Secondary | ICD-10-CM

## 2018-09-16 DIAGNOSIS — C61 Malignant neoplasm of prostate: Secondary | ICD-10-CM

## 2018-09-16 DIAGNOSIS — I10 Essential (primary) hypertension: Secondary | ICD-10-CM

## 2018-09-16 DIAGNOSIS — I2781 Cor pulmonale (chronic): Secondary | ICD-10-CM

## 2018-09-16 LAB — BASIC METABOLIC PANEL
Anion gap: 10 (ref 5–15)
BUN: 26 mg/dL — ABNORMAL HIGH (ref 8–23)
CO2: 28 mmol/L (ref 22–32)
Calcium: 7.9 mg/dL — ABNORMAL LOW (ref 8.9–10.3)
Chloride: 103 mmol/L (ref 98–111)
Creatinine, Ser: 1.23 mg/dL (ref 0.61–1.24)
GFR calc Af Amer: >60 mL/min (ref >60)
GFR calc non Af Amer: 58 mL/min — ABNORMAL LOW (ref >60)
Glucose, Bld: 77 mg/dL (ref 70–99)
Potassium: 3.6 mmol/L (ref 3.5–5.1)
Sodium: 141 mmol/L (ref 135–145)

## 2018-09-16 LAB — CBC WITH DIFFERENTIAL/PLATELET
Abs Immature Granulocytes: 0.19 10*3/uL — ABNORMAL HIGH (ref 0.00–0.07)
Basophils Absolute: 0 10*3/uL (ref 0.0–0.1)
Basophils Relative: 0 %
Eosinophils Absolute: 0.1 10*3/uL (ref 0.0–0.5)
Eosinophils Relative: 1 %
HCT: 29 % — ABNORMAL LOW (ref 39.0–52.0)
Hemoglobin: 9.1 g/dL — ABNORMAL LOW (ref 13.0–17.0)
Immature Granulocytes: 1 %
Lymphocytes Relative: 8 %
Lymphs Abs: 1.4 10*3/uL (ref 0.7–4.0)
MCH: 28.6 pg (ref 26.0–34.0)
MCHC: 31.4 g/dL (ref 30.0–36.0)
MCV: 91.2 fL (ref 80.0–100.0)
Monocytes Absolute: 0.9 10*3/uL (ref 0.1–1.0)
Monocytes Relative: 6 %
Neutro Abs: 13.4 10*3/uL — ABNORMAL HIGH (ref 1.7–7.7)
Neutrophils Relative %: 84 %
Platelets: 296 10*3/uL (ref 150–400)
RBC: 3.18 MIL/uL — ABNORMAL LOW (ref 4.22–5.81)
RDW: 20 % — ABNORMAL HIGH (ref 11.5–15.5)
WBC: 16 10*3/uL — ABNORMAL HIGH (ref 4.0–10.5)
nRBC: 0 % (ref 0.0–0.2)

## 2018-09-16 MED ORDER — FUROSEMIDE 20 MG PO TABS: 40.0000 mg | ORAL_TABLET | Freq: Every morning | ORAL | 0 refills | Status: DC

## 2018-09-16 MED ORDER — POTASSIUM CHLORIDE CRYS ER 20 MEQ PO TBCR
40.0000 meq | EXTENDED_RELEASE_TABLET | Freq: Once | ORAL | Status: AC
  Administered 2018-09-16: 11:00:00 40 meq via ORAL
  Filled 2018-09-16: qty 2

## 2018-09-16 NOTE — Plan of Care (Signed)
  Problem: Education: Goal: Knowledge of General Education information will improve Description Including pain rating scale, medication(s)/side effects and non-pharmacologic comfort measures Outcome: Adequate for Discharge   Problem: Health Behavior/Discharge Planning: Goal: Ability to manage health-related needs will improve Outcome: Adequate for Discharge   Problem: Clinical Measurements: Goal: Ability to maintain clinical measurements within normal limits will improve Outcome: Adequate for Discharge   Problem: Activity: Goal: Risk for activity intolerance will decrease Outcome: Adequate for Discharge   Problem: Nutrition: Goal: Adequate nutrition will be maintained Outcome: Adequate for Discharge   Problem: Coping: Goal: Level of anxiety will decrease Outcome: Adequate for Discharge   Problem: Elimination: Goal: Will not experience complications related to bowel motility Outcome: Adequate for Discharge   Problem: Pain Managment: Goal: General experience of comfort will improve Outcome: Progressing   Problem: Safety: Goal: Ability to remain free from injury will improve Outcome: Adequate for Discharge   Problem: Skin Integrity: Goal: Risk for impaired skin integrity will decrease Outcome: Adequate for Discharge

## 2018-09-16 NOTE — TOC Transition Note (Signed)
Transition of Care [facility]) - CM/SW Discharge Note   Patient Details  Name: Joel Valdez MRN: [id number] Date of Birth: 05-Oct-1945  Transition of Care [facility]) CM/SW Contact:  Latanya Maudlin, RN Phone Number: 09/16/2018, 10:00 AM   Clinical Narrative:  Patient to be discharged per MD order. Orders in place for home health services. Patient active with [facility]. Orders in place for resumption of care. Notified Helene Kelp of discharge. No DME needs, family to transport.      Final next level of care: Home w Home Health Services Barriers to Discharge: No Barriers Identified   Patient Goals and CMS Choice   CMS Medicare.gov Compare Post Acute Care list provided to:: Patient Choice offered to / list presented to : Patient  Discharge Placement                       Discharge Plan and Services In-house Referral: [facility]                        HH Arranged: RN, PT, OT [place] Agency: [facility] ([facility] [employer]) Date [place]: 09/16/18 Time [place]: [phone] Representative spoke with at [place]: teresa  Social Determinants of Health ([place]) Interventions     Readmission Risk Interventions Readmission Risk Prevention Plan 09/16/2018 09/05/2018 08/31/2018  Transportation Screening Complete - Complete  PCP or Specialist Appt within 3-5 Days - - Complete  HRI or [place] - - Complete  Social Work Consult for [place] Planning/Counseling - - Complete  Palliative Care Screening - - Not Applicable  Medication Review Press photographer) Complete - Complete  PCP or Specialist appointment within 3-5 days of discharge Complete Complete -  [place] or Home Care Consult Complete Complete -  SW Recovery Care/Counseling Consult Complete Complete -  Palliative Care Screening Not Applicable Not Applicable -  [place] Not Applicable Not Applicable -  Some recent data might be hidden

## 2018-09-16 NOTE — Progress Notes (Signed)
Called Physical Therapy and they are unable to come to back to the hospital to evaluate patient for discharge. Will contact the doctor and let him know to make decision to continue with discharge or not.

## 2018-09-16 NOTE — Discharge Summary (Signed)
Physician Discharge Summary  Joel Valdez. Joel Valdez:[id number] DOB: Aug 12, 1945 DOA: 09/13/2018  PCP: [name], PA-C  Admit date: 09/13/2018 Discharge date: 09/16/2018  Admitted From: Home  Disposition:  Home   Recommendations for Outpatient Follow-up and new medication changes:  1. Follow up with Raiford Noble PA-C in 7 days.  2. Furosemide has been increased to 40 mg daily.  3. Please follow BMP next week.   Home Health: no   Equipment/Devices: Home 02   Discharge Condition: stable  CODE STATUS: full  Diet recommendation: heart healthy   Brief/Interim Summary: 73 year old male who presented with dyspnea. He does have significant past medical history for pulmonary fibrosis, chronic hypoxic respiratory failure on home oxygen, rheumatoid arthritis, hypertension, GERD and prostate cancer. Recent hospitalization for worsening hypoxic respiratory failure. On hs initial physical examination his blood pressure was 98/55, heart rate 68, temperature 97.6, oxygen saturation was 100% on 5 L/min of supplemental oxygen per nasal cannula. His lungs had decreased breath sounds bilaterally with scattered rhonchi and wheezing,heart S1-S2 present and rhythmic, abdomen soft an and nontender, +++ pitting lower extremity edema. Sodium 131, potassium 3.9, chloride 91, bicarb 28, glucose 114, BUN 29, creatinine 1.59, troponin less than 0.03, white count 24.1, hemoglobin 9.7, hematocrit 30.7, platelets 371.Chest x-ray, had increased interstitial markings bilaterally, predominantly left upper lobe and right lower lobe, improved from May 6.EKG 72 bpm, left axis deviation, sinus rhythm with normal conduction, no ST segment changes no T wave inversions.  Patient was admitted to the hospital working diagnosis of acute on chronic hypoxic respiratory failure,to rule out recurrent pneumonia.  1.  Acute on chronic hypoxic respiratory failure due to core pulmonale exacerbation, acute on chronic diastolic  heart failure.  Patient was admitted to the medical ward, he was placed on remote telemetry monitor, received aggressive diuresis with IV furosemide, negative fluid balance was achieved, with significant improvement of his symptoms.  His oxygen requirements decreased down to 2 L/min per nasal cannula with an oxygen saturation of 97%.  His dyspnea has improved.  Patient will be discharged on an increased dose of furosemide to 40 mg daily.  Will need close follow-up as an outpatient.  2.  Pneumonia was ruled out.  Initially patient was placed on broad spectrum antibiotics, his radiographic changes were likely related to pulmonary edema on top of pulmonary fibrosis.  He had no fever, cough or increased mucus production.  SARS COVID-19 was negative.  His antibiotics were discontinued with good toleration.  3.  Acute kidney injury with hypokalemia.  Patient was placed on IV furosemide, his kidney function improved with a discharge creatinine 1.23, patient will continue taking furosemide 40 mg daily.  Recommend follow-up renal panel within next 7 days to follow-up on electrolytes and renal function.  He may need potassium supplements.   4.  IPF and COPD.  Patient will continue bronchodilator therapy at home, continue with supplemental oxygen per nasal cannula.  No signs of acute exacerbation.  5.  Reactive leukocytosis.  No signs of systemic infection, leukocytosis likely related to steroids, his discharge white cell count 16.   6.  Hypertension.  His antihypertensive agents were held during his hospitalization, at discharge patient will resume amlodipine.  7.  Depression.  Continue trazodone.  8. Prostate cancer. Continue lueprolide as outpatient.   Discharge Diagnoses:  Principal Problem:   Acute on chronic respiratory failure with hypoxemia (HCC) Active Problems:   Essential hypertension, benign   Prostate cancer ([place])   Interstitial pulmonary fibrosis ([place])  Cor pulmonale (chronic) (HCC)   AKI  (acute kidney injury) ([place])    Discharge Instructions   Allergies as of 09/16/2018      Reactions   Methotrexate Derivatives Diarrhea, Other (See Comments)   Weakness; Severe bone pain; No Appetite Methotrexate with preservatives      Medication List    STOP taking these medications   azithromycin 250 MG tablet Commonly known as:  ZITHROMAX   predniSONE 10 MG tablet Commonly known as:  DELTASONE     TAKE these medications   albuterol 108 (90 Base) MCG/ACT inhaler Commonly known as:  VENTOLIN HFA Inhale 2 puffs into the lungs every 6 (six) hours as needed for wheezing or shortness of breath.   amLODipine 5 MG tablet Commonly known as:  NORVASC Take 5 mg by mouth every morning.   atorvastatin 10 MG tablet Commonly known as:  LIPITOR Take 1 tablet (10 mg total) by mouth at bedtime.   chlorpheniramine-HYDROcodone 10-8 MG/5ML Suer Commonly known as:  TUSSIONEX Take 5 mLs by mouth every 12 (twelve) hours as needed for cough.   diclofenac sodium 1 % Gel Commonly known as:  VOLTAREN Apply 4 g topically 4 (four) times daily. What changed:    when to take this  reasons to take this   escitalopram 20 MG tablet Commonly known as:  LEXAPRO Take 0.5 tablets (10 mg total) by mouth daily. What changed:  when to take this   folic acid 1 MG tablet Commonly known as:  FOLVITE Take 1 tablet by mouth every morning.   furosemide 20 MG tablet Commonly known as:  LASIX Take 2 tablets (40 mg total) by mouth every morning for 30 days. What changed:  how much to take   HYDROcodone-acetaminophen 5-325 MG tablet Commonly known as:  NORCO/VICODIN Take 1 tablet by mouth every 6 (six) hours as needed for moderate pain or severe pain.   Combivent Respimat 20-100 MCG/ACT Aers respimat Generic drug:  Ipratropium-Albuterol Inhale 1 puff into the lungs 2 (two) times a day.   ipratropium-albuterol 0.5-2.5 (3) MG/3ML Soln Commonly known as:  DUONEB Take 3 mLs by nebulization every 6  (six) hours as needed for up to 30 days (shortness of breath or wheezing).   latanoprost 0.005 % ophthalmic solution Commonly known as:  XALATAN Place 1 drop into both eyes at bedtime.   leuprolide 30 MG injection Commonly known as:  LUPRON Inject 30 mg into the muscle every 3 (three) months.   lisinopril 10 MG tablet Commonly known as:  ZESTRIL Take 10 mg by mouth every morning.   multivitamin with minerals Tabs tablet Take 1 tablet by mouth daily. What changed:  when to take this   OXYGEN Inhale 4 L into the lungs continuous.   timolol 0.5 % ophthalmic solution Commonly known as:  BETIMOL Place 1 drop into both eyes 2 (two) times daily.       Allergies  Allergen Reactions  . Methotrexate Derivatives Diarrhea and Other (See Comments)    Weakness; Severe bone pain; No Appetite Methotrexate with preservatives    Consultations:     Procedures/Studies: Dg Chest 2 View  Result Date: 08/29/2018 CLINICAL DATA:  Dyspnea on exertion. EXAM: CHEST - 2 VIEW COMPARISON:  08/21/2018 and CT 08/22/2018 FINDINGS: Lungs are hypoinflated demonstrate bilateral coarse interstitial disease compatible with known fibrosis. No new airspace process or effusion. Cardiomediastinal silhouette and remainder of the exam is unchanged. IMPRESSION: No acute findings. Stable bilateral interstitial disease. Electronically Signed   By: Quillian Quince  Derrel Nip M.D.   On: 08/29/2018 15:30   Dg Chest 2 View  Result Date: 08/21/2018 CLINICAL DATA:  Chest pain. EXAM: CHEST - 2 VIEW COMPARISON:  CT scan of August 09, 2018. Radiograph of August 06, 2018. FINDINGS: Stable cardiomediastinal silhouette. Atherosclerosis of thoracic aorta is noted. No pneumothorax or pleural effusion is noted. Stable coarse reticular interstitial densities are noted throughout both lungs most consistent with scarring or fibrosis. No acute abnormality is noted. Bony thorax is unremarkable. IMPRESSION: Stable coarse reticular densities are noted  throughout both lungs most consistent with scarring or fibrosis. No acute abnormality is noted. Aortic Atherosclerosis (ICD10-I70.0). Electronically Signed   By: Marijo Conception M.D.   On: 08/21/2018 15:12   Ct Angio Chest Pe W And/or Wo Contrast  Result Date: 08/30/2018 CLINICAL DATA:  73 year old male with shortness of breath EXAM: CT ANGIOGRAPHY CHEST WITH CONTRAST TECHNIQUE: Multidetector CT imaging of the chest was performed using the standard protocol during bolus administration of intravenous contrast. Multiplanar CT image reconstructions and MIPs were obtained to evaluate the vascular anatomy. CONTRAST:  168mL OMNIPAQUE IOHEXOL 350 MG/ML SOLN COMPARISON:  CT 08/22/2018, 08/09/2018 FINDINGS: Cardiovascular: Heart: Similar appearance of the heart size with cardiomegaly. No pericardial fluid/thickening . Calcifications of left main, left anterior descending, right coronary arteries. Aorta: Unremarkable course, caliber, contour of the thoracic aorta. No aneurysm or dissection flap. No periaortic fluid. Calcifications of the aortic arch. Pulmonary arteries: No central, lobar, segmental, or proximal subsegmental filling defects. Main pulmonary diameter measures 3.0 cm Mediastinum/Nodes: No mediastinal adenopathy. Unremarkable appearance of the thoracic esophagus. Unremarkable thoracic inlet Lungs/Pleura: Redemonstration of background of centrilobular and paraseptal emphysema with architectural distortion and a pattern of subpleural reticulation/honeycomb pattern of the bilateral lungs, predominance of the lower lobes. Interlobular septal thickening. New trace left pleural fluid compared to the prior CT. Slight increased right pleural fluid at the fissure compared to the prior CT. No endotracheal or endobronchial debris. Bronchial wall thickening is similar to prior. Overall, there is slight increased interstitial opacities. Upper Abdomen: No acute. Musculoskeletal: No acute displaced fracture. Degenerative  changes of the spine. Review of the MIP images confirms the above findings. IMPRESSION: Redemonstration of advanced interstitial fibrosis, with likely superimposed acute bronchitis/atypical infection given that there is slight increased prominence of the interstitial opacities, enlarging right pleural effusion, and new left pleural effusion. No evidence of overt edema. Aortic atherosclerosis and coronary artery disease. Aortic Atherosclerosis (ICD10-I70.0). Electronically Signed   By: Corrie Mckusick D.O.   On: 08/30/2018 15:31   Ct Angio Chest Pe W And/or Wo Contrast  Result Date: 08/22/2018 CLINICAL DATA:  Chest pain and shortness of breath. Elevated D-dimer. High clinical probability for pulmonary embolism. EXAM: CT ANGIOGRAPHY CHEST WITH CONTRAST TECHNIQUE: Multidetector CT imaging of the chest was performed using the standard protocol during bolus administration of intravenous contrast. Multiplanar CT image reconstructions and MIPs were obtained to evaluate the vascular anatomy. CONTRAST:  125mL OMNIPAQUE IOHEXOL 350 MG/ML SOLN COMPARISON:  08/09/2018 from [place]: Cardiovascular: Satisfactory opacification of pulmonary arteries noted, and no pulmonary emboli identified. No evidence of thoracic aortic dissection or aneurysm. Mild cardiomegaly. Aortic and coronary artery atherosclerosis. Mediastinum/Nodes: No masses or pathologically enlarged lymph nodes identified. Lungs/Pleura: Severe chronic pulmonary interstitial fibrosis again seen with honeycombing and traction bronchiectasis which is most severe in the left upper lobe. Previously seen areas of parenchymal consolidation in the right mid and lower lung are decreased since previous study, consistent with resolving superimposed infectious or inflammatory process. No  new or worsening areas of pulmonary opacity are identified. No evidence of pneumothorax or pleural effusion. Upper abdomen: No acute findings. Musculoskeletal: No  suspicious bone lesions identified. Review of the MIP images confirms the above findings. IMPRESSION: 1. No evidence of pulmonary embolism or other acute findings. 2. Severe chronic pulmonary interstitial fibrosis. 3. Resolving right mid and lower lung airspace opacity, consistent with resolving superimposed infectious or inflammatory process. 4. Aortic and coronary artery atherosclerosis. Electronically Signed   By: Earle Gell M.D.   On: 08/22/2018 20:20   US Venous Img Lower Unilateral Right  Result Date: 08/31/2018 CLINICAL DATA:  Right lower extremity edema. History of prostate cancer. Evaluate for DVT. EXAM: RIGHT LOWER EXTREMITY VENOUS DOPPLER ULTRASOUND TECHNIQUE: Gray-scale sonography with graded compression, as well as color Doppler and duplex ultrasound were performed to evaluate the lower extremity deep venous systems from the level of the common femoral vein and including the common femoral, femoral, profunda femoral, popliteal and calf veins including the posterior tibial, peroneal and gastrocnemius veins when visible. The superficial great saphenous vein was also interrogated. Spectral Doppler was utilized to evaluate flow at rest and with distal augmentation maneuvers in the common femoral, femoral and popliteal veins. COMPARISON:  None. FINDINGS: Contralateral Common Femoral Vein: Respiratory phasicity is normal and symmetric with the symptomatic side. No evidence of thrombus. Normal compressibility. Common Femoral Vein: No evidence of thrombus. Normal compressibility, respiratory phasicity and response to augmentation. Saphenofemoral Junction: No evidence of thrombus. Normal compressibility and flow on color Doppler imaging. Profunda Femoral Vein: No evidence of thrombus. Normal compressibility and flow on color Doppler imaging. Femoral Vein: No evidence of thrombus. Normal compressibility, respiratory phasicity and response to augmentation. Popliteal Vein: No evidence of thrombus. Normal  compressibility, respiratory phasicity and response to augmentation. Calf Veins: No evidence of thrombus. Normal compressibility and flow on color Doppler imaging. Superficial Great Saphenous Vein: No evidence of thrombus. Normal compressibility. Venous Reflux:  None. Other Findings: A minimal subcutaneous edema is noted at the level right lower leg and calf. IMPRESSION: No evidence DVT within the right lower extremity. Electronically Signed   By: Sandi Mariscal M.D.   On: 08/31/2018 09:19   Dg Chest Port 1 View  Result Date: 09/13/2018 CLINICAL DATA:  73 year old male with a history of shortness of breath EXAM: PORTABLE CHEST 1 VIEW COMPARISON:  08/30/2018, CT 08/30/2018 FINDINGS: Cardiomediastinal silhouette unchanged in size and contour. No pneumothorax. Low lung volumes with reticulonodular opacities throughout the lungs with superimposed architectural distortion, similar to the comparison chest x-ray. Overall aeration is similar to the comparison. No large pleural effusion.  No displaced fracture IMPRESSION: Similar appearance of the chest x-ray, with reticulonodular opacities likely representing multifocal infection superimposed on interstitial fibrosis. Electronically Signed   By: Corrie Mckusick D.O.   On: 09/13/2018 16:26   Dg Chest Portable 1 View  Result Date: 08/30/2018 CLINICAL DATA:  Shortness of breath. History of smoking and pulmonary fibrosis. EXAM: PORTABLE CHEST 1 VIEW COMPARISON:  08/29/2018 FINDINGS: The cardiomediastinal silhouette is unchanged. Aortic atherosclerosis is noted. Lung volumes remain mildly low with chronic coarse interstitial opacities again noted throughout both lungs. No definite acute airspace opacity is identified allowing for portable AP technique today compared to two view upright imaging yesterday. No sizable pleural effusion or pneumothorax is identified. No acute osseous abnormality is seen. IMPRESSION: Chronic interstitial lung disease without evidence of acute  abnormality. Electronically Signed   By: Logan Bores M.D.   On: 08/30/2018 12:24  Procedures:   Subjective: Patient is feeling better, dyspnea has improved and he is very anxious to go home. Continue improving lower extremity edema. No nausea or vomiting, no chest pain.   Discharge Exam: Vitals:   09/16/18 0550 09/16/18 0732  BP: 133/76   Pulse: 73   Resp: 18   Temp: 98.3 F (36.8 C)   SpO2: 99% 97%   Vitals:   09/15/18 1925 09/15/18 2158 09/16/18 0550 09/16/18 0732  BP:  123/67 133/76   Pulse:  82 73   Resp:  18 18   Temp:  (!) 97.5 F (36.4 C) 98.3 F (36.8 C)   TempSrc:  Oral Oral   SpO2: 95% 95% 99% 97%  Weight:   69.2 kg   Height:        General: Not in pain or dyspnea  Neurology: Awake and alert, non focal  E ENT: mild pallor, no icterus, oral mucosa moist Cardiovascular: No JVD. S1-S2 present, rhythmic, no gallops, rubs, or murmurs. +/++ pitting bilateral lower extremity edema. Pulmonary: positive breath sounds bilaterally, no wheezing, or rhonchi, positive bibasilar rales. Gastrointestinal. Abdomen with, no organomegaly, non tender, no rebound or guarding Skin. No rashes Musculoskeletal: no joint deformities   The results of significant diagnostics from this hospitalization (including imaging, microbiology, ancillary and laboratory) are listed below for reference.     Microbiology: Recent Results (from the past 240 hour(s))  SARS Coronavirus 2 (CEPHEID- Performed in [facility] hospital lab), Hosp Order     Status: None   Collection Time: 09/13/18  5:40 PM  Result Value Ref Range Status   SARS Coronavirus 2 NEGATIVE NEGATIVE Final    Comment: (NOTE) If result is NEGATIVE SARS-CoV-2 target nucleic acids are NOT DETECTED. The SARS-CoV-2 RNA is generally detectable in upper and lower  respiratory specimens during the acute phase of infection. The lowest  concentration of SARS-CoV-2 viral copies this assay can detect is 250  copies / mL. A negative  result does not preclude SARS-CoV-2 infection  and should not be used as the sole basis for treatment or other  patient management decisions.  A negative result may occur with  improper specimen collection / handling, submission of specimen other  than nasopharyngeal swab, presence of viral mutation(s) within the  areas targeted by this assay, and inadequate number of viral copies  (<250 copies / mL). A negative result must be combined with clinical  observations, patient history, and epidemiological information. If result is POSITIVE SARS-CoV-2 target nucleic acids are DETECTED. The SARS-CoV-2 RNA is generally detectable in upper and lower  respiratory specimens dur ing the acute phase of infection.  Positive  results are indicative of active infection with SARS-CoV-2.  Clinical  correlation with patient history and other diagnostic information is  necessary to determine patient infection status.  Positive results do  not rule out bacterial infection or co-infection with other viruses. If result is PRESUMPTIVE POSTIVE SARS-CoV-2 nucleic acids MAY BE PRESENT.   A presumptive positive result was obtained on the submitted specimen  and confirmed on repeat testing.  While 2019 novel coronavirus  (SARS-CoV-2) nucleic acids may be present in the submitted sample  additional confirmatory testing may be necessary for epidemiological  and / or clinical management purposes  to differentiate between  SARS-CoV-2 and other Sarbecovirus currently known to infect humans.  If clinically indicated additional testing with an alternate test  methodology [phone]) is advised. The SARS-CoV-2 RNA is generally  detectable in upper and lower respiratory sp ecimens during  the acute  phase of infection. The expected result is Negative. Fact Sheet for Patients:  StrictlyIdeas.no Fact Sheet for Healthcare Providers: BankingDealers.co.za This test is not yet  approved or cleared by the [place] FDA and has been authorized for detection and/or diagnosis of SARS-CoV-2 by FDA under an Emergency Use Authorization (EUA).  This EUA will remain in effect (meaning this test can be used) for the duration of the COVID-19 declaration under Section 564(b)(1) of the Act, 21 U.S.C. section 360bbb-3(b)(1), unless the authorization is terminated or revoked sooner. Performed at [facility], [address] [place], [place] [address]      Labs: BNP (last 3 results) Recent Labs    07/30/18 1221 08/30/18 1232  BNP 94.0 21.1   Basic Metabolic Panel: Recent Labs  Lab 09/13/18 1628 09/14/18 0507 09/15/18 0602 09/16/18 0423  NA 131* 138 138 141  K 3.9 3.2* 3.2* 3.6  CL 91* 98 101 103  CO2 28 29 28 28   GLUCOSE 114* 148* 149* 77  BUN 29* 26* 28* 26*  CREATININE 1.59* 1.26* 1.20 1.23  CALCIUM 8.8* 8.3* 8.1* 7.9*   Liver Function Tests: No results for input(s): AST, ALT, ALKPHOS, BILITOT, PROT, ALBUMIN in the last 168 hours. No results for input(s): LIPASE, AMYLASE in the last 168 hours. No results for input(s): AMMONIA in the last 168 hours. CBC: Recent Labs  Lab 09/13/18 1628 09/14/18 0507 09/15/18 0602 09/16/18 0423  WBC 24.1* 13.4* 23.0* 16.0*  NEUTROABS 20.6*  --  20.1* 13.4*  HGB 9.7* 9.1* 8.5* 9.1*  HCT 30.7* 28.0* 25.9* 29.0*  MCV 88.5 87.5 88.4 91.2  PLT 371 337 316 296   Cardiac Enzymes: Recent Labs  Lab 09/13/18 1628  TROPONINI <0.03   BNP: Invalid input(s): POCBNP CBG: No results for input(s): GLUCAP in the last 168 hours. D-Dimer No results for input(s): DDIMER in the last 72 hours. Hgb A1c No results for input(s): HGBA1C in the last 72 hours. Lipid Profile No results for input(s): CHOL, HDL, LDLCALC, TRIG, CHOLHDL, LDLDIRECT in the last 72 hours. Thyroid function studies No results for input(s): TSH, T4TOTAL, T3FREE, THYROIDAB in the last 72 hours.  Invalid input(s): FREET3 Anemia work up No results for  input(s): VITAMINB12, FOLATE, FERRITIN, TIBC, IRON, RETICCTPCT in the last 72 hours. Urinalysis    Component Value Date/Time   COLORURINE YELLOW 07/30/2018 1224   APPEARANCEUR CLEAR 07/30/2018 1224   LABSPEC 1.015 07/30/2018 1224   PHURINE 5.0 07/30/2018 1224   GLUCOSEU NEGATIVE 07/30/2018 1224   GLUCOSEU NEGATIVE 11/04/2015 0948   HGBUR MODERATE (A) 07/30/2018 1224   BILIRUBINUR NEGATIVE 07/30/2018 1224   BILIRUBINUR NEG 09/24/2013 1412   KETONESUR NEGATIVE 07/30/2018 1224   PROTEINUR NEGATIVE 07/30/2018 1224   UROBILINOGEN 0.2 11/04/2015 0948   NITRITE NEGATIVE 07/30/2018 1224   LEUKOCYTESUR NEGATIVE 07/30/2018 1224   Sepsis Labs Invalid input(s): PROCALCITONIN,  WBC,  LACTICIDVEN Microbiology Recent Results (from the past 240 hour(s))  SARS Coronavirus 2 (CEPHEID- Performed in [place] hospital lab), Hosp Order     Status: None   Collection Time: 09/13/18  5:40 PM  Result Value Ref Range Status   SARS Coronavirus 2 NEGATIVE NEGATIVE Final    Comment: (NOTE) If result is NEGATIVE SARS-CoV-2 target nucleic acids are NOT DETECTED. The SARS-CoV-2 RNA is generally detectable in upper and lower  respiratory specimens during the acute phase of infection. The lowest  concentration of SARS-CoV-2 viral copies this assay can detect is 250  copies / mL. A negative  result does not preclude SARS-CoV-2 infection  and should not be used as the sole basis for treatment or other  patient management decisions.  A negative result may occur with  improper specimen collection / handling, submission of specimen other  than nasopharyngeal swab, presence of viral mutation(s) within the  areas targeted by this assay, and inadequate number of viral copies  (<250 copies / mL). A negative result must be combined with clinical  observations, patient history, and epidemiological information. If result is POSITIVE SARS-CoV-2 target nucleic acids are DETECTED. The SARS-CoV-2 RNA is generally  detectable in upper and lower  respiratory specimens dur ing the acute phase of infection.  Positive  results are indicative of active infection with SARS-CoV-2.  Clinical  correlation with patient history and other diagnostic information is  necessary to determine patient infection status.  Positive results do  not rule out bacterial infection or co-infection with other viruses. If result is PRESUMPTIVE POSTIVE SARS-CoV-2 nucleic acids MAY BE PRESENT.   A presumptive positive result was obtained on the submitted specimen  and confirmed on repeat testing.  While 2019 novel coronavirus  (SARS-CoV-2) nucleic acids may be present in the submitted sample  additional confirmatory testing may be necessary for epidemiological  and / or clinical management purposes  to differentiate between  SARS-CoV-2 and other Sarbecovirus currently known to infect humans.  If clinically indicated additional testing with an alternate test  methodology [phone]) is advised. The SARS-CoV-2 RNA is generally  detectable in upper and lower respiratory sp ecimens during the acute  phase of infection. The expected result is Negative. Fact Sheet for Patients:  StrictlyIdeas.no Fact Sheet for Healthcare Providers: BankingDealers.co.za This test is not yet approved or cleared by the [place] FDA and has been authorized for detection and/or diagnosis of SARS-CoV-2 by FDA under an Emergency Use Authorization (EUA).  This EUA will remain in effect (meaning this test can be used) for the duration of the COVID-19 declaration under Section 564(b)(1) of the Act, 21 U.S.C. section 360bbb-3(b)(1), unless the authorization is terminated or revoked sooner. Performed at [facility], [address] [place], [place] [address]      Time coordinating discharge: 45 minutes  SIGNED:   Tawni Millers, MD  [facility] 09/16/2018, 9:08 AM

## 2018-09-16 NOTE — Progress Notes (Signed)
Spoke with MD and he advised that it was safe to discharge patient without a physical therapy evaluation. Patient discharged home today per MD orders. Patient vital signs WDL. IV removed and site WDL. Discharge Instructions including follow up appointments, medications, and education reviewed with patient.

## 2018-09-19 ENCOUNTER — Other Ambulatory Visit: Payer: Self-pay

## 2018-09-19 ENCOUNTER — Telehealth: Payer: Self-pay | Admitting: Physician Assistant

## 2018-09-19 ENCOUNTER — Ambulatory Visit (INDEPENDENT_AMBULATORY_CARE_PROVIDER_SITE_OTHER): Payer: Medicare Other | Admitting: Physician Assistant

## 2018-09-19 ENCOUNTER — Other Ambulatory Visit: Payer: Self-pay | Admitting: *Deleted

## 2018-09-19 ENCOUNTER — Encounter: Payer: Self-pay | Admitting: Physician Assistant

## 2018-09-19 ENCOUNTER — Telehealth: Payer: Self-pay

## 2018-09-19 VITALS — BP 87/51 | HR 99 | Temp 98.2°F | Wt 144.4 lb

## 2018-09-19 DIAGNOSIS — I5033 Acute on chronic diastolic (congestive) heart failure: Secondary | ICD-10-CM | POA: Diagnosis not present

## 2018-09-19 DIAGNOSIS — N179 Acute kidney failure, unspecified: Secondary | ICD-10-CM | POA: Diagnosis not present

## 2018-09-19 DIAGNOSIS — J9611 Chronic respiratory failure with hypoxia: Secondary | ICD-10-CM | POA: Diagnosis not present

## 2018-09-19 DIAGNOSIS — J8410 Pulmonary fibrosis, unspecified: Secondary | ICD-10-CM

## 2018-09-19 DIAGNOSIS — I10 Essential (primary) hypertension: Secondary | ICD-10-CM | POA: Diagnosis not present

## 2018-09-19 DIAGNOSIS — J449 Chronic obstructive pulmonary disease, unspecified: Secondary | ICD-10-CM | POA: Diagnosis not present

## 2018-09-19 DIAGNOSIS — E785 Hyperlipidemia, unspecified: Secondary | ICD-10-CM | POA: Diagnosis not present

## 2018-09-19 DIAGNOSIS — J9621 Acute and chronic respiratory failure with hypoxia: Secondary | ICD-10-CM | POA: Diagnosis not present

## 2018-09-19 NOTE — Patient Outreach (Signed)
Joel Valdez has discharged home. I had previously called to make transition of care calls but not realize that this pt does have a Financial controller at [facility] primary care. This office is performing their own transition of care calls.  I have called to speak with Joel Valdez or Joel Valdez to advise I am available for future care management. I have also noted in a previousl call today that Joel Valdez was inquiring about Hospice services.  I have left a message and expect to hear back today from one of these ladies.  Joel Valdez returned my call and I updated her on my previous attempted engagement and advising that I will not be involved since they are doing transition of care calls themselves. She advised that they will be making a referral to Hospice. Requested if they would let me know if the referral and admission to Hospice is completed.  Joel Valdez. Joel Neither, MSN, [facility] Gerontological Nurse Practitioner [facility] Care Management [phone]

## 2018-09-19 NOTE — Telephone Encounter (Signed)
Spoke with patients wife, [name].  Patient continues to be weak and sleep often. Appetite okay, drinking little. [facility] to continue nursing care, not sure of next visit. Wife has questions about Hospice. Scheduled for hosp f/u (telephone call) with PCP today.

## 2018-09-19 NOTE — Telephone Encounter (Signed)
Please call [place] RN to drawn BMP and lipid panel tomorrow at visit.

## 2018-09-19 NOTE — Progress Notes (Signed)
I have discussed the procedure for the virtual visit with the patient who has given consent to proceed with assessment and treatment.   Jann Ra S Abbagayle Zaragoza, CMA     

## 2018-09-20 ENCOUNTER — Other Ambulatory Visit: Payer: Self-pay | Admitting: Physician Assistant

## 2018-09-20 MED ORDER — HYDROCODONE-ACETAMINOPHEN 5-325 MG PO TABS: 1.0000 | ORAL_TABLET | Freq: Four times a day (QID) | ORAL | 0 refills | Status: DC | PRN

## 2018-09-20 NOTE — Telephone Encounter (Signed)
Melissa from [facility] called back requesting verbal orders for restart of care since patient was discharged from the hospital.  Verbal orders given.

## 2018-09-20 NOTE — Telephone Encounter (Signed)
[place] to give verbal orders for the home health RN to draw blood on patient.  Unable to talk to Nurse manager but left message on VM.

## 2018-09-21 ENCOUNTER — Emergency Department (HOSPITAL_COMMUNITY): Payer: Medicare Other

## 2018-09-21 ENCOUNTER — Encounter (HOSPITAL_COMMUNITY): Payer: Self-pay | Admitting: Radiology

## 2018-09-21 ENCOUNTER — Observation Stay (HOSPITAL_COMMUNITY)
Admission: EM | Admit: 2018-09-21 | Discharge: 2018-09-23 | Disposition: A | Discharge status: Home / Self Care | Payer: Medicare Other | Attending: Family Medicine | Admitting: Family Medicine

## 2018-09-21 DIAGNOSIS — R0602 Shortness of breath: Secondary | ICD-10-CM | POA: Diagnosis not present

## 2018-09-21 DIAGNOSIS — Z8546 Personal history of malignant neoplasm of prostate: Secondary | ICD-10-CM | POA: Diagnosis not present

## 2018-09-21 DIAGNOSIS — J8410 Pulmonary fibrosis, unspecified: Secondary | ICD-10-CM | POA: Insufficient documentation

## 2018-09-21 DIAGNOSIS — J9621 Acute and chronic respiratory failure with hypoxia: Secondary | ICD-10-CM

## 2018-09-21 DIAGNOSIS — J9601 Acute respiratory failure with hypoxia: Secondary | ICD-10-CM | POA: Insufficient documentation

## 2018-09-21 DIAGNOSIS — J45909 Unspecified asthma, uncomplicated: Secondary | ICD-10-CM | POA: Insufficient documentation

## 2018-09-21 DIAGNOSIS — Z20828 Contact with and (suspected) exposure to other viral communicable diseases: Secondary | ICD-10-CM | POA: Diagnosis not present

## 2018-09-21 DIAGNOSIS — I959 Hypotension, unspecified: Secondary | ICD-10-CM | POA: Diagnosis not present

## 2018-09-21 DIAGNOSIS — I10 Essential (primary) hypertension: Secondary | ICD-10-CM | POA: Insufficient documentation

## 2018-09-21 DIAGNOSIS — R0689 Other abnormalities of breathing: Secondary | ICD-10-CM | POA: Diagnosis not present

## 2018-09-21 DIAGNOSIS — N179 Acute kidney failure, unspecified: Secondary | ICD-10-CM | POA: Diagnosis not present

## 2018-09-21 DIAGNOSIS — R000 Tachycardia, unspecified: Secondary | ICD-10-CM | POA: Diagnosis not present

## 2018-09-21 LAB — CBC
HCT: 31.9 % — ABNORMAL LOW (ref 39.0–52.0)
Hemoglobin: 10.3 g/dL — ABNORMAL LOW (ref 13.0–17.0)
MCH: 28.3 pg (ref 26.0–34.0)
MCHC: 32.3 g/dL (ref 30.0–36.0)
MCV: 87.6 fL (ref 80.0–100.0)
Platelets: 286 10*3/uL (ref 150–400)
RBC: 3.64 MIL/uL — ABNORMAL LOW (ref 4.22–5.81)
RDW: 19.2 % — ABNORMAL HIGH (ref 11.5–15.5)
WBC: 12.2 10*3/uL — ABNORMAL HIGH (ref 4.0–10.5)
nRBC: 0 % (ref 0.0–0.2)

## 2018-09-21 LAB — COMPREHENSIVE METABOLIC PANEL
ALT: 26 U/L (ref 0–44)
AST: 20 U/L (ref 15–41)
Albumin: 3 g/dL — ABNORMAL LOW (ref 3.5–5.0)
Alkaline Phosphatase: 52 U/L (ref 38–126)
Anion gap: 17 — ABNORMAL HIGH (ref 5–15)
BUN: 25 mg/dL — ABNORMAL HIGH (ref 8–23)
CO2: 23 mmol/L (ref 22–32)
Calcium: 8.4 mg/dL — ABNORMAL LOW (ref 8.9–10.3)
Chloride: 93 mmol/L — ABNORMAL LOW (ref 98–111)
Creatinine, Ser: 1.84 mg/dL — ABNORMAL HIGH (ref 0.61–1.24)
GFR calc Af Amer: 41 mL/min — ABNORMAL LOW (ref >60)
GFR calc non Af Amer: 36 mL/min — ABNORMAL LOW (ref >60)
Glucose, Bld: 126 mg/dL — ABNORMAL HIGH (ref 70–99)
Potassium: 3.4 mmol/L — ABNORMAL LOW (ref 3.5–5.1)
Sodium: 133 mmol/L — ABNORMAL LOW (ref 135–145)
Total Bilirubin: 0.4 mg/dL (ref 0.3–1.2)
Total Protein: 6.6 g/dL (ref 6.5–8.1)

## 2018-09-21 LAB — BLOOD GAS, VENOUS
Acid-Base Excess: 0.8 mmol/L (ref 0.0–2.0)
Bicarbonate: 21.6 mmol/L (ref 20.0–28.0)
FIO2: 44
O2 Saturation: 7.1 %
Patient temperature: 36.8
pCO2, Ven: 53 mmHg (ref 44.0–60.0)
pH, Ven: 7.316 (ref 7.250–7.430)
pO2, Ven: <31 mmHg — CL (ref 32.0–45.0)

## 2018-09-21 LAB — LACTIC ACID, PLASMA: Lactic Acid, Venous: 3.3 mmol/L (ref 0.5–1.9)

## 2018-09-21 LAB — BRAIN NATRIURETIC PEPTIDE: B Natriuretic Peptide: 97 pg/mL (ref 0.0–100.0)

## 2018-09-21 LAB — SARS CORONAVIRUS 2 BY RT PCR (HOSPITAL ORDER, PERFORMED IN ~~LOC~~ HOSPITAL LAB): SARS Coronavirus 2: NEGATIVE

## 2018-09-21 LAB — TROPONIN I: Troponin I: <0.03 ng/mL (ref <0.03)

## 2018-09-21 MED ORDER — ONDANSETRON HCL 4 MG PO TABS: 4.0000 mg | ORAL_TABLET | Freq: Four times a day (QID) | ORAL | Status: DC | PRN

## 2018-09-21 MED ORDER — POTASSIUM CHLORIDE CRYS ER 20 MEQ PO TBCR
20.0000 meq | EXTENDED_RELEASE_TABLET | Freq: Once | ORAL | Status: AC
  Administered 2018-09-21: 20:00:00 20 meq via ORAL
  Filled 2018-09-21: qty 1

## 2018-09-21 MED ORDER — ACETAMINOPHEN 325 MG PO TABS: 650.0000 mg | ORAL_TABLET | Freq: Four times a day (QID) | ORAL | Status: DC | PRN

## 2018-09-21 MED ORDER — AMLODIPINE BESYLATE 5 MG PO TABS
5.0000 mg | ORAL_TABLET | Freq: Every morning | ORAL | Status: DC
  Administered 2018-09-22: 09:00:00 5 mg via ORAL
  Filled 2018-09-21 (×2): qty 1

## 2018-09-21 MED ORDER — METHYLPREDNISOLONE SODIUM SUCC 40 MG IJ SOLR
40.0000 mg | Freq: Four times a day (QID) | INTRAMUSCULAR | Status: DC
  Administered 2018-09-21 – 2018-09-22 (×2): 40 mg via INTRAVENOUS
  Filled 2018-09-21 (×2): qty 1

## 2018-09-21 MED ORDER — ONDANSETRON HCL 4 MG/2ML IJ SOLN: 4.0000 mg | Freq: Four times a day (QID) | INTRAMUSCULAR | Status: DC | PRN

## 2018-09-21 MED ORDER — ACETAMINOPHEN 650 MG RE SUPP: 650.0000 mg | Freq: Four times a day (QID) | RECTAL | Status: DC | PRN

## 2018-09-21 MED ORDER — HYDROCODONE-ACETAMINOPHEN 5-325 MG PO TABS
1.0000 | ORAL_TABLET | Freq: Four times a day (QID) | ORAL | Status: DC | PRN
  Administered 2018-09-21: 22:00:00 1 via ORAL
  Filled 2018-09-21: qty 1

## 2018-09-21 MED ORDER — ESCITALOPRAM OXALATE 10 MG PO TABS
10.0000 mg | ORAL_TABLET | Freq: Every morning | ORAL | Status: DC
  Administered 2018-09-22 – 2018-09-23 (×2): 10 mg via ORAL
  Filled 2018-09-21 (×2): qty 1

## 2018-09-21 MED ORDER — ATORVASTATIN CALCIUM 10 MG PO TABS
10.0000 mg | ORAL_TABLET | Freq: Every day | ORAL | Status: DC
  Administered 2018-09-21 – 2018-09-22 (×2): 10 mg via ORAL
  Filled 2018-09-21 (×2): qty 1

## 2018-09-21 MED ORDER — FOLIC ACID 1 MG PO TABS
1.0000 mg | ORAL_TABLET | Freq: Every morning | ORAL | Status: DC
  Administered 2018-09-22 – 2018-09-23 (×2): 1 mg via ORAL
  Filled 2018-09-21 (×2): qty 1

## 2018-09-21 MED ORDER — IPRATROPIUM-ALBUTEROL 0.5-2.5 (3) MG/3ML IN SOLN
3.0000 mL | Freq: Four times a day (QID) | RESPIRATORY_TRACT | Status: DC
  Administered 2018-09-21 – 2018-09-23 (×7): 3 mL via RESPIRATORY_TRACT
  Filled 2018-09-21 (×7): qty 3

## 2018-09-21 MED ORDER — ENOXAPARIN SODIUM 40 MG/0.4ML ~~LOC~~ SOLN
40.0000 mg | SUBCUTANEOUS | Status: DC
  Administered 2018-09-21: 22:00:00 40 mg via SUBCUTANEOUS
  Filled 2018-09-21 (×2): qty 0.4

## 2018-09-21 MED ORDER — TIMOLOL HEMIHYDRATE 0.5 % OP SOLN
1.0000 [drp] | Freq: Two times a day (BID) | OPHTHALMIC | Status: DC
  Administered 2018-09-22 – 2018-09-23 (×2): 1 [drp] via OPHTHALMIC
  Filled 2018-09-21: qty 5
  Filled 2018-09-21: qty 10

## 2018-09-21 MED ORDER — FENTANYL CITRATE (PF) 100 MCG/2ML IJ SOLN
25.0000 ug | Freq: Once | INTRAMUSCULAR | Status: AC
  Administered 2018-09-21: 20:00:00 25 ug via INTRAVENOUS
  Filled 2018-09-21: qty 2

## 2018-09-21 MED ORDER — SODIUM CHLORIDE 0.9 % IV SOLN: INTRAVENOUS | Status: DC

## 2018-09-21 MED ORDER — LATANOPROST 0.005 % OP SOLN
1.0000 [drp] | Freq: Every day | OPHTHALMIC | Status: DC
  Administered 2018-09-22: 21:00:00 1 [drp] via OPHTHALMIC
  Filled 2018-09-21: qty 2.5

## 2018-09-21 NOTE — ED Notes (Signed)
CRITICAL VALUE ALERT  Critical Value:  Po2 < 31.0  Date & Time Notied:  09/21/2018, 1748  Provider Notified: Dr. Sedonia Small  Orders Received/Actions taken: see chart
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
## 2018-09-21 NOTE — ED Notes (Signed)
Date and time results received: 05/29/201909 (use smartphrase ".now" to insert current time)  Test: lactic acid Critical Value: 3.3  Name of Provider Notified: bero,md  Orders Received? Or Actions Taken?:
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
## 2018-09-21 NOTE — Progress Notes (Signed)
Sputum cup placed in room for sputum

## 2018-09-22 ENCOUNTER — Encounter (HOSPITAL_COMMUNITY): Payer: Self-pay

## 2018-09-22 ENCOUNTER — Other Ambulatory Visit: Payer: Self-pay

## 2018-09-22 DIAGNOSIS — J9601 Acute respiratory failure with hypoxia: Secondary | ICD-10-CM

## 2018-09-22 LAB — CBC
HCT: 29.4 % — ABNORMAL LOW (ref 39.0–52.0)
Hemoglobin: 9.4 g/dL — ABNORMAL LOW (ref 13.0–17.0)
MCH: 28 pg (ref 26.0–34.0)
MCHC: 32 g/dL (ref 30.0–36.0)
MCV: 87.5 fL (ref 80.0–100.0)
Platelets: 276 10*3/uL (ref 150–400)
RBC: 3.36 MIL/uL — ABNORMAL LOW (ref 4.22–5.81)
RDW: 18.9 % — ABNORMAL HIGH (ref 11.5–15.5)
WBC: 6.3 10*3/uL (ref 4.0–10.5)
nRBC: 0 % (ref 0.0–0.2)

## 2018-09-22 LAB — COMPREHENSIVE METABOLIC PANEL
ALT: 24 U/L (ref 0–44)
AST: 18 U/L (ref 15–41)
Albumin: 2.9 g/dL — ABNORMAL LOW (ref 3.5–5.0)
Alkaline Phosphatase: 51 U/L (ref 38–126)
Anion gap: 13 (ref 5–15)
BUN: 21 mg/dL (ref 8–23)
CO2: 24 mmol/L (ref 22–32)
Calcium: 8.4 mg/dL — ABNORMAL LOW (ref 8.9–10.3)
Chloride: 96 mmol/L — ABNORMAL LOW (ref 98–111)
Creatinine, Ser: 1.23 mg/dL (ref 0.61–1.24)
GFR calc Af Amer: >60 mL/min (ref >60)
GFR calc non Af Amer: 58 mL/min — ABNORMAL LOW (ref >60)
Glucose, Bld: 167 mg/dL — ABNORMAL HIGH (ref 70–99)
Potassium: 3.9 mmol/L (ref 3.5–5.1)
Sodium: 133 mmol/L — ABNORMAL LOW (ref 135–145)
Total Bilirubin: 0.6 mg/dL (ref 0.3–1.2)
Total Protein: 6.6 g/dL (ref 6.5–8.1)

## 2018-09-22 LAB — RESPIRATORY PANEL BY PCR

## 2018-09-22 MED ORDER — METHYLPREDNISOLONE SODIUM SUCC 40 MG IJ SOLR
40.0000 mg | Freq: Two times a day (BID) | INTRAMUSCULAR | Status: DC
  Administered 2018-09-22 – 2018-09-23 (×2): 40 mg via INTRAVENOUS
  Filled 2018-09-22 (×2): qty 1

## 2018-09-22 MED ORDER — GUAIFENESIN ER 600 MG PO TB12
600.0000 mg | ORAL_TABLET | Freq: Two times a day (BID) | ORAL | Status: DC
  Administered 2018-09-22 – 2018-09-23 (×3): 600 mg via ORAL
  Filled 2018-09-22 (×3): qty 1

## 2018-09-22 MED ORDER — ALBUTEROL SULFATE (2.5 MG/3ML) 0.083% IN NEBU: 2.5000 mg | INHALATION_SOLUTION | RESPIRATORY_TRACT | Status: DC | PRN

## 2018-09-22 MED ORDER — DOXYCYCLINE HYCLATE 100 MG PO TABS
100.0000 mg | ORAL_TABLET | Freq: Two times a day (BID) | ORAL | Status: DC
  Administered 2018-09-22 – 2018-09-23 (×2): 100 mg via ORAL
  Filled 2018-09-22 (×2): qty 1

## 2018-09-22 MED ORDER — SENNOSIDES-DOCUSATE SODIUM 8.6-50 MG PO TABS
2.0000 | ORAL_TABLET | Freq: Two times a day (BID) | ORAL | Status: DC
  Administered 2018-09-22 (×2): 2 via ORAL
  Filled 2018-09-22 (×3): qty 2

## 2018-09-22 NOTE — Progress Notes (Signed)
Patient Demographics:    Joel Valdez, is a 73 y.o. male, DOB - 1945-09-06, LHT:[id number]  Admit date - 09/21/2018   Admitting Physician Oswald Hillock, MD  Outpatient Primary MD for the patient is [name], PA-C  LOS - 0   No chief complaint on file.       Subjective:    Joel Valdez today has no fevers, no emesis,  No chest pain, cough and shortness of breath persist  Assessment  & Plan :    Active Problems:   Acute respiratory failure with hypoxia [facility])  Brief summary 72 y.o. male,With history of pulmonary fibrosis, chronic hypoxic respiratory failure on home O2, prostate cancer, rheumatoid arthritis, history of hypertension, GERD, recurrent hospitalization for respiratory failure with last hospitalization on May 21 discharged on May 24--- readmitted 09/21/2018 with worsening respiratory status and increased oxygen requirement  A/p 1) acute on chronic hypoxic respiratory failure--- patient with underlying pulmonary fibrosis ,at baseline patient uses 4 L of oxygen on admission was requiring at least 6 L of oxygen, continue IV Solu-Medrol, treat empirically with doxycycline and Mucinex  2)HTN--stable, continue amlodipine 5 mg daily  3)AKI--suspect due to dehydration on admission creatinine was up to 1.8 from a baseline around 1.2 usually, Lasix and lisinopril was discontinued on admission, creatinine improving now... Continue to avoid nephrotoxic agents  4)HFpEF--- Echo from 08/13/2018 with EF over 57% and diastolic dysfunction... Lasix on hold due to AKI, patient does not appear volume overloaded at this time  Disposition/Need for in-Hospital Stay- patient unable to be discharged at this time due to worsening respiratory status including worsening hypoxia  Code Status : Full  Family Communication:   na   Disposition Plan  : home  Consults  :  na  DVT Prophylaxis  :  Lovenox -   Lab  Results  Component Value Date   PLT 276 09/22/2018    Inpatient Medications  Scheduled Meds:  amLODipine  5 mg Oral q morning - 10a   atorvastatin  10 mg Oral QHS   enoxaparin (LOVENOX) injection  40 mg Subcutaneous Q24H   escitalopram  10 mg Oral q morning - 26O   folic acid  1 mg Oral q morning - 10a   guaiFENesin  600 mg Oral BID   ipratropium-albuterol  3 mL Nebulization Q6H   latanoprost  1 drop Both Eyes QHS   methylPREDNISolone (SOLU-MEDROL) injection  40 mg Intravenous Q12H   senna-docusate  2 tablet Oral BID   timolol  1 drop Both Eyes BID   Continuous Infusions:  sodium chloride     PRN Meds:.acetaminophen **OR** acetaminophen, albuterol, HYDROcodone-acetaminophen, ondansetron **OR** ondansetron (ZOFRAN) IV    Anti-infectives (From admission, onward)   None        Objective:   Vitals:   09/22/18 0520 09/22/18 0839 09/22/18 1356 09/22/18 1416  BP: 99/61  94/62   Pulse: 86  96   Resp: 18  19   Temp: 98.3 F (36.8 C)  98.8 F (37.1 C)   TempSrc: Oral  Oral   SpO2: 100% 93% 97% 91%  Weight:      Height:        Wt Readings from Last 3 Encounters:  09/21/18 65.6 kg  09/19/18  65.5 kg  09/16/18 69.2 kg     Intake/Output Summary (Last 24 hours) at 09/22/2018 1804 Last data filed at 09/22/2018 1300 Gross per 24 hour  Intake 960 ml  Output 1550 ml  Net -590 ml     Physical Exam Patient is examined daily including today on 09/22/18 , exams remain the same as of yesterday except that has changed   Gen:- Awake Alert, able to speak in short sentences HEENT:- [place].AT, No sclera icterus Nose- [place] 6L/min Neck-Supple Neck,No JVD,.  Lungs-Velcro type rales, overall diminished air movement  CV- S1, S2 normal, regular  Abd-  +ve B.Sounds, Abd Soft, No tenderness,    Extremity/Skin:- No  edema, pedal pulses present  Psych-affect is appropriate, oriented x3 Neuro-no new focal deficits, no tremors   Data Review:   Micro Results Recent Results  (from the past 240 hour(s))  SARS Coronavirus 2 (CEPHEID- Performed in [place] hospital lab), Hosp Order     Status: None   Collection Time: 09/13/18  5:40 PM  Result Value Ref Range Status   SARS Coronavirus 2 NEGATIVE NEGATIVE Final    Comment: (NOTE) If result is NEGATIVE SARS-CoV-2 target nucleic acids are NOT DETECTED. The SARS-CoV-2 RNA is generally detectable in upper and lower  respiratory specimens during the acute phase of infection. The lowest  concentration of SARS-CoV-2 viral copies this assay can detect is 250  copies / mL. A negative result does not preclude SARS-CoV-2 infection  and should not be used as the sole basis for treatment or other  patient management decisions.  A negative result may occur with  improper specimen collection / handling, submission of specimen other  than nasopharyngeal swab, presence of viral mutation(s) within the  areas targeted by this assay, and inadequate number of viral copies  (<250 copies / mL). A negative result must be combined with clinical  observations, patient history, and epidemiological information. If result is POSITIVE SARS-CoV-2 target nucleic acids are DETECTED. The SARS-CoV-2 RNA is generally detectable in upper and lower  respiratory specimens dur ing the acute phase of infection.  Positive  results are indicative of active infection with SARS-CoV-2.  Clinical  correlation with patient history and other diagnostic information is  necessary to determine patient infection status.  Positive results do  not rule out bacterial infection or co-infection with other viruses. If result is PRESUMPTIVE POSTIVE SARS-CoV-2 nucleic acids MAY BE PRESENT.   A presumptive positive result was obtained on the submitted specimen  and confirmed on repeat testing.  While 2019 novel coronavirus  (SARS-CoV-2) nucleic acids may be present in the submitted sample  additional confirmatory testing may be necessary for epidemiological  and /  or clinical management purposes  to differentiate between  SARS-CoV-2 and other Sarbecovirus currently known to infect humans.  If clinically indicated additional testing with an alternate test  methodology [phone]) is advised. The SARS-CoV-2 RNA is generally  detectable in upper and lower respiratory sp ecimens during the acute  phase of infection. The expected result is Negative. Fact Sheet for Patients:  StrictlyIdeas.no Fact Sheet for Healthcare Providers: BankingDealers.co.za This test is not yet approved or cleared by the [place] FDA and has been authorized for detection and/or diagnosis of SARS-CoV-2 by FDA under an Emergency Use Authorization (EUA).  This EUA will remain in effect (meaning this test can be used) for the duration of the COVID-19 declaration under Section 564(b)(1) of the Act, 21 U.S.C. section 360bbb-3(b)(1), unless the authorization is terminated or revoked  sooner. Performed at [facility], [address], [place], [place] [address]   SARS Coronavirus 2 (CEPHEID- Performed in [facility] hospital lab), Hosp Order     Status: None   Collection Time: 09/21/18  4:57 PM  Result Value Ref Range Status   SARS Coronavirus 2 NEGATIVE NEGATIVE Final    Comment: (NOTE) If result is NEGATIVE SARS-CoV-2 target nucleic acids are NOT DETECTED. The SARS-CoV-2 RNA is generally detectable in upper and lower  respiratory specimens during the acute phase of infection. The lowest  concentration of SARS-CoV-2 viral copies this assay can detect is 250  copies / mL. A negative result does not preclude SARS-CoV-2 infection  and should not be used as the sole basis for treatment or other  patient management decisions.  A negative result may occur with  improper specimen collection / handling, submission of specimen other  than nasopharyngeal swab, presence of viral mutation(s) within the  areas targeted by this assay, and  inadequate number of viral copies  (<250 copies / mL). A negative result must be combined with clinical  observations, patient history, and epidemiological information. If result is POSITIVE SARS-CoV-2 target nucleic acids are DETECTED. The SARS-CoV-2 RNA is generally detectable in upper and lower  respiratory specimens dur ing the acute phase of infection.  Positive  results are indicative of active infection with SARS-CoV-2.  Clinical  correlation with patient history and other diagnostic information is  necessary to determine patient infection status.  Positive results do  not rule out bacterial infection or co-infection with other viruses. If result is PRESUMPTIVE POSTIVE SARS-CoV-2 nucleic acids MAY BE PRESENT.   A presumptive positive result was obtained on the submitted specimen  and confirmed on repeat testing.  While 2019 novel coronavirus  (SARS-CoV-2) nucleic acids may be present in the submitted sample  additional confirmatory testing may be necessary for epidemiological  and / or clinical management purposes  to differentiate between  SARS-CoV-2 and other Sarbecovirus currently known to infect humans.  If clinically indicated additional testing with an alternate test  methodology [phone]) is advised. The SARS-CoV-2 RNA is generally  detectable in upper and lower respiratory sp ecimens during the acute  phase of infection. The expected result is Negative. Fact Sheet for Patients:  StrictlyIdeas.no Fact Sheet for Healthcare Providers: BankingDealers.co.za This test is not yet approved or cleared by the [place] FDA and has been authorized for detection and/or diagnosis of SARS-CoV-2 by FDA under an Emergency Use Authorization (EUA).  This EUA will remain in effect (meaning this test can be used) for the duration of the COVID-19 declaration under Section 564(b)(1) of the Act, 21 U.S.C. section 360bbb-3(b)(1), unless the  authorization is terminated or revoked sooner. Performed at [facility], [address] [place], [place] [address]   Culture, blood (Routine X 2) w Reflex to ID Panel     Status: None (Preliminary result)   Collection Time: 09/21/18  5:10 PM  Result Value Ref Range Status   Specimen Description BLOOD RIGHT ARM  Final   Special Requests   Final    BOTTLES DRAWN AEROBIC AND ANAEROBIC Blood Culture adequate volume   Culture   Final    NO GROWTH < 12 HOURS Performed at [facility], [address], [place], [place] [address]    Report Status PENDING  Incomplete  Culture, blood (Routine X 2) w Reflex to ID Panel     Status: None (Preliminary result)   Collection Time: 09/21/18  6:29 PM  Result Value Ref Range Status   Specimen Description BLOOD RIGHT HAND  Final   Special Requests   Final    BOTTLES DRAWN AEROBIC ONLY Blood Culture adequate volume   Culture   Final    NO GROWTH < 12 HOURS Performed at [facility], [address], [place], [place] [address]    Report Status PENDING  Incomplete  Respiratory Panel by PCR     Status: None   Collection Time: 09/21/18  9:45 PM  Result Value Ref Range Status   Adenovirus NOT DETECTED NOT DETECTED Final   Coronavirus 229E NOT DETECTED NOT DETECTED Final    Comment: (NOTE) The Coronavirus on the Respiratory Panel, DOES NOT test for the novel  Coronavirus (2019 nCoV)    Coronavirus HKU1 NOT DETECTED NOT DETECTED Final   Coronavirus NL63 NOT DETECTED NOT DETECTED Final   Coronavirus OC43 NOT DETECTED NOT DETECTED Final   Metapneumovirus NOT DETECTED NOT DETECTED Final   Rhinovirus / Enterovirus NOT DETECTED NOT DETECTED Final   Influenza A NOT DETECTED NOT DETECTED Final   Influenza B NOT DETECTED NOT DETECTED Final   Parainfluenza Virus 1 NOT DETECTED NOT DETECTED Final   Parainfluenza Virus 2 NOT DETECTED NOT DETECTED Final   Parainfluenza Virus 3 NOT DETECTED NOT DETECTED Final   Parainfluenza Virus 4 NOT DETECTED NOT DETECTED Final     Respiratory Syncytial Virus NOT DETECTED NOT DETECTED Final   Bordetella pertussis NOT DETECTED NOT DETECTED Final   Chlamydophila pneumoniae NOT DETECTED NOT DETECTED Final   Mycoplasma pneumoniae NOT DETECTED NOT DETECTED Final    Comment: Performed at [facility], [place], [place], [place] [address]    Radiology Reports Dg Chest 2 View  Result Date: 08/29/2018 CLINICAL DATA:  Dyspnea on exertion. EXAM: CHEST - 2 VIEW COMPARISON:  08/21/2018 and CT 08/22/2018 FINDINGS: Lungs are hypoinflated demonstrate bilateral coarse interstitial disease compatible with known fibrosis. No new airspace process or effusion. Cardiomediastinal silhouette and remainder of the exam is unchanged. IMPRESSION: No acute findings. Stable bilateral interstitial disease. Electronically Signed   By: Marin Olp M.D.   On: 08/29/2018 15:30   Ct Angio Chest Pe W And/or Wo Contrast  Result Date: 08/30/2018 CLINICAL DATA:  73 year old male with shortness of breath EXAM: CT ANGIOGRAPHY CHEST WITH CONTRAST TECHNIQUE: Multidetector CT imaging of the chest was performed using the standard protocol during bolus administration of intravenous contrast. Multiplanar CT image reconstructions and MIPs were obtained to evaluate the vascular anatomy. CONTRAST:  124mL OMNIPAQUE IOHEXOL 350 MG/ML SOLN COMPARISON:  CT 08/22/2018, 08/09/2018 FINDINGS: Cardiovascular: Heart: Similar appearance of the heart size with cardiomegaly. No pericardial fluid/thickening . Calcifications of left main, left anterior descending, right coronary arteries. Aorta: Unremarkable course, caliber, contour of the thoracic aorta. No aneurysm or dissection flap. No periaortic fluid. Calcifications of the aortic arch. Pulmonary arteries: No central, lobar, segmental, or proximal subsegmental filling defects. Main pulmonary diameter measures 3.0 cm Mediastinum/Nodes: No mediastinal adenopathy. Unremarkable appearance of the thoracic esophagus. Unremarkable  thoracic inlet Lungs/Pleura: Redemonstration of background of centrilobular and paraseptal emphysema with architectural distortion and a pattern of subpleural reticulation/honeycomb pattern of the bilateral lungs, predominance of the lower lobes. Interlobular septal thickening. New trace left pleural fluid compared to the prior CT. Slight increased right pleural fluid at the fissure compared to the prior CT. No endotracheal or endobronchial debris. Bronchial wall thickening is similar to prior. Overall, there is slight increased interstitial opacities. Upper Abdomen: No acute. Musculoskeletal: No acute  displaced fracture. Degenerative changes of the spine. Review of the MIP images confirms the above findings. IMPRESSION: Redemonstration of advanced interstitial fibrosis, with likely superimposed acute bronchitis/atypical infection given that there is slight increased prominence of the interstitial opacities, enlarging right pleural effusion, and new left pleural effusion. No evidence of overt edema. Aortic atherosclerosis and coronary artery disease. Aortic Atherosclerosis (ICD10-I70.0). Electronically Signed   By: Corrie Mckusick D.O.   On: 08/30/2018 15:31   US Venous Img Lower Unilateral Right  Result Date: 08/31/2018 CLINICAL DATA:  Right lower extremity edema. History of prostate cancer. Evaluate for DVT. EXAM: RIGHT LOWER EXTREMITY VENOUS DOPPLER ULTRASOUND TECHNIQUE: Gray-scale sonography with graded compression, as well as color Doppler and duplex ultrasound were performed to evaluate the lower extremity deep venous systems from the level of the common femoral vein and including the common femoral, femoral, profunda femoral, popliteal and calf veins including the posterior tibial, peroneal and gastrocnemius veins when visible. The superficial great saphenous vein was also interrogated. Spectral Doppler was utilized to evaluate flow at rest and with distal augmentation maneuvers in the common femoral,  femoral and popliteal veins. COMPARISON:  None. FINDINGS: Contralateral Common Femoral Vein: Respiratory phasicity is normal and symmetric with the symptomatic side. No evidence of thrombus. Normal compressibility. Common Femoral Vein: No evidence of thrombus. Normal compressibility, respiratory phasicity and response to augmentation. Saphenofemoral Junction: No evidence of thrombus. Normal compressibility and flow on color Doppler imaging. Profunda Femoral Vein: No evidence of thrombus. Normal compressibility and flow on color Doppler imaging. Femoral Vein: No evidence of thrombus. Normal compressibility, respiratory phasicity and response to augmentation. Popliteal Vein: No evidence of thrombus. Normal compressibility, respiratory phasicity and response to augmentation. Calf Veins: No evidence of thrombus. Normal compressibility and flow on color Doppler imaging. Superficial Great Saphenous Vein: No evidence of thrombus. Normal compressibility. Venous Reflux:  None. Other Findings: A minimal subcutaneous edema is noted at the level right lower leg and calf. IMPRESSION: No evidence DVT within the right lower extremity. Electronically Signed   By: Sandi Mariscal M.D.   On: 08/31/2018 09:19   Dg Chest Port 1 View  Result Date: 09/21/2018 CLINICAL DATA:  Shortness of breath today. History of pulmonary fibrosis. EXAM: PORTABLE CHEST 1 VIEW COMPARISON:  CT chest 08/30/2018. Single-view of the chest 08/04/2018 and 09/13/2018. PA and lateral chest 08/21/2018. FINDINGS: Lung volumes are low. Extensive bilateral pulmonary fibrosis appears unchanged. No new airspace disease, pneumothorax or effusion. Heart size is mildly enlarged. IMPRESSION: No acute abnormality. Extensive chronic interstitial lung disease. Electronically Signed   By: Inge Rise M.D.   On: 09/21/2018 18:35   Dg Chest Port 1 View  Result Date: 09/13/2018 CLINICAL DATA:  73 year old male with a history of shortness of breath EXAM: PORTABLE CHEST 1  VIEW COMPARISON:  08/30/2018, CT 08/30/2018 FINDINGS: Cardiomediastinal silhouette unchanged in size and contour. No pneumothorax. Low lung volumes with reticulonodular opacities throughout the lungs with superimposed architectural distortion, similar to the comparison chest x-ray. Overall aeration is similar to the comparison. No large pleural effusion.  No displaced fracture IMPRESSION: Similar appearance of the chest x-ray, with reticulonodular opacities likely representing multifocal infection superimposed on interstitial fibrosis. Electronically Signed   By: Corrie Mckusick D.O.   On: 09/13/2018 16:26   Dg Chest Portable 1 View  Result Date: 08/30/2018 CLINICAL DATA:  Shortness of breath. History of smoking and pulmonary fibrosis. EXAM: PORTABLE CHEST 1 VIEW COMPARISON:  08/29/2018 FINDINGS: The cardiomediastinal silhouette is unchanged. Aortic atherosclerosis is noted. Lung volumes  remain mildly low with chronic coarse interstitial opacities again noted throughout both lungs. No definite acute airspace opacity is identified allowing for portable AP technique today compared to two view upright imaging yesterday. No sizable pleural effusion or pneumothorax is identified. No acute osseous abnormality is seen. IMPRESSION: Chronic interstitial lung disease without evidence of acute abnormality. Electronically Signed   By: Logan Bores M.D.   On: 08/30/2018 12:24     CBC Recent Labs  Lab 09/16/18 0423 09/21/18 1710 09/22/18 0618  WBC 16.0* 12.2* 6.3  HGB 9.1* 10.3* 9.4*  HCT 29.0* 31.9* 29.4*  PLT 296 286 276  MCV 91.2 87.6 87.5  MCH 28.6 28.3 28.0  MCHC 31.4 32.3 32.0  RDW 20.0* 19.2* 18.9*  LYMPHSABS 1.4  --   --   MONOABS 0.9  --   --   EOSABS 0.1  --   --   BASOSABS 0.0  --   --     Chemistries  Recent Labs  Lab 09/16/18 0423 09/21/18 1710 09/22/18 0618  NA 141 133* 133*  K 3.6 3.4* 3.9  CL 103 93* 96*  CO2 28 23 24   GLUCOSE 77 126* 167*  BUN 26* 25* 21  CREATININE 1.23 1.84*  1.23  CALCIUM 7.9* 8.4* 8.4*  AST  --  20 18  ALT  --  26 24  ALKPHOS  --  52 51  BILITOT  --  0.4 0.6   ------------------------------------------------------------------------------------------------------------------ No results for input(s): CHOL, HDL, LDLCALC, TRIG, CHOLHDL, LDLDIRECT in the last 72 hours.  Lab Results  Component Value Date   HGBA1C 5.8 04/10/2017   ------------------------------------------------------------------------------------------------------------------ No results for input(s): TSH, T4TOTAL, T3FREE, THYROIDAB in the last 72 hours.  Invalid input(s): FREET3 ------------------------------------------------------------------------------------------------------------------ No results for input(s): VITAMINB12, FOLATE, FERRITIN, TIBC, IRON, RETICCTPCT in the last 72 hours.  Coagulation profile No results for input(s): INR, PROTIME in the last 168 hours.  No results for input(s): DDIMER in the last 72 hours.  Cardiac Enzymes Recent Labs  Lab 09/21/18 1710  TROPONINI <0.03   ------------------------------------------------------------------------------------------------------------------    Component Value Date/Time   BNP 97.0 09/21/2018 1710     Aury Scollard M.D on 09/22/2018 at 6:04 PM  Go to [email or web address] - for contact info  [facility] - Office  [phone]

## 2018-09-23 DIAGNOSIS — J9601 Acute respiratory failure with hypoxia: Secondary | ICD-10-CM | POA: Diagnosis not present

## 2018-09-23 LAB — BASIC METABOLIC PANEL
Anion gap: 14 (ref 5–15)
BUN: 27 mg/dL — ABNORMAL HIGH (ref 8–23)
CO2: 22 mmol/L (ref 22–32)
Calcium: 8.7 mg/dL — ABNORMAL LOW (ref 8.9–10.3)
Chloride: 99 mmol/L (ref 98–111)
Creatinine, Ser: 1.1 mg/dL (ref 0.61–1.24)
GFR calc Af Amer: >60 mL/min (ref >60)
GFR calc non Af Amer: >60 mL/min (ref >60)
Glucose, Bld: 149 mg/dL — ABNORMAL HIGH (ref 70–99)
Potassium: 3.9 mmol/L (ref 3.5–5.1)
Sodium: 135 mmol/L (ref 135–145)

## 2018-09-23 MED ORDER — FUROSEMIDE 20 MG PO TABS: 20.0000 mg | ORAL_TABLET | Freq: Every morning | ORAL | 1 refills | Status: AC

## 2018-09-23 MED ORDER — GUAIFENESIN ER 600 MG PO TB12: 600.0000 mg | ORAL_TABLET | Freq: Two times a day (BID) | ORAL | 0 refills | Status: DC

## 2018-09-23 MED ORDER — LOPERAMIDE HCL 2 MG PO CAPS
2.0000 mg | ORAL_CAPSULE | Freq: Once | ORAL | Status: AC
  Administered 2018-09-23: 12:00:00 2 mg via ORAL
  Filled 2018-09-23: qty 1

## 2018-09-23 MED ORDER — PREDNISONE 20 MG PO TABS: 40.0000 mg | ORAL_TABLET | Freq: Every day | ORAL | 0 refills | Status: DC

## 2018-09-23 MED ORDER — DOXYCYCLINE HYCLATE 100 MG PO TABS: 100.0000 mg | ORAL_TABLET | Freq: Two times a day (BID) | ORAL | 0 refills | Status: AC

## 2018-09-23 NOTE — Discharge Summary (Addendum)
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 is appropriate, oriented x3 Neuro-no new focal deficits, no tremors   Data Review   CBC w Diff:  Lab Results  Component Value Date   WBC 6.3 09/22/2018   HGB 9.4 (L) 09/22/2018   HCT 29.4 (L) 09/22/2018   PLT 276 09/22/2018   LYMPHOPCT 8 09/16/2018   MONOPCT 6 09/16/2018   EOSPCT 1 09/16/2018   BASOPCT 0 09/16/2018    CMP:  Lab Results  Component Value Date   NA 135 09/23/2018   K  3.9 09/23/2018   CL 99 09/23/2018   CO2 22 09/23/2018   BUN 27 (H) 09/23/2018   CREATININE 1.10 09/23/2018   CREATININE 1.55 (H) 01/29/2018   PROT 6.6 09/22/2018   ALBUMIN 2.9 (L) 09/22/2018   BILITOT 0.6 09/22/2018   ALKPHOS 51 09/22/2018   AST 18 09/22/2018   ALT 24 09/22/2018  .   Total Discharge time is about 33 minutes  Roxan Hockey M.D on 09/23/2018 at 12:23 PM  Go to [email or web address] -  for contact info  [facility] - Office  [phone]

## 2018-09-23 NOTE — Discharge Instructions (Signed)
1) your blood pressure was running low so stop amlodipine/Norvasc, stop lisinopril and decrease Lasix to 20 mg daily 2) continue breathing treatments, oxygen and the rest of his medications as prescribed 3) follow-up to primary care doctor within a week for recheck and reevaluation

## 2018-09-23 NOTE — Progress Notes (Signed)
IV removed, patient transported to private vehicle via wheelchair with oxygen. Instructions reviewed with patient and wife, verbalized understanding.

## 2018-09-24 ENCOUNTER — Telehealth: Payer: Self-pay

## 2018-09-24 NOTE — Telephone Encounter (Signed)
Copied from [place]. Topic: General - Inquiry >> Sep 21, 2018  4:20 PM Richardo Priest, [place] wrote: Reason for CRM: Ms.Brady, RN with [facility], wanted to notify Elyn Aquas that the patient was having an issue today breathing again and was sent back to the ED. States problem went on for 3 hours. Ms.Brady is requesting resumption of care orders be placed again when they are ready to resume care. Call back is [phone] and ask for Melissa.

## 2018-09-24 NOTE — Telephone Encounter (Signed)
Patient discharged back home. Ok to restart care

## 2018-09-24 NOTE — Telephone Encounter (Signed)
Spoke with patients spouse, [name].   Transition Care Management Follow-up Telephone Call  Admission date:  09/21/2018   Discharge Date:  09/23/2018  Principal Problem:  Acute on chronic respiratory failure with hypoxemia    How have you been since you were released from the hospital? "He's pretty good today"   Do you understand why you were in the hospital? yes   Do you understand the discharge instructions? yes   Where were you discharged to? Home. Resides with family.    Items Reviewed:  Medications reviewed: yes  Allergies reviewed: yes  Dietary changes reviewed: yes  Referrals reviewed: yes   Functional Questionnaire:   Activities of Daily Living (ADLs):   He states they are independent in the following: ambulation, bathing and hygiene, feeding, continence, grooming, toileting and dressing States they require assistance with the following: None.    Any transportation issues/concerns?: no   Any patient concerns? no   Confirmed importance and date/time of follow-up visits scheduled yes  Provider Appointment booked with PCP, IN PERSON VISIT, Friday, 09/28/2018.   Confirmed with patient if condition begins to worsen call PCP or go to the ER.  Patient was given the office number and encouraged to call back with question or concerns.  : yes

## 2018-09-24 NOTE — Telephone Encounter (Signed)
Called [facility] and spoke with Melissa given verbal orders to restart care for PT and Nursing.

## 2018-09-25 DIAGNOSIS — Z87891 Personal history of nicotine dependence: Secondary | ICD-10-CM | POA: Diagnosis not present

## 2018-09-25 DIAGNOSIS — J61 Pneumoconiosis due to asbestos and other mineral fibers: Secondary | ICD-10-CM | POA: Diagnosis not present

## 2018-09-25 DIAGNOSIS — J9621 Acute and chronic respiratory failure with hypoxia: Secondary | ICD-10-CM | POA: Diagnosis not present

## 2018-09-25 DIAGNOSIS — E785 Hyperlipidemia, unspecified: Secondary | ICD-10-CM | POA: Diagnosis not present

## 2018-09-25 DIAGNOSIS — I700 Atherosclerosis of aorta: Secondary | ICD-10-CM | POA: Diagnosis not present

## 2018-09-25 DIAGNOSIS — J8489 Other specified interstitial pulmonary diseases: Secondary | ICD-10-CM | POA: Diagnosis not present

## 2018-09-25 DIAGNOSIS — H409 Unspecified glaucoma: Secondary | ICD-10-CM | POA: Diagnosis not present

## 2018-09-25 DIAGNOSIS — M069 Rheumatoid arthritis, unspecified: Secondary | ICD-10-CM | POA: Diagnosis not present

## 2018-09-25 DIAGNOSIS — I2510 Atherosclerotic heart disease of native coronary artery without angina pectoris: Secondary | ICD-10-CM | POA: Diagnosis not present

## 2018-09-25 DIAGNOSIS — J40 Bronchitis, not specified as acute or chronic: Secondary | ICD-10-CM | POA: Diagnosis not present

## 2018-09-25 DIAGNOSIS — J439 Emphysema, unspecified: Secondary | ICD-10-CM | POA: Diagnosis not present

## 2018-09-25 DIAGNOSIS — Z9981 Dependence on supplemental oxygen: Secondary | ICD-10-CM | POA: Diagnosis not present

## 2018-09-25 DIAGNOSIS — I5033 Acute on chronic diastolic (congestive) heart failure: Secondary | ICD-10-CM | POA: Diagnosis not present

## 2018-09-25 DIAGNOSIS — J45909 Unspecified asthma, uncomplicated: Secondary | ICD-10-CM | POA: Diagnosis not present

## 2018-09-25 DIAGNOSIS — K219 Gastro-esophageal reflux disease without esophagitis: Secondary | ICD-10-CM | POA: Diagnosis not present

## 2018-09-25 DIAGNOSIS — L989 Disorder of the skin and subcutaneous tissue, unspecified: Secondary | ICD-10-CM | POA: Diagnosis not present

## 2018-09-25 DIAGNOSIS — I110 Hypertensive heart disease with heart failure: Secondary | ICD-10-CM | POA: Diagnosis not present

## 2018-09-25 NOTE — Progress Notes (Signed)
Virtual Visit via Telephone Note  I connected with Joel Valdez on 09/25/18 at  1:20 PM EDT by telephone and verified that I am speaking with the correct person using two identifiers.  Location: Patient: Home -- Wife [name]) and Sister [name]) present for visit. Provider: [facility]   I discussed the limitations, risks, security and privacy concerns of performing an evaluation and management service by telephone and the availability of in person appointments. I also discussed with the patient that there may be a patient responsible charge related to this service. The patient expressed understanding and agreed to proceed.   History of Present Illness: Patient presents today with family via phone for hospital follow-up. Patient presented to ER via EMS on 09/13/2018 with worsening dyspnea. Workup at that time included labs (Na 131, K 3.9, Cl 91, Bicarb 28, Glucose 114, BUN 29, Cr 1.59, Hgb 9.7, platelets 371), CXR (increased interstitial markings bilaterally, improved from May 6), EKG (NSR with rate 72 bpm, left axis deviation). Patient admitted for acute on chronic hypoxic respiratory failure and to r/o recurrent pneumonia.   During hospitalization, further imaging obtained ruling out pneumonia so broad-spectrum ABX were discontinued. SARS COVID-19 negative. Blood culture negative. Acute on Chronic respiratory failure noted to be 2/2 exacerbation of his cor pulmonale. Was given aggressive IV diuresis with negative fluid balance achieved and significant improvement in all symptoms. O2 was weaned down to 2-3 L/min. Patient was discharged with increased dose of PO Lasix (40 mg daily). Renal function improved during admission back to baseline. A reactive leukocytosis was noted with WBC count at 16 on discharge. Patient restarted on home antihypertensives at discharge on 09/16/2018.  Since discharge, patient endorses taking medications as directed. Patient and family note legs are still  looking good. They are not strictly monitoring I/O but are keeping daily weights and notes he is down 3-4 more pounds since discharge. Is eating well currently but they are making sure to watch his salt intake. Notes breathing is still doing very well. Still having fatigue but feel this is slowly but surely improving. They have follow-up scheduled with Pulmonology (Dr. Luan Pulling) in the next few days. Note he is using Voltaren for MSK pain with some relief. The hydrocodone given on discharge was significantly helping and he is wondering if he can have a short-term refill of this medication. Denies any new or worsening symptoms. HH scheduled to resume care tomorrow per patient's wife and sister. They are using [facility] services. .    Observations/Objective: Patient is in no acute distress.  No labored breathing.  Speech is clear and coherent with logical content.  Patient is alert and oriented at baseline.   Assessment and Plan: 1. Acute on chronic diastolic CHF (congestive heart failure) ([place]) Continues diuresis at home. Family notes weight is still down and legs are not swollen. Continue Lasix as directed. Repeat BMP through [facility] tomorrow. Will schedule in-office follow-up once we have results and make additional changes if necessary  2. Essential hypertension, benign [place]. Will cut amlodipine in 1/2 for now. [place] RN to monitor BP this week and report levels. They are to do the same. [place] return/ER precautions reviewed with patient and family.  3. Pulmonary fibrosis ([place]) 4. Chronic respiratory failure with hypoxia (HCC) Stabilized at present. Continue O2 and chronic medications. Follow-up with Pulmonology as scheduled this week.  5. AKI (acute kidney injury) ([place]) Creatinine improved at discharge compared to ER stay. Will have [place] RN recheck BMP tomorrow when she  returns to the home.    Follow Up Instructions:  I discussed the assessment and treatment plan with the patient. The  patient was provided an opportunity to ask questions and all were answered. The patient agreed with the plan and demonstrated an understanding of the instructions.   The patient was advised to call back or seek an in-person evaluation if the symptoms worsen or if the condition fails to improve as anticipated.  I provided 25 minutes of non-face-to-face time during this encounter.   Leeanne Rio, PA-C

## 2018-09-26 ENCOUNTER — Telehealth: Payer: Self-pay | Admitting: Physician Assistant

## 2018-09-26 DIAGNOSIS — J9621 Acute and chronic respiratory failure with hypoxia: Secondary | ICD-10-CM | POA: Diagnosis not present

## 2018-09-26 DIAGNOSIS — J40 Bronchitis, not specified as acute or chronic: Secondary | ICD-10-CM | POA: Diagnosis not present

## 2018-09-26 DIAGNOSIS — I2510 Atherosclerotic heart disease of native coronary artery without angina pectoris: Secondary | ICD-10-CM | POA: Diagnosis not present

## 2018-09-26 DIAGNOSIS — E785 Hyperlipidemia, unspecified: Secondary | ICD-10-CM | POA: Diagnosis not present

## 2018-09-26 DIAGNOSIS — Z87891 Personal history of nicotine dependence: Secondary | ICD-10-CM | POA: Diagnosis not present

## 2018-09-26 DIAGNOSIS — I110 Hypertensive heart disease with heart failure: Secondary | ICD-10-CM | POA: Diagnosis not present

## 2018-09-26 DIAGNOSIS — I700 Atherosclerosis of aorta: Secondary | ICD-10-CM | POA: Diagnosis not present

## 2018-09-26 DIAGNOSIS — J61 Pneumoconiosis due to asbestos and other mineral fibers: Secondary | ICD-10-CM | POA: Diagnosis not present

## 2018-09-26 DIAGNOSIS — M069 Rheumatoid arthritis, unspecified: Secondary | ICD-10-CM | POA: Diagnosis not present

## 2018-09-26 DIAGNOSIS — H409 Unspecified glaucoma: Secondary | ICD-10-CM | POA: Diagnosis not present

## 2018-09-26 DIAGNOSIS — L989 Disorder of the skin and subcutaneous tissue, unspecified: Secondary | ICD-10-CM | POA: Diagnosis not present

## 2018-09-26 DIAGNOSIS — J439 Emphysema, unspecified: Secondary | ICD-10-CM | POA: Diagnosis not present

## 2018-09-26 DIAGNOSIS — J45909 Unspecified asthma, uncomplicated: Secondary | ICD-10-CM | POA: Diagnosis not present

## 2018-09-26 DIAGNOSIS — I5033 Acute on chronic diastolic (congestive) heart failure: Secondary | ICD-10-CM | POA: Diagnosis not present

## 2018-09-26 DIAGNOSIS — K219 Gastro-esophageal reflux disease without esophagitis: Secondary | ICD-10-CM | POA: Diagnosis not present

## 2018-09-26 DIAGNOSIS — J8489 Other specified interstitial pulmonary diseases: Secondary | ICD-10-CM | POA: Diagnosis not present

## 2018-09-26 DIAGNOSIS — Z9981 Dependence on supplemental oxygen: Secondary | ICD-10-CM | POA: Diagnosis not present

## 2018-09-26 LAB — CULTURE, BLOOD (ROUTINE X 2)
Culture: NO GROWTH
Culture: NO GROWTH
Special Requests: ADEQUATE
Special Requests: ADEQUATE

## 2018-09-26 NOTE — Telephone Encounter (Unsigned)
Copied from [place] [id number]. Topic: Quick Communication - Home Health Verbal Orders >> Sep 26, 2018 11:18 AM Scherrie Gerlach wrote: Caller/AgencyJosph Valdez /  [place] Number: [phone] Requesting :Skilled Nursing for medical and medication management for cardio pulmonary Frequency: 1 wk 1 3 wk 2 2 wk 3  1 wk 1  Ok to LM on secure VM

## 2018-09-26 NOTE — Telephone Encounter (Signed)
LM given the verbal ok

## 2018-09-26 NOTE — Telephone Encounter (Signed)
Ok for verbal orders ?

## 2018-09-27 ENCOUNTER — Telehealth: Payer: Self-pay | Admitting: *Deleted

## 2018-09-27 NOTE — Telephone Encounter (Signed)
Sharyn Lull with [facility] called for verbal orders for PT  2 X weekly for 2 weeks then 1 X weekly for 4 weeks.  I spoke with PCP and verbal order was given to [place].

## 2018-09-28 ENCOUNTER — Encounter: Payer: Self-pay | Admitting: Physician Assistant

## 2018-09-28 ENCOUNTER — Other Ambulatory Visit: Payer: Self-pay

## 2018-09-28 ENCOUNTER — Ambulatory Visit (INDEPENDENT_AMBULATORY_CARE_PROVIDER_SITE_OTHER): Payer: Medicare Other | Admitting: Physician Assistant

## 2018-09-28 VITALS — BP 108/72 | HR 80 | Temp 98.1°F | Resp 18 | Ht 66.0 in | Wt 151.0 lb

## 2018-09-28 DIAGNOSIS — I10 Essential (primary) hypertension: Secondary | ICD-10-CM

## 2018-09-28 DIAGNOSIS — J8410 Pulmonary fibrosis, unspecified: Secondary | ICD-10-CM

## 2018-09-28 DIAGNOSIS — I5033 Acute on chronic diastolic (congestive) heart failure: Secondary | ICD-10-CM

## 2018-09-28 MED ORDER — HYDROCODONE-HOMATROPINE 5-1.5 MG/5ML PO SYRP: 5.0000 mL | ORAL_SOLUTION | Freq: Three times a day (TID) | ORAL | 0 refills | Status: DC | PRN
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
## 2018-09-28 NOTE — Patient Instructions (Signed)
Please go to the lab today for blood work.  I will call you with your results. We will alter treatment regimen(s) if indicated by your results.   Stop the Lexapro and continue the Sertraline (Zoloft) daily. I will send in a different cough medication for you.   Keep up with daily weights. Keep consistent with food and low salt-intake.  We will call Monday morning to try and help you set up an appointment with Dr. Luan Pulling  We will stay off of the blood pressure medications for now.  Continue the fluid pills once daily as directed.   Follow-up with me via phone in 2 weeks.

## 2018-10-01 DIAGNOSIS — I700 Atherosclerosis of aorta: Secondary | ICD-10-CM | POA: Diagnosis not present

## 2018-10-01 DIAGNOSIS — K219 Gastro-esophageal reflux disease without esophagitis: Secondary | ICD-10-CM | POA: Diagnosis not present

## 2018-10-01 DIAGNOSIS — M069 Rheumatoid arthritis, unspecified: Secondary | ICD-10-CM | POA: Diagnosis not present

## 2018-10-01 DIAGNOSIS — J8489 Other specified interstitial pulmonary diseases: Secondary | ICD-10-CM | POA: Diagnosis not present

## 2018-10-01 DIAGNOSIS — I5033 Acute on chronic diastolic (congestive) heart failure: Secondary | ICD-10-CM | POA: Diagnosis not present

## 2018-10-01 DIAGNOSIS — I2510 Atherosclerotic heart disease of native coronary artery without angina pectoris: Secondary | ICD-10-CM | POA: Diagnosis not present

## 2018-10-01 DIAGNOSIS — L989 Disorder of the skin and subcutaneous tissue, unspecified: Secondary | ICD-10-CM | POA: Diagnosis not present

## 2018-10-01 DIAGNOSIS — E785 Hyperlipidemia, unspecified: Secondary | ICD-10-CM | POA: Diagnosis not present

## 2018-10-01 DIAGNOSIS — Z87891 Personal history of nicotine dependence: Secondary | ICD-10-CM | POA: Diagnosis not present

## 2018-10-01 DIAGNOSIS — Z9981 Dependence on supplemental oxygen: Secondary | ICD-10-CM | POA: Diagnosis not present

## 2018-10-01 DIAGNOSIS — J439 Emphysema, unspecified: Secondary | ICD-10-CM | POA: Diagnosis not present

## 2018-10-01 DIAGNOSIS — J45909 Unspecified asthma, uncomplicated: Secondary | ICD-10-CM | POA: Diagnosis not present

## 2018-10-01 DIAGNOSIS — J9621 Acute and chronic respiratory failure with hypoxia: Secondary | ICD-10-CM | POA: Diagnosis not present

## 2018-10-01 DIAGNOSIS — J40 Bronchitis, not specified as acute or chronic: Secondary | ICD-10-CM | POA: Diagnosis not present

## 2018-10-01 DIAGNOSIS — I110 Hypertensive heart disease with heart failure: Secondary | ICD-10-CM | POA: Diagnosis not present

## 2018-10-01 DIAGNOSIS — J61 Pneumoconiosis due to asbestos and other mineral fibers: Secondary | ICD-10-CM | POA: Diagnosis not present

## 2018-10-01 DIAGNOSIS — H409 Unspecified glaucoma: Secondary | ICD-10-CM | POA: Diagnosis not present

## 2018-10-01 LAB — COMPREHENSIVE METABOLIC PANEL
AG Ratio: 1.5 (calc) (ref 1.0–2.5)
ALT: 25 U/L (ref 9–46)
AST: 18 U/L (ref 10–35)
Albumin: 3.6 g/dL (ref 3.6–5.1)
Alkaline phosphatase (APISO): 42 U/L (ref 35–144)
BUN/Creatinine Ratio: 22 (calc) (ref 6–22)
BUN: 27 mg/dL — ABNORMAL HIGH (ref 7–25)
CO2: 22 mmol/L (ref 20–32)
Calcium: 8.8 mg/dL (ref 8.6–10.3)
Chloride: 100 mmol/L (ref 98–110)
Creat: 1.24 mg/dL — ABNORMAL HIGH (ref 0.70–1.18)
Globulin: 2.4 g/dL (calc) (ref 1.9–3.7)
Glucose, Bld: 122 mg/dL — ABNORMAL HIGH (ref 65–99)
Potassium: 4.4 mmol/L (ref 3.5–5.3)
Sodium: 135 mmol/L (ref 135–146)
Total Bilirubin: 0.4 mg/dL (ref 0.2–1.2)
Total Protein: 6 g/dL — ABNORMAL LOW (ref 6.1–8.1)

## 2018-10-01 LAB — CBC WITH DIFFERENTIAL/PLATELET
Absolute Monocytes: 395 cells/uL (ref 200–950)
Basophils Absolute: 127 cells/uL (ref 0–200)
Basophils Relative: 0.9 %
Eosinophils Absolute: 0 cells/uL — ABNORMAL LOW (ref 15–500)
Eosinophils Relative: 0 %
HCT: 32.5 % — ABNORMAL LOW (ref 38.5–50.0)
Hemoglobin: 10.6 g/dL — ABNORMAL LOW (ref 13.2–17.1)
Lymphs Abs: 1988 cells/uL (ref 850–3900)
MCH: 28.1 pg (ref 27.0–33.0)
MCHC: 32.6 g/dL (ref 32.0–36.0)
MCV: 86.2 fL (ref 80.0–100.0)
MPV: 9.6 fL (ref 7.5–12.5)
Monocytes Relative: 2.8 %
Neutro Abs: 11590 cells/uL — ABNORMAL HIGH (ref 1500–7800)
Neutrophils Relative %: 82.2 %
Platelets: 505 10*3/uL — ABNORMAL HIGH (ref 140–400)
RBC: 3.77 10*6/uL — ABNORMAL LOW (ref 4.20–5.80)
RDW: 19.2 % — ABNORMAL HIGH (ref 11.0–15.0)
Total Lymphocyte: 14.1 %
WBC: 14.1 10*3/uL — ABNORMAL HIGH (ref 3.8–10.8)

## 2018-10-02 ENCOUNTER — Other Ambulatory Visit: Payer: Self-pay | Admitting: Physician Assistant

## 2018-10-02 DIAGNOSIS — E782 Mixed hyperlipidemia: Secondary | ICD-10-CM

## 2018-10-03 DIAGNOSIS — M069 Rheumatoid arthritis, unspecified: Secondary | ICD-10-CM | POA: Diagnosis not present

## 2018-10-03 DIAGNOSIS — I5033 Acute on chronic diastolic (congestive) heart failure: Secondary | ICD-10-CM | POA: Diagnosis not present

## 2018-10-03 DIAGNOSIS — I110 Hypertensive heart disease with heart failure: Secondary | ICD-10-CM | POA: Diagnosis not present

## 2018-10-03 DIAGNOSIS — J45909 Unspecified asthma, uncomplicated: Secondary | ICD-10-CM | POA: Diagnosis not present

## 2018-10-03 DIAGNOSIS — H409 Unspecified glaucoma: Secondary | ICD-10-CM | POA: Diagnosis not present

## 2018-10-03 DIAGNOSIS — J439 Emphysema, unspecified: Secondary | ICD-10-CM | POA: Diagnosis not present

## 2018-10-03 DIAGNOSIS — Z9981 Dependence on supplemental oxygen: Secondary | ICD-10-CM | POA: Diagnosis not present

## 2018-10-03 DIAGNOSIS — I2510 Atherosclerotic heart disease of native coronary artery without angina pectoris: Secondary | ICD-10-CM | POA: Diagnosis not present

## 2018-10-03 DIAGNOSIS — E785 Hyperlipidemia, unspecified: Secondary | ICD-10-CM | POA: Diagnosis not present

## 2018-10-03 DIAGNOSIS — J40 Bronchitis, not specified as acute or chronic: Secondary | ICD-10-CM | POA: Diagnosis not present

## 2018-10-03 DIAGNOSIS — L989 Disorder of the skin and subcutaneous tissue, unspecified: Secondary | ICD-10-CM | POA: Diagnosis not present

## 2018-10-03 DIAGNOSIS — J9621 Acute and chronic respiratory failure with hypoxia: Secondary | ICD-10-CM | POA: Diagnosis not present

## 2018-10-03 DIAGNOSIS — Z87891 Personal history of nicotine dependence: Secondary | ICD-10-CM | POA: Diagnosis not present

## 2018-10-03 DIAGNOSIS — I700 Atherosclerosis of aorta: Secondary | ICD-10-CM | POA: Diagnosis not present

## 2018-10-03 DIAGNOSIS — J61 Pneumoconiosis due to asbestos and other mineral fibers: Secondary | ICD-10-CM | POA: Diagnosis not present

## 2018-10-03 DIAGNOSIS — J8489 Other specified interstitial pulmonary diseases: Secondary | ICD-10-CM | POA: Diagnosis not present

## 2018-10-03 DIAGNOSIS — K219 Gastro-esophageal reflux disease without esophagitis: Secondary | ICD-10-CM | POA: Diagnosis not present

## 2018-10-04 ENCOUNTER — Telehealth: Payer: Self-pay | Admitting: Emergency Medicine

## 2018-10-04 DIAGNOSIS — J8410 Pulmonary fibrosis, unspecified: Secondary | ICD-10-CM

## 2018-10-04 NOTE — Telephone Encounter (Signed)
FYI:  Spoke to patient spouse on Tuesday Patient blood pressure running 112/64, 118/66 Patient O2 running 92% on 4 L of oxygen Patient weight running 147 on 10/30/18 and 144 on Monday

## 2018-10-05 ENCOUNTER — Telehealth: Payer: Self-pay | Admitting: Physician Assistant

## 2018-10-05 NOTE — Telephone Encounter (Signed)
Please call to reassess that weigh is not continuing to climb

## 2018-10-05 NOTE — Telephone Encounter (Signed)
Noted.  Please call to see how they are doing.

## 2018-10-05 NOTE — Addendum Note (Signed)
Addended by: [name] on: 10/05/2018 03:47 PM   Modules accepted: Orders

## 2018-10-05 NOTE — Telephone Encounter (Signed)
Copied from [place] [id number]. Topic: General - Other >> Oct 05, 2018 12:30 PM Lennox Solders wrote: Reason for EHO:[id number] lpn [facility] is calling the family has stomach bug and did not want stephanie to come today. Colletta Maryland would like missed visit added plan of care. Colletta Maryland will see pt on monday

## 2018-10-05 NOTE — Telephone Encounter (Signed)
Possible ate some bad spaghetti. They had some nausea. No diarrhea or vomiting. Encouraged to increase fluids to flush out the bacteria. She had a fever but did break. They didn't want the nurse not to get anything from them.

## 2018-10-05 NOTE — Telephone Encounter (Signed)
Joel Valdez states his weight is running the same 144.4lb. He has been feeling weak. Staying in the bed. Some nausea due to stomach bug  Joel Valdez wanted a rx for face mask for his oxygen. His cannula came off during the night. They get their oxygen supplies thru [place].

## 2018-10-05 NOTE — Telephone Encounter (Signed)
Please advise 

## 2018-10-05 NOTE — Telephone Encounter (Signed)
Copied from [place] [id number]. Topic: General - Other >> Oct 05, 2018 11:36 AM Celene Kras A wrote: Reason for CRM: Clare Gandy, physical therapist at [facility], calling in a missed visit with pt. Tom states pts caregiver refused care because pt was too weak. Gershon Mussel states he offered to help and was denied. Please advise.

## 2018-10-05 NOTE — Telephone Encounter (Signed)
Order written and signed. Ok to fax to [facility].

## 2018-10-06 ENCOUNTER — Other Ambulatory Visit: Payer: Self-pay

## 2018-10-06 ENCOUNTER — Emergency Department (HOSPITAL_COMMUNITY): Payer: Medicare Other

## 2018-10-06 ENCOUNTER — Encounter (HOSPITAL_COMMUNITY): Payer: Self-pay | Admitting: *Deleted

## 2018-10-06 ENCOUNTER — Inpatient Hospital Stay (HOSPITAL_COMMUNITY)
Admission: EM | Admit: 2018-10-06 | Discharge: 2018-10-09 | DRG: 190 | Disposition: A | Discharge status: Home Health | Payer: Medicare Other | Attending: Family Medicine | Admitting: Family Medicine

## 2018-10-06 DIAGNOSIS — D72829 Elevated white blood cell count, unspecified: Secondary | ICD-10-CM | POA: Diagnosis not present

## 2018-10-06 DIAGNOSIS — R05 Cough: Secondary | ICD-10-CM | POA: Diagnosis not present

## 2018-10-06 DIAGNOSIS — Z7952 Long term (current) use of systemic steroids: Secondary | ICD-10-CM | POA: Diagnosis not present

## 2018-10-06 DIAGNOSIS — J61 Pneumoconiosis due to asbestos and other mineral fibers: Secondary | ICD-10-CM | POA: Diagnosis not present

## 2018-10-06 DIAGNOSIS — Z888 Allergy status to other drugs, medicaments and biological substances status: Secondary | ICD-10-CM

## 2018-10-06 DIAGNOSIS — Z79899 Other long term (current) drug therapy: Secondary | ICD-10-CM

## 2018-10-06 DIAGNOSIS — J441 Chronic obstructive pulmonary disease with (acute) exacerbation: Principal | ICD-10-CM | POA: Diagnosis present

## 2018-10-06 DIAGNOSIS — Z9981 Dependence on supplemental oxygen: Secondary | ICD-10-CM

## 2018-10-06 DIAGNOSIS — R413 Other amnesia: Secondary | ICD-10-CM | POA: Diagnosis present

## 2018-10-06 DIAGNOSIS — D473 Essential (hemorrhagic) thrombocythemia: Secondary | ICD-10-CM | POA: Diagnosis present

## 2018-10-06 DIAGNOSIS — E43 Unspecified severe protein-calorie malnutrition: Secondary | ICD-10-CM | POA: Diagnosis not present

## 2018-10-06 DIAGNOSIS — M06841 Other specified rheumatoid arthritis, right hand: Secondary | ICD-10-CM | POA: Diagnosis present

## 2018-10-06 DIAGNOSIS — R635 Abnormal weight gain: Secondary | ICD-10-CM

## 2018-10-06 DIAGNOSIS — F329 Major depressive disorder, single episode, unspecified: Secondary | ICD-10-CM | POA: Diagnosis present

## 2018-10-06 DIAGNOSIS — R0902 Hypoxemia: Secondary | ICD-10-CM | POA: Diagnosis not present

## 2018-10-06 DIAGNOSIS — H409 Unspecified glaucoma: Secondary | ICD-10-CM | POA: Diagnosis present

## 2018-10-06 DIAGNOSIS — R069 Unspecified abnormalities of breathing: Secondary | ICD-10-CM | POA: Diagnosis not present

## 2018-10-06 DIAGNOSIS — D696 Thrombocytopenia, unspecified: Secondary | ICD-10-CM | POA: Diagnosis not present

## 2018-10-06 DIAGNOSIS — Z8611 Personal history of tuberculosis: Secondary | ICD-10-CM

## 2018-10-06 DIAGNOSIS — I10 Essential (primary) hypertension: Secondary | ICD-10-CM | POA: Diagnosis present

## 2018-10-06 DIAGNOSIS — C61 Malignant neoplasm of prostate: Secondary | ICD-10-CM | POA: Diagnosis present

## 2018-10-06 DIAGNOSIS — R0602 Shortness of breath: Secondary | ICD-10-CM

## 2018-10-06 DIAGNOSIS — Z791 Long term (current) use of non-steroidal anti-inflammatories (NSAID): Secondary | ICD-10-CM

## 2018-10-06 DIAGNOSIS — Z87891 Personal history of nicotine dependence: Secondary | ICD-10-CM | POA: Diagnosis not present

## 2018-10-06 DIAGNOSIS — J9621 Acute and chronic respiratory failure with hypoxia: Secondary | ICD-10-CM | POA: Diagnosis not present

## 2018-10-06 DIAGNOSIS — M06842 Other specified rheumatoid arthritis, left hand: Secondary | ICD-10-CM | POA: Diagnosis not present

## 2018-10-06 DIAGNOSIS — Z20828 Contact with and (suspected) exposure to other viral communicable diseases: Secondary | ICD-10-CM | POA: Diagnosis present

## 2018-10-06 DIAGNOSIS — I110 Hypertensive heart disease with heart failure: Secondary | ICD-10-CM | POA: Diagnosis not present

## 2018-10-06 DIAGNOSIS — I5032 Chronic diastolic (congestive) heart failure: Secondary | ICD-10-CM | POA: Diagnosis not present

## 2018-10-06 DIAGNOSIS — I2781 Cor pulmonale (chronic): Secondary | ICD-10-CM | POA: Diagnosis present

## 2018-10-06 DIAGNOSIS — H9191 Unspecified hearing loss, right ear: Secondary | ICD-10-CM | POA: Diagnosis not present

## 2018-10-06 DIAGNOSIS — F0390 Unspecified dementia without behavioral disturbance: Secondary | ICD-10-CM | POA: Diagnosis present

## 2018-10-06 DIAGNOSIS — F32A Depression, unspecified: Secondary | ICD-10-CM | POA: Diagnosis present

## 2018-10-06 DIAGNOSIS — D649 Anemia, unspecified: Secondary | ICD-10-CM | POA: Diagnosis present

## 2018-10-06 DIAGNOSIS — J8410 Pulmonary fibrosis, unspecified: Secondary | ICD-10-CM | POA: Diagnosis not present

## 2018-10-06 DIAGNOSIS — E785 Hyperlipidemia, unspecified: Secondary | ICD-10-CM | POA: Diagnosis present

## 2018-10-06 DIAGNOSIS — R0689 Other abnormalities of breathing: Secondary | ICD-10-CM | POA: Diagnosis not present

## 2018-10-06 DIAGNOSIS — R000 Tachycardia, unspecified: Secondary | ICD-10-CM | POA: Diagnosis not present

## 2018-10-06 LAB — URINALYSIS, ROUTINE W REFLEX MICROSCOPIC
Bilirubin Urine: NEGATIVE
Glucose, UA: NEGATIVE mg/dL
Hgb urine dipstick: NEGATIVE
Ketones, ur: NEGATIVE mg/dL
Leukocytes,Ua: NEGATIVE
Nitrite: NEGATIVE
Protein, ur: NEGATIVE mg/dL
Specific Gravity, Urine: 1.019 (ref 1.005–1.030)
pH: 5 (ref 5.0–8.0)

## 2018-10-06 LAB — CBC WITH DIFFERENTIAL/PLATELET
Abs Immature Granulocytes: 0.25 10*3/uL — ABNORMAL HIGH (ref 0.00–0.07)
Basophils Absolute: 0.1 10*3/uL (ref 0.0–0.1)
Basophils Relative: 1 %
Eosinophils Absolute: 0.3 10*3/uL (ref 0.0–0.5)
Eosinophils Relative: 2 %
HCT: 33 % — ABNORMAL LOW (ref 39.0–52.0)
Hemoglobin: 10.7 g/dL — ABNORMAL LOW (ref 13.0–17.0)
Immature Granulocytes: 1 %
Lymphocytes Relative: 17 %
Lymphs Abs: 3.3 10*3/uL (ref 0.7–4.0)
MCH: 28.5 pg (ref 26.0–34.0)
MCHC: 32.4 g/dL (ref 30.0–36.0)
MCV: 88 fL (ref 80.0–100.0)
Monocytes Absolute: 1.7 10*3/uL — ABNORMAL HIGH (ref 0.1–1.0)
Monocytes Relative: 9 %
Neutro Abs: 13.9 10*3/uL — ABNORMAL HIGH (ref 1.7–7.7)
Neutrophils Relative %: 70 %
Platelets: 430 10*3/uL — ABNORMAL HIGH (ref 150–400)
RBC: 3.75 MIL/uL — ABNORMAL LOW (ref 4.22–5.81)
RDW: 19.5 % — ABNORMAL HIGH (ref 11.5–15.5)
WBC: 19.5 10*3/uL — ABNORMAL HIGH (ref 4.0–10.5)
nRBC: 0 % (ref 0.0–0.2)

## 2018-10-06 LAB — COMPREHENSIVE METABOLIC PANEL
ALT: 21 U/L (ref 0–44)
AST: 20 U/L (ref 15–41)
Albumin: 2.7 g/dL — ABNORMAL LOW (ref 3.5–5.0)
Alkaline Phosphatase: 54 U/L (ref 38–126)
Anion gap: 12 (ref 5–15)
BUN: 13 mg/dL (ref 8–23)
CO2: 26 mmol/L (ref 22–32)
Calcium: 8.4 mg/dL — ABNORMAL LOW (ref 8.9–10.3)
Chloride: 97 mmol/L — ABNORMAL LOW (ref 98–111)
Creatinine, Ser: 1.12 mg/dL (ref 0.61–1.24)
GFR calc Af Amer: >60 mL/min (ref >60)
GFR calc non Af Amer: >60 mL/min (ref >60)
Glucose, Bld: 122 mg/dL — ABNORMAL HIGH (ref 70–99)
Potassium: 3.6 mmol/L (ref 3.5–5.1)
Sodium: 135 mmol/L (ref 135–145)
Total Bilirubin: 0.5 mg/dL (ref 0.3–1.2)
Total Protein: 6.7 g/dL (ref 6.5–8.1)

## 2018-10-06 LAB — SARS CORONAVIRUS 2 BY RT PCR (HOSPITAL ORDER, PERFORMED IN ~~LOC~~ HOSPITAL LAB): SARS Coronavirus 2: NEGATIVE

## 2018-10-06 LAB — BRAIN NATRIURETIC PEPTIDE: B Natriuretic Peptide: 189 pg/mL — ABNORMAL HIGH (ref 0.0–100.0)

## 2018-10-06 LAB — TROPONIN I: Troponin I: <0.03 ng/mL (ref <0.03)

## 2018-10-06 MED ORDER — HYDROCODONE-ACETAMINOPHEN 5-325 MG PO TABS
1.0000 | ORAL_TABLET | Freq: Four times a day (QID) | ORAL | Status: DC | PRN
  Administered 2018-10-07 – 2018-10-08 (×3): 1 via ORAL
  Filled 2018-10-06 (×3): qty 1

## 2018-10-06 MED ORDER — GUAIFENESIN ER 600 MG PO TB12
600.0000 mg | ORAL_TABLET | Freq: Two times a day (BID) | ORAL | Status: DC
  Administered 2018-10-07 – 2018-10-09 (×6): 600 mg via ORAL
  Filled 2018-10-06 (×6): qty 1

## 2018-10-06 MED ORDER — PROCHLORPERAZINE EDISYLATE 10 MG/2ML IJ SOLN: 5.0000 mg | INTRAMUSCULAR | Status: DC | PRN

## 2018-10-06 MED ORDER — ENOXAPARIN SODIUM 40 MG/0.4ML ~~LOC~~ SOLN: 40.0000 mg | SUBCUTANEOUS | Status: DC

## 2018-10-06 MED ORDER — IPRATROPIUM-ALBUTEROL 0.5-2.5 (3) MG/3ML IN SOLN
3.0000 mL | Freq: Once | RESPIRATORY_TRACT | Status: AC
  Administered 2018-10-06: 3 mL via RESPIRATORY_TRACT
  Filled 2018-10-06: qty 3

## 2018-10-06 MED ORDER — PREDNISONE 20 MG PO TABS
40.0000 mg | ORAL_TABLET | Freq: Every day | ORAL | Status: DC
  Administered 2018-10-08 – 2018-10-09 (×2): 40 mg via ORAL
  Filled 2018-10-06 (×2): qty 2

## 2018-10-06 MED ORDER — TIMOLOL HEMIHYDRATE 0.5 % OP SOLN
1.0000 [drp] | Freq: Two times a day (BID) | OPHTHALMIC | Status: DC
  Administered 2018-10-07 – 2018-10-09 (×6): 1 [drp] via OPHTHALMIC
  Filled 2018-10-06 (×2): qty 5

## 2018-10-06 MED ORDER — FOLIC ACID 1 MG PO TABS
1.0000 mg | ORAL_TABLET | Freq: Every morning | ORAL | Status: DC
  Administered 2018-10-07 – 2018-10-09 (×3): 1 mg via ORAL
  Filled 2018-10-06 (×3): qty 1

## 2018-10-06 MED ORDER — ENOXAPARIN SODIUM 40 MG/0.4ML ~~LOC~~ SOLN
40.0000 mg | SUBCUTANEOUS | Status: DC
  Administered 2018-10-07 – 2018-10-08 (×3): 40 mg via SUBCUTANEOUS
  Filled 2018-10-06 (×3): qty 0.4

## 2018-10-06 MED ORDER — MAGNESIUM SULFATE 2 GM/50ML IV SOLN
2.0000 g | Freq: Once | INTRAVENOUS | Status: AC
  Administered 2018-10-07: 2 g via INTRAVENOUS
  Filled 2018-10-06: qty 50

## 2018-10-06 MED ORDER — LATANOPROST 0.005 % OP SOLN
1.0000 [drp] | Freq: Every day | OPHTHALMIC | Status: DC
  Administered 2018-10-07 – 2018-10-08 (×3): 1 [drp] via OPHTHALMIC
  Filled 2018-10-06 (×2): qty 2.5

## 2018-10-06 MED ORDER — FUROSEMIDE 20 MG PO TABS
20.0000 mg | ORAL_TABLET | Freq: Every morning | ORAL | Status: DC
  Administered 2018-10-08 – 2018-10-09 (×2): 20 mg via ORAL
  Filled 2018-10-06 (×2): qty 1

## 2018-10-06 MED ORDER — MORPHINE SULFATE (PF) 4 MG/ML IV SOLN
4.0000 mg | INTRAVENOUS | Status: AC | PRN
  Administered 2018-10-06 – 2018-10-07 (×2): 4 mg via INTRAVENOUS
  Filled 2018-10-06 (×2): qty 1

## 2018-10-06 MED ORDER — IPRATROPIUM-ALBUTEROL 0.5-2.5 (3) MG/3ML IN SOLN
3.0000 mL | Freq: Four times a day (QID) | RESPIRATORY_TRACT | Status: DC
  Administered 2018-10-07 (×4): 3 mL via RESPIRATORY_TRACT
  Filled 2018-10-06 (×4): qty 3

## 2018-10-06 MED ORDER — ALBUTEROL SULFATE (2.5 MG/3ML) 0.083% IN NEBU
2.5000 mg | INHALATION_SOLUTION | Freq: Once | RESPIRATORY_TRACT | Status: AC
  Administered 2018-10-06: 21:00:00 2.5 mg via RESPIRATORY_TRACT
  Filled 2018-10-06: qty 3

## 2018-10-06 MED ORDER — ESCITALOPRAM OXALATE 10 MG PO TABS
10.0000 mg | ORAL_TABLET | Freq: Every day | ORAL | Status: DC
  Administered 2018-10-07 – 2018-10-09 (×3): 10 mg via ORAL
  Filled 2018-10-06 (×3): qty 1

## 2018-10-06 MED ORDER — ACETAMINOPHEN 650 MG RE SUPP: 650.0000 mg | Freq: Four times a day (QID) | RECTAL | Status: DC | PRN

## 2018-10-06 MED ORDER — ATORVASTATIN CALCIUM 10 MG PO TABS
10.0000 mg | ORAL_TABLET | Freq: Every day | ORAL | Status: DC
  Administered 2018-10-07 – 2018-10-08 (×3): 10 mg via ORAL
  Filled 2018-10-06 (×3): qty 1

## 2018-10-06 MED ORDER — NITROGLYCERIN 0.4 MG SL SUBL
0.4000 mg | SUBLINGUAL_TABLET | SUBLINGUAL | Status: DC | PRN
  Administered 2018-10-06 (×2): 0.4 mg via SUBLINGUAL
  Filled 2018-10-06: qty 1

## 2018-10-06 MED ORDER — ACETAMINOPHEN 325 MG PO TABS: 650.0000 mg | ORAL_TABLET | Freq: Four times a day (QID) | ORAL | Status: DC | PRN

## 2018-10-06 MED ORDER — METHYLPREDNISOLONE SODIUM SUCC 40 MG IJ SOLR
40.0000 mg | Freq: Four times a day (QID) | INTRAMUSCULAR | Status: AC
  Administered 2018-10-07 (×4): 40 mg via INTRAVENOUS
  Filled 2018-10-06 (×4): qty 1

## 2018-10-06 MED ORDER — POTASSIUM CHLORIDE IN NACL 40-0.9 MEQ/L-% IV SOLN
INTRAVENOUS | Status: DC
  Administered 2018-10-07: 250 mL/h via INTRAVENOUS
  Filled 2018-10-06: qty 1000

## 2018-10-06 MED ORDER — POTASSIUM CHLORIDE CRYS ER 20 MEQ PO TBCR
20.0000 meq | EXTENDED_RELEASE_TABLET | Freq: Once | ORAL | Status: AC
  Administered 2018-10-07: 20 meq via ORAL
  Filled 2018-10-06: qty 1

## 2018-10-06 MED ORDER — METHYLPREDNISOLONE SODIUM SUCC 125 MG IJ SOLR
125.0000 mg | Freq: Once | INTRAMUSCULAR | Status: AC
  Administered 2018-10-06: 20:00:00 125 mg via INTRAVENOUS
  Filled 2018-10-06: qty 2

## 2018-10-06 MED ORDER — ASPIRIN 81 MG PO CHEW
324.0000 mg | CHEWABLE_TABLET | Freq: Once | ORAL | Status: AC
  Administered 2018-10-06: 324 mg via ORAL
  Filled 2018-10-06: qty 4

## 2018-10-06 NOTE — ED Notes (Signed)
Ambulated Pt on Pulse Ox. Pt Sats dropped to 78% O2 immediately while on 4L Nasal Cannula. MD Notified.

## 2018-10-06 NOTE — ED Notes (Signed)
Pt. O2 Saturation dropped upon doing orthostatics to 78% RN notified.
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
## 2018-10-06 NOTE — ED Notes (Signed)
ED Provider at bedside. 
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
## 2018-10-06 NOTE — ED Triage Notes (Signed)
Pt arrived to er by ems with c/o increase in sob, pt states that he has been using his inhalers at home with no improvement in symptoms, pulse ox on 2lpm [place] oxygen upon ems arrival to mid to upper 80's,

## 2018-10-07 ENCOUNTER — Encounter (HOSPITAL_COMMUNITY): Payer: Self-pay | Admitting: Family Medicine

## 2018-10-07 DIAGNOSIS — J9621 Acute and chronic respiratory failure with hypoxia: Secondary | ICD-10-CM

## 2018-10-07 DIAGNOSIS — D72829 Elevated white blood cell count, unspecified: Secondary | ICD-10-CM

## 2018-10-07 DIAGNOSIS — I10 Essential (primary) hypertension: Secondary | ICD-10-CM

## 2018-10-07 DIAGNOSIS — R413 Other amnesia: Secondary | ICD-10-CM

## 2018-10-07 DIAGNOSIS — H409 Unspecified glaucoma: Secondary | ICD-10-CM

## 2018-10-07 DIAGNOSIS — D649 Anemia, unspecified: Secondary | ICD-10-CM

## 2018-10-07 LAB — CBC WITH DIFFERENTIAL/PLATELET
Abs Immature Granulocytes: 0.19 10*3/uL — ABNORMAL HIGH (ref 0.00–0.07)
Basophils Absolute: 0.1 10*3/uL (ref 0.0–0.1)
Basophils Relative: 0 %
Eosinophils Absolute: 0 10*3/uL (ref 0.0–0.5)
Eosinophils Relative: 0 %
HCT: 31.5 % — ABNORMAL LOW (ref 39.0–52.0)
Hemoglobin: 9.7 g/dL — ABNORMAL LOW (ref 13.0–17.0)
Immature Granulocytes: 2 %
Lymphocytes Relative: 16 %
Lymphs Abs: 2.1 10*3/uL (ref 0.7–4.0)
MCH: 27.8 pg (ref 26.0–34.0)
MCHC: 30.8 g/dL (ref 30.0–36.0)
MCV: 90.3 fL (ref 80.0–100.0)
Monocytes Absolute: 0.1 10*3/uL (ref 0.1–1.0)
Monocytes Relative: 1 %
Neutro Abs: 10.4 10*3/uL — ABNORMAL HIGH (ref 1.7–7.7)
Neutrophils Relative %: 81 %
Platelets: 433 10*3/uL — ABNORMAL HIGH (ref 150–400)
RBC: 3.49 MIL/uL — ABNORMAL LOW (ref 4.22–5.81)
RDW: 19.1 % — ABNORMAL HIGH (ref 11.5–15.5)
WBC: 12.9 10*3/uL — ABNORMAL HIGH (ref 4.0–10.5)
nRBC: 0 % (ref 0.0–0.2)

## 2018-10-07 LAB — GLUCOSE, CAPILLARY
Glucose-Capillary: 136 mg/dL — ABNORMAL HIGH (ref 70–99)
Glucose-Capillary: 207 mg/dL — ABNORMAL HIGH (ref 70–99)
Glucose-Capillary: 152 mg/dL — ABNORMAL HIGH (ref 70–99)
Glucose-Capillary: 187 mg/dL — ABNORMAL HIGH (ref 70–99)

## 2018-10-07 MED ORDER — INSULIN ASPART 100 UNIT/ML ~~LOC~~ SOLN
0.0000 [IU] | Freq: Three times a day (TID) | SUBCUTANEOUS | Status: DC
  Administered 2018-10-07: 08:00:00 2 [IU] via SUBCUTANEOUS
  Administered 2018-10-07: 12:00:00 5 [IU] via SUBCUTANEOUS

## 2018-10-07 MED ORDER — ORAL CARE MOUTH RINSE
15.0000 mL | Freq: Two times a day (BID) | OROMUCOSAL | Status: DC
  Administered 2018-10-07 – 2018-10-09 (×6): 15 mL via OROMUCOSAL

## 2018-10-07 MED ORDER — GUAIFENESIN-DM 100-10 MG/5ML PO SYRP
5.0000 mL | ORAL_SOLUTION | ORAL | Status: DC | PRN
  Administered 2018-10-07 – 2018-10-09 (×4): 5 mL via ORAL
  Filled 2018-10-07 (×4): qty 5

## 2018-10-07 MED ORDER — INSULIN ASPART 100 UNIT/ML ~~LOC~~ SOLN
0.0000 [IU] | Freq: Three times a day (TID) | SUBCUTANEOUS | Status: DC
  Administered 2018-10-07: 17:00:00 4 [IU] via SUBCUTANEOUS
  Administered 2018-10-08 – 2018-10-09 (×3): 3 [IU] via SUBCUTANEOUS

## 2018-10-07 MED ORDER — DOXYCYCLINE HYCLATE 100 MG PO TABS
100.0000 mg | ORAL_TABLET | Freq: Two times a day (BID) | ORAL | Status: DC
  Administered 2018-10-07 – 2018-10-09 (×5): 100 mg via ORAL
  Filled 2018-10-07 (×5): qty 1

## 2018-10-07 MED ORDER — ALBUTEROL SULFATE (2.5 MG/3ML) 0.083% IN NEBU
2.5000 mg | INHALATION_SOLUTION | RESPIRATORY_TRACT | Status: DC | PRN
  Administered 2018-10-08: 12:00:00 2.5 mg via RESPIRATORY_TRACT
  Filled 2018-10-07: qty 3

## 2018-10-07 MED ORDER — IPRATROPIUM-ALBUTEROL 0.5-2.5 (3) MG/3ML IN SOLN
3.0000 mL | Freq: Four times a day (QID) | RESPIRATORY_TRACT | Status: DC
  Administered 2018-10-08 – 2018-10-09 (×5): 3 mL via RESPIRATORY_TRACT
  Filled 2018-10-07 (×5): qty 3

## 2018-10-07 MED ORDER — INSULIN ASPART 100 UNIT/ML ~~LOC~~ SOLN: 0.0000 [IU] | Freq: Every day | SUBCUTANEOUS | Status: DC

## 2018-10-07 MED ORDER — INSULIN ASPART 100 UNIT/ML ~~LOC~~ SOLN
3.0000 [IU] | Freq: Three times a day (TID) | SUBCUTANEOUS | Status: DC
  Administered 2018-10-07 – 2018-10-09 (×5): 3 [IU] via SUBCUTANEOUS

## 2018-10-07 NOTE — Progress Notes (Signed)
PROGRESS NOTE    [name]  JIR:[id number]  DOB: November 20, 1945  DOA: 10/06/2018 PCP: [name], PA-C   Brief Admission Hx: 73 year old gentleman with multiple recent hospitalizations for chronic lung disease including pulmonary fibrosis and history of pulmonary TB, asthma, glaucoma, who has been admitted approximately 6 times in the last months presents with shortness of breath.  He was noted to be hypoxic with ambulation on nasal cannula oxygen.  MDM/Assessment & Plan:   1. Acute on chronic respiratory failure with hypoxia- likely secondary to combination of exacerbation of chronic lung disease COPD and asthma- with multiple recent hospitalizations with continue to monitor him closely.  Continue IV steroids and oral doxycycline as ordered.  Continue neb treatments and supplemental oxygen.  Continue guaifenesin and cough syrup needed.  Will ask PT to evaluate for ambulation. 2. Glaucoma-resume home timolol and Xalatan eyedrops. 3. Essential hypertension- monitor blood pressure and resume home amlodipine. 4. Hyperlipidemia- continue atorvastatin 10 mg daily. 5. Leukocytosis-he has had recent prednisone use.  Monitor CBC. 6. Reactive thrombocytosis-we will monitor.  DVT prophylaxis: Lovenox Code Status: Full code Family Communication: Updated patient at bedside Disposition Plan: Continue current treatments and hospital   Consultants:    Procedures:    Antimicrobials:  Doxycycline 6/14 >  Subjective: Patient reports that he feels short of breath but it is slightly improved from admission in the ED.  Patient says he is coughing and requesting something for cough.  His cough is mostly nonproductive at this point.  He denies fever and chills.  He denies chest pain.  Objective: Vitals:   10/07/18 0104 10/07/18 0509 10/07/18 0512 10/07/18 0839  BP:  120/88 108/85   Pulse:  (!) 109 64   Resp:   20   Temp:   (!) 97.5 F (36.4 C)   TempSrc:   Oral   SpO2: 93% 98%   96%  Weight:      Height:        Intake/Output Summary (Last 24 hours) at 10/07/2018 1007 Last data filed at 10/07/2018 7510 Gross per 24 hour  Intake 404.36 ml  Output 701 ml  Net -296.64 ml   Filed Weights   10/06/18 1915 10/06/18 2345  Weight: 74.8 kg 65.3 kg     REVIEW OF SYSTEMS  As per history otherwise all reviewed and reported negative  Exam:  General exam: Awake, alert, sitting up in bed in no apparent distress, cooperative. Respiratory system: Bibasilar wheezes heard.  Mild tachypnea. Cardiovascular system: S1 & S2 heard. No JVD, murmurs, gallops, clicks or pedal edema. Gastrointestinal system: Abdomen is nondistended, soft and nontender. Normal bowel sounds heard.  Central nervous system: Alert and oriented. No focal neurological deficits. Extremities: no CCE.  Data Reviewed: Basic Metabolic Panel: Recent Labs  Lab 10/06/18 1919  NA 135  K 3.6  CL 97*  CO2 26  GLUCOSE 122*  BUN 13  CREATININE 1.12  CALCIUM 8.4*   Liver Function Tests: Recent Labs  Lab 10/06/18 1919  AST 20  ALT 21  ALKPHOS 54  BILITOT 0.5  PROT 6.7  ALBUMIN 2.7*   No results for input(s): LIPASE, AMYLASE in the last 168 hours. No results for input(s): AMMONIA in the last 168 hours. CBC: Recent Labs  Lab 10/06/18 1919 10/07/18 0705  WBC 19.5* 12.9*  NEUTROABS 13.9* 10.4*  HGB 10.7* 9.7*  HCT 33.0* 31.5*  MCV 88.0 90.3  PLT 430* 433*   Cardiac Enzymes: Recent Labs  Lab 10/06/18 1919  TROPONINI <0.03  CBG (last 3)  Recent Labs    10/07/18 0737  GLUCAP 136*   Recent Results (from the past 240 hour(s))  SARS Coronavirus 2 (CEPHEID - Performed in [place] hospital lab), Hosp Order     Status: None   Collection Time: 10/06/18  8:12 PM   Specimen: Nasopharyngeal Swab  Result Value Ref Range Status   SARS Coronavirus 2 NEGATIVE NEGATIVE Final    Comment: (NOTE) If result is NEGATIVE SARS-CoV-2 target nucleic acids are NOT DETECTED. The SARS-CoV-2 RNA is  generally detectable in upper and lower  respiratory specimens during the acute phase of infection. The lowest  concentration of SARS-CoV-2 viral copies this assay can detect is 250  copies / mL. A negative result does not preclude SARS-CoV-2 infection  and should not be used as the sole basis for treatment or other  patient management decisions.  A negative result may occur with  improper specimen collection / handling, submission of specimen other  than nasopharyngeal swab, presence of viral mutation(s) within the  areas targeted by this assay, and inadequate number of viral copies  (<250 copies / mL). A negative result must be combined with clinical  observations, patient history, and epidemiological information. If result is POSITIVE SARS-CoV-2 target nucleic acids are DETECTED. The SARS-CoV-2 RNA is generally detectable in upper and lower  respiratory specimens dur ing the acute phase of infection.  Positive  results are indicative of active infection with SARS-CoV-2.  Clinical  correlation with patient history and other diagnostic information is  necessary to determine patient infection status.  Positive results do  not rule out bacterial infection or co-infection with other viruses. If result is PRESUMPTIVE POSTIVE SARS-CoV-2 nucleic acids MAY BE PRESENT.   A presumptive positive result was obtained on the submitted specimen  and confirmed on repeat testing.  While 2019 novel coronavirus  (SARS-CoV-2) nucleic acids may be present in the submitted sample  additional confirmatory testing may be necessary for epidemiological  and / or clinical management purposes  to differentiate between  SARS-CoV-2 and other Sarbecovirus currently known to infect humans.  If clinically indicated additional testing with an alternate test  methodology [phone]) is advised. The SARS-CoV-2 RNA is generally  detectable in upper and lower respiratory sp ecimens during the acute  phase of infection.  The expected result is Negative. Fact Sheet for Patients:  StrictlyIdeas.no Fact Sheet for Healthcare Providers: BankingDealers.co.za This test is not yet approved or cleared by the [place] FDA and has been authorized for detection and/or diagnosis of SARS-CoV-2 by FDA under an Emergency Use Authorization (EUA).  This EUA will remain in effect (meaning this test can be used) for the duration of the COVID-19 declaration under Section 564(b)(1) of the Act, 21 U.S.C. section 360bbb-3(b)(1), unless the authorization is terminated or revoked sooner. Performed at [facility], [address], [place], [place] [address]      Studies: Dg Chest Portable 1 View  Result Date: 10/06/2018 CLINICAL DATA:  Short of breath and cough. History of pulmonary fibrosis. EXAM: PORTABLE CHEST 1 VIEW COMPARISON:  09/21/2018 and older exams. FINDINGS: Cardiac silhouette is normal in size. No mediastinal or hilar masses. There are irregularly thickened interstitial markings areas of hazy intervening ground-glass type opacities, left greater than right, similar to the prior exam consistent with advanced interstitial fibrosis. No convincing acute infiltrate and no evidence of pulmonary edema. No pleural effusion or pneumothorax. Skeletal structures are grossly intact. IMPRESSION: 1. No acute cardiopulmonary disease. 2. Advanced interstitial fibrosis.  Electronically Signed   By: Lajean Manes M.D.   On: 10/06/2018 19:52   Scheduled Meds: . atorvastatin  10 mg Oral QHS  . doxycycline  100 mg Oral Q12H  . enoxaparin (LOVENOX) injection  40 mg Subcutaneous Q24H  . escitalopram  10 mg Oral Daily  . folic acid  1 mg Oral q morning - 10a  . [START ON 10/08/2018] furosemide  20 mg Oral q morning - 10a  . guaiFENesin  600 mg Oral BID  . insulin aspart  0-15 Units Subcutaneous TID WC  . ipratropium-albuterol  3 mL Nebulization Q6H  . latanoprost  1 drop Both Eyes QHS  .  mouth rinse  15 mL Mouth Rinse BID  . methylPREDNISolone (SOLU-MEDROL) injection  40 mg Intravenous Q6H   Followed by  . [START ON 10/08/2018] predniSONE  40 mg Oral Q breakfast  . timolol  1 drop Both Eyes BID   Continuous Infusions:  Principal Problem:   COPD exacerbation (HCC) Active Problems:   Glaucoma   Essential hypertension, benign   Depression   Hyperlipidemia   Memory loss   Pulmonary fibrosis (HCC)   Leukocytosis   Normocytic anemia   Thrombocytopenia ([place])  Time spent:   Irwin Brakeman, MD [facility] 10/07/2018, 10:07 AM    LOS: 0 days  How to contact the [facility] Attending or Consulting provider [place] or covering provider during after hours [place], for this patient?  1. Check the care team in [facility] and look for a) attending/consulting [facility] provider listed and b) the [facility] team listed 2. Log into [email or web address] and use [place]'s universal password to access. If you do not have the password, please contact the hospital operator. 3. Locate the [facility] provider you are looking for under [facility] and page to a number that you can be directly reached. 4. If you still have difficulty reaching the provider, please page the [facility] (Director on Call) for the Hospitalists listed on amion for assistance.

## 2018-10-08 DIAGNOSIS — D473 Essential (hemorrhagic) thrombocythemia: Secondary | ICD-10-CM | POA: Diagnosis present

## 2018-10-08 DIAGNOSIS — I10 Essential (primary) hypertension: Secondary | ICD-10-CM | POA: Diagnosis not present

## 2018-10-08 DIAGNOSIS — Z87891 Personal history of nicotine dependence: Secondary | ICD-10-CM | POA: Diagnosis not present

## 2018-10-08 DIAGNOSIS — I5032 Chronic diastolic (congestive) heart failure: Secondary | ICD-10-CM | POA: Diagnosis present

## 2018-10-08 DIAGNOSIS — I2781 Cor pulmonale (chronic): Secondary | ICD-10-CM | POA: Diagnosis present

## 2018-10-08 DIAGNOSIS — J441 Chronic obstructive pulmonary disease with (acute) exacerbation: Secondary | ICD-10-CM | POA: Diagnosis not present

## 2018-10-08 DIAGNOSIS — Z791 Long term (current) use of non-steroidal anti-inflammatories (NSAID): Secondary | ICD-10-CM | POA: Diagnosis not present

## 2018-10-08 DIAGNOSIS — Z8611 Personal history of tuberculosis: Secondary | ICD-10-CM | POA: Diagnosis not present

## 2018-10-08 DIAGNOSIS — M06841 Other specified rheumatoid arthritis, right hand: Secondary | ICD-10-CM | POA: Diagnosis present

## 2018-10-08 DIAGNOSIS — I110 Hypertensive heart disease with heart failure: Secondary | ICD-10-CM | POA: Diagnosis present

## 2018-10-08 DIAGNOSIS — Z79899 Other long term (current) drug therapy: Secondary | ICD-10-CM | POA: Diagnosis not present

## 2018-10-08 DIAGNOSIS — D72829 Elevated white blood cell count, unspecified: Secondary | ICD-10-CM | POA: Diagnosis not present

## 2018-10-08 DIAGNOSIS — E43 Unspecified severe protein-calorie malnutrition: Secondary | ICD-10-CM | POA: Insufficient documentation

## 2018-10-08 DIAGNOSIS — Z888 Allergy status to other drugs, medicaments and biological substances status: Secondary | ICD-10-CM | POA: Diagnosis not present

## 2018-10-08 DIAGNOSIS — J61 Pneumoconiosis due to asbestos and other mineral fibers: Secondary | ICD-10-CM | POA: Diagnosis present

## 2018-10-08 DIAGNOSIS — R0602 Shortness of breath: Secondary | ICD-10-CM | POA: Diagnosis present

## 2018-10-08 DIAGNOSIS — Z9981 Dependence on supplemental oxygen: Secondary | ICD-10-CM | POA: Diagnosis not present

## 2018-10-08 DIAGNOSIS — F0390 Unspecified dementia without behavioral disturbance: Secondary | ICD-10-CM | POA: Diagnosis present

## 2018-10-08 DIAGNOSIS — E785 Hyperlipidemia, unspecified: Secondary | ICD-10-CM | POA: Diagnosis not present

## 2018-10-08 DIAGNOSIS — Z7952 Long term (current) use of systemic steroids: Secondary | ICD-10-CM | POA: Diagnosis not present

## 2018-10-08 DIAGNOSIS — F329 Major depressive disorder, single episode, unspecified: Secondary | ICD-10-CM | POA: Diagnosis present

## 2018-10-08 DIAGNOSIS — J9621 Acute and chronic respiratory failure with hypoxia: Secondary | ICD-10-CM | POA: Diagnosis present

## 2018-10-08 DIAGNOSIS — M06842 Other specified rheumatoid arthritis, left hand: Secondary | ICD-10-CM | POA: Diagnosis present

## 2018-10-08 DIAGNOSIS — J8410 Pulmonary fibrosis, unspecified: Secondary | ICD-10-CM | POA: Diagnosis present

## 2018-10-08 DIAGNOSIS — Z20828 Contact with and (suspected) exposure to other viral communicable diseases: Secondary | ICD-10-CM | POA: Diagnosis present

## 2018-10-08 DIAGNOSIS — H409 Unspecified glaucoma: Secondary | ICD-10-CM | POA: Diagnosis not present

## 2018-10-08 DIAGNOSIS — H9191 Unspecified hearing loss, right ear: Secondary | ICD-10-CM | POA: Diagnosis present

## 2018-10-08 DIAGNOSIS — C61 Malignant neoplasm of prostate: Secondary | ICD-10-CM | POA: Diagnosis present

## 2018-10-08 LAB — GLUCOSE, CAPILLARY
Glucose-Capillary: 137 mg/dL — ABNORMAL HIGH (ref 70–99)
Glucose-Capillary: 145 mg/dL — ABNORMAL HIGH (ref 70–99)
Glucose-Capillary: 102 mg/dL — ABNORMAL HIGH (ref 70–99)
Glucose-Capillary: 145 mg/dL — ABNORMAL HIGH (ref 70–99)
Glucose-Capillary: 141 mg/dL — ABNORMAL HIGH (ref 70–99)

## 2018-10-08 MED ORDER — ENSURE ENLIVE PO LIQD
237.0000 mL | Freq: Two times a day (BID) | ORAL | Status: DC
  Administered 2018-10-08 – 2018-10-09 (×2): 237 mL via ORAL

## 2018-10-08 MED ORDER — ADULT MULTIVITAMIN W/MINERALS CH
1.0000 | ORAL_TABLET | Freq: Every day | ORAL | Status: DC
  Administered 2018-10-08 – 2018-10-09 (×2): 1 via ORAL
  Filled 2018-10-08 (×2): qty 1

## 2018-10-08 NOTE — Progress Notes (Signed)
PROGRESS NOTE    Joel Valdez  VFI:[id number]  DOB: 19-Jun-1945  DOA: 10/06/2018 PCP: [name], PA-C   Brief Admission Hx: 73 year old gentleman with multiple recent hospitalizations for chronic lung disease including pulmonary fibrosis and history of pulmonary TB, asthma, glaucoma, who has been admitted approximately 6 times in the last months presents with shortness of breath.  He was noted to be hypoxic with ambulation on nasal cannula oxygen.  MDM/Assessment & Plan:   1. Acute on chronic respiratory failure with hypoxia- likely secondary to combination of exacerbation of chronic lung disease COPD and asthma- with multiple recent hospitalizations with continue to monitor him closely.  Continue IV steroids and oral doxycycline as ordered.  Continue neb treatments and supplemental oxygen.  Continue guaifenesin and cough syrup needed.  Will ask PT to evaluate for ambulation.  He says he does not feel he is well enough to manage at home.  2. Glaucoma-resume home timolol and Xalatan eyedrops. 3. Essential hypertension- stable blood pressure continuing home amlodipine. 4. Hyperlipidemia- continue atorvastatin 10 mg daily. 5. Leukocytosis-he has had recent prednisone use.  Monitor CBC. 6. Reactive thrombocytosis.  DVT prophylaxis: Lovenox Code Status: Full code Family Communication: wife/telephone Disposition Plan: Continue current treatments and hospital   Consultants:    Procedures:    Antimicrobials:  Doxycycline 6/14 >  Subjective: Patient says he is still very SOB. He denies chest pain and palpitations.  Objective: Vitals:   10/07/18 2116 10/08/18 0608 10/08/18 0732 10/08/18 0851  BP: 114/67 112/73    Pulse: 72 78    Resp:  19    Temp: 97.9 F (36.6 C) 97.9 F (36.6 C)    TempSrc: Oral Oral    SpO2: 99% 97% 99%   Weight:    66.1 kg  Height:    5\' 7"  (1.702 m)    Intake/Output Summary (Last 24 hours) at 10/08/2018 0951 Last data filed at 10/07/2018  1700 Gross per 24 hour  Intake 480 ml  Output 150 ml  Net 330 ml   Filed Weights   10/06/18 1915 10/06/18 2345 10/08/18 0851  Weight: 74.8 kg 65.3 kg 66.1 kg     REVIEW OF SYSTEMS  As per history otherwise all reviewed and reported negative  Exam:  General exam: Awake, alert, sitting up in bed in no apparent distress, cooperative. Respiratory system: Bibasilar wheezes heard but better air movement,  Mild tachypnea. Cardiovascular system: S1 & S2 heard. No JVD, murmurs, gallops, clicks or pedal edema. Gastrointestinal system: Abdomen is nondistended, soft and nontender. Normal bowel sounds heard.  Central nervous system: Alert and oriented. No focal neurological deficits. Extremities: no CCE.  Data Reviewed: Basic Metabolic Panel: Recent Labs  Lab 10/06/18 1919  NA 135  K 3.6  CL 97*  CO2 26  GLUCOSE 122*  BUN 13  CREATININE 1.12  CALCIUM 8.4*   Liver Function Tests: Recent Labs  Lab 10/06/18 1919  AST 20  ALT 21  ALKPHOS 54  BILITOT 0.5  PROT 6.7  ALBUMIN 2.7*   No results for input(s): LIPASE, AMYLASE in the last 168 hours. No results for input(s): AMMONIA in the last 168 hours. CBC: Recent Labs  Lab 10/06/18 1919 10/07/18 0705  WBC 19.5* 12.9*  NEUTROABS 13.9* 10.4*  HGB 10.7* 9.7*  HCT 33.0* 31.5*  MCV 88.0 90.3  PLT 430* 433*   Cardiac Enzymes: Recent Labs  Lab 10/06/18 1919  TROPONINI <0.03   CBG (last 3)  Recent Labs    10/07/18 2117  10/08/18 0256 10/08/18 0721  GLUCAP 187* 137* 145*   Recent Results (from the past 240 hour(s))  SARS Coronavirus 2 (CEPHEID - Performed in [facility] hospital lab), Hosp Order     Status: None   Collection Time: 10/06/18  8:12 PM   Specimen: Nasopharyngeal Swab  Result Value Ref Range Status   SARS Coronavirus 2 NEGATIVE NEGATIVE Final    Comment: (NOTE) If result is NEGATIVE SARS-CoV-2 target nucleic acids are NOT DETECTED. The SARS-CoV-2 RNA is generally detectable in upper and lower   respiratory specimens during the acute phase of infection. The lowest  concentration of SARS-CoV-2 viral copies this assay can detect is 250  copies / mL. A negative result does not preclude SARS-CoV-2 infection  and should not be used as the sole basis for treatment or other  patient management decisions.  A negative result may occur with  improper specimen collection / handling, submission of specimen other  than nasopharyngeal swab, presence of viral mutation(s) within the  areas targeted by this assay, and inadequate number of viral copies  (<250 copies / mL). A negative result must be combined with clinical  observations, patient history, and epidemiological information. If result is POSITIVE SARS-CoV-2 target nucleic acids are DETECTED. The SARS-CoV-2 RNA is generally detectable in upper and lower  respiratory specimens dur ing the acute phase of infection.  Positive  results are indicative of active infection with SARS-CoV-2.  Clinical  correlation with patient history and other diagnostic information is  necessary to determine patient infection status.  Positive results do  not rule out bacterial infection or co-infection with other viruses. If result is PRESUMPTIVE POSTIVE SARS-CoV-2 nucleic acids MAY BE PRESENT.   A presumptive positive result was obtained on the submitted specimen  and confirmed on repeat testing.  While 2019 novel coronavirus  (SARS-CoV-2) nucleic acids may be present in the submitted sample  additional confirmatory testing may be necessary for epidemiological  and / or clinical management purposes  to differentiate between  SARS-CoV-2 and other Sarbecovirus currently known to infect humans.  If clinically indicated additional testing with an alternate test  methodology [phone]) is advised. The SARS-CoV-2 RNA is generally  detectable in upper and lower respiratory sp ecimens during the acute  phase of infection. The expected result is Negative. Fact  Sheet for Patients:  StrictlyIdeas.no Fact Sheet for Healthcare Providers: BankingDealers.co.za This test is not yet approved or cleared by the [place] FDA and has been authorized for detection and/or diagnosis of SARS-CoV-2 by FDA under an Emergency Use Authorization (EUA).  This EUA will remain in effect (meaning this test can be used) for the duration of the COVID-19 declaration under Section 564(b)(1) of the Act, 21 U.S.C. section 360bbb-3(b)(1), unless the authorization is terminated or revoked sooner. Performed at [facility], [address] [place], [place] [address]      Studies: Dg Chest Portable 1 View  Result Date: 10/06/2018 CLINICAL DATA:  Short of breath and cough. History of pulmonary fibrosis. EXAM: PORTABLE CHEST 1 VIEW COMPARISON:  09/21/2018 and older exams. FINDINGS: Cardiac silhouette is normal in size. No mediastinal or hilar masses. There are irregularly thickened interstitial markings areas of hazy intervening ground-glass type opacities, left greater than right, similar to the prior exam consistent with advanced interstitial fibrosis. No convincing acute infiltrate and no evidence of pulmonary edema. No pleural effusion or pneumothorax. Skeletal structures are grossly intact. IMPRESSION: 1. No acute cardiopulmonary disease. 2. Advanced interstitial fibrosis. Electronically Signed   By:  Lajean Manes M.D.   On: 10/06/2018 19:52   Scheduled Meds: . atorvastatin  10 mg Oral QHS  . doxycycline  100 mg Oral Q12H  . enoxaparin (LOVENOX) injection  40 mg Subcutaneous Q24H  . escitalopram  10 mg Oral Daily  . folic acid  1 mg Oral q morning - 10a  . furosemide  20 mg Oral q morning - 10a  . guaiFENesin  600 mg Oral BID  . insulin aspart  0-20 Units Subcutaneous TID WC  . insulin aspart  0-5 Units Subcutaneous QHS  . insulin aspart  3 Units Subcutaneous TID WC  . ipratropium-albuterol  3 mL Nebulization Q6H WA  .  latanoprost  1 drop Both Eyes QHS  . mouth rinse  15 mL Mouth Rinse BID  . predniSONE  40 mg Oral Q breakfast  . timolol  1 drop Both Eyes BID   Continuous Infusions:  Principal Problem:   COPD exacerbation (HCC) Active Problems:   Glaucoma   Essential hypertension, benign   Depression   Hyperlipidemia   Memory loss   Pulmonary fibrosis (HCC)   Leukocytosis   Normocytic anemia   Thrombocytopenia ([place])  Time spent:   Irwin Brakeman, MD [facility] 10/08/2018, 9:51 AM    LOS: 0 days  How to contact the [facility] Attending or Consulting provider [place] or covering provider during after hours [place], for this patient?  1. Check the care team in [facility] and look for a) attending/consulting TR[facility] provider listed and b) the [facility] team listed 2. Log into [email or web address] and use [place]'s universal password to access. If you do not have the password, please contact the hospital operator. 3. Locate the [facility] provider you are looking for under [facility] and page to a number that you can be directly reached. 4. If you still have difficulty reaching the provider, please page the [facility] [place] (Director on Call) for the Hospitalists listed on amion for assistance.
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
## 2018-10-08 NOTE — Progress Notes (Signed)
Initial Nutrition Assessment  DOCUMENTATION CODES:   Severe malnutrition in context of chronic illness  INTERVENTION:  Ensure Enlive po BID, each supplement provides 350 kcal and 20 grams of protein. Strongly recommend pt continue with oral supplements at home given his historically poor intake. Drinking his calories will be less "work" for him as well.  MVI daily   NUTRITION DIAGNOSIS:   Severe Malnutrition related to chronic illness(severe pulmonary fibrosis) as evidenced by energy intake < or equal to 75% for > or equal to 1 month, severe fat depletion, severe muscle depletion.   GOAL:   Patient will meet greater than or equal to 90% of their needs  MONITOR:   PO intake, Supplement acceptance, Weight trends  REASON FOR ASSESSMENT:   Consult COPD Protocol, Assessment of nutrition requirement/status  ASSESSMENT: Patient is a 73 yo male from home with spouse and has a hx of CHF, Prostate cancer ([place]), GERD, Hypertension and COPD (severe pulmonary fibrosis).   Patient has multiple acute illness admission in the past few months. In April he was admitted to [facility] for 16 days due to bilateral pneumonia. He was hospitalized again  From 5/7-5/14 with dyspnea -acute on chronic respiratory failure. On 5/21 he was admitted for 3 days acute on chronic hypoxic respiratory failure due to core pulmonale exacerbation, acute on chronic CHF.   Patient returned on 5/29-5/31 AKI, SOB and acute on chronic respiratory failure with hypoxia.  Patient has rheumatoid arthritis in both hands but able to feed himself. His appetite is fair at best. Average daily meal pattern is breakfast and dinner. He has not been drinking oral nutrition shakes but has some at home. Encouraged him to drink one mid-day since he usually skips eating during those hours. Emphasized to importance of protein and energy intake and his increased needs associated with his chronic pulmonary fibrosis.    Weight history review shows  significant gain from 58 kg in April to current 66 kg. Patient has moderate to severe muscle and fat loss overall. His nutrition intake is chronically suboptimal and he meet criteria for severe malnutrition associated with chronic respiratory disease.     Medications reviewed and include: lasix, folvite, lipitor, insulin  Labs: BMP Latest Ref Rng & Units 10/06/2018 09/28/2018 09/23/2018  Glucose 70 - 99 mg/dL 122(H) 122(H) 149(H)  BUN 8 - 23 mg/dL 13 27(H) 27(H)  Creatinine 0.61 - 1.24 mg/dL 1.12 1.24(H) 1.10  BUN/Creat Ratio 6 - 22 (calc) - 22 -  Sodium 135 - 145 mmol/L 135 135 135  Potassium 3.5 - 5.1 mmol/L 3.6 4.4 3.9  Chloride 98 - 111 mmol/L 97(L) 100 99  CO2 22 - 32 mmol/L 26 22 22   Calcium 8.9 - 10.3 mg/dL 8.4(L) 8.8 8.7(L)     NUTRITION - FOCUSED PHYSICAL EXAM:    Most Recent Value  Orbital Region  Severe depletion  Upper Arm Region  Moderate depletion  Thoracic and Lumbar Region  Severe depletion  Buccal Region  Moderate depletion  Temple Region  Severe depletion  Clavicle Bone Region  Moderate depletion  Clavicle and Acromion Bone Region  Severe depletion  Scapular Bone Region  Severe depletion  Dorsal Hand  Mild depletion  Patellar Region  Severe depletion  Anterior Thigh Region  Severe depletion  Edema (RD Assessment)  Moderate  Hair  Reviewed  Eyes  Reviewed  Skin  Reviewed       Diet Order:   Diet Order  Diet Heart Room service appropriate? Yes; Fluid consistency: Thin  Diet effective now              EDUCATION NEEDS:   Education needs have been addressed Skin:  Skin Assessment: Reviewed RN Assessment  Last BM:  unknown  Height:   Ht Readings from Last 1 Encounters:  10/08/18 5\' 7"  (1.702 m)    Weight:   Wt Readings from Last 1 Encounters:  10/08/18 66.1 kg    Ideal Body Weight:  67 kg  BMI:  Body mass index is 22.82 kg/m.  Estimated Nutritional Needs:   Kcal:  1537-9432 (30-35 kcal/kg/bw)  Protein:  99-112 (1.5-1.7  gr/kg/bw)  Fluid:  <2 liters daily   Colman Cater MS,RD,CSG,LDN Office: [phone] Pager: [phone]

## 2018-10-08 NOTE — TOC Initial Note (Signed)
Transition of Care (TOC) - Initial/Assessment Note    Patient Details  Name: Joel Valdez MRN: [id number] Date of Birth: Nov 15, 1945  Transition of Care [facility]) CM/SW Contact:    Boneta Lucks, RN Phone Number: 10/08/2018, 11:36 AM  Clinical Narrative:     Patient admitted for COPD exacerbation. PT recommended HH.  Patient is active with [facility] for RN and PT.  Per wife she will be moving patient to [place] to live with his sister [name] as soon as she can get his equipment moved.  Called Tim with [facility] to make him aware of the move.             Expected Discharge Plan: [place]     Patient Goals and CMS Choice Patient states their goals for this hospitalization and ongoing recovery are:: to go home and continue Home health care.      Expected Discharge Plan and Services Expected Discharge Plan: [place]: [facility] Date [place]: 10/08/18 Time [place]: 1132 Representative spoke with at [place]: TIm  Prior Living Arrangements/Services   Lives with:: Spouse Patient language and need for interpreter reviewed:: Yes Do you feel safe going back to the place where you live?: Yes      Need for Family Participation in Patient Care: Yes (Comment) Care giver support system in place?: Yes (comment) Current home services: Home PT, Home RN Criminal Activity/Legal Involvement Pertinent to Current Situation/Hospitalization: No - Comment as needed  Activities of Daily Living Home Assistive Devices/Equipment: Oxygen, Hearing aid ADL Screening (condition at time of admission) Patient's cognitive ability adequate to safely complete daily activities?: Yes Is the patient deaf or have difficulty hearing?: Yes Does the patient have difficulty seeing, even when wearing glasses/contacts?: No Does the patient have difficulty  concentrating, remembering, or making decisions?: No Patient able to express need for assistance with ADLs?: Yes Does the patient have difficulty dressing or bathing?: No Independently performs ADLs?: Yes (appropriate for developmental age) Does the patient have difficulty walking or climbing stairs?: Yes Weakness of Legs: Both Weakness of Arms/Hands: Both  Permission Sought/Granted                  Emotional Assessment       Orientation: : Oriented to Self, Oriented to Place, Oriented to  Time, Oriented to Situation   Psych Involvement: No (comment)  Admission diagnosis:  Shortness of breath [R06.02] Weight gain [R63.5] Hypoxia [R09.02] COPD with acute exacerbation ([place]) [J44.1] Patient Active Problem List   Diagnosis Date Noted  . COPD exacerbation ([place]) 10/06/2018  . Leukocytosis 10/06/2018  . Normocytic anemia 10/06/2018  . Thrombocytopenia ([place]) 10/06/2018  . AKI (acute kidney injury) ([place]) 09/15/2018  . Acute on chronic diastolic CHF (congestive heart failure) ([place]) 09/06/2018  . Abnormal thyroid function test 08/29/2018  . Chronic respiratory failure with hypoxia ([place]) 08/29/2018  . Cor pulmonale (chronic) ([place]) 08/29/2018  . Acute prerenal azotemia 08/23/2018  . Pulmonary fibrosis ([place]) 08/23/2018  . Pressure injury of skin 08/23/2018  . Memory loss 03/30/2018  . Hyperlipidemia 04/17/2016  . Pelvic lymphadenopathy   . Rheumatoid arthritis involving both hands ([place]) 01/26/2015  . Depression 11/21/2014  . Prostate cancer ([place]) 04/28/2014  .  Bruit 11/13/2013  . Abnormal electrocardiogram 11/13/2013  . Glaucoma 09/27/2013  . Essential hypertension, benign 09/27/2013  . Nocturia 09/27/2013  . Colon cancer screening 09/27/2013   PCP:  [name], PA-C Pharmacy:   [place], [place] - [address] [place] [address] Phone: [phone] Fax: [phone]     Social Determinants of Health ([place])  Interventions    Readmission Risk Interventions Readmission Risk Prevention Plan 09/16/2018 09/05/2018 08/31/2018  Transportation Screening Complete - Complete  PCP or Specialist Appt within 3-5 Days - - Complete  HRI or [place] - - Complete  Social Work Consult for [place] Planning/Counseling - - Complete  Palliative Care Screening - - Not Applicable  Medication Review Press photographer) Complete - Complete  PCP or Specialist appointment within 3-5 days of discharge Complete Complete -  [place] or Home Care Consult Complete Complete -  SW Recovery Care/Counseling Consult Complete Complete -  Palliative Care Screening Not Applicable Not Applicable -  [place] Not Applicable Not Applicable -  Some recent data might be hidden

## 2018-10-08 NOTE — Plan of Care (Signed)
  Problem: Acute Rehab PT Goals(only PT should resolve) Goal: Patient Will Transfer Sit To/From Stand Outcome: Progressing Flowsheets (Taken 10/08/2018 1001) Patient will transfer sit to/from stand: Independently Goal: Pt Will Transfer Bed To Chair/Chair To Bed Outcome: Progressing Flowsheets (Taken 10/08/2018 1001) Pt will Transfer Bed to Chair/Chair to Bed: Independently Goal: Pt Will Ambulate Outcome: Progressing Flowsheets (Taken 10/08/2018 1001) Pt will Ambulate:  with modified independence  100 feet   10:02 AM, 10/08/18 Joel Valdez, MPT Physical Therapist with [facility] [phone] office [phone] mobile phone
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
## 2018-10-08 NOTE — Care Management Obs Status (Signed)
[place] NOTIFICATION   Patient Details  Name: Joel Valdez. Petersen MRN: [id number] Date of Birth: Mar 01, 1946   Medicare Observation Status Notification Given:  Yes    Tommy Medal 10/08/2018, 9:08 AM

## 2018-10-08 NOTE — Telephone Encounter (Signed)
Will fax orders to [facility] once patient is out of the hospital

## 2018-10-09 DIAGNOSIS — E43 Unspecified severe protein-calorie malnutrition: Secondary | ICD-10-CM

## 2018-10-09 DIAGNOSIS — D696 Thrombocytopenia, unspecified: Secondary | ICD-10-CM

## 2018-10-09 DIAGNOSIS — J8410 Pulmonary fibrosis, unspecified: Secondary | ICD-10-CM

## 2018-10-09 LAB — GLUCOSE, CAPILLARY
Glucose-Capillary: 113 mg/dL — ABNORMAL HIGH (ref 70–99)
Glucose-Capillary: 103 mg/dL — ABNORMAL HIGH (ref 70–99)
Glucose-Capillary: 134 mg/dL — ABNORMAL HIGH (ref 70–99)

## 2018-10-09 LAB — HIV ANTIBODY (ROUTINE TESTING W REFLEX): HIV Screen 4th Generation wRfx: NONREACTIVE

## 2018-10-09 MED ORDER — DICLOFENAC SODIUM 1 % TD GEL: 4.0000 g | Freq: Four times a day (QID) | TRANSDERMAL | Status: AC | PRN

## 2018-10-09 MED ORDER — PREDNISONE 20 MG PO TABS: 40.0000 mg | ORAL_TABLET | Freq: Every day | ORAL | 0 refills | Status: DC

## 2018-10-09 MED ORDER — DOXYCYCLINE HYCLATE 100 MG PO TABS: 100.0000 mg | ORAL_TABLET | Freq: Two times a day (BID) | ORAL | 0 refills | Status: AC
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
## 2018-10-09 NOTE — Plan of Care (Signed)
  Problem: Education: Goal: Knowledge of General Education information will improve Description: Including pain rating scale, medication(s)/side effects and non-pharmacologic comfort measures 10/09/2018 1254 by Rance Muir, RN Outcome: Adequate for Discharge 10/09/2018 0952 by Rance Muir, RN Outcome: Progressing   Problem: Health Behavior/Discharge Planning: Goal: Ability to manage health-related needs will improve 10/09/2018 1254 by Rance Muir, RN Outcome: Adequate for Discharge 10/09/2018 0952 by Rance Muir, RN Outcome: Progressing   Problem: Clinical Measurements: Goal: Ability to maintain clinical measurements within normal limits will improve 10/09/2018 1254 by Rance Muir, RN Outcome: Adequate for Discharge 10/09/2018 0952 by Rance Muir, RN Outcome: Progressing Goal: Will remain free from infection 10/09/2018 1254 by Rance Muir, RN Outcome: Adequate for Discharge 10/09/2018 0952 by Rance Muir, RN Outcome: Progressing Goal: Diagnostic test results will improve 10/09/2018 1254 by Rance Muir, RN Outcome: Adequate for Discharge 10/09/2018 0952 by Rance Muir, RN Outcome: Progressing Goal: Respiratory complications will improve 10/09/2018 1254 by Rance Muir, RN Outcome: Adequate for Discharge 10/09/2018 0952 by Rance Muir, RN Outcome: Progressing Goal: Cardiovascular complication will be avoided 10/09/2018 1254 by Rance Muir, RN Outcome: Adequate for Discharge 10/09/2018 0952 by Rance Muir, RN Outcome: Progressing   Problem: Activity: Goal: Risk for activity intolerance will decrease 10/09/2018 1254 by Rance Muir, RN Outcome: Adequate for Discharge 10/09/2018 [phone] by Rance Muir, RN Outcome: Progressing   Problem: Nutrition: Goal: Adequate nutrition will be maintained 10/09/2018 1254 by Rance Muir, RN Outcome: Adequate for Discharge 10/09/2018 0952 by Rance Muir, RN Outcome: Progressing   Problem: Coping: Goal: Level of anxiety will decrease 10/09/2018 1254 by Rance Muir, RN Outcome: Adequate for  Discharge 10/09/2018 0952 by Rance Muir, RN Outcome: Progressing   Problem: Elimination: Goal: Will not experience complications related to bowel motility 10/09/2018 1254 by Rance Muir, RN Outcome: Adequate for Discharge 10/09/2018 [place] by Rance Muir, RN Outcome: Progressing Goal: Will not experience complications related to urinary retention 10/09/2018 1254 by Rance Muir, RN Outcome: Adequate for Discharge 10/09/2018 0952 by Rance Muir, RN Outcome: Progressing   Problem: Pain Managment: Goal: General experience of comfort will improve 10/09/2018 1254 by Rance Muir, RN Outcome: Adequate for Discharge 10/09/2018 0952 by Rance Muir, RN Outcome: Progressing   Problem: Safety: Goal: Ability to remain free from injury will improve 10/09/2018 1254 by Rance Muir, RN Outcome: Adequate for Discharge 10/09/2018 0952 by Rance Muir, RN Outcome: Progressing   Problem: Skin Integrity: Goal: Risk for impaired skin integrity will decrease 10/09/2018 1254 by Rance Muir, RN Outcome: Adequate for Discharge 10/09/2018 0952 by Rance Muir, RN Outcome: Progressing
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
## 2018-10-09 NOTE — TOC Transition Note (Signed)
Transition of Care [facility]) - CM/SW Discharge Note   Patient Details  Name: Joel Valdez. [place] MRN: [id number] Date of Birth: 1945-06-15  Transition of Care [facility]) CM/SW Contact:  Boneta Lucks, RN Phone Number: 10/09/2018, 1:32 PM   Clinical Narrative:   Patient discharging today.  High risk assessment has been done. Patient has follow up appointments. [facility] with [facility] Orders are in and Octavia Bruckner is aware of discharge today.     Final next level of care: [place] Barriers to Discharge: No Barriers Identified   Patient Goals and CMS Choice Patient states their goals for this hospitalization and ongoing recovery are:: to go home and continue Home health care.      Discharge Placement                                                   [place] Agency: [facility] Date [place]: 10/09/18 Time [place]: [place] Representative spoke with at [place]: TIm  Social Determinants of Health ([place]) Interventions     Readmission Risk Interventions Readmission Risk Prevention Plan 10/09/2018 10/08/2018 09/16/2018  Transportation Screening - Complete Complete  PCP or Specialist Appt within 3-5 Days - - -  HRI or [place] for [place] - - -  Medication Review Press photographer) - Complete Complete  PCP or Specialist appointment within 3-5 days of discharge Complete Not Complete Complete  HRI or [place] - Complete Complete  SW Recovery Care/Counseling Consult - Complete Complete  Palliative Care Screening - Complete Not [place] - Not Complete Not Applicable  Some recent data might be hidden

## 2018-10-09 NOTE — Discharge Instructions (Signed)
Acute Respiratory Distress Syndrome, Adult ° °Acute respiratory distress syndrome is a life-threatening condition in which fluid collects in the lungs. This prevents the lungs from filling with air and passing oxygen into the blood. This can cause the lungs and other vital organs to fail. The condition usually develops following an infection, illness, surgery, or injury. °What are the causes? °This condition may be caused by: °· An infection, such as sepsis or pneumonia. °· A serious injury to the head or chest. °· Severe bleeding from an injury. °· A major surgery. °· Breathing in harmful chemicals or smoke. °· Blood transfusions. °· A blood clot in the lungs. °· Breathing in vomit (aspiration). °· Near-drowning. °· Inflammation of the pancreas (pancreatitis). °· A drug overdose. °What are the signs or symptoms? °Sudden shortness of breath and rapid breathing are the main symptoms of this condition. Other symptoms may include: °· A fast or irregular heartbeat. °· Skin, lips, or fingernails that look blue (cyanosis). °· Confusion. °· Tiredness or loss of energy. °· Chest pain, particularly while taking a breath. °· Coughing. °· Restlessness or anxiety. °· Fever. This is usually present if there is an underlying infection, such as pneumonia. °How is this diagnosed? °This condition is diagnosed based on: °· Your symptoms. °· Medical history. °· A physical exam. During the exam, your health care provider will listen to your heart and check for crackling or wheezing sounds in your lungs. °You may also have other tests to confirm the diagnosis and measure how well your lungs are working. These may include: °· Measuring the amount of oxygen in your blood. Your health care provider will use two methods to do this procedure: °? A small device (pulse oximeter) that is placed on your finger, earlobe, or toe. °? An arterial blood gas test. A sample of blood is taken from an artery and tested for oxygen levels. °· Blood  tests. °· Chest X-rays or CT scans to look for fluid in the lungs. °· Taking a sample of your sputum to test for infection. °· Heart test, such as an echocardiogram or electrocardiogram. This is done to rule out any heart problems (such as heart failure) that may be causing your symptoms. °· Bronchoscopy. During this test, a thin, flexible tube with a light is passed into the mouth or nose, down the windpipe, and into the lungs. °How is this treated? °Treatment depends on the cause of your condition. The goal is to support you while your lungs heal and the underlying cause is treated. Treatment may include: °· Oxygen therapy. This may be done through: °? A tube in your nose or a face mask. °? A ventilator. This device helps move air into and out of your lungs through a breathing tube that is inserted into your mouth or nose. °· Continuous positive airway pressure (CPAP). This treatment uses mild air pressure to keep the airways open. A mask or other device will be placed over your nose or mouth. °· Tracheostomy. During this procedure, a small cut is made in your neck to create an opening to your windpipe. A breathing tube is placed directly into your windpipe. The breathing tube is connected to a ventilator. This is done if you have problems with your airway or if you need a ventilator for a long period of time. °· Positioning you to lie on your stomach (prone position). °· Medicines, such as: °? Sedatives to help you relax. °? Blood pressure medicines. °? Antibiotics to treat infection. °? Blood   thinners to prevent blood clots. ? Diuretics to help prevent excess fluid.  Fluids and nutrients given through an IV tube.  Wearing compression stockings on your legs to prevent blood clots.  Extra corporeal membrane oxygenation (ECMO). This treatment takes blood outside your body, adds oxygen, and removes carbon dioxide. The blood is then returned to your body. This treatment is only used in severe cases. Follow  these instructions at home:  Take over-the-counter and prescription medicines only as told by your health care provider.  Do not use any products that contain nicotine or tobacco, such as cigarettes and e-cigarettes. If you need help quitting, ask your health care provider.  Limit alcohol intake to no more than 1 drink per day for nonpregnant women and 2 drinks per day for men. One drink equals 12 oz of beer, 5 oz of wine, or 1 oz of hard liquor.  Ask friends and family to help you if daily activities make you tired.  Attend any pulmonary rehabilitation as told by your health care provider. This may include: ? Education about your condition. ? Exercises. ? Breathing training. ? Counseling. ? Learning techniques to conserve energy. ? Nutrition counseling.  Keep all follow-up visits as told by your health care provider. This is important. Contact a health care provider if:  You become short of breath during activity or while resting.  You develop a cough that does not go away.  You have a fever.  Your symptoms do not get better or they get worse.  You become anxious or depressed. Get help right away if:  You have sudden shortness of breath.  You develop sudden chest pain that does not go away.  You develop a rapid heart rate.  You develop swelling or pain in one of your legs.  You cough up blood.  You have trouble breathing.  Your skin, lips, or fingernails turn blue. These symptoms may represent a serious problem that is an emergency. Do not wait to see if the symptoms will go away. Get medical help right away. Call your local emergency services (911 in the [place] Do not drive yourself to the hospital. Summary  Acute respiratory distress syndrome is a life-threatening condition in which fluid collects in the lungs, which leads the lungs and other vital organs to fail.  This condition usually develops following an infection, illness, surgery, or injury.  Sudden  shortness of breath and rapid breathing are the main symptoms of acute respiratory distress syndrome.  Treatment may include oxygen therapy, continuous positive airway pressure (CPAP), tracheostomy, lying on your stomach (prone position), medicines, fluids and nutrients given through an IV tube, compression stockings, and extra corporeal membrane oxygenation (ECMO). This information is not intended to replace advice given to you by your health care provider. Make sure you discuss any questions you have with your health care provider. Document Released: 04/11/2005 Document Revised: 03/28/2016 Document Reviewed: 03/28/2016 Elsevier Interactive Patient Education  2019 [employer]   Chronic Obstructive Pulmonary Disease Chronic obstructive pulmonary disease (COPD) is a long-term (chronic) lung problem. When you have COPD, it is hard for air to get in and out of your lungs. Usually the condition gets worse over time, and your lungs will never return to normal. There are things you can do to keep yourself as healthy as possible.  Your doctor may treat your condition with: ? Medicines. ? Oxygen. ? Lung surgery.  Your doctor may also recommend: ? Rehabilitation. This includes steps to make your body work better.  It may involve a team of specialists. ? Quitting smoking, if you smoke. ? Exercise and changes to your diet. ? Comfort measures (palliative care). Follow these instructions at home: Medicines  Take over-the-counter and prescription medicines only as told by your doctor.  Talk to your doctor before taking any cough or allergy medicines. You may need to avoid medicines that cause your lungs to be dry. Lifestyle  If you smoke, stop. Smoking makes the problem worse. If you need help quitting, ask your doctor.  Avoid being around things that make your breathing worse. This may include smoke, chemicals, and fumes.  Stay active, but remember to rest as well.  Learn and use tips on how  to relax.  Make sure you get enough sleep. Most adults need at least 7 hours of sleep every night.  Eat healthy foods. Eat smaller meals more often. Rest before meals. Controlled breathing Learn and use tips on how to control your breathing as told by your doctor. Try:  Breathing in (inhaling) through your nose for 1 second. Then, pucker your lips and breath out (exhale) through your lips for 2 seconds.  Putting one hand on your belly (abdomen). Breathe in slowly through your nose for 1 second. Your hand on your belly should move out. Pucker your lips and breathe out slowly through your lips. Your hand on your belly should move in as you breathe out.  Controlled coughing Learn and use controlled coughing to clear mucus from your lungs. Follow these steps: 1. Lean your head a little forward. 2. Breathe in deeply. 3. Try to hold your breath for 3 seconds. 4. Keep your mouth slightly open while coughing 2 times. 5. Spit any mucus out into a tissue. 6. Rest and do the steps again 1 or 2 times as needed. General instructions  Make sure you get all the shots (vaccines) that your doctor recommends. Ask your doctor about a flu shot and a pneumonia shot.  Use oxygen therapy and pulmonary rehabilitation if told by your doctor. If you need home oxygen therapy, ask your doctor if you should buy a tool to measure your oxygen level (oximeter).  Make a COPD action plan with your doctor. This helps you to know what to do if you feel worse than usual.  Manage any other conditions you have as told by your doctor.  Avoid going outside when it is very hot, cold, or humid.  Avoid people who have a sickness you can catch (contagious).  Keep all follow-up visits as told by your doctor. This is important. Contact a doctor if:  You cough up more mucus than usual.  There is a change in the color or thickness of the mucus.  It is harder to breathe than usual.  Your breathing is faster than  usual.  You have trouble sleeping.  You need to use your medicines more often than usual.  You have trouble doing your normal activities such as getting dressed or walking around the house. Get help right away if:  You have shortness of breath while resting.  You have shortness of breath that stops you from: ? Being able to talk. ? Doing normal activities.  Your chest hurts for longer than 5 minutes.  Your skin color is more blue than usual.  Your pulse oximeter shows that you have low oxygen for longer than 5 minutes.  You have a fever.  You feel too tired to breathe normally. Summary  Chronic obstructive pulmonary disease (COPD) is  a long-term lung problem.  The way your lungs work will never return to normal. Usually the condition gets worse over time. There are things you can do to keep yourself as healthy as possible.  Take over-the-counter and prescription medicines only as told by your doctor.  If you smoke, stop. Smoking makes the problem worse. This information is not intended to replace advice given to you by your health care provider. Make sure you discuss any questions you have with your health care provider. Document Released: 09/28/2007 Document Revised: 05/16/2016 Document Reviewed: 05/16/2016 Elsevier Interactive Patient Education  2019 [employer].   Shortness of Breath, Adult Shortness of breath means you have trouble breathing. Shortness of breath could be a sign of a medical problem. Follow these instructions at home:   Watch for any changes in your symptoms.  Do not use any products that contain nicotine or tobacco, such as cigarettes, e-cigarettes, and chewing tobacco.  Do not smoke. Smoking can cause shortness of breath. If you need help to quit smoking, ask your doctor.  Avoid things that can make it harder to breathe, such as: ? Mold. ? Dust. ? Air pollution. ? Chemical smells. ? Things that can cause allergy symptoms (allergens), if  you have allergies.  Keep your living space clean. Use products that help remove mold and dust.  Rest as needed. Slowly return to your normal activities.  Take over-the-counter and prescription medicines only as told by your doctor. This includes oxygen therapy and inhaled medicines.  Keep all follow-up visits as told by your doctor. This is important. Contact a doctor if:  Your condition does not get better as soon as expected.  You have a hard time doing your normal activities, even after you rest.  You have new symptoms. Get help right away if:  Your shortness of breath gets worse.  You have trouble breathing when you are resting.  You feel light-headed or you pass out (faint).  You have a cough that is not helped by medicines.  You cough up blood.  You have pain with breathing.  You have pain in your chest, arms, shoulders, or belly (abdomen).  You have a fever.  You cannot walk up stairs.  You cannot exercise the way you normally do. These symptoms may represent a serious problem that is an emergency. Do not wait to see if the symptoms will go away. Get medical help right away. Call your local emergency services (911 in the [place] Do not drive yourself to the hospital. Summary  Shortness of breath is when you have trouble breathing enough air. It can be a sign of a medical problem.  Avoid things that make it hard for you to breathe, such as smoking, pollution, mold, and dust.  Watch for any changes in your symptoms. Contact your doctor if you do not get better or you get worse. This information is not intended to replace advice given to you by your health care provider. Make sure you discuss any questions you have with your health care provider. Document Released: 09/28/2007 Document Revised: 09/11/2017 Document Reviewed: 09/11/2017 Elsevier Interactive Patient Education  2019 [place] INFORMATION: PAY CLOSE ATTENTION   PHYSICIAN DISCHARGE  INSTRUCTIONS  Follow with Primary care provider  Delorse Limber  and other consultants as instructed by your Hospitalist Physician  [place] IF SYMPTOMS COME BACK, WORSEN OR NEW PROBLEM DEVELOPS   Please note: You were cared for by  a hospitalist during your hospital stay. Every effort will be made to forward records to your primary care provider.  You can request that your primary care provider send for your hospital records if they have not received them.  Once you are discharged, your primary care physician will handle any further medical issues. Please note that NO REFILLS for any discharge medications will be authorized once you are discharged, as it is imperative that you return to your primary care physician (or establish a relationship with a primary care physician if you do not have one) for your post hospital discharge needs so that they can reassess your need for medications and monitor your lab values.  Please get a complete blood count and chemistry panel checked by your Primary MD at your next visit, and again as instructed by your Primary MD.  Get Medicines reviewed and adjusted: Please take all your medications with you for your next visit with your Primary MD  Laboratory/radiological data: Please request your Primary MD to go over all hospital tests and procedure/radiological results at the follow up, please ask your primary care provider to get all Hospital records sent to his/her office.  In some cases, they will be blood work, cultures and biopsy results pending at the time of your discharge. Please request that your primary care provider follow up on these results.  If you are diabetic, please bring your blood sugar readings with you to your follow up appointment with primary care.    Please call and make your follow up appointments as soon as possible.    Also Note the following: If you experience worsening of your admission  symptoms, develop shortness of breath, life threatening emergency, suicidal or homicidal thoughts you must seek medical attention immediately by calling 911 or calling your MD immediately  if symptoms less severe.  You must read complete instructions/literature along with all the possible adverse reactions/side effects for all the Medicines you take and that have been prescribed to you. Take any new Medicines after you have completely understood and accpet all the possible adverse reactions/side effects.   Do not drive when taking Pain medications or sleeping medications (Benzodiazepines)  Do not take more than prescribed Pain, Sleep and Anxiety Medications. It is not advisable to combine anxiety,sleep and pain medications without talking with your primary care practitioner  Special Instructions: If you have smoked or chewed Tobacco  in the last 2 yrs please stop smoking, stop any regular Alcohol  and or any Recreational drug use.  Wear Seat belts while driving.  Do not drive if taking any narcotic, mind altering or controlled substances or recreational drugs or alcohol.

## 2018-10-09 NOTE — Plan of Care (Signed)

## 2018-10-10 ENCOUNTER — Telehealth: Payer: Self-pay

## 2018-10-10 NOTE — Telephone Encounter (Signed)
Spoke with patients wife, [name].  VS today: 135/80, HR 95, T97.8, Pox 90% Wife states patient is doing better, now taking prednisone and doxycycline. Voiced no needs at this time.  Appt scheduled with PCP on 10/12/2018.

## 2018-10-12 ENCOUNTER — Ambulatory Visit (INDEPENDENT_AMBULATORY_CARE_PROVIDER_SITE_OTHER): Payer: Medicare Other | Admitting: Physician Assistant

## 2018-10-12 ENCOUNTER — Other Ambulatory Visit: Payer: Self-pay

## 2018-10-12 ENCOUNTER — Encounter: Payer: Self-pay | Admitting: Physician Assistant

## 2018-10-12 VITALS — BP 110/60 | HR 80 | Temp 95.6°F | Wt 144.2 lb

## 2018-10-12 DIAGNOSIS — J61 Pneumoconiosis due to asbestos and other mineral fibers: Secondary | ICD-10-CM | POA: Diagnosis not present

## 2018-10-12 DIAGNOSIS — R0789 Other chest pain: Secondary | ICD-10-CM

## 2018-10-12 DIAGNOSIS — D75839 Thrombocytosis, unspecified: Secondary | ICD-10-CM

## 2018-10-12 DIAGNOSIS — I2510 Atherosclerotic heart disease of native coronary artery without angina pectoris: Secondary | ICD-10-CM | POA: Diagnosis not present

## 2018-10-12 DIAGNOSIS — D473 Essential (hemorrhagic) thrombocythemia: Secondary | ICD-10-CM | POA: Diagnosis not present

## 2018-10-12 DIAGNOSIS — I5033 Acute on chronic diastolic (congestive) heart failure: Secondary | ICD-10-CM | POA: Diagnosis not present

## 2018-10-12 DIAGNOSIS — M069 Rheumatoid arthritis, unspecified: Secondary | ICD-10-CM | POA: Diagnosis not present

## 2018-10-12 DIAGNOSIS — J45909 Unspecified asthma, uncomplicated: Secondary | ICD-10-CM | POA: Diagnosis not present

## 2018-10-12 DIAGNOSIS — Z9981 Dependence on supplemental oxygen: Secondary | ICD-10-CM | POA: Diagnosis not present

## 2018-10-12 DIAGNOSIS — J9621 Acute and chronic respiratory failure with hypoxia: Secondary | ICD-10-CM

## 2018-10-12 DIAGNOSIS — J40 Bronchitis, not specified as acute or chronic: Secondary | ICD-10-CM | POA: Diagnosis not present

## 2018-10-12 DIAGNOSIS — H409 Unspecified glaucoma: Secondary | ICD-10-CM | POA: Diagnosis not present

## 2018-10-12 DIAGNOSIS — E785 Hyperlipidemia, unspecified: Secondary | ICD-10-CM | POA: Diagnosis not present

## 2018-10-12 DIAGNOSIS — I110 Hypertensive heart disease with heart failure: Secondary | ICD-10-CM | POA: Diagnosis not present

## 2018-10-12 DIAGNOSIS — I700 Atherosclerosis of aorta: Secondary | ICD-10-CM | POA: Diagnosis not present

## 2018-10-12 DIAGNOSIS — Z87891 Personal history of nicotine dependence: Secondary | ICD-10-CM | POA: Diagnosis not present

## 2018-10-12 DIAGNOSIS — J8410 Pulmonary fibrosis, unspecified: Secondary | ICD-10-CM | POA: Diagnosis not present

## 2018-10-12 DIAGNOSIS — J439 Emphysema, unspecified: Secondary | ICD-10-CM | POA: Diagnosis not present

## 2018-10-12 DIAGNOSIS — L989 Disorder of the skin and subcutaneous tissue, unspecified: Secondary | ICD-10-CM | POA: Diagnosis not present

## 2018-10-12 DIAGNOSIS — K219 Gastro-esophageal reflux disease without esophagitis: Secondary | ICD-10-CM | POA: Diagnosis not present

## 2018-10-12 DIAGNOSIS — J441 Chronic obstructive pulmonary disease with (acute) exacerbation: Secondary | ICD-10-CM | POA: Diagnosis not present

## 2018-10-12 DIAGNOSIS — J8489 Other specified interstitial pulmonary diseases: Secondary | ICD-10-CM | POA: Diagnosis not present

## 2018-10-12 NOTE — Progress Notes (Signed)
I have discussed the procedure for the virtual visit with the patient who has given consent to proceed with assessment and treatment.   Jasiel Apachito S Brit Carbonell, CMA     
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
## 2018-10-15 DIAGNOSIS — J8410 Pulmonary fibrosis, unspecified: Secondary | ICD-10-CM | POA: Diagnosis not present

## 2018-10-16 ENCOUNTER — Telehealth: Payer: Self-pay | Admitting: Physician Assistant

## 2018-10-16 DIAGNOSIS — J84112 Idiopathic pulmonary fibrosis: Secondary | ICD-10-CM

## 2018-10-16 DIAGNOSIS — J9611 Chronic respiratory failure with hypoxia: Secondary | ICD-10-CM

## 2018-10-16 DIAGNOSIS — R627 Adult failure to thrive: Secondary | ICD-10-CM

## 2018-10-16 MED ORDER — HYDROCODONE-ACETAMINOPHEN 5-325 MG PO TABS: 1.0000 | ORAL_TABLET | Freq: Four times a day (QID) | ORAL | 0 refills | Status: DC | PRN

## 2018-10-16 NOTE — Telephone Encounter (Signed)
Unsure of the conversation at [place] but Hydrocodone was refilled.  Hydrocodone last filled 09/20/18 #20 LOV: 10/12/18 Hosp follow up  Please advise

## 2018-10-16 NOTE — Telephone Encounter (Signed)
I have placed an urgent referral to Hospice for them.

## 2018-10-16 NOTE — Telephone Encounter (Signed)
Rx sent 

## 2018-10-16 NOTE — Telephone Encounter (Signed)
Copied from [place] [phone]. Topic: General - Other >> Oct 16, 2018  3:51 PM Mcneil, Ja-Kwan wrote: Reason for CRM: Dana Allan with [facility] stated pt and his wife are requesting hospice. Cb# [phone]

## 2018-10-16 NOTE — Telephone Encounter (Signed)
Pt wife called in stating that the Hydrocodone script needs be to sent [facility]. She states that it wasn't sent in on Friday.

## 2018-10-17 NOTE — Telephone Encounter (Signed)
I have faxed over the referral to [facility].

## 2018-10-26 ENCOUNTER — Other Ambulatory Visit: Payer: Self-pay

## 2018-10-26 ENCOUNTER — Emergency Department (HOSPITAL_COMMUNITY)

## 2018-10-26 ENCOUNTER — Emergency Department (HOSPITAL_COMMUNITY)
Admission: EM | Admit: 2018-10-26 | Discharge: 2018-10-26 | Disposition: A | Discharge status: Home / Self Care | Attending: Emergency Medicine | Admitting: Emergency Medicine

## 2018-10-26 ENCOUNTER — Encounter (HOSPITAL_COMMUNITY): Payer: Self-pay | Admitting: Emergency Medicine

## 2018-10-26 DIAGNOSIS — R0602 Shortness of breath: Secondary | ICD-10-CM | POA: Diagnosis not present

## 2018-10-26 DIAGNOSIS — Z87891 Personal history of nicotine dependence: Secondary | ICD-10-CM | POA: Insufficient documentation

## 2018-10-26 DIAGNOSIS — J8410 Pulmonary fibrosis, unspecified: Secondary | ICD-10-CM

## 2018-10-26 DIAGNOSIS — I10 Essential (primary) hypertension: Secondary | ICD-10-CM | POA: Insufficient documentation

## 2018-10-26 DIAGNOSIS — J209 Acute bronchitis, unspecified: Secondary | ICD-10-CM

## 2018-10-26 DIAGNOSIS — Z79899 Other long term (current) drug therapy: Secondary | ICD-10-CM | POA: Diagnosis not present

## 2018-10-26 DIAGNOSIS — R000 Tachycardia, unspecified: Secondary | ICD-10-CM | POA: Diagnosis not present

## 2018-10-26 DIAGNOSIS — R0902 Hypoxemia: Secondary | ICD-10-CM | POA: Diagnosis not present

## 2018-10-26 DIAGNOSIS — Z8546 Personal history of malignant neoplasm of prostate: Secondary | ICD-10-CM | POA: Diagnosis not present

## 2018-10-26 DIAGNOSIS — M069 Rheumatoid arthritis, unspecified: Secondary | ICD-10-CM | POA: Insufficient documentation

## 2018-10-26 DIAGNOSIS — Z743 Need for continuous supervision: Secondary | ICD-10-CM | POA: Diagnosis not present

## 2018-10-26 DIAGNOSIS — Z20828 Contact with and (suspected) exposure to other viral communicable diseases: Secondary | ICD-10-CM | POA: Diagnosis not present

## 2018-10-26 DIAGNOSIS — R069 Unspecified abnormalities of breathing: Secondary | ICD-10-CM | POA: Diagnosis not present

## 2018-10-26 LAB — BLOOD GAS, VENOUS
Acid-Base Excess: 7.6 mmol/L — ABNORMAL HIGH (ref 0.0–2.0)
Bicarbonate: 29.8 mmol/L — ABNORMAL HIGH (ref 20.0–28.0)
FIO2: 40
O2 Saturation: 46.1 %
Patient temperature: 37
pCO2, Ven: 46 mmHg (ref 44.0–60.0)
pH, Ven: 7.454 — ABNORMAL HIGH (ref 7.250–7.430)
pO2, Ven: <31 mmHg — CL (ref 32.0–45.0)

## 2018-10-26 LAB — CBC WITH DIFFERENTIAL/PLATELET
Abs Immature Granulocytes: 0.29 10*3/uL — ABNORMAL HIGH (ref 0.00–0.07)
Basophils Absolute: 0.1 10*3/uL (ref 0.0–0.1)
Basophils Relative: 0 %
Eosinophils Absolute: 0.6 10*3/uL — ABNORMAL HIGH (ref 0.0–0.5)
Eosinophils Relative: 3 %
HCT: 33.9 % — ABNORMAL LOW (ref 39.0–52.0)
Hemoglobin: 10.7 g/dL — ABNORMAL LOW (ref 13.0–17.0)
Immature Granulocytes: 1 %
Lymphocytes Relative: 16 %
Lymphs Abs: 3.5 10*3/uL (ref 0.7–4.0)
MCH: 27.7 pg (ref 26.0–34.0)
MCHC: 31.6 g/dL (ref 30.0–36.0)
MCV: 87.8 fL (ref 80.0–100.0)
Monocytes Absolute: 0.8 10*3/uL (ref 0.1–1.0)
Monocytes Relative: 4 %
Neutro Abs: 16.7 10*3/uL — ABNORMAL HIGH (ref 1.7–7.7)
Neutrophils Relative %: 76 %
Platelets: 389 10*3/uL (ref 150–400)
RBC: 3.86 MIL/uL — ABNORMAL LOW (ref 4.22–5.81)
RDW: 20.6 % — ABNORMAL HIGH (ref 11.5–15.5)
WBC: 22 10*3/uL — ABNORMAL HIGH (ref 4.0–10.5)
nRBC: 0.1 % (ref 0.0–0.2)

## 2018-10-26 LAB — SARS CORONAVIRUS 2 BY RT PCR (HOSPITAL ORDER, PERFORMED IN ~~LOC~~ HOSPITAL LAB): SARS Coronavirus 2: NEGATIVE

## 2018-10-26 LAB — BASIC METABOLIC PANEL
Anion gap: 12 (ref 5–15)
BUN: 14 mg/dL (ref 8–23)
CO2: 28 mmol/L (ref 22–32)
Calcium: 8.5 mg/dL — ABNORMAL LOW (ref 8.9–10.3)
Chloride: 96 mmol/L — ABNORMAL LOW (ref 98–111)
Creatinine, Ser: 1.01 mg/dL (ref 0.61–1.24)
GFR calc Af Amer: >60 mL/min (ref >60)
GFR calc non Af Amer: >60 mL/min (ref >60)
Glucose, Bld: 94 mg/dL (ref 70–99)
Potassium: 3.5 mmol/L (ref 3.5–5.1)
Sodium: 136 mmol/L (ref 135–145)

## 2018-10-26 LAB — BRAIN NATRIURETIC PEPTIDE: B Natriuretic Peptide: 591 pg/mL — ABNORMAL HIGH (ref 0.0–100.0)

## 2018-10-26 MED ORDER — LEVOFLOXACIN 750 MG PO TABS: 750.0000 mg | ORAL_TABLET | Freq: Every day | ORAL | 0 refills | Status: AC

## 2018-10-26 NOTE — ED Notes (Signed)
[facility] arrived to transport patient. Patients wife contacted and advised [facility] is on their way to transport patient back home.

## 2018-10-26 NOTE — ED Notes (Signed)
Patient waiting on EMS transport. Patient on continuous oxygen at 5 liters. Family contacted and patient does not have a portable unit. Patient is on 5 liters of oxygen at home per wife.
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
## 2018-10-26 NOTE — Discharge Instructions (Signed)
The testing today does not show any serious problem.  Your white blood cell count is elevated and you likely have bronchitis causing your discomfort.  We are prescribing an antibiotic to improve your condition.  Continue to use your other medications, including oxygen to help your breathing.  It is very important to follow-up with the pulmonary doctor as soon as possible.  Please call the pulmonary doctor that your PCP referred you to, on Monday morning.  If your condition worsens do not hesitate to return here for further treatment.
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
## 2018-11-30 ENCOUNTER — Telehealth: Payer: Self-pay | Admitting: Physician Assistant

## 2018-11-30 NOTE — Telephone Encounter (Signed)
Caller name: Rosselle,Linda Relation to pt: sister  Call back number: [phone]    Reason for call:  Amy from [facility] advised [name] (sister) that PCP declined to be attending physician for hospice. Sister states she doesn't believe PCP declined and would like to discuss, please follow up with sister on  on Monday.

## 2018-12-04 NOTE — Telephone Encounter (Signed)
Made Joel Valdez aware she will have them faxed over in the next day or two.

## 2018-12-04 NOTE — Telephone Encounter (Signed)
Ok to have them send [place] the orders. I can sign and have Dr. 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

## 2018-12-04 NOTE — Telephone Encounter (Signed)
Ann-Marie from [facility] care called in stating that pt passed away yesterday. She stated that on all the intake pw the family listed Einar Pheasant as the attending physician and before the pw could be changed the pt passed. She wanted to talk with Einar Pheasant to see if he would be willing to sign the original orders send them back. Ann-Marie can be reached at [phone].

## 2018-12-25 NOTE — Telephone Encounter (Signed)
Please call Joel Valdez (on [facility]). 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 This is why he was receiving care from an attending at the original hospice he was set up with. Is there a reason why they decided to change ?

## 2018-12-25 NOTE — Telephone Encounter (Signed)
Spoke with Izora Gala at [facility]. She states patient arrived over the weekend from the [place] [facility]. His current attending MD is Dr Huntley Dec.  She stated there was no need for PCP to take over care

## 2018-12-25 NOTE — Telephone Encounter (Signed)
Graciano is in [facility] went in yesterday. Advised that due to Elyn Aquas being a PA, he is unable  [name] patient wife was not able to handle the care with Dominica Severin. Khamari has been living with [name] (sister) for the past month. He was not getting enough oxygen to his brain, becoming incoherent. The decision was made to place [place] into Hospice. He was receiving care from [facility] prior at the home.  She spoke with Amy at [facility]. They needed an attending doctor. Right now the Hospice MD is managing his medications and care.  I will call [facility] to get more information.   [facility] Contact: Amy

## 2018-12-25 DEATH — deceased

## 2019-10-26 IMAGING — CT CT ANGIOGRAPHY CHEST
1 of 8 series · 3 of 16 positions shown · IV contrast (omnipaque)
Comparison: 08/09/2018 from [HOSPITAL]

CLINICAL DATA: Chest pain and shortness of breath. Elevated
D-dimer. High clinical probability for pulmonary embolism.

EXAM:
CT ANGIOGRAPHY CHEST WITH CONTRAST
TECHNIQUE: Multidetector CT imaging of the chest was performed using the
standard protocol during bolus administration of intravenous
contrast. Multiplanar CT image reconstructions and MIPs were
obtained to evaluate the vascular anatomy.
CONTRAST:  100mL OMNIPAQUE IOHEXOL 350 MG/ML SOLN

[Series 6: pe thins · axial · 0.72mm/px · z∈[-401,-119]mm · 3 of 283 slices shown]
[im 1/283  lung]
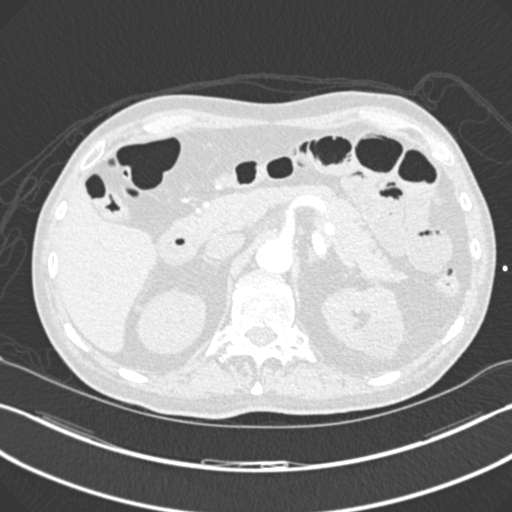
[im 142/283  soft-tissue]
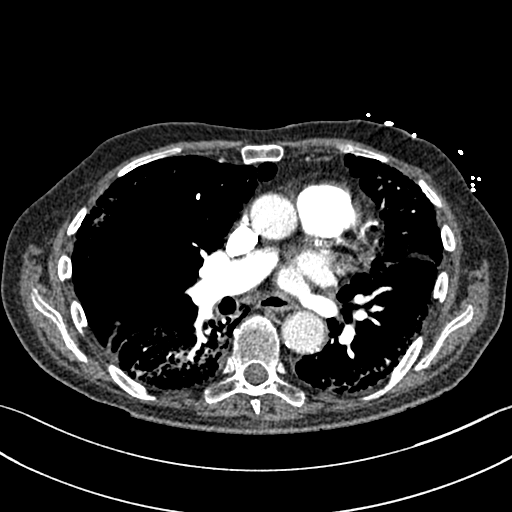
[im 283/283  lung]
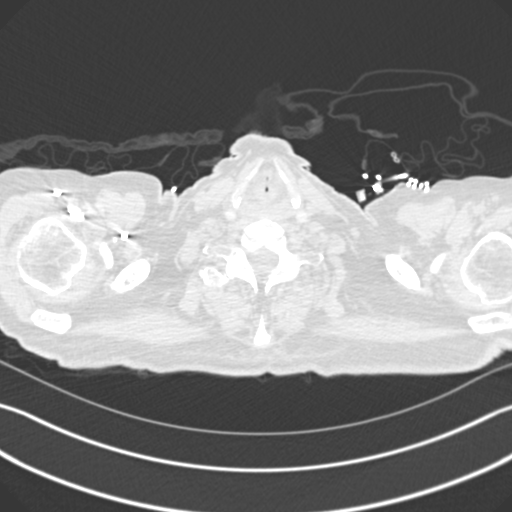

[3 of 16 positions shown; findings below may reference images not displayed]

FINDINGS: Cardiovascular: Satisfactory opacification of pulmonary arteries
noted, and no pulmonary emboli identified. No evidence of thoracic
aortic dissection or aneurysm. Mild cardiomegaly. Aortic and
coronary artery atherosclerosis.

Mediastinum/Nodes: No masses or pathologically enlarged lymph nodes
identified.

Lungs/Pleura: Severe chronic pulmonary interstitial fibrosis again
seen with honeycombing and traction bronchiectasis which is most
severe in the left upper lobe. Previously seen areas of parenchymal
consolidation in the right mid and lower lung are decreased since
previous study, consistent with resolving superimposed infectious or
inflammatory process. No new or worsening areas of pulmonary opacity
are identified. No evidence of pneumothorax or pleural effusion.

Upper abdomen: No acute findings.

Musculoskeletal: No suspicious bone lesions identified.

Review of the MIP images confirms the above findings.
IMPRESSION: 1. No evidence of pulmonary embolism or other acute findings.
2. Severe chronic pulmonary interstitial fibrosis.
3. Resolving right mid and lower lung airspace opacity, consistent
with resolving superimposed infectious or inflammatory process.
4. Aortic and coronary artery atherosclerosis.
